# Patient Record
Sex: Female | Born: 1975 | Race: Black or African American | Hispanic: No | Smoking: Never smoker
Health system: Southern US, Community
[De-identification: ages and names within clinical notes are randomized; demographics above are authoritative.]

## PROBLEM LIST (undated history)

## (undated) ENCOUNTER — Inpatient Hospital Stay (HOSPITAL_COMMUNITY): Admission: AD | Payer: Medicare HMO | Source: Home / Self Care | Admitting: Psychiatry

## (undated) DIAGNOSIS — R1310 Dysphagia, unspecified: Secondary | ICD-10-CM

## (undated) DIAGNOSIS — E119 Type 2 diabetes mellitus without complications: Secondary | ICD-10-CM

## (undated) DIAGNOSIS — F319 Bipolar disorder, unspecified: Secondary | ICD-10-CM

## (undated) DIAGNOSIS — K219 Gastro-esophageal reflux disease without esophagitis: Secondary | ICD-10-CM

## (undated) DIAGNOSIS — IMO0002 Reserved for concepts with insufficient information to code with codable children: Secondary | ICD-10-CM

## (undated) DIAGNOSIS — R7303 Prediabetes: Secondary | ICD-10-CM

## (undated) DIAGNOSIS — F419 Anxiety disorder, unspecified: Secondary | ICD-10-CM

## (undated) DIAGNOSIS — R87619 Unspecified abnormal cytological findings in specimens from cervix uteri: Secondary | ICD-10-CM

## (undated) DIAGNOSIS — F209 Schizophrenia, unspecified: Secondary | ICD-10-CM

## (undated) DIAGNOSIS — M1990 Unspecified osteoarthritis, unspecified site: Secondary | ICD-10-CM

## (undated) HISTORY — DX: Bipolar disorder, unspecified: F31.9

## (undated) HISTORY — DX: Unspecified abnormal cytological findings in specimens from cervix uteri: R87.619

## (undated) HISTORY — DX: Schizophrenia, unspecified: F20.9

## (undated) HISTORY — DX: Dysphagia, unspecified: R13.10

## (undated) HISTORY — DX: Gastro-esophageal reflux disease without esophagitis: K21.9

## (undated) HISTORY — DX: Prediabetes: R73.03

## (undated) HISTORY — DX: Reserved for concepts with insufficient information to code with codable children: IMO0002

## (undated) HISTORY — DX: Type 2 diabetes mellitus without complications: E11.9

## (undated) HISTORY — DX: Anxiety disorder, unspecified: F41.9

## (undated) HISTORY — DX: Unspecified osteoarthritis, unspecified site: M19.90

---

## 1998-12-13 ENCOUNTER — Emergency Department (HOSPITAL_COMMUNITY): Admission: EM | Admit: 1998-12-13 | Discharge: 1998-12-14 | Payer: Self-pay | Admitting: Emergency Medicine

## 1998-12-14 ENCOUNTER — Encounter: Payer: Self-pay | Admitting: Emergency Medicine

## 1999-02-24 ENCOUNTER — Inpatient Hospital Stay (HOSPITAL_COMMUNITY): Admission: EM | Admit: 1999-02-24 | Discharge: 1999-03-12 | Payer: Self-pay | Admitting: *Deleted

## 1999-03-13 ENCOUNTER — Other Ambulatory Visit: Admission: RE | Admit: 1999-03-13 | Discharge: 1999-04-10 | Payer: Self-pay

## 2000-12-24 ENCOUNTER — Encounter: Payer: Self-pay | Admitting: *Deleted

## 2000-12-24 ENCOUNTER — Encounter: Admission: RE | Admit: 2000-12-24 | Discharge: 2000-12-24 | Payer: Self-pay | Admitting: *Deleted

## 2001-01-06 ENCOUNTER — Inpatient Hospital Stay (HOSPITAL_COMMUNITY): Admission: EM | Admit: 2001-01-06 | Discharge: 2001-01-09 | Payer: Self-pay | Admitting: *Deleted

## 2001-01-06 ENCOUNTER — Encounter: Payer: Self-pay | Admitting: Emergency Medicine

## 2001-01-10 ENCOUNTER — Other Ambulatory Visit (HOSPITAL_COMMUNITY): Admission: RE | Admit: 2001-01-10 | Discharge: 2001-01-24 | Payer: Self-pay | Admitting: Psychiatry

## 2001-01-14 ENCOUNTER — Ambulatory Visit (HOSPITAL_COMMUNITY): Admission: RE | Admit: 2001-01-14 | Discharge: 2001-01-14 | Payer: Self-pay | Admitting: Family Medicine

## 2001-01-19 ENCOUNTER — Encounter: Admission: RE | Admit: 2001-01-19 | Discharge: 2001-04-19 | Payer: Self-pay

## 2001-02-03 ENCOUNTER — Encounter: Admission: RE | Admit: 2001-02-03 | Discharge: 2001-02-03 | Payer: Self-pay | Admitting: *Deleted

## 2001-02-18 ENCOUNTER — Encounter: Admission: RE | Admit: 2001-02-18 | Discharge: 2001-02-18 | Payer: Self-pay | Admitting: *Deleted

## 2001-02-25 ENCOUNTER — Encounter: Admission: RE | Admit: 2001-02-25 | Discharge: 2001-02-25 | Payer: Self-pay | Admitting: *Deleted

## 2001-03-07 ENCOUNTER — Encounter: Admission: RE | Admit: 2001-03-07 | Discharge: 2001-03-07 | Payer: Self-pay | Admitting: *Deleted

## 2001-03-08 ENCOUNTER — Encounter: Admission: RE | Admit: 2001-03-08 | Discharge: 2001-03-08 | Payer: Self-pay | Admitting: *Deleted

## 2001-03-22 ENCOUNTER — Encounter: Admission: RE | Admit: 2001-03-22 | Discharge: 2001-03-22 | Payer: Self-pay | Admitting: *Deleted

## 2001-03-29 ENCOUNTER — Encounter: Admission: RE | Admit: 2001-03-29 | Discharge: 2001-03-29 | Payer: Self-pay | Admitting: *Deleted

## 2001-04-06 ENCOUNTER — Encounter: Admission: RE | Admit: 2001-04-06 | Discharge: 2001-04-06 | Payer: Self-pay | Admitting: *Deleted

## 2001-04-18 ENCOUNTER — Encounter: Admission: RE | Admit: 2001-04-18 | Discharge: 2001-04-18 | Payer: Self-pay | Admitting: *Deleted

## 2001-04-21 ENCOUNTER — Encounter: Admission: RE | Admit: 2001-04-21 | Discharge: 2001-04-21 | Payer: Self-pay | Admitting: *Deleted

## 2001-04-26 ENCOUNTER — Encounter: Admission: RE | Admit: 2001-04-26 | Discharge: 2001-04-26 | Payer: Self-pay | Admitting: *Deleted

## 2001-05-04 ENCOUNTER — Ambulatory Visit (HOSPITAL_COMMUNITY): Admission: RE | Admit: 2001-05-04 | Discharge: 2001-05-04 | Payer: Self-pay | Admitting: *Deleted

## 2001-05-11 ENCOUNTER — Encounter: Admission: RE | Admit: 2001-05-11 | Discharge: 2001-05-11 | Payer: Self-pay | Admitting: *Deleted

## 2001-05-19 ENCOUNTER — Encounter: Admission: RE | Admit: 2001-05-19 | Discharge: 2001-05-19 | Payer: Self-pay | Admitting: *Deleted

## 2001-05-31 ENCOUNTER — Encounter: Admission: RE | Admit: 2001-05-31 | Discharge: 2001-05-31 | Payer: Self-pay | Admitting: *Deleted

## 2001-06-03 ENCOUNTER — Encounter: Admission: RE | Admit: 2001-06-03 | Discharge: 2001-06-03 | Payer: Self-pay | Admitting: *Deleted

## 2001-06-21 ENCOUNTER — Encounter: Admission: RE | Admit: 2001-06-21 | Discharge: 2001-06-21 | Payer: Self-pay | Admitting: *Deleted

## 2001-06-22 ENCOUNTER — Inpatient Hospital Stay (HOSPITAL_COMMUNITY): Admission: EM | Admit: 2001-06-22 | Discharge: 2001-06-29 | Payer: Self-pay | Admitting: Psychiatry

## 2001-06-30 ENCOUNTER — Other Ambulatory Visit (HOSPITAL_COMMUNITY): Admission: RE | Admit: 2001-06-30 | Discharge: 2001-07-08 | Payer: Self-pay | Admitting: Psychiatry

## 2001-07-14 ENCOUNTER — Encounter: Admission: RE | Admit: 2001-07-14 | Discharge: 2001-07-14 | Payer: Self-pay | Admitting: Psychiatry

## 2001-07-22 ENCOUNTER — Encounter: Admission: RE | Admit: 2001-07-22 | Discharge: 2001-07-22 | Payer: Self-pay | Admitting: Psychiatry

## 2001-08-16 ENCOUNTER — Encounter: Admission: RE | Admit: 2001-08-16 | Discharge: 2001-08-16 | Payer: Self-pay | Admitting: Psychiatry

## 2001-08-19 ENCOUNTER — Encounter: Admission: RE | Admit: 2001-08-19 | Discharge: 2001-08-19 | Payer: Self-pay | Admitting: Psychiatry

## 2001-08-30 ENCOUNTER — Encounter: Admission: RE | Admit: 2001-08-30 | Discharge: 2001-08-31 | Payer: Self-pay

## 2001-11-21 ENCOUNTER — Encounter: Admission: RE | Admit: 2001-11-21 | Discharge: 2001-11-21 | Payer: Self-pay | Admitting: Psychiatry

## 2001-12-19 ENCOUNTER — Encounter: Admission: RE | Admit: 2001-12-19 | Discharge: 2001-12-19 | Payer: Self-pay | Admitting: Psychiatry

## 2001-12-22 ENCOUNTER — Encounter: Admission: RE | Admit: 2001-12-22 | Discharge: 2001-12-22 | Payer: Self-pay | Admitting: Psychiatry

## 2002-01-23 ENCOUNTER — Encounter: Admission: RE | Admit: 2002-01-23 | Discharge: 2002-01-23 | Payer: Self-pay | Admitting: Psychiatry

## 2002-03-01 ENCOUNTER — Encounter: Admission: RE | Admit: 2002-03-01 | Discharge: 2002-03-01 | Payer: Self-pay | Admitting: *Deleted

## 2003-02-22 ENCOUNTER — Other Ambulatory Visit: Admission: RE | Admit: 2003-02-22 | Discharge: 2003-02-22 | Payer: Self-pay | Admitting: Family Medicine

## 2003-03-17 DIAGNOSIS — R87612 Low grade squamous intraepithelial lesion on cytologic smear of cervix (LGSIL): Secondary | ICD-10-CM | POA: Insufficient documentation

## 2003-08-02 ENCOUNTER — Other Ambulatory Visit: Admission: RE | Admit: 2003-08-02 | Discharge: 2003-08-02 | Payer: Self-pay | Admitting: Obstetrics and Gynecology

## 2003-10-25 ENCOUNTER — Other Ambulatory Visit: Admission: RE | Admit: 2003-10-25 | Discharge: 2003-10-25 | Payer: Self-pay | Admitting: Obstetrics and Gynecology

## 2004-02-14 ENCOUNTER — Other Ambulatory Visit: Admission: RE | Admit: 2004-02-14 | Discharge: 2004-02-14 | Payer: Self-pay | Admitting: Obstetrics and Gynecology

## 2004-07-23 ENCOUNTER — Other Ambulatory Visit: Admission: RE | Admit: 2004-07-23 | Discharge: 2004-07-23 | Payer: Self-pay | Admitting: Obstetrics and Gynecology

## 2005-10-14 ENCOUNTER — Other Ambulatory Visit: Admission: RE | Admit: 2005-10-14 | Discharge: 2005-10-14 | Payer: Self-pay | Admitting: Obstetrics and Gynecology

## 2009-03-13 ENCOUNTER — Ambulatory Visit (HOSPITAL_COMMUNITY): Admission: RE | Admit: 2009-03-13 | Discharge: 2009-03-13 | Payer: Self-pay | Admitting: Psychiatry

## 2009-03-22 DIAGNOSIS — K219 Gastro-esophageal reflux disease without esophagitis: Secondary | ICD-10-CM | POA: Insufficient documentation

## 2009-03-22 DIAGNOSIS — F319 Bipolar disorder, unspecified: Secondary | ICD-10-CM | POA: Insufficient documentation

## 2010-08-01 NOTE — H&P (Signed)
[facility]  Patient:    Brenda Hernandez, Brenda Hernandez Visit Number: [id number] MRN: [id number]          Service Type: PSY Location: 400 [id number] 01 Attending Physician:  Jeanice Lim Dictated by:   Young Berry Scott, N.P. Admit Date:  06/22/2001                     Psychiatric Admission Assessment  DATE OF ADMISSION:  June 22, 2001.  IDENTIFYING INFORMATION:  This is a 35 year old African-American female who is a voluntary admission.  HISTORY OF THE PRESENT ILLNESS:  This patient was referred by her psychiatrist for disorganized and hyperverbal speech with increased physical activity, after outpatient treatment was ineffective in controlling her symptoms.  The patient apparently had an episode of depression, as reported by her parents, around mid-March, followed by escalation into mania around April 1, that had increasing difficulty over the past 10 days redirecting the patients behavior.  She has had symptoms of flight of ideas, hyperverbal speech, and has been pacing at home.  Today she states "I have to get out of here to meet with the President of [facility]," and "I cant eat any food here because I have GERD."  The patients history is primarily taken from the record and from  her past medical records here, and from her father who is available for some information.  PAST PSYCHIATRIC HISTORY:  The patient is currently followed by Dr. Lourdes Sledge and Dr. Ladona Ridgel, and by Abel Presto in the [facility].  This is her third admission to [facility].  The patient reports a history of bipolar illness, diagnosed while she was at [employer], in her 3rd year, after some emotional traumas which are unclear.  SOCIAL HISTORY:  The patient was educated at [employer], also attended [employer] for 3 years.  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  She lives with her parents, Victorino Dike  and Electa Sniff, at home.  The patient has a baccalaureate in psychology and was valedictorian of her class.  FAMILY HISTORY:  Remarkable for an aunt with bipolar illness.  ALCOHOL AND DRUG HISTORY:  Unclear.  Family denied any substance abuse on admission and in the past drug screens have been negative.  PAST MEDICAL HISTORY:  The patient is followed by Dr. Parke Simmers, who is her primary care physician.  Medical problems include GERD, and the patient reports a history of dysphagia with some difficulty of swallowing pills and requiring liquid or granulated medications.  MEDICATIONS:  Depakene liquid 250 mg q.a.m., 250 mg q.3p., and 1000 mg at h.s., Prevacid granules 15 mg daily, and Zyprexa Zydis 15 mg at h.s.  The patient was also taking some Seroquel and Xanax apparently at home, and that is unclear at this time.  DRUG ALLERGIES:  None.  POSITIVE PHYSICAL FINDINGS:  The patients review of systems is remarkable for some complaints of discomfort and stiffness in her ankles and feet.  There is some history of her pacing and walking quite a bit over the past 10 days, and she reports a history of shin splints.  She also has had some difficulty swallowing medications and at times complains of inability to eat the food here because she has a history of GERD.  Her intake has been variable while here.  PHYSICAL EXAMINATION:  This is a healthy-appearing African-American female who is of medium build.  She is 5 feet 4 inches  tall, weighs 130 pounds.  Vital signs:  Temp 98.4, pulse 104, respirations 16, blood pressure 137/81.  She is relaxed and cooperative for the exam.  HEAD:  Normocephalic.  EENT:  PERRLA.  Hearing is intact to normal voice.  No rhinorrhea.  Oropharynx:  The patient will not allow detailed exam but there is no breath odor.  She has no complaints.  NECK:  Supple, with no evidence of thyromegaly.  Difficult to palpate any nodes.  The patient will not allow much  exam.  CARDIOVASCULAR:  S1 and S2 are heard.  No clicks, murmurs, or gallops. Regular rate.  LUNGS:  clear to auscultation.  ABDOMEN:  Soft, with no guarding, no evidence of tenderness, no masses appreciated.  GENITALIA:  Deferred.  BREAST EXAM:  Deferred.  MUSCULOSKELETAL:  Strength is 5/5 throughout.  Gait is normal.  Motor movements are smooth.  No evidence of inflammation or tenderness at any joints.  NEURO:  The patient will not cooperate with a full neuro exam.  Gait is grossly normal.  Motor movements are smooth without tremor.  Sensory seems grossly intact.  Her balance is good.  She will not cooperate with a Romberg or sit still for reflexes.  MENTAL STATUS EXAMINATION:  This is a healthy-appearing African-American female, who is fully alert.  She is attempting to cooperate but is unable to stay still.  She paces slowly and shows some evidence of motor retardation. Her speech is generally soft in tone and is hyperverbal in nature.  Mood is guarded and suspicious.  Thought process is fragmented, with some grandiose thoughts.  She is quite disorganized, having flight of ideas.  Some mild paranoia but seems to be transient.  Her mood varies a bit, being guarded one minute and then is more relaxed and open the next.  She does not seem to be overtly suicidal or homicidal, does not appear to be responding to any hallucinations.  Cognitively, she is intact to person or place.  She is confused about time of day or date.  Her judgment and impulse control are impaired.  Insight is poor.  Intelligence is above average.  ADMISSION DIAGNOSES: Axis I:    Bipolar, manic. Axis II:   None. Axis III:  Dysphagia secondary to gastroesophageal reflux disease. Axis IV:   Deferred at this time.  Supportive parents are definitely an            asset to her. Axis V:    Current 29, past year 80.  INITIAL PLAN OF CARE:  Voluntarily admit the patient to stabilize her mood, with q.15 minute  checks in place, and she is under close observation on the 400 unit.  We have elected to give her Zydis 15 mg p.o. q.h.s., which started  a couple of days ago in outpatient clinic, and we will continue those other meds also:  Depakene liquid 250 mg q.a.m., 3 p.m., and 1000 mg at h.s., Prevacid granules daily 15 mg.  Her UA and urine pregnancy test are currently pending, and we will request her outpatient record to check the last time she had a thyroid panel done, since she has does have some mild tachycardia at 104.  The patients last Depakote level was 92.2, and we will repeat a Depakote level in two days.  ESTIMATED LENGTH OF STAY:  Six days. Dictated by:   Young Berry Scott, N.P. Attending Physician:  Jeanice Lim DD:  06/23/01 TD:  06/23/01 Job: [id number]

## 2010-08-01 NOTE — H&P (Signed)
[facility]  Patient:    Brenda Hernandez, Brenda Hernandez Visit Number: [id number] MRN: [id number]          Service Type: PSY Location: 400 [id number] 02 Attending Physician:  Jasmine Pang Dictated by:   Candi Leash. Orsini, N.P. Admit Date:  01/06/2001                     Psychiatric Admission Assessment  DATE OF ADMISSION:  January 06, 2001  PATIENT IDENTIFICATION:  This is a 35 year old single African-American female voluntarily admitted on January 06, 2001, for delusions and paranoia.  HISTORY OF PRESENT ILLNESS:  The patient presents with a history of inability to swallow.  The patient has lost 25-30 pounds as she states she is unable to eat or drink.  The patient also reports that her heart has been racing with associated chest pain.  She has had an extensive workup with a normal EKG. The patient also feels that she has HIV although her reports are negative. She is uncertain about taking medications as they will stick in her throat. She denies any depression and anxiety, denies any suicidal or homicidal ideations.  She sleeps well with Ambien but she feels that it is "too addicting."  She has lost over 25 pounds since May, has been decompensating for the past two to three weeks.  She denies any psychotic symptoms, denies any paranoid ideations.  She does promise safety.  She reports no significant stressors.  PAST PSYCHIATRIC HISTORY:  This is the second hospitalization to [facility].  She sees Dr. Elna Breslow, has been his patient for the past four to six months.  SUBSTANCE ABUSE HISTORY:  She is a nonsmoker, nondrinker, denies any substance abuse.  PAST MEDICAL HISTORY:  Primary care Zena Vitelli: Dr. Parke Simmers.  Medical problems: GERD.  MEDICATIONS PRIOR TO ADMISSION: 1. Claritin. 2. Effexor XR 37.5 mg. 3. Ambien 5 mg at h.s.  DRUG ALLERGIES:  No known allergies.  PHYSICAL EXAMINATION:  VITAL SIGNS:  Temperature 99.4, pulse 120, respirations  18, blood pressure 148/79.  GENERAL:  The patient presents as a thin African-American female.  Weight is 100 pounds.  She is 5 feet 4 inches.  The patient is a 35 year old in no acute distress.  She appears her stated age.  She is well-groomed, alert, and cooperative.  HEENT:  Head is normocephalic.   Hair is short, black, and equally distributed.  External ear canals are patent.  No sinus tenderness.  Hearing is appropriate to conversation.  Mouth: Mucosa is moist.  She has good dentition.  No lesions were seen.  NECK:  Supple.  There is no JVD, negative lymphadenopathy.  Thyroid is nonpalpable and nontender.  Trachea is midline.  CHEST:  Clear to auscultation, no adventitious sounds, no cough.  CARDIOVASCULAR:  The patient is tachycardic at a heart rate of 110 with no murmurs, gallops, or rubs.  BREAST:  Exam was deferred.  ABDOMEN:  Soft, nontender abdomen, no CVA tenderness.  MUSCULOSKELETAL:  No joint swelling or deformity.  Good range of motion.  No sign of injury.  SKIN:  Warm and dry with good turgor.  Nail beds are pink with good capillary refill.  NEUROLOGIC:  Oriented x 3.  Gait is slow but normal.  LABORATORY DATA:  RBC is low at 3.83.  Urine pregnancy test was negative. Urine drug screen was negative.  Streptococcus screen was negative.  SOCIAL HISTORY:  She is a 35 year old single African-American female with no children.  She lives with her parents.  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  She has completed college at [employer].  FAMILY HISTORY:  Aunt with bipolar.  MENTAL STATUS EXAMINATION:  She is an alert, young African-American female. She is cooperative and neat, pleasant, good eye contact.  She is suspicious and has psychomotor retardation.  Speech is soft spoken, slow, and hesitant. Mood is pleasant.  Affect is depressed with mild anxiety.  Thought processes: Positive delusions, positive paranoia.  There is some prominent  thought process of her medications and how they will affect her.  No auditory or visual hallucinations, no suicidal or homicidal ideations, no flight of ideas. Cognitive: Intact.  Memory is fair.  Decreased concentration.  Judgment is poor.  Insight is poor.  ADMISSION DIAGNOSES: Axis I:    Bipolar disorder, mixed with psychotic features. Axis II:   Deferred. Axis III:  None Axis IV:   Deferred. Axis V:    Current is 30, estimated this past year is 65-70.  INITIAL PLAN OF CARE:  Plan is a voluntary admission to [facility] for psychosis, delusions, paranoia.  Contract for safety.  Check every 15 minutes.  Will initiate Depakote liquid Zyprexa Zydis, and Ativan on a scheduled doses.  Will check labs, her Depakote level, and electrolytes.  Will monitor her nutrition and put the patient on I&O.  Will obtain an EKG from Dr. Parke Simmers with the patients permission.  Our goal is to stabilize mood and thinking, to follow up with Dr. Katrinka Blazing, and for the patient to be medication compliant.  ESTIMATED LENGTH OF STAY:  Five days or more depending on the patients response to medications. Dictated by:   Candi Leash. Orsini, N.P. Attending Physician:  Jasmine Pang DD:  01/07/01 TD:  01/07/01 Job: [id number]

## 2010-08-01 NOTE — Discharge Summary (Signed)
[facility]  Patient:    Brenda Hernandez, Brenda Hernandez Visit Number: [id number] MRN: [id number]          Service Type: PSY Location: PIOP Attending Physician:  Benjaman Pott Dictated by:   Jeanice Lim, M.D. Admit Date:  01/10/2001 Discharge Date: 01/24/2001                             Discharge Summary  IDENTIFYING DATA:  This is a 35 year old single African-American female, voluntarily admitted for delusions and paranoia, complaining of difficulty swallowing, having lost 20-25 pounds.  Also complaining of her heart racing, despite a normal EKG, and fear that she may have HIV as well as some other serious medical problem.  ADMISSION MEDICATIONS:  The patient had been prescribed Geodon, Claritin, Effexor XR and Ambien.  ALLERGIES:  No known drug allergies.  MENTAL STATUS EXAMINATION:  The patient was an alert, young African-American female, mostly cooperative, guarded, mildly psychomotor retarded.  Speech was soft spoken and slow and halting.  Mood was suspicious, affect blunted with mild anxiety.  Thought process goal directed.  Thought content positive for somatic delusions, paranoia, believing that her symptoms may have been due to past medications that she had been on, including medicines she took years ago. She was cognitively intact, with poor concentration, and judgment and insight were markedly impaired.  ADMISSION DIAGNOSES: Axis I:    Bipolar disorder, mixed, with psychotic features. Axis II:   None. Axis III:  None, except for recent weight loss. Axis IV:   Mild. Axis V:    35/65-70.  LABS:  Routine admission labs were essentially within normal limits.  EKG was within normal limits.  She was HIV negative, and comprehensive metabolic panel was within normal limits, showing no severe dehydration or electrolyte disturbance.  HOSPITAL COURSE:  The patient was admitted with routine PRN medications.  A meeting was held with her father who  was quite involved, making an effort to be supportive and to assist the patient in communicating her fears regarding treatment and what she was experiencing.  The patient was started Effexor XR 37.5 mg, Ativan 0.5 q.6 p.r.n., Nexium, Rhinall nasal spray.  She was given Ativan 1.0 mg at the time of admission to evaluate the effects.  The patient began talking, no longer appeared to be quite as thought disordered, and ate without difficulty.  Ativan was scheduled at 0.5 t.i.d., Depakene 250 mg now and 500 q.h.s.  Medicines all given in liquid form to be supportive of the patients complaint of difficulty swallowing, and Prevacid 30 mg p.o. b.i.d. liquid.  Zyprexa Zydis 5 mg q.h.s. was started, and patients weight was monitored, as well as her ins and outs.  Effexor XR was discontinued due to hypomanic-like symptoms at times which could be induced by Effexor, and Depakote optimized.  The patient showed a robust response to the medications, reporting no side effects and reported that she was sleeping and eating well, with increased energy and thinking more clearly.  The family was also very pleased with her response, and the aftercare plan that was developed.  CONDITION ON DISCHARGE:  Markedly improved.  Mood was mostly euthymic, affect brighter, thought process goal directed, without delay or hesitation, and thought content was negative for adverse psychotic symptoms.  She still had some beliefs regarding the reasons for her symptoms, including past medications causing them, despite the lack of evidence to support this, but had no suicidal, homicidal,  or violent ideation, and no hallucinations.  Her judgment and insight had improved.  Insight was still limited, although she did understand the importance of the medications and compliance with the followup plan.  She was discharged to follow up with Elna Breslow and a [facility] therapist, Dr. Ledon Snare, initially to start Intensive Outpatient for  close following due to the need for possible medication adjustments.  DISCHARGED WITH PRESCRIPTIONS FOR: 1. Zyprexa Zydis 5 mg q.h.s. 2. Ativan 0.5 mg 1/2 b.i.d. and 1 q.h.s. 3. Depakene syrup 15 ml q.h.s. which is 750 mg total. 4. Prevacid solution 10 ml bid, equivalent to 30 mg.  DISCHARGE DIAGNOSES: Axis I:    Bipolar disorder, mixed, with psychotic features. Axis II:   None. Axis III:  None, except for recent weight loss. Axis IV:   Mild. Axis V:    Global assessment of function on discharge was 50. Dictated by:   Jeanice Lim, M.D. Attending Physician:  Carolanne Grumbling D DD:  02/15/01 TD:  02/16/01 Job: [id number]

## 2010-08-01 NOTE — Discharge Summary (Signed)
[facility]  Patient:    Brenda Hernandez, Brenda Hernandez Visit Number: [id number] MRN: [id number]          Service Type: PSY Location: PIOP Attending Physician:  Benjaman Pott Dictated by:   Jeanice Lim, M.D. Admit Date:  06/30/2001 Disc. Date: 06/29/01                             Discharge Summary  IDENTIFYING DATA:  This is a 35 year old African-American female voluntarily admitted, referred by her psychiatrist, for disorganized thoughts, hyperverbal speech and hyperactivity.  MEDICATIONS:  Depakote, Zyprexa, Xanax, Seroquel, Prilosec.  ALLERGIES:  None.  PHYSICAL EXAMINATION:  Essentially within normal limits.  Neurologically nonfocal.  LABORATORY DATA:  Routine admission labs essentially within normal limits. Depakote level 92.2.  MENTAL STATUS EXAMINATION:  Healthy-appearing African-American female in no acute distress.  Cooperative.  Unable to sit still.  Hyperverbal with mildly pressured speech.  Soft tone.  Mood guarded and suspicious.  Thought process goal directed.  Thought content positive for grandiose thoughts, paranoid ideation, flight of ideas at times and no dangerous ideation.  Cognitively intact.  Judgment and insight limited.  ADMISSION DIAGNOSES: Axis I:    Bipolar disorder, type 1, manic. Axis II:   None. Axis III:  Dysphagia secondary to gastroesophageal reflux disease. Axis IV:   Moderate (problems with primary support). Axis V:    29/65.  HOSPITAL COURSE:  The patient was admitted and ordered routine p.r.n. medications and restarted on previous medications of Prevacid, Zyprexa, Depakene.  The patient was optimized on Zyprexa Zydis for stabilization of mood lability and Depakote level obtained.  Depakote was also titrated as tolerated.  A family session was held which went well.  The parents were considered supportive.  The patient was given liquid medicines due to reporting difficulty swallowing at times, which is a  chronic complaint of hers, and she was discharged to follow up with IOP.  CONDITION ON DISCHARGE:  Markedly improved.  Mood more stable and euthymic. Affect brighter.  Thought process goal directed.  Thought content negative for dangerous ideation or psychotic symptoms.  DISCHARGE MEDICATIONS: 1. Prevacid 15 mg, 5 mg q.a.m. 2. Zyprexa Zydis 10 mg, 2 q.h.s. 3. Depakene 250 mg q.a.m. and 1500 mg q.h.s.  FOLLOW-UP:  IOP downstairs on the day after discharge.  DISCHARGE DIAGNOSES: Axis I:    Bipolar disorder, type 1, manic. Axis II:   None. Axis III:  Dysphagia secondary to gastroesophageal reflux disease. Axis IV:   Moderate (problems with primary support). Axis V:    Global Assessment of Functioning on discharge 50. Dictated by:   Jeanice Lim, M.D. Attending Physician:  Carolanne Grumbling D DD:  07/27/01 TD:  07/28/01 Job: [id number]

## 2011-07-23 ENCOUNTER — Telehealth: Payer: Self-pay | Admitting: Obstetrics and Gynecology

## 2011-07-23 NOTE — Telephone Encounter (Signed)
VPH pt 

## 2011-07-23 NOTE — Telephone Encounter (Signed)
Triage/no return call

## 2011-07-24 NOTE — Telephone Encounter (Signed)
Lm on vm to cb per telephone call.  

## 2011-07-28 ENCOUNTER — Telehealth: Payer: Self-pay | Admitting: Obstetrics and Gynecology

## 2011-07-28 NOTE — Telephone Encounter (Signed)
No response from pt rgdg telephone call. Chart to fb 

## 2011-07-28 NOTE — Telephone Encounter (Signed)
chandra/epic 

## 2011-07-28 NOTE — Telephone Encounter (Signed)
Tc to pt per telephone call. Pt with several ?'s rgdg preconception. Informed pt needs eval to discuss. Pt voices understanding. Pt will call back to sched appt to discuss with vh.

## 2011-11-19 DIAGNOSIS — F429 Obsessive-compulsive disorder, unspecified: Secondary | ICD-10-CM | POA: Insufficient documentation

## 2011-11-23 DIAGNOSIS — I10 Essential (primary) hypertension: Secondary | ICD-10-CM | POA: Insufficient documentation

## 2011-11-23 DIAGNOSIS — E8881 Metabolic syndrome: Secondary | ICD-10-CM | POA: Insufficient documentation

## 2012-01-15 ENCOUNTER — Telehealth: Payer: Self-pay | Admitting: Obstetrics and Gynecology

## 2012-05-04 ENCOUNTER — Encounter: Payer: Self-pay | Admitting: Obstetrics and Gynecology

## 2012-05-04 ENCOUNTER — Ambulatory Visit: Payer: Medicare HMO | Admitting: Obstetrics and Gynecology

## 2012-05-04 VITALS — BP 110/72 | HR 112 | Ht 63.0 in | Wt 248.0 lb

## 2012-05-04 DIAGNOSIS — F319 Bipolar disorder, unspecified: Secondary | ICD-10-CM

## 2012-05-04 DIAGNOSIS — Z124 Encounter for screening for malignant neoplasm of cervix: Secondary | ICD-10-CM

## 2012-05-04 DIAGNOSIS — Z01419 Encounter for gynecological examination (general) (routine) without abnormal findings: Secondary | ICD-10-CM

## 2012-05-04 DIAGNOSIS — N912 Amenorrhea, unspecified: Secondary | ICD-10-CM

## 2012-05-04 LAB — POCT URINE PREGNANCY: Preg Test, Ur: NEGATIVE

## 2012-05-04 MED ORDER — LEVONORGESTREL-ETHINYL ESTRAD 0.1-20 MG-MCG PO TABS: 1.0000 | ORAL_TABLET | Freq: Every day | ORAL | Status: DC

## 2012-05-04 NOTE — Progress Notes (Signed)
Subjective:  Last Pap: 02/10/11 WNL: No ASCUS Regular Periods:no Contraception: none   Monthly Breast exam:no Tetanus<45yrs:yes Nl.Bladder Function:yes Daily BMs:yes Healthy Diet:no Calcium:no Mammogram:no Date of Mammogram: n/a Exercise:no Have often Exercise: n/a Seatbelt: yes Abuse at home: no Stressful work:no Sigmoid-colonoscopy: n/a Bone Density: No PCP: Dr. Andi Devon  Change in PMH: hospitalized in Sept for mental health issues. For about 4 weeks Change in FMH:no changes   Brenda Hernandez is a 37 y.o. female, G0P0000, who presents for an annual exam.     History   Social History  . Marital Status: Married    Spouse Name: N/A    Number of Children: N/A  . Years of Education: N/A   Social History Main Topics  . Smoking status: Never Smoker   . Smokeless tobacco: None  . Alcohol Use: No  . Drug Use: No  . Sexually Active: Yes    Birth Control/ Protection: None   Other Topics Concern  . None   Social History Narrative  . None    Menstrual cycle:   LMP: Patient's last menstrual period was 03/30/2012.           Cycle: Maybe in Feb, but not sure.  D/c'd bcps before hospitalization in 11/2011 to try to conceive.  Wants to restart bcps.  The following portions of the patient's history were reviewed and updated as appropriate: allergies, current medications, past family history, past medical history, past social history, past surgical history and problem list.  Review of Systems Pertinent items are noted in HPI. Breast:Negative for breast lump,nipple discharge or nipple retraction Gastrointestinal: Negative for abdominal pain, change in bowel habits or rectal bleeding Urinary: frequency. Nocturia nightly   Objective:    BP 110/72  Pulse 112  Ht 5\' 3"  (1.6 m)  Wt 248 lb (112.492 kg)  BMI 43.94 kg/m2  LMP 03/30/2012    Weight:  Wt Readings from Last 1 Encounters:  05/04/12 248 lb (112.492 kg)          BMI: Body mass index is 43.94  kg/(m^2).  General Appearance: Alert, appropriate appearance for age. No acute distress HEENT: Grossly normal Neck / Thyroid: Supple, no masses, nodes or enlargement Lungs: clear to auscultation bilaterally Back: No CVA tenderness Breast Exam: No masses or nodes.No dimpling, nipple retraction or discharge. Cardiovascular: Regular rate and rhythm. S1, S2, no murmur Gastrointestinal: Soft, non-tender, no masses or organomegaly Pelvic Exam: Vulva and vagina appear normal. Bimanual exam reveals normal uterus and adnexa. Rectovaginal: normal rectal, no masses Lymphatic Exam: Non-palpable nodes in neck, clavicular, axillary, or inguinal regions Skin: no rash or abnormalities Neurologic: Normal gait and speech, no tremor  Psychiatric: Alert and oriented, appropriate affect.   Wet Prep:not applicable Urinalysis:not applicable UPT: Negative   Assessment:    Normal gyn exam Contraceptive management recent exacerbation of bipolar disorder    Plan:    pap smear counseled on use and side effects of OCP's return annually or prn STD screening: declined Contraception:oral contraceptives (estrogen/progesterone)   Dierdre Forth MD

## 2012-05-05 LAB — PAP IG W/ RFLX HPV ASCU

## 2012-05-17 ENCOUNTER — Telehealth: Payer: Self-pay | Admitting: Obstetrics and Gynecology

## 2012-05-17 NOTE — Telephone Encounter (Signed)
Tc to pt per telephone call. Pt informed to start pills the day of spotting. Pt voices understanding.

## 2012-05-18 ENCOUNTER — Telehealth: Payer: Self-pay | Admitting: Obstetrics and Gynecology

## 2012-05-18 NOTE — Telephone Encounter (Signed)
Spoke with pt rgd msg pt wants to know if she can start bc today informed pt can start bc pt voice understanding

## 2014-03-20 ENCOUNTER — Ambulatory Visit (INDEPENDENT_AMBULATORY_CARE_PROVIDER_SITE_OTHER): Payer: Commercial Managed Care - HMO | Admitting: Emergency Medicine

## 2014-03-20 VITALS — BP 142/96 | HR 124 | Temp 98.0°F | Resp 17 | Ht 62.5 in | Wt 267.0 lb

## 2014-03-20 DIAGNOSIS — M79645 Pain in left finger(s): Secondary | ICD-10-CM

## 2014-03-20 NOTE — Progress Notes (Signed)
[facility] [address] [place] [address] [phone]  Date:  03/20/2014   Name:  Brenda Hernandez   DOB:  03/23/1975   MRN:  [id number]  PCP:  Salena Saner., MD    Chief Complaint: Finger Injury   History of Present Illness:  Brenda Hernandez is a 39 y.o. very pleasant female patient who presents with the following:  Has rings on her left hand that are too tight, as she has gained a substantial amount of weight since marriage She has not removed them in 10 years. She is having pain from the tight rings. No improvement with over the counter medications or other home remedies.  Denies other complaint or health concern today.   Patient Active Problem List   Diagnosis Date Noted  . BIPOLAR AFFECTIVE DISORDER 03/22/2009  . GERD 03/22/2009    Past Medical History  Diagnosis Date  . Schizophrenia   . Abnormal pap     History reviewed. No pertinent past surgical history.  History  Substance Use Topics  . Smoking status: Never Smoker   . Smokeless tobacco: Not on file  . Alcohol Use: No    Family History  Problem Relation Age of Onset  . Breast cancer Maternal Aunt   . Breast cancer Paternal Aunt   . Diabetes Maternal Grandmother   . Diabetes Maternal Grandfather   . Diabetes Paternal Grandmother   . Diabetes Paternal Grandfather     Allergies  Allergen Reactions  . Benadryl [Diphenhydramine]   . Citric Acid     Medication list has been reviewed and updated.  Current Outpatient Prescriptions on File Prior to Visit  Medication Sig Dispense Refill  . cloZAPine (CLOZARIL) 100 MG tablet Take 500 mg by mouth daily.    Marland Kitchen levonorgestrel-ethinyl estradiol (AVIANE) 0.1-20 MG-MCG tablet Take 1 tablet by mouth daily. 1 Package 12  . metFORMIN (GLUCOPHAGE) 1000 MG tablet Take 1,000 mg by mouth 2 (two) times daily with a meal.    . valproate (DEPAKENE) 250 MG/5ML syrup Take by mouth at bedtime. 50 ml nightly     No current  facility-administered medications on file prior to visit.    Review of Systems:  As per HPI, otherwise negative.    Physical Examination: Filed Vitals:   03/20/14 1904  BP: 142/96  Pulse: 124  Temp: 98 F (36.7 C)  Resp: 17   Filed Vitals:   03/20/14 1904  Height: 5' 2.5" (1.588 m)  Weight: 267 lb (121.11 kg)   Body mass index is 48.03 kg/(m^2). Ideal Body Weight: Weight in (lb) to have BMI = 25: 138.6   GEN: WDWN, NAD, Non-toxic, Alert & Oriented x 3 HEENT: Atraumatic, Normocephalic.  Ears and Nose: No external deformity. EXTR: No clubbing/cyanosis/edema NEURO: Normal gait.  PSYCH: Normally interactive. Conversant. Not depressed or anxious appearing.  Calm demeanor.  LEFT fourth finger:  Very tight left yellow metal rings   Cap refill normal  Assessment and Plan: Constricting rings on left fourth finger Removed with ring saw atraumatically  Signed,  Ellison Carwin, MD

## 2014-03-21 DIAGNOSIS — F4322 Adjustment disorder with anxiety: Secondary | ICD-10-CM | POA: Diagnosis not present

## 2014-03-28 DIAGNOSIS — F4322 Adjustment disorder with anxiety: Secondary | ICD-10-CM | POA: Diagnosis not present

## 2014-04-04 DIAGNOSIS — F4322 Adjustment disorder with anxiety: Secondary | ICD-10-CM | POA: Diagnosis not present

## 2014-04-08 DIAGNOSIS — Z79899 Other long term (current) drug therapy: Secondary | ICD-10-CM | POA: Diagnosis not present

## 2014-06-13 DIAGNOSIS — A09 Infectious gastroenteritis and colitis, unspecified: Secondary | ICD-10-CM | POA: Diagnosis not present

## 2014-07-18 DIAGNOSIS — F605 Obsessive-compulsive personality disorder: Secondary | ICD-10-CM | POA: Diagnosis not present

## 2014-07-25 DIAGNOSIS — F605 Obsessive-compulsive personality disorder: Secondary | ICD-10-CM | POA: Diagnosis not present

## 2014-08-01 DIAGNOSIS — F605 Obsessive-compulsive personality disorder: Secondary | ICD-10-CM | POA: Diagnosis not present

## 2014-08-04 DIAGNOSIS — Z79899 Other long term (current) drug therapy: Secondary | ICD-10-CM | POA: Diagnosis not present

## 2014-08-04 DIAGNOSIS — F259 Schizoaffective disorder, unspecified: Secondary | ICD-10-CM | POA: Diagnosis not present

## 2014-08-08 DIAGNOSIS — F605 Obsessive-compulsive personality disorder: Secondary | ICD-10-CM | POA: Diagnosis not present

## 2014-08-16 DIAGNOSIS — F259 Schizoaffective disorder, unspecified: Secondary | ICD-10-CM | POA: Diagnosis not present

## 2014-09-01 ENCOUNTER — Other Ambulatory Visit: Payer: Self-pay | Admitting: Psychiatry

## 2014-09-01 ENCOUNTER — Other Ambulatory Visit (HOSPITAL_COMMUNITY)
Admission: RE | Admit: 2014-09-01 | Discharge: 2014-09-01 | Disposition: A | Discharge status: Home / Self Care | Payer: Commercial Managed Care - HMO | Source: Ambulatory Visit | Attending: Psychiatry | Admitting: Psychiatry

## 2014-09-01 DIAGNOSIS — F259 Schizoaffective disorder, unspecified: Secondary | ICD-10-CM | POA: Diagnosis not present

## 2014-09-01 LAB — COMPREHENSIVE METABOLIC PANEL
ALK PHOS: 50 U/L (ref 38–126)
ALT: 18 U/L (ref 14–54)
AST: 28 U/L (ref 15–41)
Albumin: 3.6 g/dL (ref 3.5–5.0)
Anion gap: 9 (ref 5–15)
BILIRUBIN TOTAL: 0.4 mg/dL (ref 0.3–1.2)
BUN: 9 mg/dL (ref 6–20)
CHLORIDE: 105 mmol/L (ref 101–111)
CO2: 24 mmol/L (ref 22–32)
Calcium: 9.2 mg/dL (ref 8.9–10.3)
Creatinine, Ser: 0.9 mg/dL (ref 0.44–1.00)
Glucose, Bld: 84 mg/dL (ref 65–99)
Potassium: 4 mmol/L (ref 3.5–5.1)
Sodium: 138 mmol/L (ref 135–145)
Total Protein: 8 g/dL (ref 6.5–8.1)

## 2014-09-01 LAB — CBC WITH DIFFERENTIAL/PLATELET
BASOS ABS: 0 10*3/uL (ref 0.0–0.1)
BASOS PCT: 0 % (ref 0–1)
EOS PCT: 0 % (ref 0–5)
Eosinophils Absolute: 0 10*3/uL (ref 0.0–0.7)
HEMATOCRIT: 41.8 % (ref 36.0–46.0)
Hemoglobin: 14.7 g/dL (ref 12.0–15.0)
LYMPHS PCT: 41 % (ref 12–46)
Lymphs Abs: 3.6 10*3/uL (ref 0.7–4.0)
MCH: 31.6 pg (ref 26.0–34.0)
MCHC: 35.2 g/dL (ref 30.0–36.0)
MCV: 89.9 fL (ref 78.0–100.0)
Monocytes Absolute: 0.4 10*3/uL (ref 0.1–1.0)
Monocytes Relative: 5 % (ref 3–12)
NEUTROS ABS: 4.8 10*3/uL (ref 1.7–7.7)
Neutrophils Relative %: 54 % (ref 43–77)
PLATELETS: 290 10*3/uL (ref 150–400)
RBC: 4.65 MIL/uL (ref 3.87–5.11)
RDW: 15.1 % (ref 11.5–15.5)
WBC: 8.8 10*3/uL (ref 4.0–10.5)

## 2014-09-01 LAB — TSH: TSH: 1.084 u[IU]/mL (ref 0.350–4.500)

## 2014-09-01 LAB — LIPID PANEL
CHOLESTEROL: 184 mg/dL (ref 0–200)
HDL: 65 mg/dL (ref >40)
LDL Cholesterol: 97 mg/dL (ref 0–99)
Total CHOL/HDL Ratio: 2.8 RATIO
Triglycerides: 110 mg/dL (ref <150)
VLDL: 22 mg/dL (ref 0–40)

## 2014-09-01 LAB — VALPROIC ACID LEVEL: VALPROIC ACID LVL: 31 ug/mL — AB (ref 50.0–100.0)

## 2014-09-01 LAB — VITAMIN B12: Vitamin B-12: 307 pg/mL (ref 180–914)

## 2014-09-01 LAB — FOLATE: FOLATE: 40.6 ng/mL (ref >5.9)

## 2014-09-02 ENCOUNTER — Other Ambulatory Visit: Payer: Self-pay | Admitting: Psychiatry

## 2014-09-03 LAB — HEMOGLOBIN A1C
HEMOGLOBIN A1C: 5.7 % — AB (ref 4.8–5.6)
MEAN PLASMA GLUCOSE: 117 mg/dL

## 2014-09-03 LAB — VITAMIN D 25 HYDROXY (VIT D DEFICIENCY, FRACTURES): Vit D, 25-Hydroxy: 41.4 ng/mL (ref 30.0–100.0)

## 2014-09-16 DIAGNOSIS — F605 Obsessive-compulsive personality disorder: Secondary | ICD-10-CM | POA: Diagnosis not present

## 2014-09-23 DIAGNOSIS — F605 Obsessive-compulsive personality disorder: Secondary | ICD-10-CM | POA: Diagnosis not present

## 2014-09-29 ENCOUNTER — Other Ambulatory Visit (HOSPITAL_COMMUNITY)
Admit: 2014-09-29 | Discharge: 2014-09-29 | Disposition: A | Discharge status: Home / Self Care | Payer: Commercial Managed Care - HMO | Attending: Psychiatry | Admitting: Psychiatry

## 2014-09-29 DIAGNOSIS — F259 Schizoaffective disorder, unspecified: Secondary | ICD-10-CM | POA: Insufficient documentation

## 2014-09-29 LAB — CBC WITH DIFFERENTIAL/PLATELET
Basophils Absolute: 0 10*3/uL (ref 0.0–0.1)
Basophils Relative: 0 % (ref 0–1)
EOS ABS: 0 10*3/uL (ref 0.0–0.7)
Eosinophils Relative: 0 % (ref 0–5)
HEMATOCRIT: 37.9 % (ref 36.0–46.0)
Hemoglobin: 12.9 g/dL (ref 12.0–15.0)
Lymphocytes Relative: 41 % (ref 12–46)
Lymphs Abs: 3.5 10*3/uL (ref 0.7–4.0)
MCH: 31.3 pg (ref 26.0–34.0)
MCHC: 34 g/dL (ref 30.0–36.0)
MCV: 92 fL (ref 78.0–100.0)
MONO ABS: 0.6 10*3/uL (ref 0.1–1.0)
MONOS PCT: 7 % (ref 3–12)
Neutro Abs: 4.4 10*3/uL (ref 1.7–7.7)
Neutrophils Relative %: 52 % (ref 43–77)
Platelets: 305 10*3/uL (ref 150–400)
RBC: 4.12 MIL/uL (ref 3.87–5.11)
RDW: 14.9 % (ref 11.5–15.5)
WBC: 8.5 10*3/uL (ref 4.0–10.5)

## 2014-09-30 DIAGNOSIS — F605 Obsessive-compulsive personality disorder: Secondary | ICD-10-CM | POA: Diagnosis not present

## 2014-10-05 DIAGNOSIS — F605 Obsessive-compulsive personality disorder: Secondary | ICD-10-CM | POA: Diagnosis not present

## 2014-10-11 ENCOUNTER — Ambulatory Visit (INDEPENDENT_AMBULATORY_CARE_PROVIDER_SITE_OTHER): Payer: Commercial Managed Care - HMO | Admitting: Family Medicine

## 2014-10-11 ENCOUNTER — Telehealth: Payer: Self-pay | Admitting: *Deleted

## 2014-10-11 VITALS — BP 108/82 | HR 109 | Temp 97.9°F | Resp 18 | Ht 62.5 in | Wt 254.6 lb

## 2014-10-11 DIAGNOSIS — R350 Frequency of micturition: Secondary | ICD-10-CM

## 2014-10-11 DIAGNOSIS — R300 Dysuria: Secondary | ICD-10-CM | POA: Diagnosis not present

## 2014-10-11 DIAGNOSIS — N390 Urinary tract infection, site not specified: Secondary | ICD-10-CM | POA: Diagnosis not present

## 2014-10-11 LAB — POCT UA - MICROSCOPIC ONLY
CASTS, UR, LPF, POC: NEGATIVE
CRYSTALS, UR, HPF, POC: NEGATIVE
Mucus, UA: POSITIVE
YEAST UA: NEGATIVE

## 2014-10-11 LAB — POCT URINALYSIS DIPSTICK
Glucose, UA: NEGATIVE
KETONES UA: 15
Nitrite, UA: NEGATIVE
Protein, UA: 30
RBC UA: NEGATIVE
UROBILINOGEN UA: 0.2
pH, UA: 5.5

## 2014-10-11 MED ORDER — CEPHALEXIN 500 MG PO CAPS: 500.0000 mg | ORAL_CAPSULE | Freq: Two times a day (BID) | ORAL | Status: DC

## 2014-10-11 NOTE — Progress Notes (Signed)
Chief Complaint:  Chief Complaint  Patient presents with  . Dysuria    x 3-4 days  . Urinary Frequency    x 3-4 days  . Other    itchy vaginal area x 3-4days    HPI: Brenda Hernandez is a 39 y.o. female who reports to [facility] today complaining of UTI symptoms She has had some tingling  for 3-4 days, with vaginal itching.  No fevers, chills, nausea, vomiting,  abd pain or back pain  No vaginal discharge Normal pap in 2014, no STI  Past Medical History  Diagnosis Date  . Schizophrenia   . Abnormal pap    History reviewed. No pertinent past surgical history. History   Social History  . Marital Status: Married    Spouse Name: N/A  . Number of Children: N/A  . Years of Education: N/A   Social History Main Topics  . Smoking status: Never Smoker   . Smokeless tobacco: Not on file  . Alcohol Use: No  . Drug Use: No  . Sexual Activity: Yes    Birth Control/ Protection: None, Abstinence   Other Topics Concern  . None   Social History Narrative   Family History  Problem Relation Age of Onset  . Breast cancer Maternal Aunt   . Breast cancer Paternal Aunt   . Diabetes Maternal Grandmother   . Diabetes Maternal Grandfather   . Diabetes Paternal Grandmother   . Diabetes Paternal Grandfather    Allergies  Allergen Reactions  . Benadryl [Diphenhydramine]   . Citric Acid    Prior to Admission medications   Medication Sig Start Date End Date Taking? Authorizing Provider  cloZAPine (CLOZARIL) 100 MG tablet Take 500 mg by mouth daily.   Yes Historical Provider, MD  levonorgestrel-ethinyl estradiol (AVIANE) 0.1-20 MG-MCG tablet Take 1 tablet by mouth daily. 05/04/12  Yes Eldred Manges, MD  metFORMIN (GLUCOPHAGE) 1000 MG tablet Take 1,000 mg by mouth 2 (two) times daily with a meal.   Yes Historical Provider, MD  valproate (DEPAKENE) 250 MG/5ML syrup Take by mouth at bedtime. 50 ml nightly   Yes Historical Provider, MD  Magnesium Citrate (CITRATE OF MAGNESIA) 1.745  GM/30ML SOLN Take by mouth.    Historical Provider, MD  pyridOXINE (VITAMIN B-6) 100 MG tablet Take 100 mg by mouth daily.    Historical Provider, MD     ROS: The patient denies fevers, chills, night sweats, unintentional weight loss, chest pain, palpitations, wheezing, dyspnea on exertion, nausea, vomiting, abdominal pain,  hematuria, melena, numbness, weakness, or tingling.   All other systems have been reviewed and were otherwise negative with the exception of those mentioned in the HPI and as above.    PHYSICAL EXAM: Filed Vitals:   10/11/14 1559  BP: 108/82  Pulse: 109  Temp: 97.9 F (36.6 C)  Resp: 18   Body mass index is 45.8 kg/(m^2).   General: Alert, no acute distress HEENT:  Normocephalic, atraumatic, oropharynx patent. EOMI, PERRLA Cardiovascular:  Regular rate and rhythm, no rubs murmurs or gallops.  No Carotid bruits, radial pulse intact. No pedal edema.  Respiratory: Clear to auscultation bilaterally.  No wheezes, rales, or rhonchi.  No cyanosis, no use of accessory musculature Abdominal: No organomegaly, abdomen is soft and non-tender, positive bowel sounds. No masses. Skin: No rashes. Neurologic: Facial musculature symmetric. Psychiatric: Patient acts appropriately throughout our interaction. Lymphatic: No cervical or submandibular lymphadenopathy Musculoskeletal: Gait intact. No edema, tenderness   LABS: Results for orders placed  or performed in visit on 10/11/14  POCT UA - Microscopic Only  Result Value Ref Range   WBC, Ur, HPF, POC 4-6    RBC, urine, microscopic 0-1    Bacteria, U Microscopic 2+    Mucus, UA positive    Epithelial cells, urine per micros 6-8    Crystals, Ur, HPF, POC neg    Casts, Ur, LPF, POC neg    Yeast, UA neg   POCT urinalysis dipstick  Result Value Ref Range   Color, UA yellow    Clarity, UA sl cloudy    Glucose, UA neg    Bilirubin, UA small    Ketones, UA 15    Spec Grav, UA >=1.030    Blood, UA neg    pH, UA 5.5     Protein, UA 30    Urobilinogen, UA 0.2    Nitrite, UA neg    Leukocytes, UA small (1+) (A) Negative     EKG/XRAY:   Primary read interpreted by Dr. Marin Comment at [facility].   ASSESSMENT/PLAN: Encounter Diagnoses  Name Primary?  . Frequent urination   . Dysuria   . UTI (urinary tract infection), uncomplicated Yes   Keflex BID x 7 days Urine cx pending Fu prn   Gross sideeffects, risk and benefits, and alternatives of medications d/w patient. Patient is aware that all medications have potential sideeffects and we are unable to predict every sideeffect or drug-drug interaction that may occur.  Jermey Closs DO  10/11/2014 4:35 PM

## 2014-10-11 NOTE — Telephone Encounter (Signed)
Patient called wanting Keflex as tablet because she cant swallow pills. Per [facility] on [address] they don't come in tablets and patient could break open capsule and take take the medicine from inside capsule. I spoke with patient and she understood

## 2014-10-11 NOTE — Patient Instructions (Signed)

## 2014-10-12 DIAGNOSIS — F605 Obsessive-compulsive personality disorder: Secondary | ICD-10-CM | POA: Diagnosis not present

## 2014-10-12 LAB — URINE CULTURE: Colony Count: 15000

## 2014-10-17 DIAGNOSIS — F605 Obsessive-compulsive personality disorder: Secondary | ICD-10-CM | POA: Diagnosis not present

## 2014-10-18 ENCOUNTER — Telehealth: Payer: Self-pay | Admitting: Family Medicine

## 2014-10-18 NOTE — Telephone Encounter (Signed)
Spoke with her about urine cx and sxs, feelignbetter, if have recurrent sxs then need to tell PCP and obgyn

## 2014-10-23 DIAGNOSIS — Z1389 Encounter for screening for other disorder: Secondary | ICD-10-CM | POA: Diagnosis not present

## 2014-10-23 DIAGNOSIS — Z0000 Encounter for general adult medical examination without abnormal findings: Secondary | ICD-10-CM | POA: Diagnosis not present

## 2014-10-23 DIAGNOSIS — F250 Schizoaffective disorder, bipolar type: Secondary | ICD-10-CM | POA: Diagnosis not present

## 2014-10-23 DIAGNOSIS — Z79899 Other long term (current) drug therapy: Secondary | ICD-10-CM | POA: Diagnosis not present

## 2014-10-23 DIAGNOSIS — Z6841 Body Mass Index (BMI) 40.0 and over, adult: Secondary | ICD-10-CM | POA: Diagnosis not present

## 2014-10-24 DIAGNOSIS — F605 Obsessive-compulsive personality disorder: Secondary | ICD-10-CM | POA: Diagnosis not present

## 2014-10-27 ENCOUNTER — Other Ambulatory Visit: Payer: Self-pay | Admitting: Psychiatry

## 2014-10-27 ENCOUNTER — Inpatient Hospital Stay (HOSPITAL_COMMUNITY): Admit: 2014-10-27 | Payer: Self-pay

## 2014-10-27 LAB — CBC WITH DIFFERENTIAL/PLATELET
Basophils Absolute: 0 10*3/uL (ref 0.0–0.1)
Basophils Relative: 0 % (ref 0–1)
Eosinophils Absolute: 0 10*3/uL (ref 0.0–0.7)
Eosinophils Relative: 0 % (ref 0–5)
HEMATOCRIT: 39.2 % (ref 36.0–46.0)
Hemoglobin: 13.3 g/dL (ref 12.0–15.0)
LYMPHS ABS: 3.6 10*3/uL (ref 0.7–4.0)
LYMPHS PCT: 47 % — AB (ref 12–46)
MCH: 31.2 pg (ref 26.0–34.0)
MCHC: 33.9 g/dL (ref 30.0–36.0)
MCV: 92 fL (ref 78.0–100.0)
MONOS PCT: 5 % (ref 3–12)
Monocytes Absolute: 0.4 10*3/uL (ref 0.1–1.0)
Neutro Abs: 3.7 10*3/uL (ref 1.7–7.7)
Neutrophils Relative %: 48 % (ref 43–77)
Platelets: 274 10*3/uL (ref 150–400)
RBC: 4.26 MIL/uL (ref 3.87–5.11)
RDW: 14.7 % (ref 11.5–15.5)
WBC: 7.7 10*3/uL (ref 4.0–10.5)

## 2014-10-31 DIAGNOSIS — F605 Obsessive-compulsive personality disorder: Secondary | ICD-10-CM | POA: Diagnosis not present

## 2014-11-01 DIAGNOSIS — N898 Other specified noninflammatory disorders of vagina: Secondary | ICD-10-CM | POA: Diagnosis not present

## 2014-11-01 DIAGNOSIS — N926 Irregular menstruation, unspecified: Secondary | ICD-10-CM | POA: Diagnosis not present

## 2014-11-01 DIAGNOSIS — Z3041 Encounter for surveillance of contraceptive pills: Secondary | ICD-10-CM | POA: Diagnosis not present

## 2014-11-01 DIAGNOSIS — Z01419 Encounter for gynecological examination (general) (routine) without abnormal findings: Secondary | ICD-10-CM | POA: Diagnosis not present

## 2014-11-01 DIAGNOSIS — Z6841 Body Mass Index (BMI) 40.0 and over, adult: Secondary | ICD-10-CM | POA: Diagnosis not present

## 2014-11-01 DIAGNOSIS — F209 Schizophrenia, unspecified: Secondary | ICD-10-CM | POA: Diagnosis not present

## 2014-11-07 DIAGNOSIS — F605 Obsessive-compulsive personality disorder: Secondary | ICD-10-CM | POA: Diagnosis not present

## 2014-11-14 DIAGNOSIS — F605 Obsessive-compulsive personality disorder: Secondary | ICD-10-CM | POA: Diagnosis not present

## 2014-11-21 DIAGNOSIS — F605 Obsessive-compulsive personality disorder: Secondary | ICD-10-CM | POA: Diagnosis not present

## 2014-11-28 DIAGNOSIS — F605 Obsessive-compulsive personality disorder: Secondary | ICD-10-CM | POA: Diagnosis not present

## 2014-12-01 ENCOUNTER — Other Ambulatory Visit (HOSPITAL_COMMUNITY)
Admit: 2014-12-01 | Discharge: 2014-12-01 | Disposition: A | Discharge status: Home / Self Care | Payer: Commercial Managed Care - HMO | Source: Other Acute Inpatient Hospital | Attending: Psychiatry | Admitting: Psychiatry

## 2014-12-01 DIAGNOSIS — F259 Schizoaffective disorder, unspecified: Secondary | ICD-10-CM | POA: Diagnosis not present

## 2014-12-01 LAB — CBC WITH DIFFERENTIAL/PLATELET
Basophils Absolute: 0 10*3/uL (ref 0.0–0.1)
Basophils Relative: 0 %
EOS PCT: 0 %
Eosinophils Absolute: 0 10*3/uL (ref 0.0–0.7)
HCT: 37.7 % (ref 36.0–46.0)
Hemoglobin: 12.5 g/dL (ref 12.0–15.0)
LYMPHS ABS: 3.8 10*3/uL (ref 0.7–4.0)
LYMPHS PCT: 46 %
MCH: 30.6 pg (ref 26.0–34.0)
MCHC: 33.2 g/dL (ref 30.0–36.0)
MCV: 92.2 fL (ref 78.0–100.0)
MONO ABS: 0.5 10*3/uL (ref 0.1–1.0)
Monocytes Relative: 6 %
Neutro Abs: 3.9 10*3/uL (ref 1.7–7.7)
Neutrophils Relative %: 48 %
Platelets: 277 10*3/uL (ref 150–400)
RBC: 4.09 MIL/uL (ref 3.87–5.11)
RDW: 14.8 % (ref 11.5–15.5)
WBC: 8.2 10*3/uL (ref 4.0–10.5)

## 2014-12-05 DIAGNOSIS — F605 Obsessive-compulsive personality disorder: Secondary | ICD-10-CM | POA: Diagnosis not present

## 2014-12-12 DIAGNOSIS — F605 Obsessive-compulsive personality disorder: Secondary | ICD-10-CM | POA: Diagnosis not present

## 2014-12-19 DIAGNOSIS — F605 Obsessive-compulsive personality disorder: Secondary | ICD-10-CM | POA: Diagnosis not present

## 2014-12-26 DIAGNOSIS — F605 Obsessive-compulsive personality disorder: Secondary | ICD-10-CM | POA: Diagnosis not present

## 2015-01-02 DIAGNOSIS — F605 Obsessive-compulsive personality disorder: Secondary | ICD-10-CM | POA: Diagnosis not present

## 2015-01-05 ENCOUNTER — Other Ambulatory Visit (HOSPITAL_COMMUNITY)
Admit: 2015-01-05 | Discharge: 2015-01-05 | Disposition: A | Discharge status: Home / Self Care | Payer: Commercial Managed Care - HMO | Attending: Psychiatry | Admitting: Psychiatry

## 2015-01-05 DIAGNOSIS — F259 Schizoaffective disorder, unspecified: Secondary | ICD-10-CM | POA: Diagnosis not present

## 2015-01-05 LAB — CBC WITH DIFFERENTIAL/PLATELET
Basophils Absolute: 0 10*3/uL (ref 0.0–0.1)
Basophils Relative: 0 %
Eosinophils Absolute: 0 10*3/uL (ref 0.0–0.7)
Eosinophils Relative: 0 %
HEMATOCRIT: 40 % (ref 36.0–46.0)
Hemoglobin: 13.7 g/dL (ref 12.0–15.0)
LYMPHS ABS: 3.1 10*3/uL (ref 0.7–4.0)
Lymphocytes Relative: 39 %
MCH: 31.4 pg (ref 26.0–34.0)
MCHC: 34.3 g/dL (ref 30.0–36.0)
MCV: 91.7 fL (ref 78.0–100.0)
MONOS PCT: 5 %
Monocytes Absolute: 0.4 10*3/uL (ref 0.1–1.0)
NEUTROS ABS: 4.5 10*3/uL (ref 1.7–7.7)
NEUTROS PCT: 56 %
Platelets: 262 10*3/uL (ref 150–400)
RBC: 4.36 MIL/uL (ref 3.87–5.11)
RDW: 14.1 % (ref 11.5–15.5)
WBC: 8 10*3/uL (ref 4.0–10.5)

## 2015-01-09 DIAGNOSIS — F605 Obsessive-compulsive personality disorder: Secondary | ICD-10-CM | POA: Diagnosis not present

## 2015-01-21 DIAGNOSIS — F605 Obsessive-compulsive personality disorder: Secondary | ICD-10-CM | POA: Diagnosis not present

## 2015-01-27 ENCOUNTER — Other Ambulatory Visit (HOSPITAL_COMMUNITY)
Admit: 2015-01-27 | Discharge: 2015-01-27 | Disposition: A | Discharge status: Home / Self Care | Payer: Commercial Managed Care - HMO | Attending: Psychiatry | Admitting: Psychiatry

## 2015-01-27 DIAGNOSIS — F259 Schizoaffective disorder, unspecified: Secondary | ICD-10-CM | POA: Insufficient documentation

## 2015-01-27 LAB — CBC WITH DIFFERENTIAL/PLATELET
BASOS ABS: 0 10*3/uL (ref 0.0–0.1)
Basophils Relative: 0 %
EOS PCT: 0 %
Eosinophils Absolute: 0 10*3/uL (ref 0.0–0.7)
HCT: 35.4 % — ABNORMAL LOW (ref 36.0–46.0)
Hemoglobin: 12.6 g/dL (ref 12.0–15.0)
Lymphocytes Relative: 41 %
Lymphs Abs: 3.4 10*3/uL (ref 0.7–4.0)
MCH: 32 pg (ref 26.0–34.0)
MCHC: 35.6 g/dL (ref 30.0–36.0)
MCV: 89.8 fL (ref 78.0–100.0)
Monocytes Absolute: 0.4 10*3/uL (ref 0.1–1.0)
Monocytes Relative: 5 %
NEUTROS ABS: 4.5 10*3/uL (ref 1.7–7.7)
NEUTROS PCT: 54 %
PLATELETS: 257 10*3/uL (ref 150–400)
RBC: 3.94 MIL/uL (ref 3.87–5.11)
RDW: 14.1 % (ref 11.5–15.5)
WBC: 8.3 10*3/uL (ref 4.0–10.5)

## 2015-01-28 DIAGNOSIS — F605 Obsessive-compulsive personality disorder: Secondary | ICD-10-CM | POA: Diagnosis not present

## 2015-02-04 DIAGNOSIS — F605 Obsessive-compulsive personality disorder: Secondary | ICD-10-CM | POA: Diagnosis not present

## 2015-02-11 DIAGNOSIS — F605 Obsessive-compulsive personality disorder: Secondary | ICD-10-CM | POA: Diagnosis not present

## 2015-02-20 DIAGNOSIS — F605 Obsessive-compulsive personality disorder: Secondary | ICD-10-CM | POA: Diagnosis not present

## 2015-02-21 DIAGNOSIS — F259 Schizoaffective disorder, unspecified: Secondary | ICD-10-CM | POA: Diagnosis not present

## 2015-02-27 DIAGNOSIS — F605 Obsessive-compulsive personality disorder: Secondary | ICD-10-CM | POA: Diagnosis not present

## 2015-02-28 DIAGNOSIS — Z6841 Body Mass Index (BMI) 40.0 and over, adult: Secondary | ICD-10-CM | POA: Diagnosis not present

## 2015-02-28 DIAGNOSIS — R000 Tachycardia, unspecified: Secondary | ICD-10-CM | POA: Diagnosis not present

## 2015-02-28 DIAGNOSIS — Z0000 Encounter for general adult medical examination without abnormal findings: Secondary | ICD-10-CM | POA: Diagnosis not present

## 2015-02-28 DIAGNOSIS — R030 Elevated blood-pressure reading, without diagnosis of hypertension: Secondary | ICD-10-CM | POA: Diagnosis not present

## 2015-02-28 DIAGNOSIS — F3160 Bipolar disorder, current episode mixed, unspecified: Secondary | ICD-10-CM | POA: Diagnosis not present

## 2015-03-02 ENCOUNTER — Other Ambulatory Visit: Payer: Self-pay | Admitting: Psychiatry

## 2015-03-02 ENCOUNTER — Other Ambulatory Visit (HOSPITAL_COMMUNITY): Admit: 2015-03-02 | Payer: Self-pay

## 2015-03-02 LAB — CBC WITH DIFFERENTIAL/PLATELET
Basophils Absolute: 0 10*3/uL (ref 0.0–0.1)
Basophils Relative: 0 %
Eosinophils Absolute: 0 10*3/uL (ref 0.0–0.7)
Eosinophils Relative: 0 %
HEMATOCRIT: 36.7 % (ref 36.0–46.0)
HEMOGLOBIN: 13 g/dL (ref 12.0–15.0)
LYMPHS PCT: 43 %
Lymphs Abs: 3.6 10*3/uL (ref 0.7–4.0)
MCH: 31.9 pg (ref 26.0–34.0)
MCHC: 35.4 g/dL (ref 30.0–36.0)
MCV: 90 fL (ref 78.0–100.0)
Monocytes Absolute: 0.5 10*3/uL (ref 0.1–1.0)
Monocytes Relative: 6 %
NEUTROS PCT: 51 %
Neutro Abs: 4.4 10*3/uL (ref 1.7–7.7)
Platelets: 247 10*3/uL (ref 150–400)
RBC: 4.08 MIL/uL (ref 3.87–5.11)
RDW: 14.4 % (ref 11.5–15.5)
WBC: 8.5 10*3/uL (ref 4.0–10.5)

## 2015-03-06 DIAGNOSIS — F605 Obsessive-compulsive personality disorder: Secondary | ICD-10-CM | POA: Diagnosis not present

## 2015-03-12 DIAGNOSIS — F605 Obsessive-compulsive personality disorder: Secondary | ICD-10-CM | POA: Diagnosis not present

## 2015-04-06 ENCOUNTER — Other Ambulatory Visit: Payer: Self-pay | Admitting: Psychiatry

## 2015-04-06 LAB — CBC WITH DIFFERENTIAL/PLATELET
BASOS PCT: 0 %
Basophils Absolute: 0 10*3/uL (ref 0.0–0.1)
Eosinophils Absolute: 0 10*3/uL (ref 0.0–0.7)
Eosinophils Relative: 0 %
HEMATOCRIT: 38.3 % (ref 36.0–46.0)
HEMOGLOBIN: 13.3 g/dL (ref 12.0–15.0)
LYMPHS PCT: 40 %
Lymphs Abs: 3.1 10*3/uL (ref 0.7–4.0)
MCH: 31.4 pg (ref 26.0–34.0)
MCHC: 34.7 g/dL (ref 30.0–36.0)
MCV: 90.5 fL (ref 78.0–100.0)
Monocytes Absolute: 0.4 10*3/uL (ref 0.1–1.0)
Monocytes Relative: 5 %
NEUTROS PCT: 55 %
Neutro Abs: 4.3 10*3/uL (ref 1.7–7.7)
Platelets: 284 10*3/uL (ref 150–400)
RBC: 4.23 MIL/uL (ref 3.87–5.11)
RDW: 14.5 % (ref 11.5–15.5)
WBC: 7.7 10*3/uL (ref 4.0–10.5)

## 2015-05-11 ENCOUNTER — Other Ambulatory Visit: Payer: Self-pay | Admitting: Psychiatry

## 2015-05-11 LAB — CBC WITH DIFFERENTIAL/PLATELET
Basophils Absolute: 0 10*3/uL (ref 0.0–0.1)
Basophils Relative: 0 %
EOS PCT: 0 %
Eosinophils Absolute: 0 10*3/uL (ref 0.0–0.7)
HEMATOCRIT: 36.5 % (ref 36.0–46.0)
Hemoglobin: 12.9 g/dL (ref 12.0–15.0)
Lymphocytes Relative: 45 %
Lymphs Abs: 2.8 10*3/uL (ref 0.7–4.0)
MCH: 31.9 pg (ref 26.0–34.0)
MCHC: 35.3 g/dL (ref 30.0–36.0)
MCV: 90.1 fL (ref 78.0–100.0)
MONO ABS: 0.5 10*3/uL (ref 0.1–1.0)
MONOS PCT: 7 %
NEUTROS ABS: 3 10*3/uL (ref 1.7–7.7)
Neutrophils Relative %: 48 %
Platelets: 275 10*3/uL (ref 150–400)
RBC: 4.05 MIL/uL (ref 3.87–5.11)
RDW: 14.4 % (ref 11.5–15.5)
WBC: 6.2 10*3/uL (ref 4.0–10.5)

## 2015-06-08 ENCOUNTER — Other Ambulatory Visit: Payer: Self-pay | Admitting: Psychiatry

## 2015-06-08 LAB — CBC WITH DIFFERENTIAL/PLATELET
Basophils Absolute: 0 10*3/uL (ref 0.0–0.1)
Basophils Relative: 0 %
EOS PCT: 0 %
Eosinophils Absolute: 0 10*3/uL (ref 0.0–0.7)
HEMATOCRIT: 38.2 % (ref 36.0–46.0)
Hemoglobin: 12.8 g/dL (ref 12.0–15.0)
Lymphocytes Relative: 41 %
Lymphs Abs: 2.8 10*3/uL (ref 0.7–4.0)
MCH: 30.4 pg (ref 26.0–34.0)
MCHC: 33.5 g/dL (ref 30.0–36.0)
MCV: 90.7 fL (ref 78.0–100.0)
MONO ABS: 0.4 10*3/uL (ref 0.1–1.0)
MONOS PCT: 6 %
Neutro Abs: 3.5 10*3/uL (ref 1.7–7.7)
Neutrophils Relative %: 53 %
PLATELETS: 315 10*3/uL (ref 150–400)
RBC: 4.21 MIL/uL (ref 3.87–5.11)
RDW: 14.5 % (ref 11.5–15.5)
WBC: 6.7 10*3/uL (ref 4.0–10.5)

## 2015-07-02 ENCOUNTER — Ambulatory Visit (INDEPENDENT_AMBULATORY_CARE_PROVIDER_SITE_OTHER): Payer: Commercial Managed Care - HMO | Admitting: Family Medicine

## 2015-07-02 VITALS — BP 106/68 | HR 103 | Temp 97.8°F | Resp 18 | Ht 63.0 in | Wt 249.0 lb

## 2015-07-02 DIAGNOSIS — A084 Viral intestinal infection, unspecified: Secondary | ICD-10-CM | POA: Diagnosis not present

## 2015-07-02 DIAGNOSIS — A09 Infectious gastroenteritis and colitis, unspecified: Secondary | ICD-10-CM

## 2015-07-02 DIAGNOSIS — R112 Nausea with vomiting, unspecified: Secondary | ICD-10-CM

## 2015-07-02 DIAGNOSIS — E669 Obesity, unspecified: Secondary | ICD-10-CM | POA: Diagnosis not present

## 2015-07-02 MED ORDER — ONDANSETRON 4 MG PO TBDP: ORAL_TABLET | ORAL | Status: DC

## 2015-07-02 NOTE — Patient Instructions (Addendum)
Drink plenty of fluids and get enough rest. Avoid dairy products, alcohol, and fried fatty foods for a few days.  Take Imodium(loperamide) over-the-counter as directed on the bottle as needed for diarrhea  Take the Zofran (ondansetron) one every 6 or 8 hours as needed for nausea or vomiting  In the event of acute abdominal pain or complications go to the emergency room  Return at anytime if needed  I'm proud of you losing some weight, and hope that he will continue to keep your focus to losing more.  Viral Gastroenteritis Viral gastroenteritis is also known as stomach flu. This condition affects the stomach and intestinal tract. It can cause sudden diarrhea and vomiting. The illness typically lasts 3 to 8 days. Most people develop an immune response that eventually gets rid of the virus. While this natural response develops, the virus can make you quite ill. CAUSES  Many different viruses can cause gastroenteritis, such as rotavirus or noroviruses. You can catch one of these viruses by consuming contaminated food or water. You may also catch a virus by sharing utensils or other personal items with an infected person or by touching a contaminated surface. SYMPTOMS  The most common symptoms are diarrhea and vomiting. These problems can cause a severe loss of body fluids (dehydration) and a body salt (electrolyte) imbalance. Other symptoms may include:  Fever.  Headache.  Fatigue.  Abdominal pain. DIAGNOSIS  Your caregiver can usually diagnose viral gastroenteritis based on your symptoms and a physical exam. A stool sample may also be taken to test for the presence of viruses or other infections. TREATMENT  This illness typically goes away on its own. Treatments are aimed at rehydration. The most serious cases of viral gastroenteritis involve vomiting so severely that you are not able to keep fluids down. In these cases, fluids must be given through an intravenous line (IV). HOME CARE  INSTRUCTIONS   Drink enough fluids to keep your urine clear or pale yellow. Drink small amounts of fluids frequently and increase the amounts as tolerated.  Ask your caregiver for specific rehydration instructions.  Avoid:  Foods high in sugar.  Alcohol.  Carbonated drinks.  Tobacco.  Juice.  Caffeine drinks.  Extremely hot or cold fluids.  Fatty, greasy foods.  Too much intake of anything at one time.  Dairy products until 24 to 48 hours after diarrhea stops.  You may consume probiotics. Probiotics are active cultures of beneficial bacteria. They may lessen the amount and number of diarrheal stools in adults. Probiotics can be found in yogurt with active cultures and in supplements.  Wash your hands well to avoid spreading the virus.  Only take over-the-counter or prescription medicines for pain, discomfort, or fever as directed by your caregiver. Do not give aspirin to children. Antidiarrheal medicines are not recommended.  Ask your caregiver if you should continue to take your regular prescribed and over-the-counter medicines.  Keep all follow-up appointments as directed by your caregiver. SEEK IMMEDIATE MEDICAL CARE IF:   You are unable to keep fluids down.  You do not urinate at least once every 6 to 8 hours.  You develop shortness of breath.  You notice blood in your stool or vomit. This may look like coffee grounds.  You have abdominal pain that increases or is concentrated in one small area (localized).  You have persistent vomiting or diarrhea.  You have a fever.  The patient is a child younger than 3 months, and he or she has a fever.  The patient is a child older than 3 months, and he or she has a fever and persistent symptoms.  The patient is a child older than 3 months, and he or she has a fever and symptoms suddenly get worse.  The patient is a baby, and he or she has no tears when crying. MAKE SURE YOU:   Understand these  instructions.  Will watch your condition.  Will get help right away if you are not doing well or get worse.   This information is not intended to replace advice given to you by your health care provider. Make sure you discuss any questions you have with your health care provider.   Document Released: 03/02/2005 Document Revised: 05/25/2011 Document Reviewed: 12/17/2010 Elsevier Interactive Patient Education 2016 [employer].     IF you received an x-ray today, you will receive an invoice from [facility]. Please contact [facility] at [phone] with questions or concerns regarding your invoice.   IF you received labwork today, you will receive an invoice from [employer]. Please contact [facility] at [phone] with questions or concerns regarding your invoice.   Our billing staff will not be able to assist you with questions regarding bills from these companies.  You will be contacted with the lab results as soon as they are available. The fastest way to get your results is to activate your My Chart account. Instructions are located on the last page of this paperwork. If you have not heard from [place] regarding the results in 2 weeks, please contact this office.

## 2015-07-02 NOTE — Progress Notes (Signed)
Patient ID: Brenda Hernandez, female    DOB: 1975/12/01  Age: 40 y.o. MRN: [id number]  Chief Complaint  Patient presents with  . Emesis    since sunday  . Nausea  . Dizziness  . Diarrhea    today    Subjective:   Pleasant young lady who started getting sick early yesterday morning. She has had nausea and vomiting all day yesterday, then diarrhea all last night. Both have continued a little bit today. She has been nauseous. She's been a little bit dizzy today. She has been drinking enough liquids that she urinated not long before coming in here. No major abdominal pain.  She is on psychotropic medications for bipolar. She is on disability for that.  She has been trying to watch her intake and lose some weight.  Current allergies, medications, problem list, past/family and social histories reviewed.  Objective:  BP 106/68 mmHg  Pulse 103  Temp(Src) 97.8 F (36.6 C)  Resp 18  Ht 5\' 3"  (1.6 m)  Wt 249 lb (112.946 kg)  BMI 44.12 kg/m2  SpO2 98%  LMP 06/01/2015 (Approximate)  No major acute distress. Obese young lady, pleasant alert and oriented. Accompanied by her mother. Throat clear. Mucous membranes moist. TMs are normal. No carotid bruits. Chest clear to auscultation. Heart rate without murmurs. Abdomen has normal bowel sounds. Soft without mass or tenderness.  Assessment & Plan:   Assessment: 1. Viral gastroenteritis   2. Nausea and vomiting, intractability of vomiting not specified, unspecified vomiting type   3. Diarrhea of infectious origin   4. Obesity       Plan: See instructions. I think this is very consistent with a viral gastroenteritis which I have been seeing a number of cases last few days.  No orders of the defined types were placed in this encounter.    Meds ordered this encounter  Medications  . ondansetron (ZOFRAN ODT) 4 MG disintegrating tablet    Sig: Take 1 every 6 or 8 hours as needed for nausea or vomiting    Dispense:  10 tablet    Refill:  0          Patient Instructions   Drink plenty of fluids and get enough rest. Avoid dairy products, alcohol, and fried fatty foods for a few days.  Take Imodium(loperamide) over-the-counter as directed on the bottle as needed for diarrhea  Take the Zofran (ondansetron) one every 6 or 8 hours as needed for nausea or vomiting  In the event of acute abdominal pain or complications go to the emergency room  Return at anytime if needed  I'm proud of you losing some weight, and hope that he will continue to keep your focus to losing more.  Viral Gastroenteritis Viral gastroenteritis is also known as stomach flu. This condition affects the stomach and intestinal tract. It can cause sudden diarrhea and vomiting. The illness typically lasts 3 to 8 days. Most people develop an immune response that eventually gets rid of the virus. While this natural response develops, the virus can make you quite ill. CAUSES  Many different viruses can cause gastroenteritis, such as rotavirus or noroviruses. You can catch one of these viruses by consuming contaminated food or water. You may also catch a virus by sharing utensils or other personal items with an infected person or by touching a contaminated surface. SYMPTOMS  The most common symptoms are diarrhea and vomiting. These problems can cause a severe loss of body fluids (dehydration) and a body salt (electrolyte)  imbalance. Other symptoms may include:  Fever.  Headache.  Fatigue.  Abdominal pain. DIAGNOSIS  Your caregiver can usually diagnose viral gastroenteritis based on your symptoms and a physical exam. A stool sample may also be taken to test for the presence of viruses or other infections. TREATMENT  This illness typically goes away on its own. Treatments are aimed at rehydration. The most serious cases of viral gastroenteritis involve vomiting so severely that you are not able to keep fluids down. In these cases, fluids must be given through  an intravenous line (IV). HOME CARE INSTRUCTIONS   Drink enough fluids to keep your urine clear or pale yellow. Drink small amounts of fluids frequently and increase the amounts as tolerated.  Ask your caregiver for specific rehydration instructions.  Avoid:  Foods high in sugar.  Alcohol.  Carbonated drinks.  Tobacco.  Juice.  Caffeine drinks.  Extremely hot or cold fluids.  Fatty, greasy foods.  Too much intake of anything at one time.  Dairy products until 24 to 48 hours after diarrhea stops.  You may consume probiotics. Probiotics are active cultures of beneficial bacteria. They may lessen the amount and number of diarrheal stools in adults. Probiotics can be found in yogurt with active cultures and in supplements.  Wash your hands well to avoid spreading the virus.  Only take over-the-counter or prescription medicines for pain, discomfort, or fever as directed by your caregiver. Do not give aspirin to children. Antidiarrheal medicines are not recommended.  Ask your caregiver if you should continue to take your regular prescribed and over-the-counter medicines.  Keep all follow-up appointments as directed by your caregiver. SEEK IMMEDIATE MEDICAL CARE IF:   You are unable to keep fluids down.  You do not urinate at least once every 6 to 8 hours.  You develop shortness of breath.  You notice blood in your stool or vomit. This may look like coffee grounds.  You have abdominal pain that increases or is concentrated in one small area (localized).  You have persistent vomiting or diarrhea.  You have a fever.  The patient is a child younger than 3 months, and he or she has a fever.  The patient is a child older than 3 months, and he or she has a fever and persistent symptoms.  The patient is a child older than 3 months, and he or she has a fever and symptoms suddenly get worse.  The patient is a baby, and he or she has no tears when crying. MAKE SURE YOU:    Understand these instructions.  Will watch your condition.  Will get help right away if you are not doing well or get worse.   This information is not intended to replace advice given to you by your health care provider. Make sure you discuss any questions you have with your health care provider.   Document Released: 03/02/2005 Document Revised: 05/25/2011 Document Reviewed: 12/17/2010 Elsevier Interactive Patient Education 2016 [employer].     IF you received an x-ray today, you will receive an invoice from [facility]. Please contact [facility] at [phone] with questions or concerns regarding your invoice.   IF you received labwork today, you will receive an invoice from [employer]. Please contact [facility] at [phone] with questions or concerns regarding your invoice.   Our billing staff will not be able to assist you with questions regarding bills from these companies.  You will be contacted with the lab results as soon as they  are available. The fastest way to get your results is to activate your My Chart account. Instructions are located on the last page of this paperwork. If you have not heard from [place] regarding the results in 2 weeks, please contact this office.           No Follow-up on file.   Brianna Bennett, MD 07/02/2015

## 2015-07-06 ENCOUNTER — Other Ambulatory Visit: Payer: Self-pay | Admitting: Psychiatry

## 2015-07-06 LAB — CBC WITH DIFFERENTIAL/PLATELET
BASOS ABS: 0.1 10*3/uL (ref 0.0–0.1)
Basophils Relative: 1 %
Eosinophils Absolute: 0 10*3/uL (ref 0.0–0.7)
Eosinophils Relative: 0 %
HCT: 37.5 % (ref 36.0–46.0)
HEMOGLOBIN: 12.4 g/dL (ref 12.0–15.0)
LYMPHS PCT: 57 %
Lymphs Abs: 3.6 10*3/uL (ref 0.7–4.0)
MCH: 29.8 pg (ref 26.0–34.0)
MCHC: 33.1 g/dL (ref 30.0–36.0)
MCV: 90.1 fL (ref 78.0–100.0)
Monocytes Absolute: 0.4 10*3/uL (ref 0.1–1.0)
Monocytes Relative: 7 %
NEUTROS PCT: 35 %
Neutro Abs: 2.2 10*3/uL (ref 1.7–7.7)
Platelets: 277 10*3/uL (ref 150–400)
RBC: 4.16 MIL/uL (ref 3.87–5.11)
RDW: 14.9 % (ref 11.5–15.5)
WBC: 6.3 10*3/uL (ref 4.0–10.5)

## 2015-07-17 DIAGNOSIS — F422 Mixed obsessional thoughts and acts: Secondary | ICD-10-CM | POA: Diagnosis not present

## 2015-07-31 DIAGNOSIS — F422 Mixed obsessional thoughts and acts: Secondary | ICD-10-CM | POA: Diagnosis not present

## 2015-08-03 ENCOUNTER — Other Ambulatory Visit: Payer: Self-pay | Admitting: Psychiatry

## 2015-08-03 LAB — CBC WITH DIFFERENTIAL/PLATELET
Basophils Absolute: 0 10*3/uL (ref 0.0–0.1)
Basophils Relative: 0 %
EOS ABS: 0 10*3/uL (ref 0.0–0.7)
EOS PCT: 0 %
HCT: 37.3 % (ref 36.0–46.0)
Hemoglobin: 12.2 g/dL (ref 12.0–15.0)
LYMPHS ABS: 4.1 10*3/uL — AB (ref 0.7–4.0)
Lymphocytes Relative: 47 %
MCH: 29.6 pg (ref 26.0–34.0)
MCHC: 32.7 g/dL (ref 30.0–36.0)
MCV: 90.5 fL (ref 78.0–100.0)
MONOS PCT: 6 %
Monocytes Absolute: 0.5 10*3/uL (ref 0.1–1.0)
Neutro Abs: 4.1 10*3/uL (ref 1.7–7.7)
Neutrophils Relative %: 47 %
Platelets: 270 10*3/uL (ref 150–400)
RBC: 4.12 MIL/uL (ref 3.87–5.11)
RDW: 15.2 % (ref 11.5–15.5)
WBC: 8.7 10*3/uL (ref 4.0–10.5)

## 2015-08-22 DIAGNOSIS — F259 Schizoaffective disorder, unspecified: Secondary | ICD-10-CM | POA: Diagnosis not present

## 2015-08-31 ENCOUNTER — Other Ambulatory Visit: Payer: Self-pay | Admitting: Psychiatry

## 2015-08-31 LAB — COMPREHENSIVE METABOLIC PANEL
ALBUMIN: 3.2 g/dL — AB (ref 3.5–5.0)
ALT: 11 U/L — ABNORMAL LOW (ref 14–54)
AST: 23 U/L (ref 15–41)
Alkaline Phosphatase: 43 U/L (ref 38–126)
Anion gap: 7 (ref 5–15)
BUN: 11 mg/dL (ref 6–20)
CO2: 23 mmol/L (ref 22–32)
Calcium: 8.8 mg/dL — ABNORMAL LOW (ref 8.9–10.3)
Chloride: 109 mmol/L (ref 101–111)
Creatinine, Ser: 0.84 mg/dL (ref 0.44–1.00)
GFR calc Af Amer: >60 mL/min (ref >60)
GFR calc non Af Amer: >60 mL/min (ref >60)
Glucose, Bld: 83 mg/dL (ref 65–99)
Potassium: 4.3 mmol/L (ref 3.5–5.1)
SODIUM: 139 mmol/L (ref 135–145)
Total Bilirubin: 0.5 mg/dL (ref 0.3–1.2)
Total Protein: 7 g/dL (ref 6.5–8.1)

## 2015-08-31 LAB — LIPID PANEL
CHOL/HDL RATIO: 3.1 ratio
Cholesterol: 169 mg/dL (ref 0–200)
HDL: 54 mg/dL (ref >40)
LDL Cholesterol: 94 mg/dL (ref 0–99)
Triglycerides: 106 mg/dL (ref <150)
VLDL: 21 mg/dL (ref 0–40)

## 2015-08-31 LAB — CBC WITH DIFFERENTIAL/PLATELET
Basophils Absolute: 0 10*3/uL (ref 0.0–0.1)
Basophils Relative: 0 %
EOS PCT: 0 %
Eosinophils Absolute: 0 10*3/uL (ref 0.0–0.7)
HCT: 37.6 % (ref 36.0–46.0)
HEMOGLOBIN: 12.3 g/dL (ref 12.0–15.0)
LYMPHS ABS: 3.1 10*3/uL (ref 0.7–4.0)
Lymphocytes Relative: 43 %
MCH: 29.9 pg (ref 26.0–34.0)
MCHC: 32.7 g/dL (ref 30.0–36.0)
MCV: 91.5 fL (ref 78.0–100.0)
Monocytes Absolute: 0.4 10*3/uL (ref 0.1–1.0)
Monocytes Relative: 6 %
NEUTROS PCT: 51 %
Neutro Abs: 3.6 10*3/uL (ref 1.7–7.7)
Platelets: 218 10*3/uL (ref 150–400)
RBC: 4.11 MIL/uL (ref 3.87–5.11)
RDW: 15.5 % (ref 11.5–15.5)
WBC: 7.1 10*3/uL (ref 4.0–10.5)

## 2015-08-31 LAB — VITAMIN B12: Vitamin B-12: 390 pg/mL (ref 180–914)

## 2015-08-31 LAB — VALPROIC ACID LEVEL: Valproic Acid Lvl: 34 ug/mL — ABNORMAL LOW (ref 50.0–100.0)

## 2015-08-31 LAB — FOLATE: FOLATE: 41.1 ng/mL (ref >5.9)

## 2015-08-31 LAB — TSH: TSH: 1.097 u[IU]/mL (ref 0.350–4.500)

## 2015-09-02 LAB — VITAMIN D 25 HYDROXY (VIT D DEFICIENCY, FRACTURES): VIT D 25 HYDROXY: 40.8 ng/mL (ref 30.0–100.0)

## 2015-09-02 LAB — HEMOGLOBIN A1C
Hgb A1c MFr Bld: 5.2 % (ref 4.8–5.6)
Mean Plasma Glucose: 103 mg/dL

## 2015-09-23 DIAGNOSIS — F422 Mixed obsessional thoughts and acts: Secondary | ICD-10-CM | POA: Diagnosis not present

## 2015-09-28 ENCOUNTER — Other Ambulatory Visit (HOSPITAL_COMMUNITY): Payer: Self-pay | Admitting: Lab

## 2015-09-28 ENCOUNTER — Other Ambulatory Visit (HOSPITAL_COMMUNITY)
Admission: RE | Admit: 2015-09-28 | Discharge: 2015-09-28 | Disposition: A | Discharge status: Home / Self Care | Payer: Commercial Managed Care - HMO | Source: Ambulatory Visit | Attending: Psychiatry | Admitting: Psychiatry

## 2015-09-28 ENCOUNTER — Other Ambulatory Visit: Payer: Self-pay | Admitting: Psychiatry

## 2015-09-28 DIAGNOSIS — Z029 Encounter for administrative examinations, unspecified: Secondary | ICD-10-CM | POA: Insufficient documentation

## 2015-09-28 LAB — CBC WITH DIFFERENTIAL/PLATELET
BASOS ABS: 0 10*3/uL (ref 0.0–0.1)
BASOS PCT: 0 %
EOS ABS: 0 10*3/uL (ref 0.0–0.7)
EOS PCT: 0 %
HEMATOCRIT: 38.6 % (ref 36.0–46.0)
Hemoglobin: 13 g/dL (ref 12.0–15.0)
Lymphocytes Relative: 46 %
Lymphs Abs: 4.2 10*3/uL — ABNORMAL HIGH (ref 0.7–4.0)
MCH: 31.5 pg (ref 26.0–34.0)
MCHC: 33.7 g/dL (ref 30.0–36.0)
MCV: 93.5 fL (ref 78.0–100.0)
MONO ABS: 0.6 10*3/uL (ref 0.1–1.0)
MONOS PCT: 6 %
Neutro Abs: 4.4 10*3/uL (ref 1.7–7.7)
Neutrophils Relative %: 48 %
PLATELETS: 289 10*3/uL (ref 150–400)
RBC: 4.13 MIL/uL (ref 3.87–5.11)
RDW: 14.7 % (ref 11.5–15.5)
WBC: 9.2 10*3/uL (ref 4.0–10.5)

## 2015-10-01 DIAGNOSIS — F422 Mixed obsessional thoughts and acts: Secondary | ICD-10-CM | POA: Diagnosis not present

## 2015-10-15 DIAGNOSIS — F422 Mixed obsessional thoughts and acts: Secondary | ICD-10-CM | POA: Diagnosis not present

## 2015-11-02 ENCOUNTER — Other Ambulatory Visit: Payer: Self-pay | Admitting: Psychiatry

## 2015-11-02 LAB — CBC WITH DIFFERENTIAL/PLATELET
Basophils Absolute: 0 10*3/uL (ref 0.0–0.1)
Basophils Relative: 0 %
Eosinophils Absolute: 0 10*3/uL (ref 0.0–0.7)
Eosinophils Relative: 0 %
HCT: 39.1 % (ref 36.0–46.0)
Hemoglobin: 13.2 g/dL (ref 12.0–15.0)
LYMPHS ABS: 4.4 10*3/uL — AB (ref 0.7–4.0)
Lymphocytes Relative: 49 %
MCH: 31.8 pg (ref 26.0–34.0)
MCHC: 33.8 g/dL (ref 30.0–36.0)
MCV: 94.2 fL (ref 78.0–100.0)
MONO ABS: 0.4 10*3/uL (ref 0.1–1.0)
MONOS PCT: 5 %
Neutro Abs: 4 10*3/uL (ref 1.7–7.7)
Neutrophils Relative %: 46 %
Platelets: 298 10*3/uL (ref 150–400)
RBC: 4.15 MIL/uL (ref 3.87–5.11)
RDW: 14 % (ref 11.5–15.5)
WBC: 8.8 10*3/uL (ref 4.0–10.5)

## 2015-11-12 DIAGNOSIS — N926 Irregular menstruation, unspecified: Secondary | ICD-10-CM | POA: Diagnosis not present

## 2015-11-12 DIAGNOSIS — Z01419 Encounter for gynecological examination (general) (routine) without abnormal findings: Secondary | ICD-10-CM | POA: Diagnosis not present

## 2015-11-12 DIAGNOSIS — Z6841 Body Mass Index (BMI) 40.0 and over, adult: Secondary | ICD-10-CM | POA: Diagnosis not present

## 2015-11-12 DIAGNOSIS — F209 Schizophrenia, unspecified: Secondary | ICD-10-CM | POA: Diagnosis not present

## 2015-11-12 DIAGNOSIS — Z309 Encounter for contraceptive management, unspecified: Secondary | ICD-10-CM | POA: Diagnosis not present

## 2015-11-30 ENCOUNTER — Other Ambulatory Visit: Payer: Self-pay | Admitting: Psychiatry

## 2015-11-30 ENCOUNTER — Other Ambulatory Visit (HOSPITAL_COMMUNITY)
Admit: 2015-11-30 | Discharge: 2015-11-30 | Disposition: A | Discharge status: Home / Self Care | Payer: Commercial Managed Care - HMO | Attending: Psychiatry | Admitting: Psychiatry

## 2015-11-30 DIAGNOSIS — Z029 Encounter for administrative examinations, unspecified: Secondary | ICD-10-CM | POA: Diagnosis not present

## 2015-11-30 LAB — CBC WITH DIFFERENTIAL/PLATELET
BASOS ABS: 0 10*3/uL (ref 0.0–0.1)
BASOS PCT: 0 %
EOS ABS: 0 10*3/uL (ref 0.0–0.7)
Eosinophils Relative: 0 %
HCT: 37.8 % (ref 36.0–46.0)
HEMOGLOBIN: 12.6 g/dL (ref 12.0–15.0)
LYMPHS ABS: 3.3 10*3/uL (ref 0.7–4.0)
Lymphocytes Relative: 40 %
MCH: 31.3 pg (ref 26.0–34.0)
MCHC: 33.3 g/dL (ref 30.0–36.0)
MCV: 93.8 fL (ref 78.0–100.0)
Monocytes Absolute: 0.4 10*3/uL (ref 0.1–1.0)
Monocytes Relative: 5 %
NEUTROS PCT: 55 %
Neutro Abs: 4.6 10*3/uL (ref 1.7–7.7)
PLATELETS: 265 10*3/uL (ref 150–400)
RBC: 4.03 MIL/uL (ref 3.87–5.11)
RDW: 14.2 % (ref 11.5–15.5)
WBC: 8.4 10*3/uL (ref 4.0–10.5)

## 2015-12-03 DIAGNOSIS — F422 Mixed obsessional thoughts and acts: Secondary | ICD-10-CM | POA: Diagnosis not present

## 2015-12-06 ENCOUNTER — Other Ambulatory Visit (HOSPITAL_COMMUNITY): Payer: Self-pay

## 2016-01-04 ENCOUNTER — Other Ambulatory Visit: Payer: Self-pay | Admitting: Psychiatry

## 2016-01-04 LAB — CBC WITH DIFFERENTIAL/PLATELET
BASOS ABS: 0 10*3/uL (ref 0.0–0.1)
Basophils Relative: 0 %
Eosinophils Absolute: 0 10*3/uL (ref 0.0–0.7)
Eosinophils Relative: 0 %
HEMATOCRIT: 39.3 % (ref 36.0–46.0)
HEMOGLOBIN: 13.5 g/dL (ref 12.0–15.0)
LYMPHS PCT: 45 %
Lymphs Abs: 4 10*3/uL (ref 0.7–4.0)
MCH: 31.4 pg (ref 26.0–34.0)
MCHC: 34.4 g/dL (ref 30.0–36.0)
MCV: 91.4 fL (ref 78.0–100.0)
MONO ABS: 0.4 10*3/uL (ref 0.1–1.0)
Monocytes Relative: 5 %
NEUTROS PCT: 50 %
Neutro Abs: 4.4 10*3/uL (ref 1.7–7.7)
Platelets: 255 10*3/uL (ref 150–400)
RBC: 4.3 MIL/uL (ref 3.87–5.11)
RDW: 15 % (ref 11.5–15.5)
WBC: 8.9 10*3/uL (ref 4.0–10.5)

## 2016-01-14 DIAGNOSIS — F422 Mixed obsessional thoughts and acts: Secondary | ICD-10-CM | POA: Diagnosis not present

## 2016-02-01 ENCOUNTER — Other Ambulatory Visit: Payer: Self-pay | Admitting: Psychiatry

## 2016-02-01 ENCOUNTER — Other Ambulatory Visit (HOSPITAL_COMMUNITY): Payer: Self-pay | Admitting: Lab

## 2016-02-01 LAB — CBC WITH DIFFERENTIAL/PLATELET
BAND NEUTROPHILS: 0 %
BASOS ABS: 0 10*3/uL (ref 0.0–0.1)
BLASTS: 0 %
Basophils Relative: 0 %
EOS ABS: 0 10*3/uL (ref 0.0–0.7)
Eosinophils Relative: 0 %
HEMATOCRIT: 37 % (ref 36.0–46.0)
HEMOGLOBIN: 12.6 g/dL (ref 12.0–15.0)
Lymphocytes Relative: 41 %
Lymphs Abs: 3.4 10*3/uL (ref 0.7–4.0)
MCH: 31.5 pg (ref 26.0–34.0)
MCHC: 34.1 g/dL (ref 30.0–36.0)
MCV: 92.5 fL (ref 78.0–100.0)
MONO ABS: 0.5 10*3/uL (ref 0.1–1.0)
MYELOCYTES: 0 %
Metamyelocytes Relative: 0 %
Monocytes Relative: 6 %
NEUTROS PCT: 53 %
Neutro Abs: 4.5 10*3/uL (ref 1.7–7.7)
OTHER: 0 %
PROMYELOCYTES ABS: 0 %
Platelets: 250 10*3/uL (ref 150–400)
RBC: 4 MIL/uL (ref 3.87–5.11)
RDW: 14.9 % (ref 11.5–15.5)
WBC: 8.4 10*3/uL (ref 4.0–10.5)
nRBC: 0 /100 WBC

## 2016-02-29 ENCOUNTER — Other Ambulatory Visit: Payer: Self-pay | Admitting: Psychiatry

## 2016-02-29 LAB — CBC WITH DIFFERENTIAL/PLATELET
BASOS ABS: 0 10*3/uL (ref 0.0–0.1)
BASOS PCT: 0 %
Eosinophils Absolute: 0 10*3/uL (ref 0.0–0.7)
Eosinophils Relative: 0 %
HEMATOCRIT: 37.7 % (ref 36.0–46.0)
Hemoglobin: 13.3 g/dL (ref 12.0–15.0)
LYMPHS PCT: 44 %
Lymphs Abs: 3.7 10*3/uL (ref 0.7–4.0)
MCH: 32.2 pg (ref 26.0–34.0)
MCHC: 35.3 g/dL (ref 30.0–36.0)
MCV: 91.3 fL (ref 78.0–100.0)
Monocytes Absolute: 0.6 10*3/uL (ref 0.1–1.0)
Monocytes Relative: 7 %
NEUTROS ABS: 4.1 10*3/uL (ref 1.7–7.7)
Neutrophils Relative %: 49 %
Platelets: 264 10*3/uL (ref 150–400)
RBC: 4.13 MIL/uL (ref 3.87–5.11)
RDW: 14.3 % (ref 11.5–15.5)
WBC: 8.3 10*3/uL (ref 4.0–10.5)

## 2016-03-24 DIAGNOSIS — F422 Mixed obsessional thoughts and acts: Secondary | ICD-10-CM | POA: Diagnosis not present

## 2016-04-04 ENCOUNTER — Other Ambulatory Visit: Payer: Self-pay | Admitting: Psychiatry

## 2016-04-04 LAB — CBC WITH DIFFERENTIAL/PLATELET
BASOS ABS: 0 10*3/uL (ref 0.0–0.1)
BASOS PCT: 0 %
Eosinophils Absolute: 0 10*3/uL (ref 0.0–0.7)
Eosinophils Relative: 0 %
HCT: 39 % (ref 36.0–46.0)
HEMOGLOBIN: 13.2 g/dL (ref 12.0–15.0)
Lymphocytes Relative: 36 %
Lymphs Abs: 3.3 10*3/uL (ref 0.7–4.0)
MCH: 31.4 pg (ref 26.0–34.0)
MCHC: 33.8 g/dL (ref 30.0–36.0)
MCV: 92.6 fL (ref 78.0–100.0)
Monocytes Absolute: 0.5 10*3/uL (ref 0.1–1.0)
Monocytes Relative: 5 %
NEUTROS PCT: 59 %
Neutro Abs: 5.5 10*3/uL (ref 1.7–7.7)
Platelets: 259 10*3/uL (ref 150–400)
RBC: 4.21 MIL/uL (ref 3.87–5.11)
RDW: 14.9 % (ref 11.5–15.5)
WBC: 9.3 10*3/uL (ref 4.0–10.5)

## 2016-05-02 ENCOUNTER — Other Ambulatory Visit: Payer: Self-pay | Admitting: Psychiatry

## 2016-05-02 LAB — COMPREHENSIVE METABOLIC PANEL
ALK PHOS: 45 U/L (ref 38–126)
ALT: 16 U/L (ref 14–54)
AST: 28 U/L (ref 15–41)
Albumin: 3.5 g/dL (ref 3.5–5.0)
Anion gap: 11 (ref 5–15)
BUN: 9 mg/dL (ref 6–20)
CALCIUM: 9.1 mg/dL (ref 8.9–10.3)
CO2: 22 mmol/L (ref 22–32)
CREATININE: 0.92 mg/dL (ref 0.44–1.00)
Chloride: 105 mmol/L (ref 101–111)
GFR calc non Af Amer: >60 mL/min (ref >60)
GLUCOSE: 83 mg/dL (ref 65–99)
Potassium: 4.4 mmol/L (ref 3.5–5.1)
SODIUM: 138 mmol/L (ref 135–145)
Total Bilirubin: 0.4 mg/dL (ref 0.3–1.2)
Total Protein: 7.4 g/dL (ref 6.5–8.1)

## 2016-05-02 LAB — CBC WITH DIFFERENTIAL/PLATELET
Basophils Absolute: 0 10*3/uL (ref 0.0–0.1)
Basophils Relative: 0 %
Eosinophils Absolute: 0 10*3/uL (ref 0.0–0.7)
Eosinophils Relative: 0 %
HEMATOCRIT: 38.1 % (ref 36.0–46.0)
HEMOGLOBIN: 13 g/dL (ref 12.0–15.0)
LYMPHS PCT: 40 %
Lymphs Abs: 4.2 10*3/uL — ABNORMAL HIGH (ref 0.7–4.0)
MCH: 31.8 pg (ref 26.0–34.0)
MCHC: 34.1 g/dL (ref 30.0–36.0)
MCV: 93.2 fL (ref 78.0–100.0)
Monocytes Absolute: 0.5 10*3/uL (ref 0.1–1.0)
Monocytes Relative: 5 %
NEUTROS ABS: 5.7 10*3/uL (ref 1.7–7.7)
NEUTROS PCT: 55 %
Platelets: 271 10*3/uL (ref 150–400)
RBC: 4.09 MIL/uL (ref 3.87–5.11)
RDW: 14.7 % (ref 11.5–15.5)
WBC: 10.5 10*3/uL (ref 4.0–10.5)

## 2016-05-02 LAB — LIPID PANEL
Cholesterol: 170 mg/dL (ref 0–200)
HDL: 69 mg/dL (ref >40)
LDL CALC: 80 mg/dL (ref 0–99)
Total CHOL/HDL Ratio: 2.5 RATIO
Triglycerides: 103 mg/dL (ref <150)
VLDL: 21 mg/dL (ref 0–40)

## 2016-05-03 LAB — HEMOGLOBIN A1C
Hgb A1c MFr Bld: 5.3 % (ref 4.8–5.6)
MEAN PLASMA GLUCOSE: 105 mg/dL

## 2016-05-30 ENCOUNTER — Other Ambulatory Visit: Payer: Self-pay | Admitting: Psychiatry

## 2016-05-30 LAB — CBC WITH DIFFERENTIAL/PLATELET
Basophils Absolute: 0 10*3/uL (ref 0.0–0.1)
Basophils Relative: 0 %
EOS ABS: 0 10*3/uL (ref 0.0–0.7)
Eosinophils Relative: 0 %
HCT: 40.5 % (ref 36.0–46.0)
Hemoglobin: 13.8 g/dL (ref 12.0–15.0)
LYMPHS ABS: 4.3 10*3/uL — AB (ref 0.7–4.0)
Lymphocytes Relative: 46 %
MCH: 32.2 pg (ref 26.0–34.0)
MCHC: 34.1 g/dL (ref 30.0–36.0)
MCV: 94.4 fL (ref 78.0–100.0)
MONOS PCT: 8 %
Monocytes Absolute: 0.7 10*3/uL (ref 0.1–1.0)
Neutro Abs: 4.3 10*3/uL (ref 1.7–7.7)
Neutrophils Relative %: 46 %
Platelets: 252 10*3/uL (ref 150–400)
RBC: 4.29 MIL/uL (ref 3.87–5.11)
RDW: 15.5 % (ref 11.5–15.5)
WBC: 9.2 10*3/uL (ref 4.0–10.5)

## 2016-07-04 ENCOUNTER — Other Ambulatory Visit (HOSPITAL_COMMUNITY)
Admit: 2016-07-04 | Discharge: 2016-07-04 | Disposition: A | Discharge status: Home / Self Care | Payer: Commercial Managed Care - HMO | Attending: Psychiatry | Admitting: Psychiatry

## 2016-07-04 DIAGNOSIS — Z79899 Other long term (current) drug therapy: Secondary | ICD-10-CM | POA: Diagnosis not present

## 2016-07-04 LAB — CBC WITH DIFFERENTIAL/PLATELET
Basophils Absolute: 0 10*3/uL (ref 0.0–0.1)
Basophils Relative: 0 %
EOS ABS: 0 10*3/uL (ref 0.0–0.7)
Eosinophils Relative: 0 %
HCT: 39.7 % (ref 36.0–46.0)
HEMOGLOBIN: 14 g/dL (ref 12.0–15.0)
LYMPHS ABS: 3.5 10*3/uL (ref 0.7–4.0)
Lymphocytes Relative: 41 %
MCH: 32.7 pg (ref 26.0–34.0)
MCHC: 35.3 g/dL (ref 30.0–36.0)
MCV: 92.8 fL (ref 78.0–100.0)
Monocytes Absolute: 0.5 10*3/uL (ref 0.1–1.0)
Monocytes Relative: 6 %
NEUTROS ABS: 4.5 10*3/uL (ref 1.7–7.7)
NEUTROS PCT: 53 %
Platelets: 230 10*3/uL (ref 150–400)
RBC: 4.28 MIL/uL (ref 3.87–5.11)
RDW: 14.8 % (ref 11.5–15.5)
WBC: 8.5 10*3/uL (ref 4.0–10.5)

## 2016-08-01 ENCOUNTER — Other Ambulatory Visit (HOSPITAL_COMMUNITY)
Admit: 2016-08-01 | Discharge: 2016-08-01 | Disposition: A | Discharge status: Home / Self Care | Payer: Commercial Managed Care - HMO | Attending: Psychiatry | Admitting: Psychiatry

## 2016-08-01 DIAGNOSIS — F259 Schizoaffective disorder, unspecified: Secondary | ICD-10-CM | POA: Diagnosis not present

## 2016-08-01 LAB — CBC WITH DIFFERENTIAL/PLATELET
Basophils Absolute: 0 10*3/uL (ref 0.0–0.1)
Basophils Relative: 0 %
EOS PCT: 0 %
Eosinophils Absolute: 0 10*3/uL (ref 0.0–0.7)
HEMATOCRIT: 38.6 % (ref 36.0–46.0)
Hemoglobin: 13 g/dL (ref 12.0–15.0)
LYMPHS ABS: 3.8 10*3/uL (ref 0.7–4.0)
LYMPHS PCT: 46 %
MCH: 31.9 pg (ref 26.0–34.0)
MCHC: 33.7 g/dL (ref 30.0–36.0)
MCV: 94.6 fL (ref 78.0–100.0)
MONO ABS: 0.5 10*3/uL (ref 0.1–1.0)
MONOS PCT: 6 %
Neutro Abs: 4 10*3/uL (ref 1.7–7.7)
Neutrophils Relative %: 48 %
Platelets: 240 10*3/uL (ref 150–400)
RBC: 4.08 MIL/uL (ref 3.87–5.11)
RDW: 14.5 % (ref 11.5–15.5)
WBC: 8.2 10*3/uL (ref 4.0–10.5)

## 2016-08-13 DIAGNOSIS — Z79899 Other long term (current) drug therapy: Secondary | ICD-10-CM | POA: Diagnosis not present

## 2016-08-13 DIAGNOSIS — H6123 Impacted cerumen, bilateral: Secondary | ICD-10-CM | POA: Diagnosis not present

## 2016-08-13 DIAGNOSIS — R7303 Prediabetes: Secondary | ICD-10-CM | POA: Diagnosis not present

## 2016-08-13 DIAGNOSIS — Z0000 Encounter for general adult medical examination without abnormal findings: Secondary | ICD-10-CM | POA: Diagnosis not present

## 2016-08-13 DIAGNOSIS — E669 Obesity, unspecified: Secondary | ICD-10-CM | POA: Diagnosis not present

## 2016-08-13 DIAGNOSIS — F250 Schizoaffective disorder, bipolar type: Secondary | ICD-10-CM | POA: Diagnosis not present

## 2016-08-29 ENCOUNTER — Other Ambulatory Visit (HOSPITAL_COMMUNITY)
Admit: 2016-08-29 | Discharge: 2016-08-29 | Disposition: A | Discharge status: Home / Self Care | Payer: Medicare HMO | Attending: Psychiatry | Admitting: Psychiatry

## 2016-08-29 ENCOUNTER — Other Ambulatory Visit (HOSPITAL_COMMUNITY): Admission: RE | Admit: 2016-08-29 | Payer: Commercial Managed Care - HMO | Source: Ambulatory Visit

## 2016-08-29 DIAGNOSIS — F259 Schizoaffective disorder, unspecified: Secondary | ICD-10-CM | POA: Insufficient documentation

## 2016-08-29 LAB — CBC WITH DIFFERENTIAL/PLATELET
BASOS ABS: 0.1 10*3/uL (ref 0.0–0.1)
Basophils Relative: 1 %
EOS PCT: 0 %
Eosinophils Absolute: 0 10*3/uL (ref 0.0–0.7)
HEMATOCRIT: 38.8 % (ref 36.0–46.0)
HEMOGLOBIN: 13 g/dL (ref 12.0–15.0)
LYMPHS ABS: 3.2 10*3/uL (ref 0.7–4.0)
Lymphocytes Relative: 38 %
MCH: 31.9 pg (ref 26.0–34.0)
MCHC: 33.5 g/dL (ref 30.0–36.0)
MCV: 95.1 fL (ref 78.0–100.0)
MONOS PCT: 6 %
Monocytes Absolute: 0.5 10*3/uL (ref 0.1–1.0)
NEUTROS ABS: 4.6 10*3/uL (ref 1.7–7.7)
Neutrophils Relative %: 55 %
Platelets: 267 10*3/uL (ref 150–400)
RBC: 4.08 MIL/uL (ref 3.87–5.11)
RDW: 14.3 % (ref 11.5–15.5)
WBC: 8.4 10*3/uL (ref 4.0–10.5)

## 2016-10-03 LAB — CBC WITH DIFFERENTIAL/PLATELET
Basophils Absolute: 0 K/uL (ref 0.0–0.1)
Basophils Relative: 0 %
Eosinophils Absolute: 0 K/uL (ref 0.0–0.7)
Eosinophils Relative: 0 %
HCT: 39.8 % (ref 36.0–46.0)
Hemoglobin: 13.6 g/dL (ref 12.0–15.0)
Lymphocytes Relative: 39 %
Lymphs Abs: 3.6 K/uL (ref 0.7–4.0)
MCH: 31.9 pg (ref 26.0–34.0)
MCHC: 34.2 g/dL (ref 30.0–36.0)
MCV: 93.4 fL (ref 78.0–100.0)
Monocytes Absolute: 0.6 K/uL (ref 0.1–1.0)
Monocytes Relative: 7 %
Neutro Abs: 5.1 K/uL (ref 1.7–7.7)
Neutrophils Relative %: 54 %
Platelets: 248 K/uL (ref 150–400)
RBC: 4.26 MIL/uL (ref 3.87–5.11)
RDW: 14.3 % (ref 11.5–15.5)
WBC: 9.3 K/uL (ref 4.0–10.5)

## 2016-10-31 LAB — CBC WITH DIFFERENTIAL/PLATELET
BASOS PCT: 0 %
Basophils Absolute: 0 10*3/uL (ref 0.0–0.1)
EOS ABS: 0 10*3/uL (ref 0.0–0.7)
Eosinophils Relative: 0 %
HEMATOCRIT: 37.4 % (ref 36.0–46.0)
HEMOGLOBIN: 12.6 g/dL (ref 12.0–15.0)
LYMPHS ABS: 3.7 10*3/uL (ref 0.7–4.0)
Lymphocytes Relative: 48 %
MCH: 31.2 pg (ref 26.0–34.0)
MCHC: 33.7 g/dL (ref 30.0–36.0)
MCV: 92.6 fL (ref 78.0–100.0)
MONO ABS: 0.4 10*3/uL (ref 0.1–1.0)
MONOS PCT: 6 %
Neutro Abs: 3.4 10*3/uL (ref 1.7–7.7)
Neutrophils Relative %: 46 %
Platelets: 261 10*3/uL (ref 150–400)
RBC: 4.04 MIL/uL (ref 3.87–5.11)
RDW: 14.5 % (ref 11.5–15.5)
WBC: 7.5 10*3/uL (ref 4.0–10.5)

## 2016-11-18 DIAGNOSIS — N939 Abnormal uterine and vaginal bleeding, unspecified: Secondary | ICD-10-CM | POA: Diagnosis not present

## 2016-11-18 DIAGNOSIS — Z6841 Body Mass Index (BMI) 40.0 and over, adult: Secondary | ICD-10-CM | POA: Diagnosis not present

## 2016-11-18 DIAGNOSIS — Z01419 Encounter for gynecological examination (general) (routine) without abnormal findings: Secondary | ICD-10-CM | POA: Diagnosis not present

## 2016-11-18 DIAGNOSIS — Z3041 Encounter for surveillance of contraceptive pills: Secondary | ICD-10-CM | POA: Diagnosis not present

## 2016-11-28 LAB — CBC WITH DIFFERENTIAL/PLATELET
Basophils Absolute: 0 10*3/uL (ref 0.0–0.1)
Basophils Relative: 0 %
EOS ABS: 0 10*3/uL (ref 0.0–0.7)
EOS PCT: 0 %
HCT: 35.9 % — ABNORMAL LOW (ref 36.0–46.0)
HEMOGLOBIN: 12.1 g/dL (ref 12.0–15.0)
Lymphocytes Relative: 36 %
Lymphs Abs: 3.7 10*3/uL (ref 0.7–4.0)
MCH: 31.2 pg (ref 26.0–34.0)
MCHC: 33.7 g/dL (ref 30.0–36.0)
MCV: 92.5 fL (ref 78.0–100.0)
MONO ABS: 0.7 10*3/uL (ref 0.1–1.0)
Monocytes Relative: 7 %
NEUTROS ABS: 5.9 10*3/uL (ref 1.7–7.7)
Neutrophils Relative %: 57 %
Platelets: 261 10*3/uL (ref 150–400)
RBC: 3.88 MIL/uL (ref 3.87–5.11)
RDW: 15.1 % (ref 11.5–15.5)
WBC: 10.3 10*3/uL (ref 4.0–10.5)

## 2017-01-02 LAB — CBC WITH DIFFERENTIAL/PLATELET
Basophils Absolute: 0 10*3/uL (ref 0.0–0.1)
Basophils Relative: 0 %
Eosinophils Absolute: 0 10*3/uL (ref 0.0–0.7)
Eosinophils Relative: 0 %
HEMATOCRIT: 41.1 % (ref 36.0–46.0)
HEMOGLOBIN: 13.6 g/dL (ref 12.0–15.0)
LYMPHS ABS: 3.6 10*3/uL (ref 0.7–4.0)
Lymphocytes Relative: 45 %
MCH: 30.6 pg (ref 26.0–34.0)
MCHC: 33.1 g/dL (ref 30.0–36.0)
MCV: 92.6 fL (ref 78.0–100.0)
MONO ABS: 0.4 10*3/uL (ref 0.1–1.0)
Monocytes Relative: 5 %
NEUTROS ABS: 4 10*3/uL (ref 1.7–7.7)
NEUTROS PCT: 50 %
Platelets: 208 10*3/uL (ref 150–400)
RBC: 4.44 MIL/uL (ref 3.87–5.11)
RDW: 15 % (ref 11.5–15.5)
WBC: 7.9 10*3/uL (ref 4.0–10.5)

## 2017-01-30 LAB — CBC WITH DIFFERENTIAL/PLATELET
BASOS ABS: 0 10*3/uL (ref 0.0–0.1)
Basophils Relative: 0 %
EOS PCT: 0 %
Eosinophils Absolute: 0 10*3/uL (ref 0.0–0.7)
HEMATOCRIT: 38.3 % (ref 36.0–46.0)
Hemoglobin: 13.3 g/dL (ref 12.0–15.0)
LYMPHS ABS: 3.2 10*3/uL (ref 0.7–4.0)
LYMPHS PCT: 43 %
MCH: 31.8 pg (ref 26.0–34.0)
MCHC: 34.7 g/dL (ref 30.0–36.0)
MCV: 91.6 fL (ref 78.0–100.0)
MONO ABS: 0.4 10*3/uL (ref 0.1–1.0)
Monocytes Relative: 6 %
NEUTROS ABS: 3.8 10*3/uL (ref 1.7–7.7)
Neutrophils Relative %: 51 %
PLATELETS: 233 10*3/uL (ref 150–400)
RBC: 4.18 MIL/uL (ref 3.87–5.11)
RDW: 14.6 % (ref 11.5–15.5)
WBC: 7.5 10*3/uL (ref 4.0–10.5)

## 2017-02-17 DIAGNOSIS — F429 Obsessive-compulsive disorder, unspecified: Secondary | ICD-10-CM | POA: Diagnosis not present

## 2017-02-17 DIAGNOSIS — R7303 Prediabetes: Secondary | ICD-10-CM | POA: Diagnosis not present

## 2017-02-17 DIAGNOSIS — F250 Schizoaffective disorder, bipolar type: Secondary | ICD-10-CM | POA: Diagnosis not present

## 2017-02-17 DIAGNOSIS — E669 Obesity, unspecified: Secondary | ICD-10-CM | POA: Diagnosis not present

## 2017-02-27 ENCOUNTER — Other Ambulatory Visit (HOSPITAL_COMMUNITY)
Admission: RE | Admit: 2017-02-27 | Discharge: 2017-02-27 | Disposition: A | Discharge status: Home / Self Care | Payer: Medicare HMO | Source: Other Acute Inpatient Hospital | Attending: Psychiatry | Admitting: Psychiatry

## 2017-02-27 DIAGNOSIS — Z029 Encounter for administrative examinations, unspecified: Secondary | ICD-10-CM | POA: Diagnosis present

## 2017-02-27 LAB — CBC WITH DIFFERENTIAL/PLATELET
BASOS ABS: 0 10*3/uL (ref 0.0–0.1)
BASOS PCT: 0 %
Eosinophils Absolute: 0 10*3/uL (ref 0.0–0.7)
Eosinophils Relative: 0 %
HEMATOCRIT: 40.2 % (ref 36.0–46.0)
HEMOGLOBIN: 13.8 g/dL (ref 12.0–15.0)
LYMPHS PCT: 42 %
Lymphs Abs: 3.5 10*3/uL (ref 0.7–4.0)
MCH: 32.1 pg (ref 26.0–34.0)
MCHC: 34.3 g/dL (ref 30.0–36.0)
MCV: 93.5 fL (ref 78.0–100.0)
MONOS PCT: 5 %
Monocytes Absolute: 0.5 10*3/uL (ref 0.1–1.0)
NEUTROS ABS: 4.4 10*3/uL (ref 1.7–7.7)
NEUTROS PCT: 53 %
Platelets: 185 10*3/uL (ref 150–400)
RBC: 4.3 MIL/uL (ref 3.87–5.11)
RDW: 15.1 % (ref 11.5–15.5)
WBC: 8.4 10*3/uL (ref 4.0–10.5)

## 2017-04-03 ENCOUNTER — Other Ambulatory Visit (HOSPITAL_COMMUNITY)
Admit: 2017-04-03 | Discharge: 2017-04-03 | Disposition: A | Discharge status: Home / Self Care | Payer: Medicare HMO | Attending: Psychiatry | Admitting: Psychiatry

## 2017-04-03 DIAGNOSIS — F259 Schizoaffective disorder, unspecified: Secondary | ICD-10-CM | POA: Diagnosis not present

## 2017-04-03 LAB — CBC WITH DIFFERENTIAL/PLATELET
BASOS ABS: 0 10*3/uL (ref 0.0–0.1)
Basophils Relative: 0 %
Eosinophils Absolute: 0 10*3/uL (ref 0.0–0.7)
Eosinophils Relative: 0 %
HEMATOCRIT: 41.9 % (ref 36.0–46.0)
HEMOGLOBIN: 14.4 g/dL (ref 12.0–15.0)
LYMPHS PCT: 42 %
Lymphs Abs: 3.1 10*3/uL (ref 0.7–4.0)
MCH: 32.3 pg (ref 26.0–34.0)
MCHC: 34.4 g/dL (ref 30.0–36.0)
MCV: 93.9 fL (ref 78.0–100.0)
MONO ABS: 0.4 10*3/uL (ref 0.1–1.0)
Monocytes Relative: 6 %
NEUTROS PCT: 52 %
Neutro Abs: 3.8 10*3/uL (ref 1.7–7.7)
Platelets: 230 10*3/uL (ref 150–400)
RBC: 4.46 MIL/uL (ref 3.87–5.11)
RDW: 15.4 % (ref 11.5–15.5)
WBC: 7.3 10*3/uL (ref 4.0–10.5)

## 2017-04-23 ENCOUNTER — Encounter: Payer: Medicare HMO | Admitting: Registered"

## 2017-05-01 ENCOUNTER — Other Ambulatory Visit (HOSPITAL_COMMUNITY)
Admit: 2017-05-01 | Discharge: 2017-05-01 | Disposition: A | Discharge status: Home / Self Care | Payer: Medicare HMO | Attending: Psychiatry | Admitting: Psychiatry

## 2017-05-01 DIAGNOSIS — F259 Schizoaffective disorder, unspecified: Secondary | ICD-10-CM | POA: Insufficient documentation

## 2017-05-01 LAB — CBC WITH DIFFERENTIAL/PLATELET
BASOS ABS: 0 10*3/uL (ref 0.0–0.1)
Basophils Relative: 0 %
EOS ABS: 0 10*3/uL (ref 0.0–0.7)
EOS PCT: 0 %
HCT: 41.2 % (ref 36.0–46.0)
Hemoglobin: 14 g/dL (ref 12.0–15.0)
Lymphocytes Relative: 40 %
Lymphs Abs: 3.6 10*3/uL (ref 0.7–4.0)
MCH: 31.8 pg (ref 26.0–34.0)
MCHC: 34 g/dL (ref 30.0–36.0)
MCV: 93.6 fL (ref 78.0–100.0)
MONO ABS: 0.6 10*3/uL (ref 0.1–1.0)
Monocytes Relative: 6 %
Neutro Abs: 4.9 10*3/uL (ref 1.7–7.7)
Neutrophils Relative %: 54 %
PLATELETS: 217 10*3/uL (ref 150–400)
RBC: 4.4 MIL/uL (ref 3.87–5.11)
RDW: 14.7 % (ref 11.5–15.5)
WBC: 9.1 10*3/uL (ref 4.0–10.5)

## 2017-05-20 ENCOUNTER — Ambulatory Visit (INDEPENDENT_AMBULATORY_CARE_PROVIDER_SITE_OTHER): Payer: Medicare HMO

## 2017-05-20 DIAGNOSIS — Z111 Encounter for screening for respiratory tuberculosis: Secondary | ICD-10-CM | POA: Diagnosis not present

## 2017-05-20 NOTE — Progress Notes (Signed)

## 2017-05-20 NOTE — Progress Notes (Signed)
Per protocol and verbal orders of Dr. Carlota Raspberry, Tuberculin skin test applied to Left ventral forearm. Patient advised to return to clinic in 48-72 hours for reading test- between 12:15pm and 4:30pm on Saturday 05/22/2017. She verbalizes understanding.

## 2017-05-22 ENCOUNTER — Ambulatory Visit: Payer: Medicare HMO

## 2017-05-22 DIAGNOSIS — Z111 Encounter for screening for respiratory tuberculosis: Secondary | ICD-10-CM

## 2017-05-22 LAB — TB SKIN TEST
Induration: 0 mm
TB Skin Test: NEGATIVE

## 2017-05-22 NOTE — Progress Notes (Signed)
PPD Reading Note PPD read and results entered in [facility]. Result: 0 mm induration. Interpretation: negative If test not read within 48-72 hours of initial placement, patient advised to repeat in other arm 1-3 weeks after this test.

## 2017-05-29 ENCOUNTER — Other Ambulatory Visit (HOSPITAL_COMMUNITY)
Admit: 2017-05-29 | Discharge: 2017-05-29 | Disposition: A | Discharge status: Home / Self Care | Payer: Medicare HMO | Attending: Psychiatry | Admitting: Psychiatry

## 2017-05-29 DIAGNOSIS — F259 Schizoaffective disorder, unspecified: Secondary | ICD-10-CM | POA: Diagnosis not present

## 2017-05-29 LAB — VITAMIN B12: Vitamin B-12: 305 pg/mL (ref 180–914)

## 2017-05-29 LAB — CBC WITH DIFFERENTIAL/PLATELET
BASOS ABS: 0 10*3/uL (ref 0.0–0.1)
BASOS PCT: 0 %
Eosinophils Absolute: 0 10*3/uL (ref 0.0–0.7)
Eosinophils Relative: 0 %
HEMATOCRIT: 37.5 % (ref 36.0–46.0)
HEMOGLOBIN: 12.8 g/dL (ref 12.0–15.0)
LYMPHS PCT: 41 %
Lymphs Abs: 3.5 10*3/uL (ref 0.7–4.0)
MCH: 31.5 pg (ref 26.0–34.0)
MCHC: 34.1 g/dL (ref 30.0–36.0)
MCV: 92.4 fL (ref 78.0–100.0)
MONO ABS: 0.5 10*3/uL (ref 0.1–1.0)
MONOS PCT: 6 %
NEUTROS ABS: 4.6 10*3/uL (ref 1.7–7.7)
NEUTROS PCT: 53 %
Platelets: 223 10*3/uL (ref 150–400)
RBC: 4.06 MIL/uL (ref 3.87–5.11)
RDW: 14.9 % (ref 11.5–15.5)
WBC: 8.7 10*3/uL (ref 4.0–10.5)

## 2017-05-29 LAB — COMPREHENSIVE METABOLIC PANEL
ALBUMIN: 2.9 g/dL — AB (ref 3.5–5.0)
ALK PHOS: 49 U/L (ref 38–126)
ALT: 13 U/L — ABNORMAL LOW (ref 14–54)
AST: 20 U/L (ref 15–41)
Anion gap: 11 (ref 5–15)
BILIRUBIN TOTAL: 0.6 mg/dL (ref 0.3–1.2)
BUN: 6 mg/dL (ref 6–20)
CALCIUM: 8.4 mg/dL — AB (ref 8.9–10.3)
CO2: 22 mmol/L (ref 22–32)
Chloride: 104 mmol/L (ref 101–111)
Creatinine, Ser: 0.89 mg/dL (ref 0.44–1.00)
GFR calc Af Amer: >60 mL/min (ref >60)
GLUCOSE: 90 mg/dL (ref 65–99)
Potassium: 3.7 mmol/L (ref 3.5–5.1)
Sodium: 137 mmol/L (ref 135–145)
TOTAL PROTEIN: 6.9 g/dL (ref 6.5–8.1)

## 2017-05-29 LAB — LIPID PANEL
CHOL/HDL RATIO: 2.6 ratio
Cholesterol: 155 mg/dL (ref 0–200)
HDL: 60 mg/dL (ref >40)
LDL Cholesterol: 74 mg/dL (ref 0–99)
Triglycerides: 104 mg/dL (ref <150)
VLDL: 21 mg/dL (ref 0–40)

## 2017-05-29 LAB — VALPROIC ACID LEVEL: VALPROIC ACID LVL: 27 ug/mL — AB (ref 50.0–100.0)

## 2017-05-29 LAB — HEMOGLOBIN A1C
Hgb A1c MFr Bld: 5.3 % (ref 4.8–5.6)
Mean Plasma Glucose: 105.41 mg/dL

## 2017-05-29 LAB — FOLATE: FOLATE: 51.3 ng/mL (ref >5.9)

## 2017-05-29 LAB — TSH: TSH: 0.977 u[IU]/mL (ref 0.350–4.500)

## 2017-05-31 LAB — VITAMIN D 25 HYDROXY (VIT D DEFICIENCY, FRACTURES): VIT D 25 HYDROXY: 38.8 ng/mL (ref 30.0–100.0)

## 2017-07-03 ENCOUNTER — Other Ambulatory Visit (HOSPITAL_COMMUNITY)
Admit: 2017-07-03 | Discharge: 2017-07-03 | Disposition: A | Discharge status: Home / Self Care | Payer: Medicare HMO | Attending: Psychiatry | Admitting: Psychiatry

## 2017-07-03 DIAGNOSIS — F259 Schizoaffective disorder, unspecified: Secondary | ICD-10-CM | POA: Insufficient documentation

## 2017-07-03 LAB — CBC WITH DIFFERENTIAL/PLATELET
BASOS ABS: 0 10*3/uL (ref 0.0–0.1)
Basophils Relative: 0 %
EOS ABS: 0 10*3/uL (ref 0.0–0.7)
Eosinophils Relative: 0 %
HCT: 37.8 % (ref 36.0–46.0)
HEMOGLOBIN: 12.8 g/dL (ref 12.0–15.0)
LYMPHS PCT: 45 %
Lymphs Abs: 2.9 10*3/uL (ref 0.7–4.0)
MCH: 31.1 pg (ref 26.0–34.0)
MCHC: 33.9 g/dL (ref 30.0–36.0)
MCV: 92 fL (ref 78.0–100.0)
MONO ABS: 0.5 10*3/uL (ref 0.1–1.0)
Monocytes Relative: 7 %
NEUTROS ABS: 3.1 10*3/uL (ref 1.7–7.7)
Neutrophils Relative %: 48 %
PLATELETS: 302 10*3/uL (ref 150–400)
RBC: 4.11 MIL/uL (ref 3.87–5.11)
RDW: 14.6 % (ref 11.5–15.5)
WBC: 6.5 10*3/uL (ref 4.0–10.5)

## 2017-07-31 ENCOUNTER — Other Ambulatory Visit (HOSPITAL_COMMUNITY)
Admit: 2017-07-31 | Discharge: 2017-07-31 | Disposition: A | Discharge status: Home / Self Care | Payer: Medicare HMO | Attending: Psychiatry | Admitting: Psychiatry

## 2017-07-31 DIAGNOSIS — F259 Schizoaffective disorder, unspecified: Secondary | ICD-10-CM | POA: Diagnosis not present

## 2017-07-31 LAB — CBC WITH DIFFERENTIAL/PLATELET
Abs Immature Granulocytes: 0 10*3/uL (ref 0.0–0.1)
Basophils Absolute: 0 10*3/uL (ref 0.0–0.1)
Basophils Relative: 0 %
EOS PCT: 0 %
Eosinophils Absolute: 0 10*3/uL (ref 0.0–0.7)
HEMATOCRIT: 38.4 % (ref 36.0–46.0)
HEMOGLOBIN: 12.7 g/dL (ref 12.0–15.0)
IMMATURE GRANULOCYTES: 0 %
LYMPHS ABS: 4.2 10*3/uL — AB (ref 0.7–4.0)
LYMPHS PCT: 48 %
MCH: 30.5 pg (ref 26.0–34.0)
MCHC: 33.1 g/dL (ref 30.0–36.0)
MCV: 92.3 fL (ref 78.0–100.0)
Monocytes Absolute: 0.6 10*3/uL (ref 0.1–1.0)
Monocytes Relative: 7 %
Neutro Abs: 3.8 10*3/uL (ref 1.7–7.7)
Neutrophils Relative %: 45 %
Platelets: 279 10*3/uL (ref 150–400)
RBC: 4.16 MIL/uL (ref 3.87–5.11)
RDW: 15.4 % (ref 11.5–15.5)
WBC: 8.6 10*3/uL (ref 4.0–10.5)

## 2017-08-28 ENCOUNTER — Other Ambulatory Visit (HOSPITAL_COMMUNITY)
Admit: 2017-08-28 | Discharge: 2017-08-28 | Disposition: A | Discharge status: Home / Self Care | Payer: Medicare HMO | Attending: Psychiatry | Admitting: Psychiatry

## 2017-08-28 DIAGNOSIS — F259 Schizoaffective disorder, unspecified: Secondary | ICD-10-CM | POA: Diagnosis not present

## 2017-08-28 LAB — CBC WITH DIFFERENTIAL/PLATELET
Abs Immature Granulocytes: 0 10*3/uL (ref 0.0–0.1)
Basophils Absolute: 0 10*3/uL (ref 0.0–0.1)
Basophils Relative: 0 %
EOS ABS: 0 10*3/uL (ref 0.0–0.7)
EOS PCT: 0 %
HEMATOCRIT: 40.6 % (ref 36.0–46.0)
HEMOGLOBIN: 13.5 g/dL (ref 12.0–15.0)
Immature Granulocytes: 1 %
LYMPHS ABS: 3.3 10*3/uL (ref 0.7–4.0)
LYMPHS PCT: 43 %
MCH: 30.5 pg (ref 26.0–34.0)
MCHC: 33.3 g/dL (ref 30.0–36.0)
MCV: 91.6 fL (ref 78.0–100.0)
MONO ABS: 0.4 10*3/uL (ref 0.1–1.0)
MONOS PCT: 6 %
Neutro Abs: 3.9 10*3/uL (ref 1.7–7.7)
Neutrophils Relative %: 50 %
Platelets: 267 10*3/uL (ref 150–400)
RBC: 4.43 MIL/uL (ref 3.87–5.11)
RDW: 15.7 % — AB (ref 11.5–15.5)
WBC: 7.6 10*3/uL (ref 4.0–10.5)

## 2017-09-29 DIAGNOSIS — Z0000 Encounter for general adult medical examination without abnormal findings: Secondary | ICD-10-CM | POA: Diagnosis not present

## 2017-09-29 DIAGNOSIS — F429 Obsessive-compulsive disorder, unspecified: Secondary | ICD-10-CM | POA: Diagnosis not present

## 2017-09-29 DIAGNOSIS — E669 Obesity, unspecified: Secondary | ICD-10-CM | POA: Diagnosis not present

## 2017-09-29 DIAGNOSIS — R7303 Prediabetes: Secondary | ICD-10-CM | POA: Diagnosis not present

## 2017-09-29 DIAGNOSIS — F250 Schizoaffective disorder, bipolar type: Secondary | ICD-10-CM | POA: Diagnosis not present

## 2017-10-02 ENCOUNTER — Other Ambulatory Visit (HOSPITAL_COMMUNITY)
Admit: 2017-10-02 | Discharge: 2017-10-02 | Disposition: A | Discharge status: Home / Self Care | Payer: Medicare HMO | Attending: Psychiatry | Admitting: Psychiatry

## 2017-10-02 DIAGNOSIS — F259 Schizoaffective disorder, unspecified: Secondary | ICD-10-CM | POA: Diagnosis not present

## 2017-10-02 LAB — CBC WITH DIFFERENTIAL/PLATELET
ABS IMMATURE GRANULOCYTES: 0 10*3/uL (ref 0.0–0.1)
BASOS ABS: 0 10*3/uL (ref 0.0–0.1)
Basophils Relative: 0 %
EOS PCT: 0 %
Eosinophils Absolute: 0 10*3/uL (ref 0.0–0.7)
HEMATOCRIT: 39.2 % (ref 36.0–46.0)
Hemoglobin: 13 g/dL (ref 12.0–15.0)
Immature Granulocytes: 1 %
LYMPHS ABS: 3 10*3/uL (ref 0.7–4.0)
Lymphocytes Relative: 39 %
MCH: 30.4 pg (ref 26.0–34.0)
MCHC: 33.2 g/dL (ref 30.0–36.0)
MCV: 91.8 fL (ref 78.0–100.0)
Monocytes Absolute: 0.4 10*3/uL (ref 0.1–1.0)
Monocytes Relative: 6 %
NEUTROS ABS: 4.1 10*3/uL (ref 1.7–7.7)
Neutrophils Relative %: 54 %
Platelets: 254 10*3/uL (ref 150–400)
RBC: 4.27 MIL/uL (ref 3.87–5.11)
RDW: 15.4 % (ref 11.5–15.5)
WBC: 7.6 10*3/uL (ref 4.0–10.5)

## 2017-10-30 ENCOUNTER — Other Ambulatory Visit (HOSPITAL_COMMUNITY)
Admit: 2017-10-30 | Discharge: 2017-10-30 | Disposition: A | Discharge status: Home / Self Care | Payer: Medicare HMO | Attending: Psychiatry | Admitting: Psychiatry

## 2017-10-30 DIAGNOSIS — F259 Schizoaffective disorder, unspecified: Secondary | ICD-10-CM | POA: Insufficient documentation

## 2017-10-30 LAB — CBC WITH DIFFERENTIAL/PLATELET
ABS IMMATURE GRANULOCYTES: 0 10*3/uL (ref 0.0–0.1)
BASOS PCT: 0 %
Basophils Absolute: 0 10*3/uL (ref 0.0–0.1)
EOS ABS: 0 10*3/uL (ref 0.0–0.7)
Eosinophils Relative: 0 %
HCT: 36.7 % (ref 36.0–46.0)
Hemoglobin: 12.7 g/dL (ref 12.0–15.0)
Immature Granulocytes: 1 %
Lymphocytes Relative: 40 %
Lymphs Abs: 3.4 10*3/uL (ref 0.7–4.0)
MCH: 31.4 pg (ref 26.0–34.0)
MCHC: 34.6 g/dL (ref 30.0–36.0)
MCV: 90.8 fL (ref 78.0–100.0)
MONOS PCT: 7 %
Monocytes Absolute: 0.6 10*3/uL (ref 0.1–1.0)
Neutro Abs: 4.5 10*3/uL (ref 1.7–7.7)
Neutrophils Relative %: 52 %
PLATELETS: 238 10*3/uL (ref 150–400)
RBC: 4.04 MIL/uL (ref 3.87–5.11)
RDW: 14.6 % (ref 11.5–15.5)
WBC: 8.5 10*3/uL (ref 4.0–10.5)

## 2017-12-04 ENCOUNTER — Other Ambulatory Visit (HOSPITAL_COMMUNITY)
Admit: 2017-12-04 | Discharge: 2017-12-04 | Disposition: A | Discharge status: Home / Self Care | Payer: Medicare HMO | Attending: Psychiatry | Admitting: Psychiatry

## 2017-12-04 DIAGNOSIS — F259 Schizoaffective disorder, unspecified: Secondary | ICD-10-CM | POA: Insufficient documentation

## 2017-12-04 LAB — COMPREHENSIVE METABOLIC PANEL
ALBUMIN: 3.1 g/dL — AB (ref 3.5–5.0)
ALT: 12 U/L (ref 0–44)
AST: 15 U/L (ref 15–41)
Alkaline Phosphatase: 52 U/L (ref 38–126)
Anion gap: 10 (ref 5–15)
BUN: 9 mg/dL (ref 6–20)
CHLORIDE: 104 mmol/L (ref 98–111)
CO2: 25 mmol/L (ref 22–32)
CREATININE: 0.92 mg/dL (ref 0.44–1.00)
Calcium: 8.6 mg/dL — ABNORMAL LOW (ref 8.9–10.3)
GFR calc Af Amer: >60 mL/min (ref >60)
GFR calc non Af Amer: >60 mL/min (ref >60)
GLUCOSE: 94 mg/dL (ref 70–99)
POTASSIUM: 3.9 mmol/L (ref 3.5–5.1)
Sodium: 139 mmol/L (ref 135–145)
Total Bilirubin: 0.5 mg/dL (ref 0.3–1.2)
Total Protein: 6.7 g/dL (ref 6.5–8.1)

## 2017-12-04 LAB — HEMOGLOBIN A1C
Hgb A1c MFr Bld: 5.5 % (ref 4.8–5.6)
Mean Plasma Glucose: 111.15 mg/dL

## 2017-12-04 LAB — CBC WITH DIFFERENTIAL/PLATELET
BASOS PCT: 0 %
Basophils Absolute: 0 10*3/uL (ref 0.0–0.1)
EOS ABS: 0 10*3/uL (ref 0.0–0.7)
Eosinophils Relative: 0 %
HEMATOCRIT: 39.6 % (ref 36.0–46.0)
Hemoglobin: 13.5 g/dL (ref 12.0–15.0)
LYMPHS PCT: 42 %
Lymphs Abs: 3.2 10*3/uL (ref 0.7–4.0)
MCH: 31.1 pg (ref 26.0–34.0)
MCHC: 34.1 g/dL (ref 30.0–36.0)
MCV: 91.2 fL (ref 78.0–100.0)
MONOS PCT: 5 %
Monocytes Absolute: 0.4 10*3/uL (ref 0.1–1.0)
NEUTROS ABS: 4.1 10*3/uL (ref 1.7–7.7)
Neutrophils Relative %: 53 %
PLATELETS: DECREASED 10*3/uL (ref 150–400)
RBC: 4.34 MIL/uL (ref 3.87–5.11)
RDW: 14.7 % (ref 11.5–15.5)
Smear Review: DECREASED
WBC: 7.7 10*3/uL (ref 4.0–10.5)

## 2017-12-04 LAB — VALPROIC ACID LEVEL: Valproic Acid Lvl: 29 ug/mL — ABNORMAL LOW (ref 50.0–100.0)

## 2017-12-04 LAB — LIPID PANEL
CHOL/HDL RATIO: 3.4 ratio
CHOLESTEROL: 175 mg/dL (ref 0–200)
HDL: 52 mg/dL (ref >40)
LDL CALC: 87 mg/dL (ref 0–99)
TRIGLYCERIDES: 179 mg/dL — AB (ref <150)
VLDL: 36 mg/dL (ref 0–40)

## 2017-12-04 LAB — VITAMIN B12: Vitamin B-12: 212 pg/mL (ref 180–914)

## 2017-12-04 LAB — TSH: TSH: 1.101 u[IU]/mL (ref 0.350–4.500)

## 2017-12-04 LAB — FOLATE: Folate: 30 ng/mL (ref >5.9)

## 2017-12-06 LAB — VITAMIN D 25 HYDROXY (VIT D DEFICIENCY, FRACTURES): Vit D, 25-Hydroxy: 36.2 ng/mL (ref 30.0–100.0)

## 2017-12-08 DIAGNOSIS — R87610 Atypical squamous cells of undetermined significance on cytologic smear of cervix (ASC-US): Secondary | ICD-10-CM | POA: Diagnosis not present

## 2017-12-08 DIAGNOSIS — Z124 Encounter for screening for malignant neoplasm of cervix: Secondary | ICD-10-CM | POA: Diagnosis not present

## 2017-12-08 DIAGNOSIS — Z01419 Encounter for gynecological examination (general) (routine) without abnormal findings: Secondary | ICD-10-CM | POA: Diagnosis not present

## 2017-12-08 DIAGNOSIS — Z1231 Encounter for screening mammogram for malignant neoplasm of breast: Secondary | ICD-10-CM | POA: Diagnosis not present

## 2017-12-08 DIAGNOSIS — Z6841 Body Mass Index (BMI) 40.0 and over, adult: Secondary | ICD-10-CM | POA: Diagnosis not present

## 2017-12-08 DIAGNOSIS — Z3041 Encounter for surveillance of contraceptive pills: Secondary | ICD-10-CM | POA: Diagnosis not present

## 2017-12-08 DIAGNOSIS — N921 Excessive and frequent menstruation with irregular cycle: Secondary | ICD-10-CM | POA: Diagnosis not present

## 2017-12-08 LAB — HM PAP SMEAR: HM Pap smear: ABNORMAL

## 2017-12-08 LAB — HM MAMMOGRAPHY: HM Mammogram: ABNORMAL — AB (ref 0–4)

## 2017-12-14 DIAGNOSIS — S6392XA Sprain of unspecified part of left wrist and hand, initial encounter: Secondary | ICD-10-CM | POA: Diagnosis not present

## 2017-12-20 DIAGNOSIS — R87610 Atypical squamous cells of undetermined significance on cytologic smear of cervix (ASC-US): Secondary | ICD-10-CM | POA: Diagnosis not present

## 2017-12-20 DIAGNOSIS — N72 Inflammatory disease of cervix uteri: Secondary | ICD-10-CM | POA: Diagnosis not present

## 2017-12-20 DIAGNOSIS — B977 Papillomavirus as the cause of diseases classified elsewhere: Secondary | ICD-10-CM | POA: Diagnosis not present

## 2017-12-20 DIAGNOSIS — N870 Mild cervical dysplasia: Secondary | ICD-10-CM | POA: Diagnosis not present

## 2017-12-22 ENCOUNTER — Telehealth: Payer: Self-pay

## 2017-12-22 NOTE — Telephone Encounter (Signed)
I am not sure what the question is?  Naproxen 500 mg and cyclobenzaprine 10mg  are standard doses.

## 2017-12-22 NOTE — Telephone Encounter (Signed)
Pt states she went to the urgent care because she fell and the pt states the Dr. At [facility] isnt sure if they gave her the correct amount of medicine due to her weight. Pt was given Naproxen 500mg  and cyclobenzaprine 10mg . Lonia Mad

## 2017-12-24 NOTE — Telephone Encounter (Signed)
Left v/m for pt to call office. We are unsure of what the question is regarding the medications those are the standard doses if pt is still having problems she will need to make an appt to be seen. YRL,RMA

## 2017-12-28 NOTE — Telephone Encounter (Signed)
Patient notified YRL,RMA 

## 2017-12-29 ENCOUNTER — Other Ambulatory Visit: Payer: Self-pay | Admitting: Obstetrics and Gynecology

## 2017-12-29 DIAGNOSIS — R921 Mammographic calcification found on diagnostic imaging of breast: Secondary | ICD-10-CM

## 2017-12-31 DIAGNOSIS — M545 Low back pain: Secondary | ICD-10-CM | POA: Diagnosis not present

## 2018-01-01 ENCOUNTER — Other Ambulatory Visit (HOSPITAL_COMMUNITY)
Admission: RE | Admit: 2018-01-01 | Discharge: 2018-01-01 | Disposition: A | Discharge status: Home / Self Care | Payer: Medicare HMO | Source: Ambulatory Visit | Attending: Psychiatry | Admitting: Psychiatry

## 2018-01-01 DIAGNOSIS — F259 Schizoaffective disorder, unspecified: Secondary | ICD-10-CM | POA: Diagnosis present

## 2018-01-01 DIAGNOSIS — Z79899 Other long term (current) drug therapy: Secondary | ICD-10-CM | POA: Insufficient documentation

## 2018-01-01 LAB — CBC WITH DIFFERENTIAL/PLATELET
Basophils Absolute: 0 10*3/uL (ref 0.0–0.1)
Basophils Relative: 0 %
Eosinophils Absolute: 0 10*3/uL (ref 0.0–0.5)
Eosinophils Relative: 0 %
HCT: 40.7 % (ref 36.0–46.0)
HEMOGLOBIN: 13.6 g/dL (ref 12.0–15.0)
LYMPHS ABS: 3.5 10*3/uL (ref 0.7–4.0)
Lymphocytes Relative: 45 %
MCH: 30.8 pg (ref 26.0–34.0)
MCHC: 33.4 g/dL (ref 30.0–36.0)
MCV: 92.3 fL (ref 80.0–100.0)
MONO ABS: 0.5 10*3/uL (ref 0.1–1.0)
MONOS PCT: 6 %
NEUTROS ABS: 3.8 10*3/uL (ref 1.7–7.7)
NEUTROS PCT: 49 %
NRBC: 0 /100{WBCs}
Platelets: 253 10*3/uL (ref 150–400)
RBC: 4.41 MIL/uL (ref 3.87–5.11)
RDW: 15.5 % (ref 11.5–15.5)
WBC: 7.8 10*3/uL (ref 4.0–10.5)
nRBC: 0 % (ref 0.0–0.2)

## 2018-01-05 ENCOUNTER — Ambulatory Visit
Admission: RE | Admit: 2018-01-05 | Discharge: 2018-01-05 | Disposition: A | Discharge status: Home / Self Care | Payer: Medicare HMO | Source: Ambulatory Visit | Attending: Obstetrics and Gynecology | Admitting: Obstetrics and Gynecology

## 2018-01-05 ENCOUNTER — Other Ambulatory Visit: Payer: Self-pay | Admitting: Obstetrics and Gynecology

## 2018-01-05 DIAGNOSIS — N6310 Unspecified lump in the right breast, unspecified quadrant: Secondary | ICD-10-CM

## 2018-01-05 DIAGNOSIS — R928 Other abnormal and inconclusive findings on diagnostic imaging of breast: Secondary | ICD-10-CM | POA: Diagnosis not present

## 2018-01-05 DIAGNOSIS — R921 Mammographic calcification found on diagnostic imaging of breast: Secondary | ICD-10-CM

## 2018-01-05 DIAGNOSIS — N6320 Unspecified lump in the left breast, unspecified quadrant: Secondary | ICD-10-CM

## 2018-01-05 DIAGNOSIS — N63 Unspecified lump in unspecified breast: Secondary | ICD-10-CM

## 2018-01-05 DIAGNOSIS — N6323 Unspecified lump in the left breast, lower outer quadrant: Secondary | ICD-10-CM | POA: Diagnosis not present

## 2018-01-05 DIAGNOSIS — N6489 Other specified disorders of breast: Secondary | ICD-10-CM | POA: Diagnosis not present

## 2018-01-07 DIAGNOSIS — D242 Benign neoplasm of left breast: Secondary | ICD-10-CM | POA: Insufficient documentation

## 2018-01-10 DIAGNOSIS — R262 Difficulty in walking, not elsewhere classified: Secondary | ICD-10-CM | POA: Diagnosis not present

## 2018-01-10 DIAGNOSIS — M545 Low back pain: Secondary | ICD-10-CM | POA: Diagnosis not present

## 2018-01-10 DIAGNOSIS — M5416 Radiculopathy, lumbar region: Secondary | ICD-10-CM | POA: Diagnosis not present

## 2018-01-14 DIAGNOSIS — M5416 Radiculopathy, lumbar region: Secondary | ICD-10-CM | POA: Diagnosis not present

## 2018-01-14 DIAGNOSIS — R262 Difficulty in walking, not elsewhere classified: Secondary | ICD-10-CM | POA: Diagnosis not present

## 2018-01-14 DIAGNOSIS — M545 Low back pain: Secondary | ICD-10-CM | POA: Diagnosis not present

## 2018-01-17 DIAGNOSIS — M5416 Radiculopathy, lumbar region: Secondary | ICD-10-CM | POA: Diagnosis not present

## 2018-01-17 DIAGNOSIS — M545 Low back pain: Secondary | ICD-10-CM | POA: Diagnosis not present

## 2018-01-17 DIAGNOSIS — R262 Difficulty in walking, not elsewhere classified: Secondary | ICD-10-CM | POA: Diagnosis not present

## 2018-01-21 DIAGNOSIS — M5416 Radiculopathy, lumbar region: Secondary | ICD-10-CM | POA: Diagnosis not present

## 2018-01-21 DIAGNOSIS — M545 Low back pain: Secondary | ICD-10-CM | POA: Diagnosis not present

## 2018-01-21 DIAGNOSIS — R262 Difficulty in walking, not elsewhere classified: Secondary | ICD-10-CM | POA: Diagnosis not present

## 2018-01-24 DIAGNOSIS — M545 Low back pain: Secondary | ICD-10-CM | POA: Diagnosis not present

## 2018-01-24 DIAGNOSIS — R262 Difficulty in walking, not elsewhere classified: Secondary | ICD-10-CM | POA: Diagnosis not present

## 2018-01-24 DIAGNOSIS — M5416 Radiculopathy, lumbar region: Secondary | ICD-10-CM | POA: Diagnosis not present

## 2018-01-25 DIAGNOSIS — M545 Low back pain: Secondary | ICD-10-CM | POA: Diagnosis not present

## 2018-01-25 DIAGNOSIS — Z6841 Body Mass Index (BMI) 40.0 and over, adult: Secondary | ICD-10-CM | POA: Diagnosis not present

## 2018-01-25 DIAGNOSIS — M5416 Radiculopathy, lumbar region: Secondary | ICD-10-CM | POA: Diagnosis not present

## 2018-01-26 DIAGNOSIS — R262 Difficulty in walking, not elsewhere classified: Secondary | ICD-10-CM | POA: Diagnosis not present

## 2018-01-26 DIAGNOSIS — M545 Low back pain: Secondary | ICD-10-CM | POA: Diagnosis not present

## 2018-01-26 DIAGNOSIS — M5416 Radiculopathy, lumbar region: Secondary | ICD-10-CM | POA: Diagnosis not present

## 2018-01-29 ENCOUNTER — Other Ambulatory Visit (HOSPITAL_COMMUNITY)
Admission: RE | Admit: 2018-01-29 | Discharge: 2018-01-29 | Disposition: A | Discharge status: Home / Self Care | Payer: Medicare HMO | Source: Ambulatory Visit | Attending: Psychiatry | Admitting: Psychiatry

## 2018-01-29 DIAGNOSIS — F259 Schizoaffective disorder, unspecified: Secondary | ICD-10-CM | POA: Diagnosis not present

## 2018-01-29 DIAGNOSIS — Z79899 Other long term (current) drug therapy: Secondary | ICD-10-CM | POA: Insufficient documentation

## 2018-01-29 LAB — CBC WITH DIFFERENTIAL/PLATELET
Abs Immature Granulocytes: 0.07 10*3/uL (ref 0.00–0.07)
BASOS ABS: 0 10*3/uL (ref 0.0–0.1)
Basophils Relative: 0 %
EOS ABS: 0 10*3/uL (ref 0.0–0.5)
Eosinophils Relative: 0 %
HCT: 43.1 % (ref 36.0–46.0)
Hemoglobin: 14.5 g/dL (ref 12.0–15.0)
IMMATURE GRANULOCYTES: 1 %
Lymphocytes Relative: 25 %
Lymphs Abs: 3 10*3/uL (ref 0.7–4.0)
MCH: 30.6 pg (ref 26.0–34.0)
MCHC: 33.6 g/dL (ref 30.0–36.0)
MCV: 90.9 fL (ref 80.0–100.0)
Monocytes Absolute: 0.6 10*3/uL (ref 0.1–1.0)
Monocytes Relative: 5 %
NEUTROS PCT: 69 %
NRBC: 0 % (ref 0.0–0.2)
Neutro Abs: 8.3 10*3/uL — ABNORMAL HIGH (ref 1.7–7.7)
PLATELETS: 258 10*3/uL (ref 150–400)
RBC: 4.74 MIL/uL (ref 3.87–5.11)
RDW: 15.2 % (ref 11.5–15.5)
WBC: 12 10*3/uL — ABNORMAL HIGH (ref 4.0–10.5)

## 2018-02-04 DIAGNOSIS — M5416 Radiculopathy, lumbar region: Secondary | ICD-10-CM | POA: Diagnosis not present

## 2018-02-04 DIAGNOSIS — M545 Low back pain: Secondary | ICD-10-CM | POA: Diagnosis not present

## 2018-02-04 DIAGNOSIS — R262 Difficulty in walking, not elsewhere classified: Secondary | ICD-10-CM | POA: Diagnosis not present

## 2018-02-09 ENCOUNTER — Other Ambulatory Visit: Payer: Self-pay | Admitting: Orthopedic Surgery

## 2018-02-09 DIAGNOSIS — M5416 Radiculopathy, lumbar region: Secondary | ICD-10-CM

## 2018-03-05 ENCOUNTER — Other Ambulatory Visit (HOSPITAL_COMMUNITY)
Admission: RE | Admit: 2018-03-05 | Discharge: 2018-03-05 | Disposition: A | Discharge status: Home / Self Care | Payer: Medicare HMO | Source: Ambulatory Visit | Attending: Psychiatry | Admitting: Psychiatry

## 2018-03-05 DIAGNOSIS — F259 Schizoaffective disorder, unspecified: Secondary | ICD-10-CM | POA: Insufficient documentation

## 2018-03-05 DIAGNOSIS — Z79899 Other long term (current) drug therapy: Secondary | ICD-10-CM | POA: Diagnosis not present

## 2018-03-05 LAB — CBC WITH DIFFERENTIAL/PLATELET
Abs Immature Granulocytes: 0.05 10*3/uL (ref 0.00–0.07)
Basophils Absolute: 0 10*3/uL (ref 0.0–0.1)
Basophils Relative: 0 %
EOS ABS: 0 10*3/uL (ref 0.0–0.5)
EOS PCT: 0 %
HCT: 45.1 % (ref 36.0–46.0)
HEMOGLOBIN: 14.7 g/dL (ref 12.0–15.0)
Immature Granulocytes: 1 %
LYMPHS ABS: 3 10*3/uL (ref 0.7–4.0)
Lymphocytes Relative: 34 %
MCH: 30 pg (ref 26.0–34.0)
MCHC: 32.6 g/dL (ref 30.0–36.0)
MCV: 92 fL (ref 80.0–100.0)
MONOS PCT: 7 %
Monocytes Absolute: 0.6 10*3/uL (ref 0.1–1.0)
Neutro Abs: 5.2 10*3/uL (ref 1.7–7.7)
Neutrophils Relative %: 58 %
Platelets: 300 10*3/uL (ref 150–400)
RBC: 4.9 MIL/uL (ref 3.87–5.11)
RDW: 15 % (ref 11.5–15.5)
WBC: 8.8 10*3/uL (ref 4.0–10.5)
nRBC: 0 % (ref 0.0–0.2)

## 2018-04-01 ENCOUNTER — Ambulatory Visit: Payer: Medicare HMO | Admitting: Nurse Practitioner

## 2018-04-02 ENCOUNTER — Other Ambulatory Visit (HOSPITAL_COMMUNITY)
Admission: RE | Admit: 2018-04-02 | Discharge: 2018-04-02 | Disposition: A | Discharge status: Home / Self Care | Payer: Medicare HMO | Source: Ambulatory Visit | Attending: Psychiatry | Admitting: Psychiatry

## 2018-04-02 DIAGNOSIS — F259 Schizoaffective disorder, unspecified: Secondary | ICD-10-CM | POA: Insufficient documentation

## 2018-04-02 DIAGNOSIS — Z79899 Other long term (current) drug therapy: Secondary | ICD-10-CM | POA: Insufficient documentation

## 2018-04-02 LAB — CBC WITH DIFFERENTIAL/PLATELET
ABS IMMATURE GRANULOCYTES: 0.02 10*3/uL (ref 0.00–0.07)
BASOS ABS: 0 10*3/uL (ref 0.0–0.1)
BASOS PCT: 0 %
Eosinophils Absolute: 0 10*3/uL (ref 0.0–0.5)
Eosinophils Relative: 0 %
HCT: 43.8 % (ref 36.0–46.0)
HEMOGLOBIN: 14.3 g/dL (ref 12.0–15.0)
Immature Granulocytes: 0 %
LYMPHS PCT: 41 %
Lymphs Abs: 3.1 10*3/uL (ref 0.7–4.0)
MCH: 30.6 pg (ref 26.0–34.0)
MCHC: 32.6 g/dL (ref 30.0–36.0)
MCV: 93.8 fL (ref 80.0–100.0)
MONO ABS: 0.6 10*3/uL (ref 0.1–1.0)
Monocytes Relative: 8 %
NEUTROS ABS: 3.9 10*3/uL (ref 1.7–7.7)
Neutrophils Relative %: 51 %
PLATELETS: 277 10*3/uL (ref 150–400)
RBC: 4.67 MIL/uL (ref 3.87–5.11)
RDW: 15.2 % (ref 11.5–15.5)
WBC: 7.6 10*3/uL (ref 4.0–10.5)
nRBC: 0 % (ref 0.0–0.2)

## 2018-04-30 ENCOUNTER — Inpatient Hospital Stay (HOSPITAL_COMMUNITY): Admit: 2018-04-30 | Discharge: 2018-04-30 | Disposition: A | Discharge status: Home / Self Care | Payer: Self-pay

## 2018-04-30 LAB — CBC WITH DIFFERENTIAL/PLATELET
Abs Immature Granulocytes: 0.03 10*3/uL (ref 0.00–0.07)
Basophils Absolute: 0 10*3/uL (ref 0.0–0.1)
Basophils Relative: 0 %
EOS PCT: 2 %
Eosinophils Absolute: 0.1 10*3/uL (ref 0.0–0.5)
HEMATOCRIT: 41 % (ref 36.0–46.0)
Hemoglobin: 13.5 g/dL (ref 12.0–15.0)
Immature Granulocytes: 0 %
LYMPHS PCT: 41 %
Lymphs Abs: 3 10*3/uL (ref 0.7–4.0)
MCH: 30.1 pg (ref 26.0–34.0)
MCHC: 32.9 g/dL (ref 30.0–36.0)
MCV: 91.5 fL (ref 80.0–100.0)
MONO ABS: 0.4 10*3/uL (ref 0.1–1.0)
Monocytes Relative: 6 %
Neutro Abs: 3.7 10*3/uL (ref 1.7–7.7)
Neutrophils Relative %: 51 %
Platelets: 266 10*3/uL (ref 150–400)
RBC: 4.48 MIL/uL (ref 3.87–5.11)
RDW: 14.6 % (ref 11.5–15.5)
WBC: 7.3 10*3/uL (ref 4.0–10.5)
nRBC: 0 % (ref 0.0–0.2)

## 2018-06-04 ENCOUNTER — Other Ambulatory Visit (HOSPITAL_COMMUNITY)
Admit: 2018-06-04 | Discharge: 2018-06-04 | Disposition: A | Discharge status: Home / Self Care | Payer: Medicare HMO | Source: Ambulatory Visit | Attending: Psychiatry | Admitting: Psychiatry

## 2018-06-04 DIAGNOSIS — E871 Hypo-osmolality and hyponatremia: Secondary | ICD-10-CM | POA: Diagnosis not present

## 2018-06-04 LAB — CBC WITH DIFFERENTIAL/PLATELET
Abs Immature Granulocytes: 0.02 10*3/uL (ref 0.00–0.07)
BASOS PCT: 0 %
Basophils Absolute: 0 10*3/uL (ref 0.0–0.1)
EOS PCT: 0 %
Eosinophils Absolute: 0 10*3/uL (ref 0.0–0.5)
HCT: 40.4 % (ref 36.0–46.0)
Hemoglobin: 13.2 g/dL (ref 12.0–15.0)
Immature Granulocytes: 0 %
Lymphocytes Relative: 43 %
Lymphs Abs: 3.2 10*3/uL (ref 0.7–4.0)
MCH: 29.3 pg (ref 26.0–34.0)
MCHC: 32.7 g/dL (ref 30.0–36.0)
MCV: 89.6 fL (ref 80.0–100.0)
MONO ABS: 0.3 10*3/uL (ref 0.1–1.0)
MONOS PCT: 4 %
Neutro Abs: 3.9 10*3/uL (ref 1.7–7.7)
Neutrophils Relative %: 53 %
PLATELETS: 267 10*3/uL (ref 150–400)
RBC: 4.51 MIL/uL (ref 3.87–5.11)
RDW: 14.7 % (ref 11.5–15.5)
WBC: 7.4 10*3/uL (ref 4.0–10.5)
nRBC: 0 % (ref 0.0–0.2)

## 2018-07-02 ENCOUNTER — Other Ambulatory Visit (HOSPITAL_COMMUNITY)
Admission: RE | Admit: 2018-07-02 | Discharge: 2018-07-02 | Disposition: A | Discharge status: Home / Self Care | Payer: Medicare HMO | Source: Ambulatory Visit | Attending: Psychiatry | Admitting: Psychiatry

## 2018-07-02 DIAGNOSIS — Z79899 Other long term (current) drug therapy: Secondary | ICD-10-CM | POA: Diagnosis not present

## 2018-07-02 DIAGNOSIS — F259 Schizoaffective disorder, unspecified: Secondary | ICD-10-CM | POA: Diagnosis not present

## 2018-07-02 LAB — VITAMIN B12: Vitamin B-12: 223 pg/mL (ref 180–914)

## 2018-07-02 LAB — CBC WITH DIFFERENTIAL/PLATELET
Abs Immature Granulocytes: 0.02 10*3/uL (ref 0.00–0.07)
Basophils Absolute: 0 10*3/uL (ref 0.0–0.1)
Basophils Relative: 0 %
Eosinophils Absolute: 0 10*3/uL (ref 0.0–0.5)
Eosinophils Relative: 0 %
HCT: 41.9 % (ref 36.0–46.0)
Hemoglobin: 13.6 g/dL (ref 12.0–15.0)
Immature Granulocytes: 0 %
Lymphocytes Relative: 39 %
Lymphs Abs: 3.2 10*3/uL (ref 0.7–4.0)
MCH: 29.1 pg (ref 26.0–34.0)
MCHC: 32.5 g/dL (ref 30.0–36.0)
MCV: 89.7 fL (ref 80.0–100.0)
Monocytes Absolute: 0.4 10*3/uL (ref 0.1–1.0)
Monocytes Relative: 5 %
Neutro Abs: 4.5 10*3/uL (ref 1.7–7.7)
Neutrophils Relative %: 56 %
Platelets: 241 10*3/uL (ref 150–400)
RBC: 4.67 MIL/uL (ref 3.87–5.11)
RDW: 15.6 % — ABNORMAL HIGH (ref 11.5–15.5)
WBC: 8 10*3/uL (ref 4.0–10.5)
nRBC: 0 % (ref 0.0–0.2)

## 2018-07-02 LAB — COMPREHENSIVE METABOLIC PANEL
ALT: 12 U/L (ref 0–44)
AST: 16 U/L (ref 15–41)
Albumin: 3.7 g/dL (ref 3.5–5.0)
Alkaline Phosphatase: 67 U/L (ref 38–126)
Anion gap: 15 (ref 5–15)
BUN: 9 mg/dL (ref 6–20)
CO2: 22 mmol/L (ref 22–32)
Calcium: 9.1 mg/dL (ref 8.9–10.3)
Chloride: 103 mmol/L (ref 98–111)
Creatinine, Ser: 0.92 mg/dL (ref 0.44–1.00)
GFR calc Af Amer: >60 mL/min (ref >60)
GFR calc non Af Amer: >60 mL/min (ref >60)
Glucose, Bld: 94 mg/dL (ref 70–99)
Potassium: 3.9 mmol/L (ref 3.5–5.1)
Sodium: 140 mmol/L (ref 135–145)
Total Bilirubin: 0.6 mg/dL (ref 0.3–1.2)
Total Protein: 7.4 g/dL (ref 6.5–8.1)

## 2018-07-02 LAB — FOLATE: Folate: 34.7 ng/mL (ref >5.9)

## 2018-07-02 LAB — LIPID PANEL
Cholesterol: 193 mg/dL (ref 0–200)
HDL: 58 mg/dL (ref >40)
LDL Cholesterol: 110 mg/dL — ABNORMAL HIGH (ref 0–99)
Total CHOL/HDL Ratio: 3.3 RATIO
Triglycerides: 123 mg/dL (ref <150)
VLDL: 25 mg/dL (ref 0–40)

## 2018-07-02 LAB — TSH: TSH: 1.135 u[IU]/mL (ref 0.350–4.500)

## 2018-07-02 LAB — VALPROIC ACID LEVEL: Valproic Acid Lvl: 13 ug/mL — ABNORMAL LOW (ref 50.0–100.0)

## 2018-07-02 LAB — HEMOGLOBIN A1C
Hgb A1c MFr Bld: 5.7 % — ABNORMAL HIGH (ref 4.8–5.6)
Mean Plasma Glucose: 116.89 mg/dL

## 2018-07-04 LAB — VITAMIN D 25 HYDROXY (VIT D DEFICIENCY, FRACTURES): Vit D, 25-Hydroxy: 32.9 ng/mL (ref 30.0–100.0)

## 2018-07-11 ENCOUNTER — Other Ambulatory Visit: Payer: Self-pay | Admitting: Obstetrics

## 2018-07-11 ENCOUNTER — Other Ambulatory Visit: Payer: Self-pay

## 2018-07-11 ENCOUNTER — Ambulatory Visit
Admission: RE | Admit: 2018-07-11 | Discharge: 2018-07-11 | Disposition: A | Discharge status: Home / Self Care | Payer: Medicare HMO | Source: Ambulatory Visit | Attending: Obstetrics and Gynecology | Admitting: Obstetrics and Gynecology

## 2018-07-11 DIAGNOSIS — N63 Unspecified lump in unspecified breast: Secondary | ICD-10-CM

## 2018-07-11 DIAGNOSIS — N6320 Unspecified lump in the left breast, unspecified quadrant: Secondary | ICD-10-CM

## 2018-07-11 DIAGNOSIS — N6042 Mammary duct ectasia of left breast: Secondary | ICD-10-CM | POA: Diagnosis not present

## 2018-07-12 ENCOUNTER — Other Ambulatory Visit: Payer: Medicare HMO

## 2018-07-30 ENCOUNTER — Other Ambulatory Visit (HOSPITAL_COMMUNITY)
Admission: RE | Admit: 2018-07-30 | Discharge: 2018-07-30 | Disposition: A | Discharge status: Home / Self Care | Payer: Medicare HMO | Source: Ambulatory Visit | Attending: Psychiatry | Admitting: Psychiatry

## 2018-07-30 DIAGNOSIS — F329 Major depressive disorder, single episode, unspecified: Secondary | ICD-10-CM | POA: Insufficient documentation

## 2018-07-30 DIAGNOSIS — Z79891 Long term (current) use of opiate analgesic: Secondary | ICD-10-CM | POA: Diagnosis not present

## 2018-07-30 LAB — CBC WITH DIFFERENTIAL/PLATELET
Abs Immature Granulocytes: 0.03 10*3/uL (ref 0.00–0.07)
Basophils Absolute: 0 10*3/uL (ref 0.0–0.1)
Basophils Relative: 0 %
Eosinophils Absolute: 0.1 10*3/uL (ref 0.0–0.5)
Eosinophils Relative: 2 %
HCT: 41 % (ref 36.0–46.0)
Hemoglobin: 13.7 g/dL (ref 12.0–15.0)
Immature Granulocytes: 0 %
Lymphocytes Relative: 37 %
Lymphs Abs: 2.7 10*3/uL (ref 0.7–4.0)
MCH: 30.2 pg (ref 26.0–34.0)
MCHC: 33.4 g/dL (ref 30.0–36.0)
MCV: 90.5 fL (ref 80.0–100.0)
Monocytes Absolute: 0.5 10*3/uL (ref 0.1–1.0)
Monocytes Relative: 7 %
Neutro Abs: 3.9 10*3/uL (ref 1.7–7.7)
Neutrophils Relative %: 54 %
Platelets: 249 10*3/uL (ref 150–400)
RBC: 4.53 MIL/uL (ref 3.87–5.11)
RDW: 16.5 % — ABNORMAL HIGH (ref 11.5–15.5)
WBC: 7.3 10*3/uL (ref 4.0–10.5)
nRBC: 0 % (ref 0.0–0.2)

## 2018-08-02 ENCOUNTER — Other Ambulatory Visit (HOSPITAL_COMMUNITY): Payer: Self-pay

## 2018-09-03 ENCOUNTER — Other Ambulatory Visit (HOSPITAL_COMMUNITY)
Admission: AD | Admit: 2018-09-03 | Discharge: 2018-09-03 | Disposition: A | Discharge status: Home / Self Care | Payer: Medicare HMO | Source: Ambulatory Visit | Attending: Psychiatry | Admitting: Psychiatry

## 2018-09-03 DIAGNOSIS — Z79899 Other long term (current) drug therapy: Secondary | ICD-10-CM | POA: Insufficient documentation

## 2018-09-03 DIAGNOSIS — F259 Schizoaffective disorder, unspecified: Secondary | ICD-10-CM | POA: Diagnosis not present

## 2018-09-03 LAB — CBC WITH DIFFERENTIAL/PLATELET
Abs Immature Granulocytes: 0.03 10*3/uL (ref 0.00–0.07)
Basophils Absolute: 0 10*3/uL (ref 0.0–0.1)
Basophils Relative: 0 %
Eosinophils Absolute: 0 10*3/uL (ref 0.0–0.5)
Eosinophils Relative: 0 %
HCT: 35.8 % — ABNORMAL LOW (ref 36.0–46.0)
Hemoglobin: 12.7 g/dL (ref 12.0–15.0)
Immature Granulocytes: 0 %
Lymphocytes Relative: 36 %
Lymphs Abs: 3 10*3/uL (ref 0.7–4.0)
MCH: 30.9 pg (ref 26.0–34.0)
MCHC: 35.5 g/dL (ref 30.0–36.0)
MCV: 87.1 fL (ref 80.0–100.0)
Monocytes Absolute: 0.4 10*3/uL (ref 0.1–1.0)
Monocytes Relative: 5 %
Neutro Abs: 4.9 10*3/uL (ref 1.7–7.7)
Neutrophils Relative %: 59 %
Platelets: 266 10*3/uL (ref 150–400)
RBC: 4.11 MIL/uL (ref 3.87–5.11)
RDW: 15.5 % (ref 11.5–15.5)
WBC: 8.4 10*3/uL (ref 4.0–10.5)
nRBC: 0 % (ref 0.0–0.2)

## 2018-10-01 ENCOUNTER — Other Ambulatory Visit (HOSPITAL_COMMUNITY)
Admission: AD | Admit: 2018-10-01 | Discharge: 2018-10-01 | Disposition: A | Discharge status: Home / Self Care | Payer: Medicare HMO | Attending: Psychiatry | Admitting: Psychiatry

## 2018-10-01 DIAGNOSIS — F259 Schizoaffective disorder, unspecified: Secondary | ICD-10-CM | POA: Insufficient documentation

## 2018-10-01 LAB — CBC WITH DIFFERENTIAL/PLATELET
Abs Immature Granulocytes: 0.02 10*3/uL (ref 0.00–0.07)
Basophils Absolute: 0 10*3/uL (ref 0.0–0.1)
Basophils Relative: 0 %
Eosinophils Absolute: 0 10*3/uL (ref 0.0–0.5)
Eosinophils Relative: 0 %
HCT: 37.2 % (ref 36.0–46.0)
Hemoglobin: 12.7 g/dL (ref 12.0–15.0)
Immature Granulocytes: 0 %
Lymphocytes Relative: 38 %
Lymphs Abs: 2.7 10*3/uL (ref 0.7–4.0)
MCH: 30.8 pg (ref 26.0–34.0)
MCHC: 34.1 g/dL (ref 30.0–36.0)
MCV: 90.1 fL (ref 80.0–100.0)
Monocytes Absolute: 0.4 10*3/uL (ref 0.1–1.0)
Monocytes Relative: 6 %
Neutro Abs: 4.1 10*3/uL (ref 1.7–7.7)
Neutrophils Relative %: 56 %
Platelets: 256 10*3/uL (ref 150–400)
RBC: 4.13 MIL/uL (ref 3.87–5.11)
RDW: 14.8 % (ref 11.5–15.5)
WBC: 7.3 10*3/uL (ref 4.0–10.5)
nRBC: 0 % (ref 0.0–0.2)

## 2018-10-12 ENCOUNTER — Ambulatory Visit (INDEPENDENT_AMBULATORY_CARE_PROVIDER_SITE_OTHER): Payer: Medicare HMO | Admitting: Nurse Practitioner

## 2018-10-12 ENCOUNTER — Encounter: Payer: Medicare HMO | Admitting: Nurse Practitioner

## 2018-10-12 ENCOUNTER — Ambulatory Visit: Payer: Medicare HMO

## 2018-10-12 ENCOUNTER — Encounter: Payer: Self-pay | Admitting: Nurse Practitioner

## 2018-10-12 ENCOUNTER — Other Ambulatory Visit: Payer: Self-pay

## 2018-10-12 ENCOUNTER — Ambulatory Visit (INDEPENDENT_AMBULATORY_CARE_PROVIDER_SITE_OTHER): Payer: Medicare HMO

## 2018-10-12 VITALS — BP 138/80 | HR 105 | Temp 97.9°F | Ht 62.0 in | Wt 279.0 lb

## 2018-10-12 DIAGNOSIS — Z0000 Encounter for general adult medical examination without abnormal findings: Secondary | ICD-10-CM | POA: Diagnosis not present

## 2018-10-12 DIAGNOSIS — F209 Schizophrenia, unspecified: Secondary | ICD-10-CM | POA: Diagnosis not present

## 2018-10-12 DIAGNOSIS — R7309 Other abnormal glucose: Secondary | ICD-10-CM | POA: Diagnosis not present

## 2018-10-12 DIAGNOSIS — E785 Hyperlipidemia, unspecified: Secondary | ICD-10-CM | POA: Diagnosis not present

## 2018-10-12 DIAGNOSIS — F3170 Bipolar disorder, currently in remission, most recent episode unspecified: Secondary | ICD-10-CM | POA: Diagnosis not present

## 2018-10-12 DIAGNOSIS — Z79899 Other long term (current) drug therapy: Secondary | ICD-10-CM | POA: Diagnosis not present

## 2018-10-12 NOTE — Progress Notes (Signed)
Subjective:     Patient ID: Brenda Hernandez , female    DOB: [birth year] , 43 y.o.   MRN: [id number]   Chief Complaint  Patient presents with  . Annual Exam   The patient states she uses OCP (estrogen/progesterone) for birth control. Last LMP was No LMP recorded.. Negative for Dysmenorrhea and Negative for Menorrhagia Mammogram last done 01/05/2018.  Negative for: breast discharge, breast lump(s), breast pain and breast self exam.  Pertinent negatives include abnormal bleeding (hematology), anxiety, decreased libido, depression, difficulty falling sleep, dyspareunia, history of infertility, nocturia, sexual dysfunction, sleep disturbances, urinary incontinence, urinary urgency, vaginal discharge and vaginal itching. Diet regular.The patient states her exercise level is      The patient's tobacco use is:  Social History   Tobacco Use  Smoking Status Never Smoker  Smokeless Tobacco Never Used   She has been exposed to passive smoke. The patient's alcohol use is:  Social History   Substance and Sexual Activity  Alcohol Use No   Additional information: Dr. Leo Grosser - Last pap unknown.    HPI  She is followed by Dr. Wannetta Sender ([place]) for schizoaffective and bipolar    Past Medical History:  Diagnosis Date  . Abnormal pap   . Schizophrenia ([place])      Family History  Problem Relation Age of Onset  . Diabetes Maternal Grandmother   . Diabetes Maternal Grandfather   . Diabetes Paternal Grandmother   . Diabetes Paternal Grandfather   . Breast cancer Maternal Aunt   . Breast cancer Paternal Aunt      Current Outpatient Medications:  .  clomiPRAMINE (ANAFRANIL) 75 MG capsule, Take 75 mg by mouth 2 (two) times daily., Disp: , Rfl:  .  cloZAPine (CLOZARIL) 100 MG tablet, Take 500 mg by mouth daily., Disp: , Rfl:  .  levonorgestrel-ethinyl estradiol (AVIANE) 0.1-20 MG-MCG tablet, Take 1 tablet by mouth daily. (Patient not taking: Reported on 10/12/2018), Disp: 1  Package, Rfl: 12 .  levonorgestrel-ethinyl estradiol (SRONYX) 0.1-20 MG-MCG tablet, Sronyx 0.1 mg-20 mcg tablet  TAKE 1 TABLET BY MOUTH EVERY DAY, Disp: , Rfl:  .  Magnesium Citrate (CITRATE OF MAGNESIA) 1.745 GM/30ML SOLN, Take by mouth., Disp: , Rfl:  .  metFORMIN (GLUCOPHAGE) 1000 MG tablet, Take 1,000 mg by mouth 2 (two) times daily with a meal., Disp: , Rfl:  .  ondansetron (ZOFRAN ODT) 4 MG disintegrating tablet, Take 1 every 6 or 8 hours as needed for nausea or vomiting (Patient not taking: Reported on 10/12/2018), Disp: 10 tablet, Rfl: 0 .  pyridOXINE (VITAMIN B-6) 100 MG tablet, Take 100 mg by mouth daily. Reported on 07/02/2015, Disp: , Rfl:  .  valproate (DEPAKENE) 250 MG/5ML syrup, Take by mouth at bedtime. 50 ml nightly, Disp: , Rfl:    Allergies  Allergen Reactions  . Benadryl [Diphenhydramine]   . Citric Acid      Review of Systems  Constitutional: Negative.   HENT: Negative.   Eyes: Negative.   Respiratory: Negative.   Cardiovascular: Negative.   Gastrointestinal: Negative.   Endocrine: Negative.   Genitourinary: Negative.   Musculoskeletal: Negative.   Skin: Negative.   Allergic/Immunologic: Negative.   Neurological: Negative.   Hematological: Negative.   Psychiatric/Behavioral: Negative.      Today's Vitals   10/12/18 1425  BP: 138/80  Pulse: (!) 105  Temp: 97.9 F (36.6 C)  TempSrc: Oral  Weight: 279 lb (126.6 kg)  Height: 5\' 2"  (1.575 m)   Body mass index  is 51.03 kg/m.   Objective:  Physical Exam Constitutional:      Appearance: Normal appearance. She is well-developed. She is obese.  HENT:     Head: Normocephalic and atraumatic.     Right Ear: Hearing, tympanic membrane, ear canal and external ear normal.     Left Ear: Hearing, tympanic membrane, ear canal and external ear normal.  Eyes:     General: Lids are normal.     Extraocular Movements: Extraocular movements intact.     Conjunctiva/sclera: Conjunctivae normal.     Pupils: Pupils are  equal, round, and reactive to light.     Funduscopic exam:    Right eye: No papilledema.        Left eye: No papilledema.  Neck:     Musculoskeletal: Full passive range of motion without pain, normal range of motion and neck supple.     Thyroid: No thyroid mass.     Vascular: No carotid bruit.  Cardiovascular:     Rate and Rhythm: Normal rate and regular rhythm.     Pulses: Normal pulses.     Heart sounds: Normal heart sounds. No murmur.  Pulmonary:     Effort: Pulmonary effort is normal.     Breath sounds: Normal breath sounds.  Abdominal:     General: Abdomen is flat. Bowel sounds are normal.     Palpations: Abdomen is soft.  Musculoskeletal: Normal range of motion.        General: No swelling.     Right lower leg: No edema.     Left lower leg: No edema.  Skin:    General: Skin is warm and dry.     Capillary Refill: Capillary refill takes less than 2 seconds.  Neurological:     General: No focal deficit present.     Mental Status: She is alert and oriented to person, place, and time.     Cranial Nerves: No cranial nerve deficit.     Sensory: No sensory deficit.  Psychiatric:        Mood and Affect: Mood normal.        Behavior: Behavior normal.        Thought Content: Thought content normal.        Judgment: Judgment normal.         Assessment And Plan:    1. Health maintenance examination . Behavior modifications discussed and diet history reviewed.   . Pt will continue to exercise regularly and modify diet with low GI, plant based foods and decrease intake of processed foods.  . Recommend intake of daily multivitamin, Vitamin D, and calcium.  . Recommend for preventive screenings, as well as recommend immunizations that include TDAP  2. Bipolar affective disorder in remission [facility])  She is being followed by Dr. Leward Quan in [place] doing well and has routine labs   Minette Brine, FNP    THE PATIENT IS ENCOURAGED TO PRACTICE SOCIAL DISTANCING DUE  TO THE COVID-19 PANDEMIC.

## 2018-10-12 NOTE — Progress Notes (Signed)
Subjective:   Brenda Hernandez is a 43 y.o. female who presents for Medicare Annual (Subsequent) preventive examination.  Review of Systems:  n/a Cardiac Risk Factors include: obesity (BMI >30kg/m2);sedentary lifestyle     Objective:     Vitals: BP 138/80 (BP Location: Left Arm, Patient Position: Sitting, Cuff Size: Large)   Pulse (!) 105   Temp 97.9 F (36.6 C) (Oral)   Ht 5\' 2"  (1.575 m)   Wt 279 lb (126.6 kg)   SpO2 97%   BMI 51.03 kg/m   Body mass index is 51.03 kg/m.  Advanced Directives 10/12/2018  Does Patient Have a Medical Advance Directive? No    Tobacco Social History   Tobacco Use  Smoking Status Never Smoker  Smokeless Tobacco Never Used     Counseling given: Not Answered   Clinical Intake:  Pre-visit preparation completed: Yes  Pain : No/denies pain     Nutritional Status: BMI > 30  Obese Nutritional Risks: None Diabetes: No  How often do you need to have someone help you when you read instructions, pamphlets, or other written materials from your doctor or pharmacy?: 1 - Never What is the last grade level you completed in school?: college  Interpreter Needed?: No  Information entered by :: NAllen LPN  Past Medical History:  Diagnosis Date  . Abnormal pap   . Schizophrenia ([place])    History reviewed. No pertinent surgical history. Family History  Problem Relation Age of Onset  . Diabetes Maternal Grandmother   . Diabetes Maternal Grandfather   . Diabetes Paternal Grandmother   . Diabetes Paternal Grandfather   . Breast cancer Maternal Aunt   . Breast cancer Paternal Aunt    Social History   Socioeconomic History  . Marital status: Divorced    Spouse name: Not on file  . Number of children: Not on file  . Years of education: Not on file  . Highest education level: Not on file  Occupational History  . Occupation: disability  Social Needs  . Financial resource strain: Not hard at all  . Food insecurity    Worry: Never true     Inability: Never true  . Transportation needs    Medical: No    Non-medical: No  Tobacco Use  . Smoking status: Never Smoker  . Smokeless tobacco: Never Used  Substance and Sexual Activity  . Alcohol use: No  . Drug use: No  . Sexual activity: Not Currently    Birth control/protection: None, Abstinence  Lifestyle  . Physical activity    Days per week: 5 days    Minutes per session: 40 min  . Stress: Not at all  Relationships  . Social Herbalist on phone: Not on file    Gets together: Not on file    Attends religious service: Not on file    Active member of club or organization: Not on file    Attends meetings of clubs or organizations: Not on file    Relationship status: Not on file  Other Topics Concern  . Not on file  Social History Narrative  . Not on file    Outpatient Encounter Medications as of 10/12/2018  Medication Sig  . clomiPRAMINE (ANAFRANIL) 75 MG capsule Take 75 mg by mouth 2 (two) times daily.  . cloZAPine (CLOZARIL) 100 MG tablet Take 500 mg by mouth daily.  Marland Kitchen levonorgestrel-ethinyl estradiol (SRONYX) 0.1-20 MG-MCG tablet Sronyx 0.1 mg-20 mcg tablet  TAKE 1 TABLET BY MOUTH EVERY  DAY  . metFORMIN (GLUCOPHAGE) 1000 MG tablet Take 1,000 mg by mouth 2 (two) times daily with a meal.  . valproate (DEPAKENE) 250 MG/5ML syrup Take by mouth at bedtime. 50 ml nightly  . levonorgestrel-ethinyl estradiol (AVIANE) 0.1-20 MG-MCG tablet Take 1 tablet by mouth daily. (Patient not taking: Reported on 10/12/2018)  . Magnesium Citrate (CITRATE OF MAGNESIA) 1.745 GM/30ML SOLN Take by mouth.  . ondansetron (ZOFRAN ODT) 4 MG disintegrating tablet Take 1 every 6 or 8 hours as needed for nausea or vomiting (Patient not taking: Reported on 10/12/2018)  . pyridOXINE (VITAMIN B-6) 100 MG tablet Take 100 mg by mouth daily. Reported on 07/02/2015   No facility-administered encounter medications on file as of 10/12/2018.     Activities of Daily Living In your present state  of health, do you have any difficulty performing the following activities: 10/12/2018  Hearing? N  Vision? N  Difficulty concentrating or making decisions? N  Walking or climbing stairs? N  Dressing or bathing? N  Doing errands, shopping? N  Preparing Food and eating ? N  Using the Toilet? N  In the past six months, have you accidently leaked urine? N  Do you have problems with loss of bowel control? N  Managing your Medications? N  Managing your Finances? N  Housekeeping or managing your Housekeeping? N  Some recent data might be hidden    Patient Care Team: Minette Brine, FNP as PCP - General (General Practice)    Assessment:   This is a routine wellness examination for Brenda Hernandez.  Exercise Activities and Dietary recommendations Current Exercise Habits: The patient does not participate in regular exercise at present  Goals    . Weight (lb) < 200 lb (90.7 kg)     10/12/2018, would like to lose 50 pounds       Fall Risk Fall Risk  10/12/2018  Falls in the past year? 0  Risk for fall due to : Medication side effect  Follow up Falls evaluation completed;Education provided;Falls prevention discussed   Is the patient's home free of loose throw rugs in walkways, pet beds, electrical cords, etc?   yes      Grab bars in the bathroom? no      Handrails on the stairs?   yes      Adequate lighting?   yes  Timed Get Up and Go performed: n/a  Depression Screen PHQ 2/9 Scores 10/12/2018 07/02/2015 10/11/2014  PHQ - 2 Score 0 0 0  PHQ- 9 Score 0 - -     Cognitive Function     6CIT Screen 10/12/2018  What Year? 0 points  What month? 0 points  What time? 0 points  Count back from 20 0 points  Months in reverse 0 points  Repeat phrase 0 points  Total Score 0    Immunization History  Administered Date(s) Administered  . PPD Test 05/20/2017    Qualifies for Shingles Vaccine? no  Screening Tests Health Maintenance  Topic Date Due  . HIV Screening  11/15/1992  .  TETANUS/TDAP  11/15/1996  . PAP SMEAR-Modifier  05/05/2015  . INFLUENZA VACCINE  10/15/2018    Cancer Screenings: Lung: Low Dose CT Chest recommended if Age 56-80 years, 30 pack-year currently smoking OR have quit w/in 15years. Patient does not qualify. Breast:  Up to date on Mammogram? Yes   Up to date of Bone Density/Dexa? n/a Colorectal: n/a  Additional Screenings: : Hepatitis C Screening: n/a     Plan:  6 CIT was 0. Patient wants to lose 50 pounds.   I have personally reviewed and noted the following in the patient's chart:   . Medical and social history . Use of alcohol, tobacco or illicit drugs  . Current medications and supplements . Functional ability and status . Nutritional status . Physical activity . Advanced directives . List of other physicians . Hospitalizations, surgeries, and ER visits in previous 12 months . Vitals . Screenings to include cognitive, depression, and falls . Referrals and appointments  In addition, I have reviewed and discussed with patient certain preventive protocols, quality metrics, and best practice recommendations. A written personalized care plan for preventive services as well as general preventive health recommendations were provided to patient.     Kellie Simmering, LPN  4/49/2010

## 2018-10-12 NOTE — Patient Instructions (Signed)
Brenda Hernandez , Thank you for taking time to come for your Medicare Wellness Visit. I appreciate your ongoing commitment to your health goals. Please review the following plan we discussed and let me know if I can assist you in the future.   Screening recommendations/referrals: Colonoscopy: n/a Mammogram: 12/2017 Bone Density: n/a Recommended yearly ophthalmology/optometry visit for glaucoma screening and checkup Recommended yearly dental visit for hygiene and checkup  Vaccinations: Influenza vaccine: 02/2018 Pneumococcal vaccine: n/a Tdap vaccine: 05/2017 Shingles vaccine: n/a    Advanced directives: Advance directive discussed with you today. Even though you declined this today please call our office should you change your mind and we can give you the proper paperwork for you to fill out.   Conditions/risks identified: obesity  Next appointment: 10/12/2018  Preventive Care 40-64 Years, Female Preventive care refers to lifestyle choices and visits with your health care provider that can promote health and wellness. What does preventive care include?  A yearly physical exam. This is also called an annual well check.  Dental exams once or twice a year.  Routine eye exams. Ask your health care provider how often you should have your eyes checked.  Personal lifestyle choices, including:  Daily care of your teeth and gums.  Regular physical activity.  Eating a healthy diet.  Avoiding tobacco and drug use.  Limiting alcohol use.  Practicing safe sex.  Taking low-dose aspirin daily starting at age 81.  Taking vitamin and mineral supplements as recommended by your health care provider. What happens during an annual well check? The services and screenings done by your health care provider during your annual well check will depend on your age, overall health, lifestyle risk factors, and family history of disease. Counseling  Your health care provider may ask you questions about  your:  Alcohol use.  Tobacco use.  Drug use.  Emotional well-being.  Home and relationship well-being.  Sexual activity.  Eating habits.  Work and work Statistician.  Method of birth control.  Menstrual cycle.  Pregnancy history. Screening  You may have the following tests or measurements:  Height, weight, and BMI.  Blood pressure.  Lipid and cholesterol levels. These may be checked every 5 years, or more frequently if you are over 100 years old.  Skin check.  Lung cancer screening. You may have this screening every year starting at age 36 if you have a 30-pack-year history of smoking and currently smoke or have quit within the past 15 years.  Fecal occult blood test (FOBT) of the stool. You may have this test every year starting at age 74.  Flexible sigmoidoscopy or colonoscopy. You may have a sigmoidoscopy every 5 years or a colonoscopy every 10 years starting at age 48.  Hepatitis C blood test.  Hepatitis B blood test.  Sexually transmitted disease (STD) testing.  Diabetes screening. This is done by checking your blood sugar (glucose) after you have not eaten for a while (fasting). You may have this done every 1-3 years.  Mammogram. This may be done every 1-2 years. Talk to your health care provider about when you should start having regular mammograms. This may depend on whether you have a family history of breast cancer.  BRCA-related cancer screening. This may be done if you have a family history of breast, ovarian, tubal, or peritoneal cancers.  Pelvic exam and Pap test. This may be done every 3 years starting at age 54. Starting at age 28, this may be done every 5 years if you  have a Pap test in combination with an HPV test.  Bone density scan. This is done to screen for osteoporosis. You may have this scan if you are at high risk for osteoporosis. Discuss your test results, treatment options, and if necessary, the need for more tests with your health care  provider. Vaccines  Your health care provider may recommend certain vaccines, such as:  Influenza vaccine. This is recommended every year.  Tetanus, diphtheria, and acellular pertussis (Tdap, Td) vaccine. You may need a Td booster every 10 years.  Zoster vaccine. You may need this after age 78.  Pneumococcal 13-valent conjugate (PCV13) vaccine. You may need this if you have certain conditions and were not previously vaccinated.  Pneumococcal polysaccharide (PPSV23) vaccine. You may need one or two doses if you smoke cigarettes or if you have certain conditions. Talk to your health care provider about which screenings and vaccines you need and how often you need them. This information is not intended to replace advice given to you by your health care provider. Make sure you discuss any questions you have with your health care provider. Document Released: 03/29/2015 Document Revised: 11/20/2015 Document Reviewed: 01/01/2015 Elsevier Interactive Patient Education  2017 [employer] Prevention in the Home Falls can cause injuries. They can happen to people of all ages. There are many things you can do to make your home safe and to help prevent falls. What can I do on the outside of my home?  Regularly fix the edges of walkways and driveways and fix any cracks.  Remove anything that might make you trip as you walk through a door, such as a raised step or threshold.  Trim any bushes or trees on the path to your home.  Use bright outdoor lighting.  Clear any walking paths of anything that might make someone trip, such as rocks or tools.  Regularly check to see if handrails are loose or broken. Make sure that both sides of any steps have handrails.  Any raised decks and porches should have guardrails on the edges.  Have any leaves, snow, or ice cleared regularly.  Use sand or salt on walking paths during winter.  Clean up any spills in your garage right away. This includes  oil or grease spills. What can I do in the bathroom?  Use night lights.  Install grab bars by the toilet and in the tub and shower. Do not use towel bars as grab bars.  Use non-skid mats or decals in the tub or shower.  If you need to sit down in the shower, use a plastic, non-slip stool.  Keep the floor dry. Clean up any water that spills on the floor as soon as it happens.  Remove soap buildup in the tub or shower regularly.  Attach bath mats securely with double-sided non-slip rug tape.  Do not have throw rugs and other things on the floor that can make you trip. What can I do in the bedroom?  Use night lights.  Make sure that you have a light by your bed that is easy to reach.  Do not use any sheets or blankets that are too big for your bed. They should not hang down onto the floor.  Have a firm chair that has side arms. You can use this for support while you get dressed.  Do not have throw rugs and other things on the floor that can make you trip. What can I do in the kitchen?  Clean up any spills right away.  Avoid walking on wet floors.  Keep items that you use a lot in easy-to-reach places.  If you need to reach something above you, use a strong step stool that has a grab bar.  Keep electrical cords out of the way.  Do not use floor polish or wax that makes floors slippery. If you must use wax, use non-skid floor wax.  Do not have throw rugs and other things on the floor that can make you trip. What can I do with my stairs?  Do not leave any items on the stairs.  Make sure that there are handrails on both sides of the stairs and use them. Fix handrails that are broken or loose. Make sure that handrails are as long as the stairways.  Check any carpeting to make sure that it is firmly attached to the stairs. Fix any carpet that is loose or worn.  Avoid having throw rugs at the top or bottom of the stairs. If you do have throw rugs, attach them to the floor  with carpet tape.  Make sure that you have a light switch at the top of the stairs and the bottom of the stairs. If you do not have them, ask someone to add them for you. What else can I do to help prevent falls?  Wear shoes that:  Do not have high heels.  Have rubber bottoms.  Are comfortable and fit you well.  Are closed at the toe. Do not wear sandals.  If you use a stepladder:  Make sure that it is fully opened. Do not climb a closed stepladder.  Make sure that both sides of the stepladder are locked into place.  Ask someone to hold it for you, if possible.  Clearly mark and make sure that you can see:  Any grab bars or handrails.  First and last steps.  Where the edge of each step is.  Use tools that help you move around (mobility aids) if they are needed. These include:  Canes.  Walkers.  Scooters.  Crutches.  Turn on the lights when you go into a dark area. Replace any light bulbs as soon as they burn out.  Set up your furniture so you have a clear path. Avoid moving your furniture around.  If any of your floors are uneven, fix them.  If there are any pets around you, be aware of where they are.  Review your medicines with your doctor. Some medicines can make you feel dizzy. This can increase your chance of falling. Ask your doctor what other things that you can do to help prevent falls. This information is not intended to replace advice given to you by your health care provider. Make sure you discuss any questions you have with your health care provider. Document Released: 12/27/2008 Document Revised: 08/08/2015 Document Reviewed: 04/06/2014 Elsevier Interactive Patient Education  2017 [employer].

## 2018-10-13 LAB — CMP14 + ANION GAP
ALT: 12 IU/L (ref 0–32)
AST: 16 IU/L (ref 0–40)
Albumin/Globulin Ratio: 1.3 (ref 1.2–2.2)
Albumin: 4 g/dL (ref 3.8–4.8)
Alkaline Phosphatase: 71 IU/L (ref 39–117)
Anion Gap: 19 mmol/L — ABNORMAL HIGH (ref 10.0–18.0)
BUN/Creatinine Ratio: 10 (ref 9–23)
BUN: 9 mg/dL (ref 6–24)
Bilirubin Total: 0.3 mg/dL (ref 0.0–1.2)
CO2: 21 mmol/L (ref 20–29)
Calcium: 8.8 mg/dL (ref 8.7–10.2)
Chloride: 101 mmol/L (ref 96–106)
Creatinine, Ser: 0.92 mg/dL (ref 0.57–1.00)
GFR calc Af Amer: 90 mL/min/{1.73_m2} (ref >59)
GFR calc non Af Amer: 78 mL/min/{1.73_m2} (ref >59)
Globulin, Total: 3 g/dL (ref 1.5–4.5)
Glucose: 78 mg/dL (ref 65–99)
Potassium: 4.2 mmol/L (ref 3.5–5.2)
Sodium: 141 mmol/L (ref 134–144)
Total Protein: 7 g/dL (ref 6.0–8.5)

## 2018-10-13 LAB — LIPID PANEL
Chol/HDL Ratio: 3.1 ratio (ref 0.0–4.4)
Cholesterol, Total: 185 mg/dL (ref 100–199)
HDL: 59 mg/dL (ref >39)
LDL Calculated: 104 mg/dL — ABNORMAL HIGH (ref 0–99)
Triglycerides: 108 mg/dL (ref 0–149)
VLDL Cholesterol Cal: 22 mg/dL (ref 5–40)

## 2018-10-13 LAB — HEMOGLOBIN A1C
Est. average glucose Bld gHb Est-mCnc: 105 mg/dL
Hgb A1c MFr Bld: 5.3 % (ref 4.8–5.6)

## 2018-10-29 ENCOUNTER — Other Ambulatory Visit (HOSPITAL_COMMUNITY)
Admission: AD | Admit: 2018-10-29 | Discharge: 2018-10-29 | Disposition: A | Discharge status: Home / Self Care | Payer: Medicare HMO | Attending: Psychiatry | Admitting: Psychiatry

## 2018-10-29 DIAGNOSIS — Z79899 Other long term (current) drug therapy: Secondary | ICD-10-CM | POA: Diagnosis not present

## 2018-10-29 DIAGNOSIS — F259 Schizoaffective disorder, unspecified: Secondary | ICD-10-CM | POA: Insufficient documentation

## 2018-10-29 LAB — CBC WITH DIFFERENTIAL/PLATELET
Abs Immature Granulocytes: 0.02 10*3/uL (ref 0.00–0.07)
Basophils Absolute: 0 10*3/uL (ref 0.0–0.1)
Basophils Relative: 0 %
Eosinophils Absolute: 0 10*3/uL (ref 0.0–0.5)
Eosinophils Relative: 0 %
HCT: 38 % (ref 36.0–46.0)
Hemoglobin: 12.9 g/dL (ref 12.0–15.0)
Immature Granulocytes: 0 %
Lymphocytes Relative: 33 %
Lymphs Abs: 2.7 10*3/uL (ref 0.7–4.0)
MCH: 30.8 pg (ref 26.0–34.0)
MCHC: 33.9 g/dL (ref 30.0–36.0)
MCV: 90.7 fL (ref 80.0–100.0)
Monocytes Absolute: 0.5 10*3/uL (ref 0.1–1.0)
Monocytes Relative: 6 %
Neutro Abs: 4.9 10*3/uL (ref 1.7–7.7)
Neutrophils Relative %: 61 %
Platelets: 255 10*3/uL (ref 150–400)
RBC: 4.19 MIL/uL (ref 3.87–5.11)
RDW: 15 % (ref 11.5–15.5)
WBC: 8.2 10*3/uL (ref 4.0–10.5)
nRBC: 0 % (ref 0.0–0.2)

## 2018-10-29 LAB — LIPID PANEL
Cholesterol: 157 mg/dL (ref 0–200)
HDL: 55 mg/dL (ref >40)
LDL Cholesterol: 90 mg/dL (ref 0–99)
Total CHOL/HDL Ratio: 2.9 RATIO
Triglycerides: 59 mg/dL (ref <150)
VLDL: 12 mg/dL (ref 0–40)

## 2018-10-29 LAB — COMPREHENSIVE METABOLIC PANEL
ALT: 11 U/L (ref 0–44)
AST: 15 U/L (ref 15–41)
Albumin: 3.1 g/dL — ABNORMAL LOW (ref 3.5–5.0)
Alkaline Phosphatase: 55 U/L (ref 38–126)
Anion gap: 11 (ref 5–15)
BUN: 10 mg/dL (ref 6–20)
CO2: 22 mmol/L (ref 22–32)
Calcium: 8.6 mg/dL — ABNORMAL LOW (ref 8.9–10.3)
Chloride: 105 mmol/L (ref 98–111)
Creatinine, Ser: 0.9 mg/dL (ref 0.44–1.00)
GFR calc Af Amer: >60 mL/min (ref >60)
GFR calc non Af Amer: >60 mL/min (ref >60)
Glucose, Bld: 90 mg/dL (ref 70–99)
Potassium: 4.2 mmol/L (ref 3.5–5.1)
Sodium: 138 mmol/L (ref 135–145)
Total Bilirubin: 0.3 mg/dL (ref 0.3–1.2)
Total Protein: 6.7 g/dL (ref 6.5–8.1)

## 2018-10-29 LAB — TSH: TSH: 0.908 u[IU]/mL (ref 0.350–4.500)

## 2018-10-29 LAB — HEMOGLOBIN A1C
Hgb A1c MFr Bld: 5.4 % (ref 4.8–5.6)
Mean Plasma Glucose: 108.28 mg/dL

## 2018-10-29 LAB — FOLATE: Folate: UNDETERMINED ng/mL (ref >5.9)

## 2018-10-29 LAB — VITAMIN B12: Vitamin B-12: 161 pg/mL — ABNORMAL LOW (ref 180–914)

## 2018-11-04 LAB — VITAMIN D 1,25 DIHYDROXY
Vitamin D 1, 25 (OH)2 Total: 62 pg/mL
Vitamin D2 1, 25 (OH)2: <10 pg/mL
Vitamin D3 1, 25 (OH)2: 62 pg/mL

## 2018-11-10 ENCOUNTER — Encounter: Payer: Self-pay | Admitting: Nurse Practitioner

## 2018-12-03 ENCOUNTER — Other Ambulatory Visit: Payer: Self-pay

## 2018-12-03 ENCOUNTER — Other Ambulatory Visit (HOSPITAL_COMMUNITY)
Admission: AD | Admit: 2018-12-03 | Discharge: 2018-12-03 | Disposition: A | Discharge status: Home / Self Care | Payer: Medicare HMO | Source: Ambulatory Visit | Attending: Psychiatry | Admitting: Psychiatry

## 2018-12-03 DIAGNOSIS — F259 Schizoaffective disorder, unspecified: Secondary | ICD-10-CM | POA: Diagnosis not present

## 2018-12-03 LAB — CBC
HCT: 38.2 % (ref 36.0–46.0)
Hemoglobin: 13.4 g/dL (ref 12.0–15.0)
MCH: 31.3 pg (ref 26.0–34.0)
MCHC: 35.1 g/dL (ref 30.0–36.0)
MCV: 89.3 fL (ref 80.0–100.0)
Platelets: 262 10*3/uL (ref 150–400)
RBC: 4.28 MIL/uL (ref 3.87–5.11)
RDW: 15.3 % (ref 11.5–15.5)
WBC: 7.6 10*3/uL (ref 4.0–10.5)
nRBC: 0 % (ref 0.0–0.2)

## 2018-12-31 ENCOUNTER — Other Ambulatory Visit (HOSPITAL_COMMUNITY)
Admission: AD | Admit: 2018-12-31 | Discharge: 2018-12-31 | Disposition: A | Discharge status: Home / Self Care | Payer: Medicare HMO | Attending: Psychiatry | Admitting: Psychiatry

## 2018-12-31 DIAGNOSIS — F259 Schizoaffective disorder, unspecified: Secondary | ICD-10-CM | POA: Diagnosis not present

## 2018-12-31 DIAGNOSIS — Z79899 Other long term (current) drug therapy: Secondary | ICD-10-CM | POA: Insufficient documentation

## 2018-12-31 LAB — HEMOGLOBIN A1C
Hgb A1c MFr Bld: 5.5 % (ref 4.8–5.6)
Mean Plasma Glucose: 111.15 mg/dL

## 2018-12-31 LAB — VALPROIC ACID LEVEL: Valproic Acid Lvl: <10 ug/mL — ABNORMAL LOW (ref 50.0–100.0)

## 2018-12-31 LAB — CBC WITH DIFFERENTIAL/PLATELET
Abs Immature Granulocytes: 0.03 10*3/uL (ref 0.00–0.07)
Basophils Absolute: 0 10*3/uL (ref 0.0–0.1)
Basophils Relative: 0 %
Eosinophils Absolute: 0 10*3/uL (ref 0.0–0.5)
Eosinophils Relative: 0 %
HCT: 41.6 % (ref 36.0–46.0)
Hemoglobin: 14.5 g/dL (ref 12.0–15.0)
Immature Granulocytes: 0 %
Lymphocytes Relative: 32 %
Lymphs Abs: 2.5 10*3/uL (ref 0.7–4.0)
MCH: 31 pg (ref 26.0–34.0)
MCHC: 34.9 g/dL (ref 30.0–36.0)
MCV: 88.9 fL (ref 80.0–100.0)
Monocytes Absolute: 0.4 10*3/uL (ref 0.1–1.0)
Monocytes Relative: 5 %
Neutro Abs: 4.9 10*3/uL (ref 1.7–7.7)
Neutrophils Relative %: 63 %
Platelets: 289 10*3/uL (ref 150–400)
RBC: 4.68 MIL/uL (ref 3.87–5.11)
RDW: 15.5 % (ref 11.5–15.5)
WBC: 7.8 10*3/uL (ref 4.0–10.5)
nRBC: 0 % (ref 0.0–0.2)

## 2018-12-31 LAB — LIPID PANEL
Cholesterol: 181 mg/dL (ref 0–200)
HDL: 65 mg/dL (ref >40)
LDL Cholesterol: 101 mg/dL — ABNORMAL HIGH (ref 0–99)
Total CHOL/HDL Ratio: 2.8 RATIO
Triglycerides: 75 mg/dL (ref <150)
VLDL: 15 mg/dL (ref 0–40)

## 2018-12-31 LAB — COMPREHENSIVE METABOLIC PANEL
ALT: 12 U/L (ref 0–44)
AST: 15 U/L (ref 15–41)
Albumin: 3.4 g/dL — ABNORMAL LOW (ref 3.5–5.0)
Alkaline Phosphatase: 61 U/L (ref 38–126)
Anion gap: 12 (ref 5–15)
BUN: 8 mg/dL (ref 6–20)
CO2: 23 mmol/L (ref 22–32)
Calcium: 8.9 mg/dL (ref 8.9–10.3)
Chloride: 103 mmol/L (ref 98–111)
Creatinine, Ser: 0.81 mg/dL (ref 0.44–1.00)
GFR calc Af Amer: >60 mL/min (ref >60)
GFR calc non Af Amer: >60 mL/min (ref >60)
Glucose, Bld: 89 mg/dL (ref 70–99)
Potassium: 4 mmol/L (ref 3.5–5.1)
Sodium: 138 mmol/L (ref 135–145)
Total Bilirubin: 0.4 mg/dL (ref 0.3–1.2)
Total Protein: 7.3 g/dL (ref 6.5–8.1)

## 2018-12-31 LAB — FOLATE: Folate: 20.6 ng/mL (ref >5.9)

## 2018-12-31 LAB — VITAMIN D 25 HYDROXY (VIT D DEFICIENCY, FRACTURES): Vit D, 25-Hydroxy: 23.46 ng/mL — ABNORMAL LOW (ref 30–100)

## 2018-12-31 LAB — VITAMIN B12: Vitamin B-12: 144 pg/mL — ABNORMAL LOW (ref 180–914)

## 2018-12-31 LAB — TSH: TSH: 1.108 u[IU]/mL (ref 0.350–4.500)

## 2019-01-10 ENCOUNTER — Ambulatory Visit
Admission: RE | Admit: 2019-01-10 | Discharge: 2019-01-10 | Disposition: A | Discharge status: Home / Self Care | Payer: Medicare HMO | Source: Ambulatory Visit | Attending: Obstetrics | Admitting: Obstetrics

## 2019-01-10 ENCOUNTER — Other Ambulatory Visit: Payer: Medicare HMO

## 2019-01-10 ENCOUNTER — Other Ambulatory Visit: Payer: Self-pay

## 2019-01-10 DIAGNOSIS — N6323 Unspecified lump in the left breast, lower outer quadrant: Secondary | ICD-10-CM | POA: Diagnosis not present

## 2019-01-10 DIAGNOSIS — N6320 Unspecified lump in the left breast, unspecified quadrant: Secondary | ICD-10-CM

## 2019-01-10 DIAGNOSIS — R922 Inconclusive mammogram: Secondary | ICD-10-CM | POA: Diagnosis not present

## 2019-01-10 LAB — HM MAMMOGRAPHY: HM Mammogram: NORMAL (ref 0–4)

## 2019-01-17 DIAGNOSIS — N870 Mild cervical dysplasia: Secondary | ICD-10-CM | POA: Diagnosis not present

## 2019-01-17 DIAGNOSIS — R87610 Atypical squamous cells of undetermined significance on cytologic smear of cervix (ASC-US): Secondary | ICD-10-CM | POA: Diagnosis not present

## 2019-01-17 DIAGNOSIS — Z01419 Encounter for gynecological examination (general) (routine) without abnormal findings: Secondary | ICD-10-CM | POA: Diagnosis not present

## 2019-01-17 DIAGNOSIS — R87810 Cervical high risk human papillomavirus (HPV) DNA test positive: Secondary | ICD-10-CM | POA: Diagnosis not present

## 2019-01-17 DIAGNOSIS — Z3040 Encounter for surveillance of contraceptives, unspecified: Secondary | ICD-10-CM | POA: Diagnosis not present

## 2019-01-17 DIAGNOSIS — R87612 Low grade squamous intraepithelial lesion on cytologic smear of cervix (LGSIL): Secondary | ICD-10-CM | POA: Diagnosis not present

## 2019-01-17 DIAGNOSIS — Z6841 Body Mass Index (BMI) 40.0 and over, adult: Secondary | ICD-10-CM | POA: Diagnosis not present

## 2019-01-17 IMAGING — MG DIGITAL DIAGNOSTIC BILATERAL MAMMOGRAM WITH TOMO AND CAD
6 of 10 series · 6 of 30 positions shown · non-contrast
Comparison: 12/08/2017 from [REDACTED] OBGYN

CLINICAL DATA: Patient presents for further evaluation of both
breasts after screening study. There is question of asymmetry in the
retroareolar region of the RIGHT breast. A possible mass is
identified in the LATERAL portion of the LEFT breast.

EXAM:
DIGITAL DIAGNOSTIC BILATERAL MAMMOGRAM WITH CAD AND TOMO
ULTRASOUND BILATERAL BREAST

[L CC synth-2D (1 of 2)]
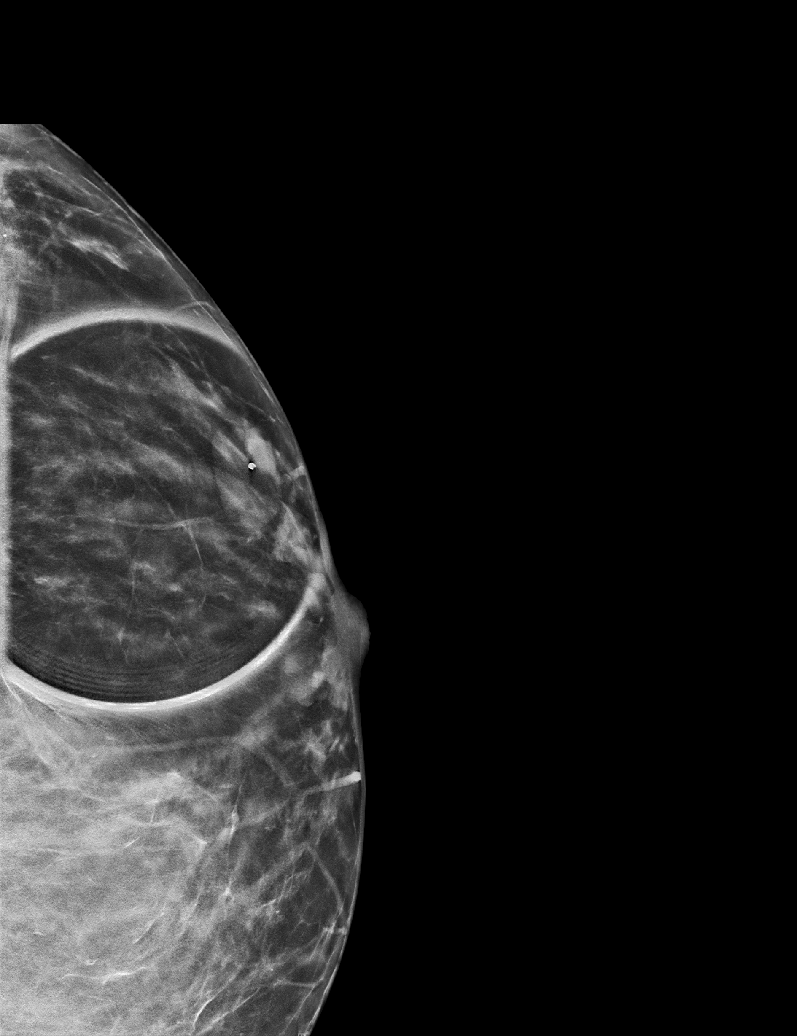

[L CC synth-2D (2 of 2)]
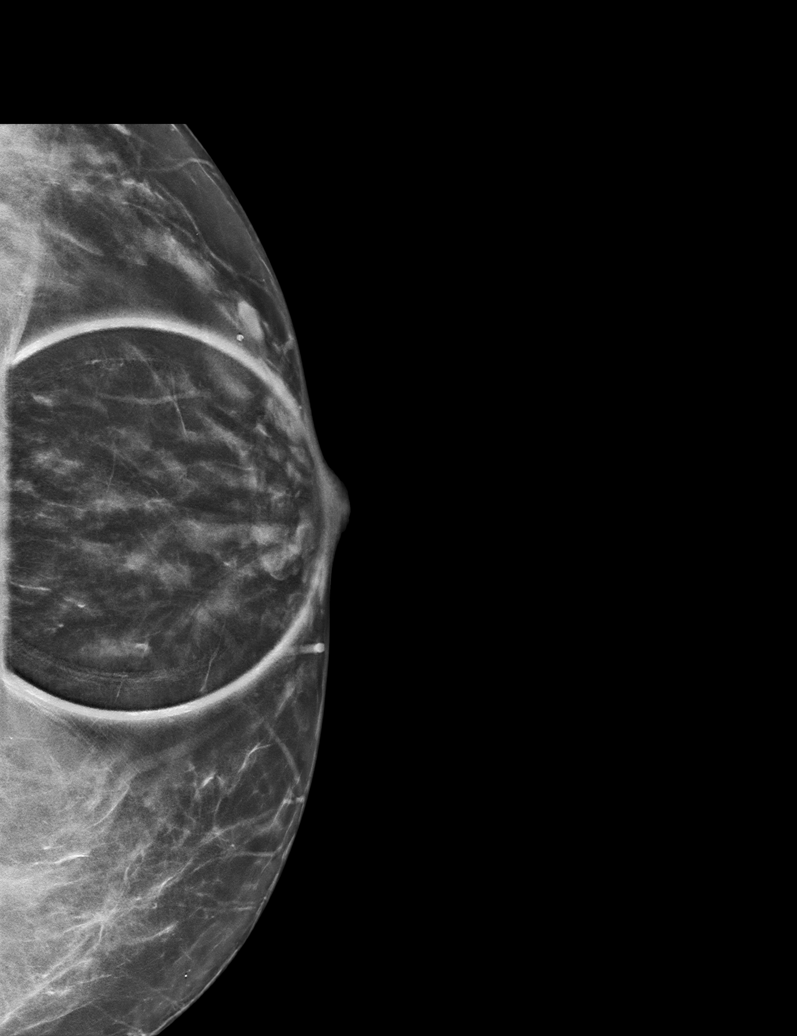

[R ML synth-2D]
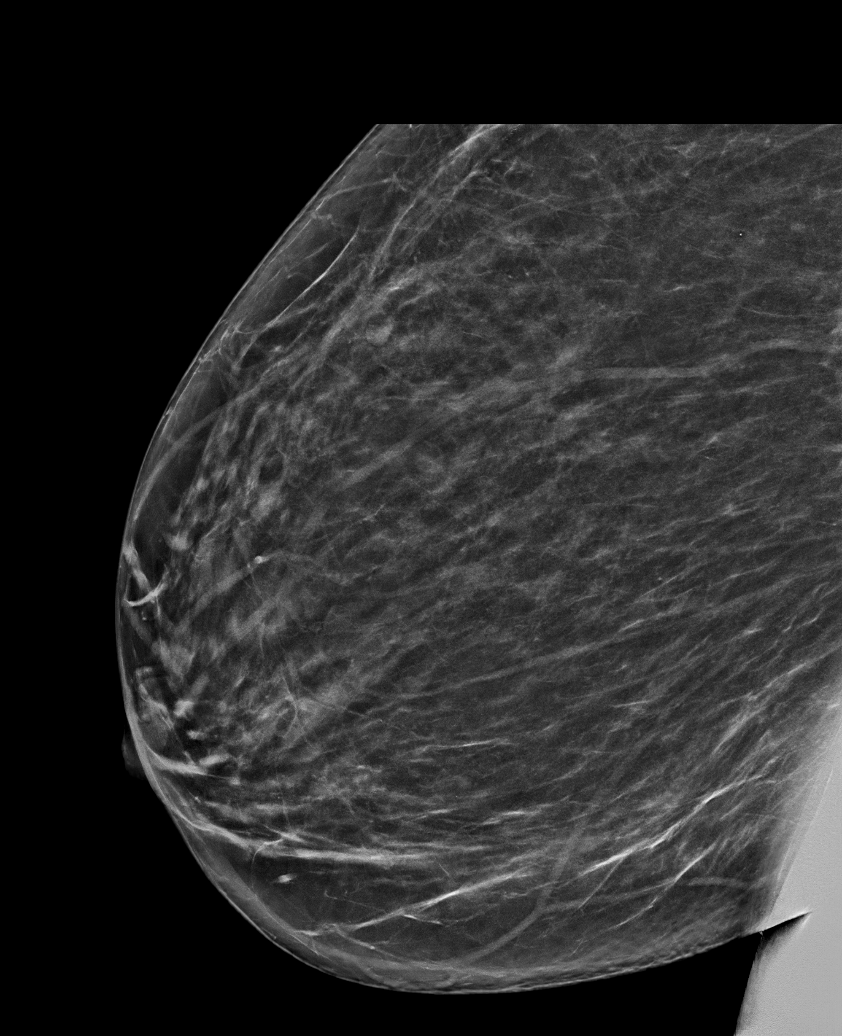

[R MLO synth-2D]
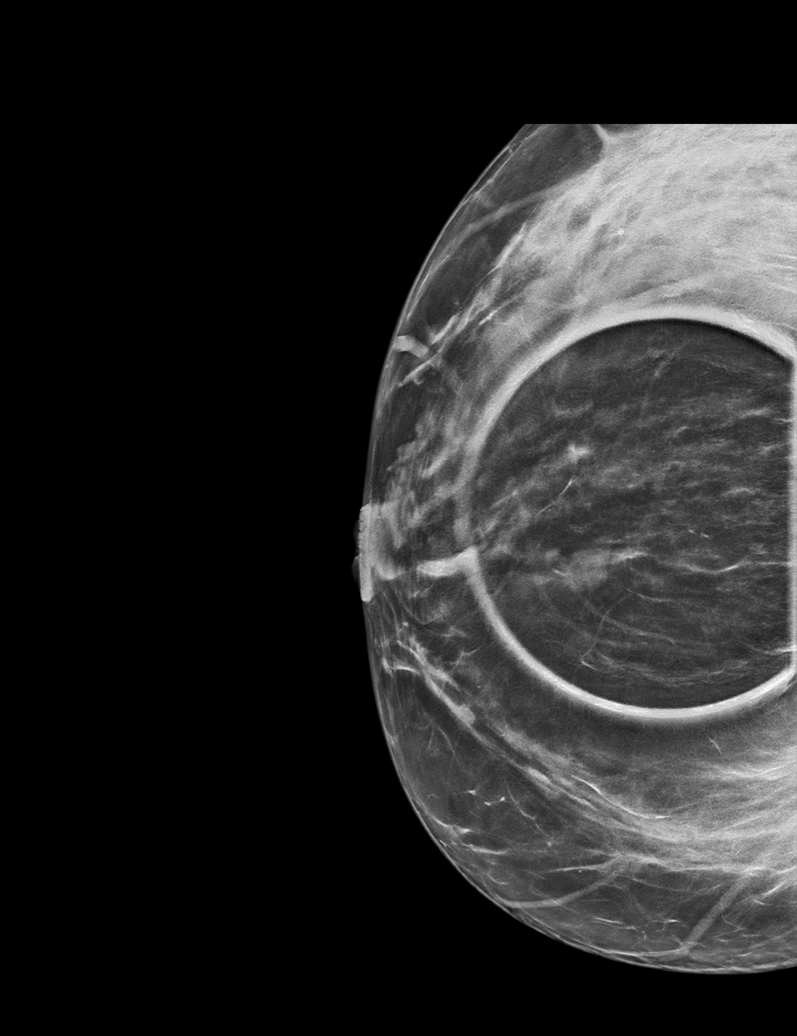

[L MLO synth-2D]
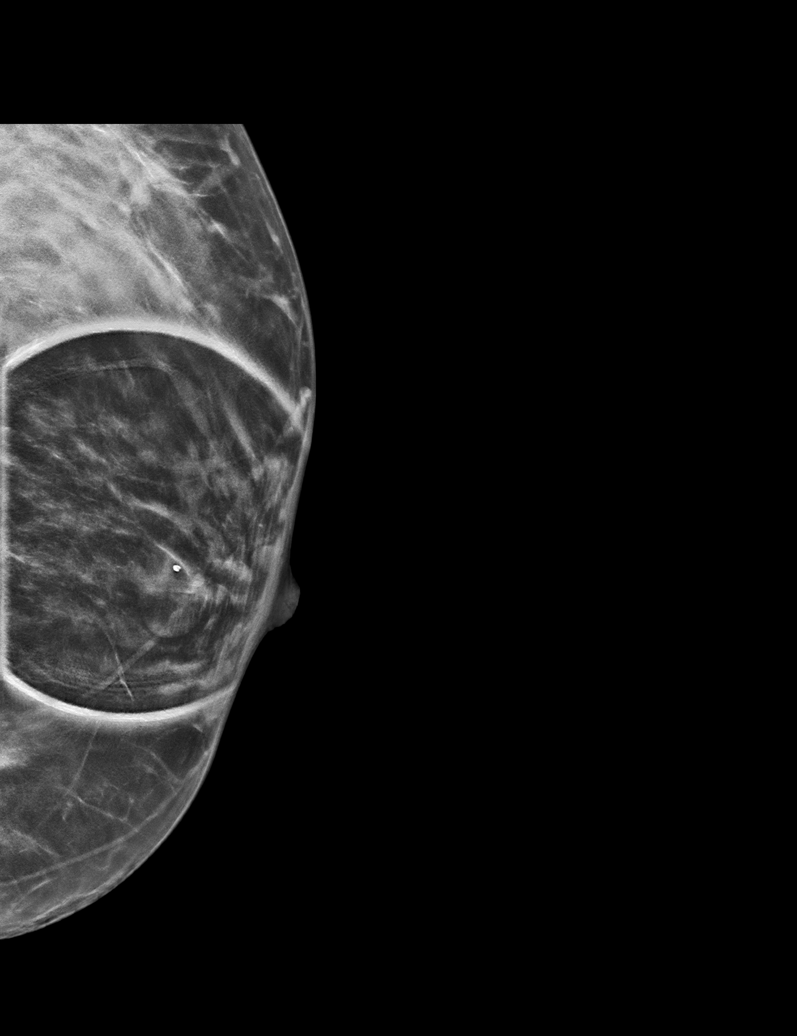

[R ML tomo · tomo slice 41/80.0]
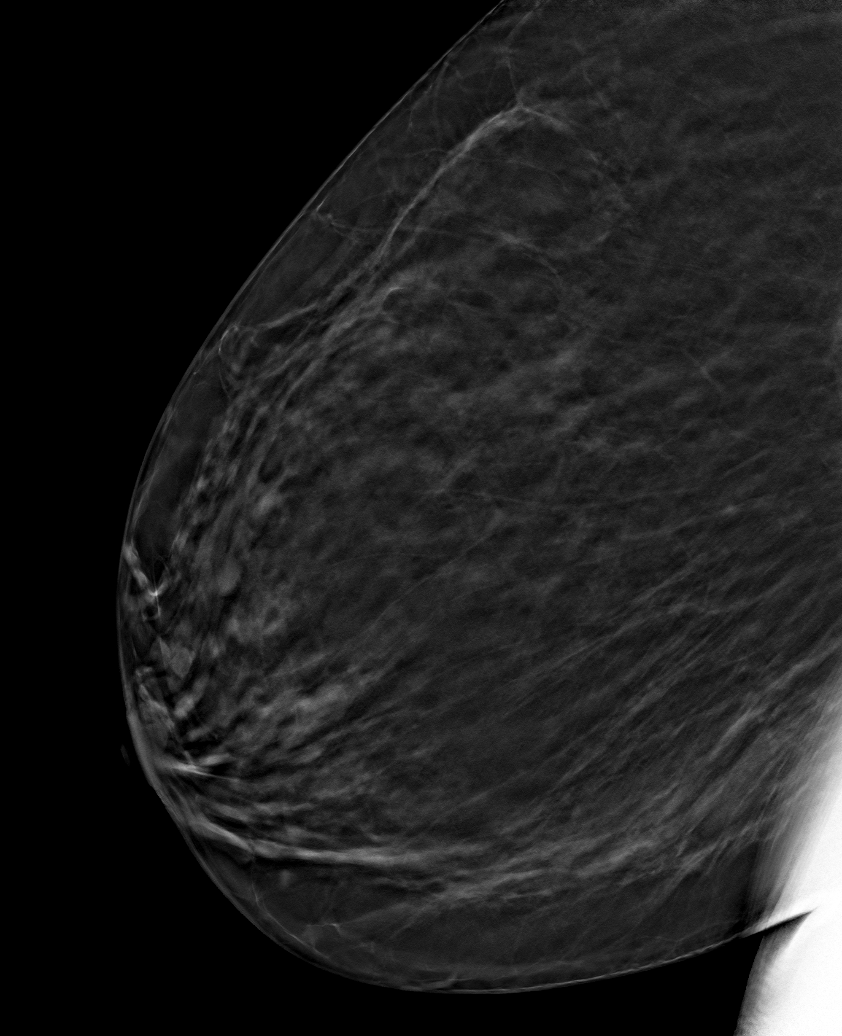

[6 of 30 positions shown; findings below may reference images not displayed]

ACR Breast Density Category b: There are scattered areas of
fibroglandular density.
FINDINGS: Right breast:

Mammogram: Additional views are performed, confirming presence of an
oval circumscribed isodense mass in the immediate retroareolar
region of the RIGHT breast, measuring approximately 2.1 centimeters.
Mammographic images were processed with CAD.

Ultrasound: Targeted ultrasound is performed, showing mildly ectatic
ducts in the immediate retroareolar region of the RIGHT breast. No
discrete mass identified.

Left breast:

Mammogram: Additional 2-D and 3-D images are performed. Views
confirm presence of a circumscribed oval mass just LATERAL to the
LEFT nipple, associated with a single rounded calcification.
Mammographic images were processed with CAD.

Physical Exam: I palpate no abnormality LATERAL to the LEFT nipple.

Ultrasound: Targeted ultrasound is performed, showing a
circumscribed oval hypoechoic mass with posterior acoustic
enhancement in the 3:30 o'clock location of the LEFT breast 3
centimeters from the nipple. Mass is 1.4 x 0.6 x 1.1 centimeters. No
internal blood flow identified on Doppler evaluation.
IMPRESSION: 1. Benign duct ectasia likely accounting for the possible
mammographic abnormality.
2. Small probable fibroadenoma in the LATERAL portion of the LEFT
breast. We discussed management options including excision, biopsy,
and close follow-up. Imaging followup is recommended at 6, 12, and
24 months to assess stability. The patient concurs with this plan.

RECOMMENDATION:
LEFT breast ultrasound is recommended in 6 months.

I have discussed the findings and recommendations with the patient.
Results were also provided in writing at the conclusion of the
visit. If applicable, a reminder letter will be sent to the patient
regarding the next appointment.

BI-RADS CATEGORY  3: Probably benign.

## 2019-01-17 IMAGING — US ULTRASOUND RIGHT BREAST LIMITED
1 series · 3 of 3 positions shown · non-contrast
Comparison: 12/08/2017 from [REDACTED] OBGYN

CLINICAL DATA: Patient presents for further evaluation of both
breasts after screening study. There is question of asymmetry in the
retroareolar region of the RIGHT breast. A possible mass is
identified in the LATERAL portion of the LEFT breast.

EXAM:
DIGITAL DIAGNOSTIC BILATERAL MAMMOGRAM WITH CAD AND TOMO
ULTRASOUND BILATERAL BREAST

[Series 1: ultrasound right breast limited · 0.07mm/px · 3 of 3 slices shown]
[im 1/3]
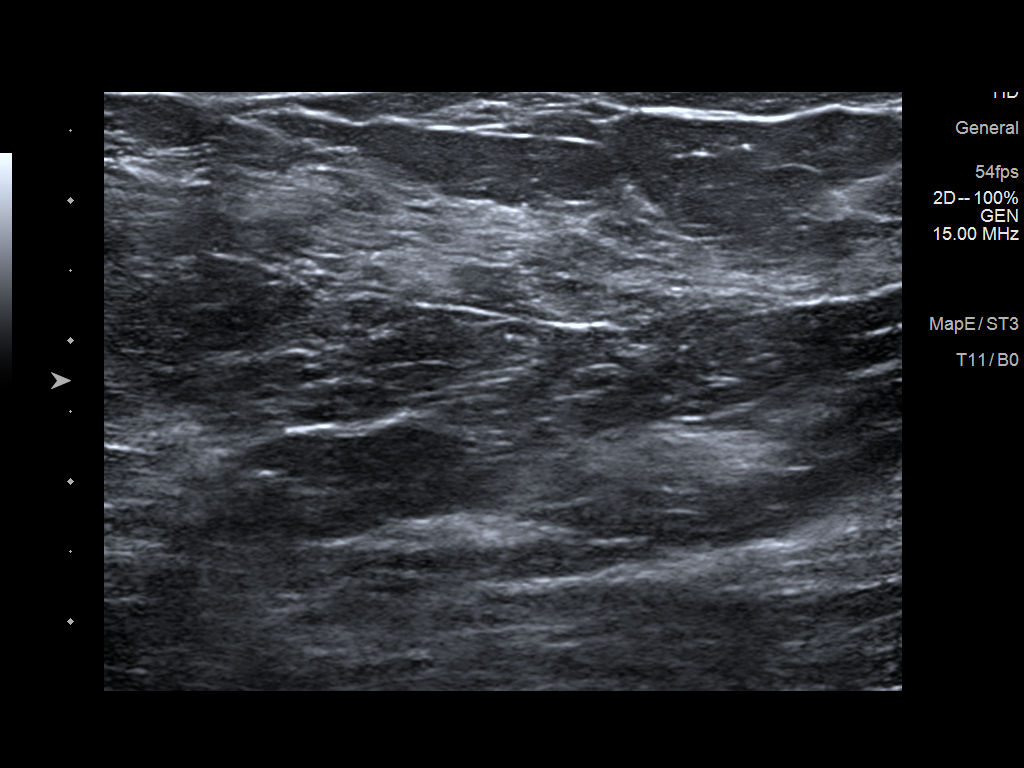
[im 2/3]
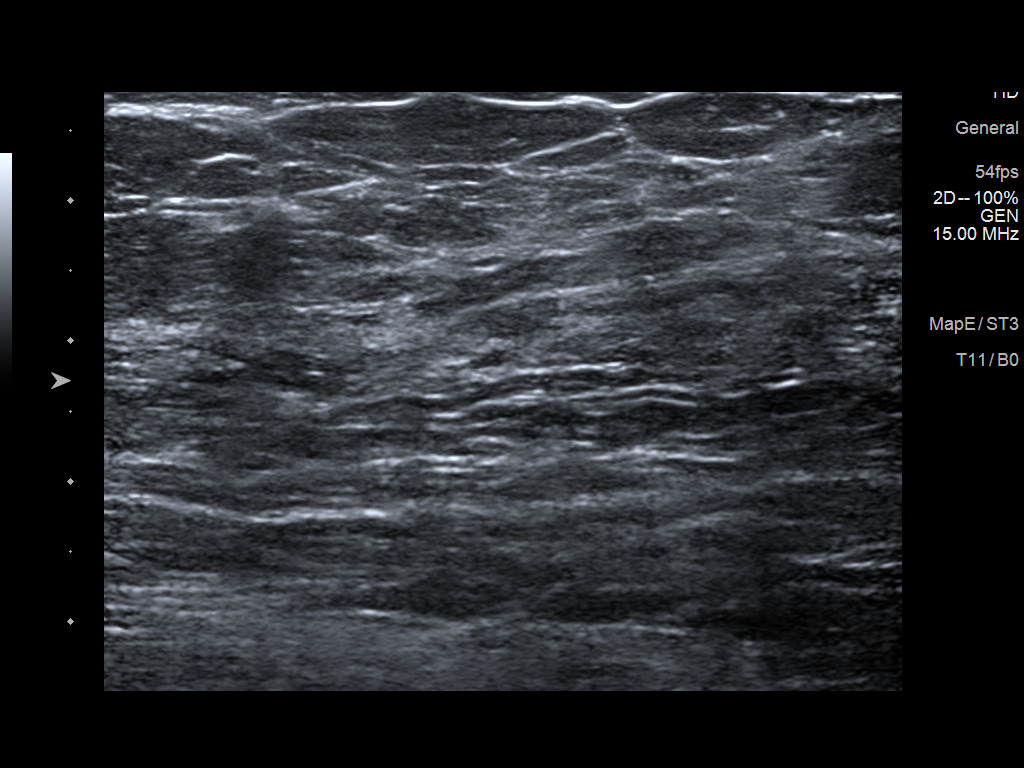
[im 3/3]
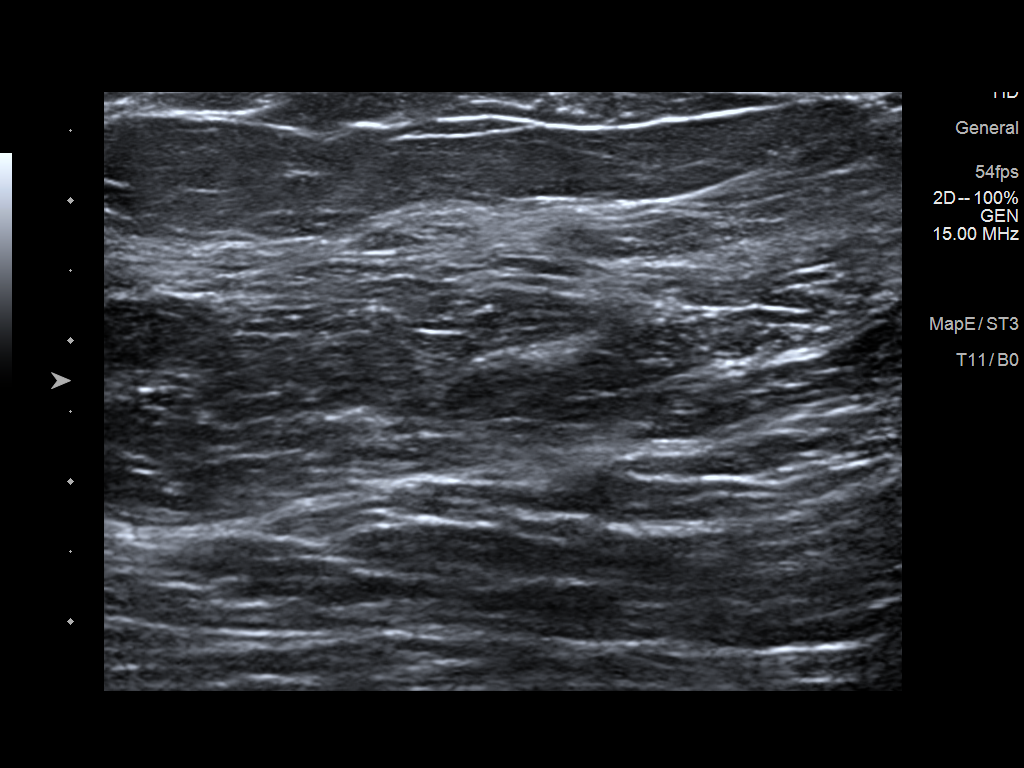

[3 of 3 positions shown; findings below may reference images not displayed]

ACR Breast Density Category b: There are scattered areas of
fibroglandular density.
FINDINGS: Right breast:

Mammogram: Additional views are performed, confirming presence of an
oval circumscribed isodense mass in the immediate retroareolar
region of the RIGHT breast, measuring approximately 2.1 centimeters.
Mammographic images were processed with CAD.

Ultrasound: Targeted ultrasound is performed, showing mildly ectatic
ducts in the immediate retroareolar region of the RIGHT breast. No
discrete mass identified.

Left breast:

Mammogram: Additional 2-D and 3-D images are performed. Views
confirm presence of a circumscribed oval mass just LATERAL to the
LEFT nipple, associated with a single rounded calcification.
Mammographic images were processed with CAD.

Physical Exam: I palpate no abnormality LATERAL to the LEFT nipple.

Ultrasound: Targeted ultrasound is performed, showing a
circumscribed oval hypoechoic mass with posterior acoustic
enhancement in the 3:30 o'clock location of the LEFT breast 3
centimeters from the nipple. Mass is 1.4 x 0.6 x 1.1 centimeters. No
internal blood flow identified on Doppler evaluation.
IMPRESSION: 1. Benign duct ectasia likely accounting for the possible
mammographic abnormality.
2. Small probable fibroadenoma in the LATERAL portion of the LEFT
breast. We discussed management options including excision, biopsy,
and close follow-up. Imaging followup is recommended at 6, 12, and
24 months to assess stability. The patient concurs with this plan.

RECOMMENDATION:
LEFT breast ultrasound is recommended in 6 months.

I have discussed the findings and recommendations with the patient.
Results were also provided in writing at the conclusion of the
visit. If applicable, a reminder letter will be sent to the patient
regarding the next appointment.

BI-RADS CATEGORY  3: Probably benign.

## 2019-01-19 ENCOUNTER — Encounter: Payer: Self-pay | Admitting: Nurse Practitioner

## 2019-02-04 ENCOUNTER — Other Ambulatory Visit (HOSPITAL_COMMUNITY): Admission: AD | Admit: 2019-02-04 | Payer: Medicare HMO | Source: Ambulatory Visit | Admitting: Psychiatry

## 2019-02-04 ENCOUNTER — Other Ambulatory Visit: Payer: Self-pay

## 2019-02-04 ENCOUNTER — Other Ambulatory Visit (HOSPITAL_COMMUNITY)
Admission: RE | Admit: 2019-02-04 | Discharge: 2019-02-04 | Disposition: A | Discharge status: Home / Self Care | Payer: Medicare HMO | Attending: Psychiatry | Admitting: Psychiatry

## 2019-02-04 DIAGNOSIS — F259 Schizoaffective disorder, unspecified: Secondary | ICD-10-CM | POA: Insufficient documentation

## 2019-02-04 DIAGNOSIS — Z79899 Other long term (current) drug therapy: Secondary | ICD-10-CM | POA: Insufficient documentation

## 2019-02-04 LAB — CBC WITH DIFFERENTIAL/PLATELET
Abs Immature Granulocytes: 0.01 10*3/uL (ref 0.00–0.07)
Basophils Absolute: 0 10*3/uL (ref 0.0–0.1)
Basophils Relative: 0 %
Eosinophils Absolute: 0 10*3/uL (ref 0.0–0.5)
Eosinophils Relative: 0 %
HCT: 41.1 % (ref 36.0–46.0)
Hemoglobin: 13.8 g/dL (ref 12.0–15.0)
Immature Granulocytes: 0 %
Lymphocytes Relative: 37 %
Lymphs Abs: 2.5 10*3/uL (ref 0.7–4.0)
MCH: 30.2 pg (ref 26.0–34.0)
MCHC: 33.6 g/dL (ref 30.0–36.0)
MCV: 89.9 fL (ref 80.0–100.0)
Monocytes Absolute: 0.4 10*3/uL (ref 0.1–1.0)
Monocytes Relative: 6 %
Neutro Abs: 3.8 10*3/uL (ref 1.7–7.7)
Neutrophils Relative %: 57 %
Platelets: 292 10*3/uL (ref 150–400)
RBC: 4.57 MIL/uL (ref 3.87–5.11)
RDW: 15.8 % — ABNORMAL HIGH (ref 11.5–15.5)
WBC: 6.7 10*3/uL (ref 4.0–10.5)
nRBC: 0 % (ref 0.0–0.2)

## 2019-02-22 DIAGNOSIS — N879 Dysplasia of cervix uteri, unspecified: Secondary | ICD-10-CM | POA: Diagnosis not present

## 2019-02-22 DIAGNOSIS — R87612 Low grade squamous intraepithelial lesion on cytologic smear of cervix (LGSIL): Secondary | ICD-10-CM | POA: Diagnosis not present

## 2019-02-22 DIAGNOSIS — N870 Mild cervical dysplasia: Secondary | ICD-10-CM | POA: Diagnosis not present

## 2019-02-22 DIAGNOSIS — B977 Papillomavirus as the cause of diseases classified elsewhere: Secondary | ICD-10-CM | POA: Diagnosis not present

## 2019-02-27 LAB — HM PAP SMEAR: HM Pap smear: ABNORMAL

## 2019-02-28 ENCOUNTER — Encounter: Payer: Self-pay | Admitting: Nurse Practitioner

## 2019-03-04 ENCOUNTER — Other Ambulatory Visit (HOSPITAL_COMMUNITY)
Admission: AD | Admit: 2019-03-04 | Discharge: 2019-03-04 | Disposition: A | Discharge status: Home / Self Care | Payer: Medicare HMO | Source: Ambulatory Visit | Attending: Psychiatry | Admitting: Psychiatry

## 2019-03-04 DIAGNOSIS — F259 Schizoaffective disorder, unspecified: Secondary | ICD-10-CM | POA: Diagnosis not present

## 2019-03-04 LAB — CBC WITH DIFFERENTIAL/PLATELET
Abs Immature Granulocytes: 0.05 10*3/uL (ref 0.00–0.07)
Basophils Absolute: 0 10*3/uL (ref 0.0–0.1)
Basophils Relative: 0 %
Eosinophils Absolute: 0 10*3/uL (ref 0.0–0.5)
Eosinophils Relative: 0 %
HCT: 36.9 % (ref 36.0–46.0)
Hemoglobin: 12.7 g/dL (ref 12.0–15.0)
Immature Granulocytes: 1 %
Lymphocytes Relative: 34 %
Lymphs Abs: 3.1 10*3/uL (ref 0.7–4.0)
MCH: 30.2 pg (ref 26.0–34.0)
MCHC: 34.4 g/dL (ref 30.0–36.0)
MCV: 87.9 fL (ref 80.0–100.0)
Monocytes Absolute: 0.5 10*3/uL (ref 0.1–1.0)
Monocytes Relative: 6 %
Neutro Abs: 5.4 10*3/uL (ref 1.7–7.7)
Neutrophils Relative %: 59 %
Platelets: 301 10*3/uL (ref 150–400)
RBC: 4.2 MIL/uL (ref 3.87–5.11)
RDW: 15.7 % — ABNORMAL HIGH (ref 11.5–15.5)
WBC: 9 10*3/uL (ref 4.0–10.5)
nRBC: 0 % (ref 0.0–0.2)

## 2019-04-01 ENCOUNTER — Other Ambulatory Visit (HOSPITAL_COMMUNITY)
Admission: AD | Admit: 2019-04-01 | Discharge: 2019-04-01 | Disposition: A | Discharge status: Home / Self Care | Payer: Medicare HMO | Source: Ambulatory Visit | Attending: Psychiatry | Admitting: Psychiatry

## 2019-04-01 DIAGNOSIS — F259 Schizoaffective disorder, unspecified: Secondary | ICD-10-CM | POA: Insufficient documentation

## 2019-04-01 LAB — CBC WITH DIFFERENTIAL/PLATELET
Abs Immature Granulocytes: 0.05 10*3/uL (ref 0.00–0.07)
Basophils Absolute: 0 10*3/uL (ref 0.0–0.1)
Basophils Relative: 0 %
Eosinophils Absolute: 0 10*3/uL (ref 0.0–0.5)
Eosinophils Relative: 0 %
HCT: 38.8 % (ref 36.0–46.0)
Hemoglobin: 13.4 g/dL (ref 12.0–15.0)
Immature Granulocytes: 1 %
Lymphocytes Relative: 30 %
Lymphs Abs: 2.1 10*3/uL (ref 0.7–4.0)
MCH: 30.3 pg (ref 26.0–34.0)
MCHC: 34.5 g/dL (ref 30.0–36.0)
MCV: 87.8 fL (ref 80.0–100.0)
Monocytes Absolute: 0.3 10*3/uL (ref 0.1–1.0)
Monocytes Relative: 5 %
Neutro Abs: 4.6 10*3/uL (ref 1.7–7.7)
Neutrophils Relative %: 64 %
Platelets: 305 10*3/uL (ref 150–400)
RBC: 4.42 MIL/uL (ref 3.87–5.11)
RDW: 15.2 % (ref 11.5–15.5)
WBC: 7.1 10*3/uL (ref 4.0–10.5)
nRBC: 0 % (ref 0.0–0.2)

## 2019-04-04 ENCOUNTER — Ambulatory Visit (INDEPENDENT_AMBULATORY_CARE_PROVIDER_SITE_OTHER): Payer: Medicare HMO | Admitting: Nurse Practitioner

## 2019-04-04 ENCOUNTER — Other Ambulatory Visit: Payer: Self-pay

## 2019-04-04 VITALS — BP 132/86 | HR 101 | Temp 97.9°F | Ht 62.0 in | Wt 285.0 lb

## 2019-04-04 DIAGNOSIS — F418 Other specified anxiety disorders: Secondary | ICD-10-CM

## 2019-04-04 DIAGNOSIS — F319 Bipolar disorder, unspecified: Secondary | ICD-10-CM | POA: Diagnosis not present

## 2019-04-04 NOTE — Progress Notes (Signed)
This visit occurred during the SARS-CoV-2 public health emergency.  Safety protocols were in place, including screening questions prior to the visit, additional usage of staff PPE, and extensive cleaning of exam room while observing appropriate contact time as indicated for disinfecting solutions.  Subjective:     Patient ID: Brenda Hernandez , female    DOB: 1975-11-29 , 44 y.o.   MRN: [id number]   Chief Complaint  Patient presents with  . Medication request from dentist    HPI  She has a history of anxiety and bipolar.  She has a dental appt on January 26th with Dr. Morrie Sheldon at [facility].  [phone].  Denies any chest pain or shortness of breath.  Her father is present with her today and requested an order for the patient to receive nitrous oxide at her dentist appt due to anxiety.     Past Medical History:  Diagnosis Date  . Abnormal pap   . Schizophrenia ([place]      Family History  Problem Relation Age of Onset  . Diabetes Maternal Grandmother   . Diabetes Maternal Grandfather   . Diabetes Paternal Grandmother   . Diabetes Paternal Grandfather   . Breast cancer Maternal Aunt   . Breast cancer Paternal Aunt      Current Outpatient Medications:  .  clomiPRAMINE (ANAFRANIL) 75 MG capsule, Take 75 mg by mouth 2 (two) times daily., Disp: , Rfl:  .  cloZAPine (CLOZARIL) 100 MG tablet, Take 500 mg by mouth daily., Disp: , Rfl:  .  levonorgestrel-ethinyl estradiol (AVIANE) 0.1-20 MG-MCG tablet, Take 1 tablet by mouth daily., Disp: 1 Package, Rfl: 12 .  levonorgestrel-ethinyl estradiol (SRONYX) 0.1-20 MG-MCG tablet, Sronyx 0.1 mg-20 mcg tablet  TAKE 1 TABLET BY MOUTH EVERY DAY, Disp: , Rfl:  .  metFORMIN (GLUCOPHAGE) 1000 MG tablet, Take 1,000 mg by mouth 2 (two) times daily with a meal., Disp: , Rfl:  .  pyridOXINE (VITAMIN B-6) 100 MG tablet, Take 100 mg by mouth daily. Reported on 07/02/2015, Disp: , Rfl:  .  valproate (DEPAKENE) 250 MG/5ML syrup, Take by mouth at bedtime. 50 ml  nightly, Disp: , Rfl:    Allergies  Allergen Reactions  . Benadryl [Diphenhydramine]   . Citric Acid      Review of Systems  Constitutional: Negative.   Respiratory: Negative.   Cardiovascular: Negative.   Neurological: Negative.   Psychiatric/Behavioral: The patient is nervous/anxious.        She needs assistance from her father today to request her needs     Today's Vitals   04/04/19 1155  BP: 132/86  Pulse: (!) 101  Temp: 97.9 F (36.6 C)  TempSrc: Oral  Weight: 285 lb (129.3 kg)  Height: 5\' 2"  (1.575 m)   Body mass index is 52.13 kg/m.   Objective:  Physical Exam Constitutional:      General: She is not in acute distress.    Appearance: Normal appearance. She is obese.  Skin:    Capillary Refill: Capillary refill takes less than 2 seconds.  Neurological:     General: No focal deficit present.     Mental Status: She is alert and oriented to person, place, and time.     Cranial Nerves: No cranial nerve deficit.  Psychiatric:        Mood and Affect: Mood normal.        Behavior: Behavior normal.        Judgment: Judgment normal.     Comments: She is having  difficulty with relaying what she needs for the dentist         Assessment And Plan:     1. Situational anxiety  Has increased anxiety with dentist appts  Provided a letter for the dentist that the patient is safe to receive Nitrous Oxide if necessary during dental treatments.   Her father also called to the dentist to find out what was needed   2. Bipolar affective disorder, remission status unspecified ([place])  Stable and being followed by behavioral health  Minette Brine, FNP    THE PATIENT IS ENCOURAGED TO PRACTICE SOCIAL DISTANCING DUE TO THE COVID-19 PANDEMIC.

## 2019-04-09 DIAGNOSIS — Z20828 Contact with and (suspected) exposure to other viral communicable diseases: Secondary | ICD-10-CM | POA: Diagnosis not present

## 2019-04-11 ENCOUNTER — Encounter: Payer: Self-pay | Admitting: Nurse Practitioner

## 2019-04-29 ENCOUNTER — Other Ambulatory Visit (HOSPITAL_COMMUNITY)
Admission: AD | Admit: 2019-04-29 | Discharge: 2019-04-29 | Disposition: A | Discharge status: Home / Self Care | Payer: Medicare HMO | Source: Ambulatory Visit | Attending: Psychiatry | Admitting: Psychiatry

## 2019-04-29 DIAGNOSIS — F259 Schizoaffective disorder, unspecified: Secondary | ICD-10-CM | POA: Insufficient documentation

## 2019-04-29 LAB — CBC WITH DIFFERENTIAL/PLATELET
Abs Immature Granulocytes: 0.03 10*3/uL (ref 0.00–0.07)
Basophils Absolute: 0 10*3/uL (ref 0.0–0.1)
Basophils Relative: 0 %
Eosinophils Absolute: 0 10*3/uL (ref 0.0–0.5)
Eosinophils Relative: 0 %
HCT: 37.2 % (ref 36.0–46.0)
Hemoglobin: 12.5 g/dL (ref 12.0–15.0)
Immature Granulocytes: 0 %
Lymphocytes Relative: 43 %
Lymphs Abs: 4 10*3/uL (ref 0.7–4.0)
MCH: 29.7 pg (ref 26.0–34.0)
MCHC: 33.6 g/dL (ref 30.0–36.0)
MCV: 88.4 fL (ref 80.0–100.0)
Monocytes Absolute: 0.7 10*3/uL (ref 0.1–1.0)
Monocytes Relative: 7 %
Neutro Abs: 4.6 10*3/uL (ref 1.7–7.7)
Neutrophils Relative %: 50 %
Platelets: 327 10*3/uL (ref 150–400)
RBC: 4.21 MIL/uL (ref 3.87–5.11)
RDW: 15.3 % (ref 11.5–15.5)
WBC: 9.3 10*3/uL (ref 4.0–10.5)
nRBC: 0 % (ref 0.0–0.2)

## 2019-06-03 ENCOUNTER — Other Ambulatory Visit (HOSPITAL_COMMUNITY)
Admission: AD | Admit: 2019-06-03 | Discharge: 2019-06-03 | Disposition: A | Discharge status: Home / Self Care | Payer: Medicare HMO | Source: Ambulatory Visit | Attending: Psychiatry | Admitting: Psychiatry

## 2019-06-03 DIAGNOSIS — F259 Schizoaffective disorder, unspecified: Secondary | ICD-10-CM | POA: Insufficient documentation

## 2019-06-03 LAB — CBC WITH DIFFERENTIAL/PLATELET
Abs Immature Granulocytes: 0.03 10*3/uL (ref 0.00–0.07)
Basophils Absolute: 0 10*3/uL (ref 0.0–0.1)
Basophils Relative: 0 %
Eosinophils Absolute: 0 10*3/uL (ref 0.0–0.5)
Eosinophils Relative: 0 %
HCT: 38.2 % (ref 36.0–46.0)
Hemoglobin: 12.8 g/dL (ref 12.0–15.0)
Immature Granulocytes: 0 %
Lymphocytes Relative: 37 %
Lymphs Abs: 2.5 10*3/uL (ref 0.7–4.0)
MCH: 29.3 pg (ref 26.0–34.0)
MCHC: 33.5 g/dL (ref 30.0–36.0)
MCV: 87.4 fL (ref 80.0–100.0)
Monocytes Absolute: 0.4 10*3/uL (ref 0.1–1.0)
Monocytes Relative: 6 %
Neutro Abs: 3.8 10*3/uL (ref 1.7–7.7)
Neutrophils Relative %: 57 %
Platelets: 321 10*3/uL (ref 150–400)
RBC: 4.37 MIL/uL (ref 3.87–5.11)
RDW: 15.1 % (ref 11.5–15.5)
WBC: 6.8 10*3/uL (ref 4.0–10.5)
nRBC: 0 % (ref 0.0–0.2)

## 2019-06-21 DIAGNOSIS — F209 Schizophrenia, unspecified: Secondary | ICD-10-CM | POA: Diagnosis not present

## 2019-06-21 DIAGNOSIS — Z6841 Body Mass Index (BMI) 40.0 and over, adult: Secondary | ICD-10-CM | POA: Diagnosis not present

## 2019-06-21 DIAGNOSIS — M545 Low back pain: Secondary | ICD-10-CM | POA: Diagnosis not present

## 2019-06-21 DIAGNOSIS — I739 Peripheral vascular disease, unspecified: Secondary | ICD-10-CM | POA: Diagnosis not present

## 2019-06-21 DIAGNOSIS — F319 Bipolar disorder, unspecified: Secondary | ICD-10-CM | POA: Diagnosis not present

## 2019-07-01 ENCOUNTER — Other Ambulatory Visit (HOSPITAL_COMMUNITY)
Admission: AD | Admit: 2019-07-01 | Discharge: 2019-07-01 | Disposition: A | Discharge status: Home / Self Care | Payer: Medicare HMO | Source: Ambulatory Visit | Attending: Psychiatry | Admitting: Psychiatry

## 2019-07-01 DIAGNOSIS — F259 Schizoaffective disorder, unspecified: Secondary | ICD-10-CM | POA: Diagnosis not present

## 2019-07-01 LAB — CBC
HCT: 38.1 % (ref 36.0–46.0)
Hemoglobin: 12.6 g/dL (ref 12.0–15.0)
MCH: 29 pg (ref 26.0–34.0)
MCHC: 33.1 g/dL (ref 30.0–36.0)
MCV: 87.8 fL (ref 80.0–100.0)
Platelets: 342 10*3/uL (ref 150–400)
RBC: 4.34 MIL/uL (ref 3.87–5.11)
RDW: 15.4 % (ref 11.5–15.5)
WBC: 7.3 10*3/uL (ref 4.0–10.5)
nRBC: 0 % (ref 0.0–0.2)

## 2019-07-03 DIAGNOSIS — F259 Schizoaffective disorder, unspecified: Secondary | ICD-10-CM | POA: Diagnosis not present

## 2019-07-03 DIAGNOSIS — F429 Obsessive-compulsive disorder, unspecified: Secondary | ICD-10-CM | POA: Diagnosis not present

## 2019-07-29 ENCOUNTER — Other Ambulatory Visit (HOSPITAL_COMMUNITY)
Admission: AD | Admit: 2019-07-29 | Discharge: 2019-07-29 | Disposition: A | Discharge status: Home / Self Care | Payer: Medicare HMO | Source: Ambulatory Visit | Attending: Psychiatry | Admitting: Psychiatry

## 2019-07-29 DIAGNOSIS — F259 Schizoaffective disorder, unspecified: Secondary | ICD-10-CM | POA: Diagnosis not present

## 2019-07-29 DIAGNOSIS — Z79899 Other long term (current) drug therapy: Secondary | ICD-10-CM | POA: Diagnosis not present

## 2019-07-29 LAB — COMPREHENSIVE METABOLIC PANEL
ALT: 18 U/L (ref 0–44)
AST: 20 U/L (ref 15–41)
Albumin: 3.5 g/dL (ref 3.5–5.0)
Alkaline Phosphatase: 65 U/L (ref 38–126)
Anion gap: 12 (ref 5–15)
BUN: 9 mg/dL (ref 6–20)
CO2: 24 mmol/L (ref 22–32)
Calcium: 8.7 mg/dL — ABNORMAL LOW (ref 8.9–10.3)
Chloride: 103 mmol/L (ref 98–111)
Creatinine, Ser: 0.89 mg/dL (ref 0.44–1.00)
GFR calc Af Amer: >60 mL/min (ref >60)
GFR calc non Af Amer: >60 mL/min (ref >60)
Glucose, Bld: 89 mg/dL (ref 70–99)
Potassium: 4.1 mmol/L (ref 3.5–5.1)
Sodium: 139 mmol/L (ref 135–145)
Total Bilirubin: 0.5 mg/dL (ref 0.3–1.2)
Total Protein: 7.2 g/dL (ref 6.5–8.1)

## 2019-07-29 LAB — CBC WITH DIFFERENTIAL/PLATELET
Abs Immature Granulocytes: 0.02 10*3/uL (ref 0.00–0.07)
Basophils Absolute: 0 10*3/uL (ref 0.0–0.1)
Basophils Relative: 0 %
Eosinophils Absolute: 0 10*3/uL (ref 0.0–0.5)
Eosinophils Relative: 0 %
HCT: 40.9 % (ref 36.0–46.0)
Hemoglobin: 13.6 g/dL (ref 12.0–15.0)
Immature Granulocytes: 0 %
Lymphocytes Relative: 41 %
Lymphs Abs: 2.3 10*3/uL (ref 0.7–4.0)
MCH: 29.1 pg (ref 26.0–34.0)
MCHC: 33.3 g/dL (ref 30.0–36.0)
MCV: 87.4 fL (ref 80.0–100.0)
Monocytes Absolute: 0.4 10*3/uL (ref 0.1–1.0)
Monocytes Relative: 7 %
Neutro Abs: 2.9 10*3/uL (ref 1.7–7.7)
Neutrophils Relative %: 52 %
Platelets: 300 10*3/uL (ref 150–400)
RBC: 4.68 MIL/uL (ref 3.87–5.11)
RDW: 15.8 % — ABNORMAL HIGH (ref 11.5–15.5)
WBC: 5.6 10*3/uL (ref 4.0–10.5)
nRBC: 0 % (ref 0.0–0.2)

## 2019-07-29 LAB — LIPID PANEL
Cholesterol: 206 mg/dL — ABNORMAL HIGH (ref 0–200)
HDL: 65 mg/dL (ref >40)
LDL Cholesterol: 127 mg/dL — ABNORMAL HIGH (ref 0–99)
Total CHOL/HDL Ratio: 3.2 RATIO
Triglycerides: 71 mg/dL (ref <150)
VLDL: 14 mg/dL (ref 0–40)

## 2019-07-29 LAB — HEMOGLOBIN A1C
Hgb A1c MFr Bld: 5.5 % (ref 4.8–5.6)
Mean Plasma Glucose: 111.15 mg/dL

## 2019-07-29 LAB — VITAMIN B12: Vitamin B-12: 187 pg/mL (ref 180–914)

## 2019-07-29 LAB — TSH: TSH: 0.741 u[IU]/mL (ref 0.350–4.500)

## 2019-07-29 LAB — FOLATE: Folate: 20.9 ng/mL (ref >5.9)

## 2019-07-29 LAB — VALPROIC ACID LEVEL: Valproic Acid Lvl: <10 ug/mL — ABNORMAL LOW (ref 50.0–100.0)

## 2019-07-29 LAB — VITAMIN D 25 HYDROXY (VIT D DEFICIENCY, FRACTURES): Vit D, 25-Hydroxy: 25.27 ng/mL — ABNORMAL LOW (ref 30–100)

## 2019-09-02 ENCOUNTER — Other Ambulatory Visit (HOSPITAL_COMMUNITY)
Admission: AD | Admit: 2019-09-02 | Discharge: 2019-09-02 | Disposition: A | Discharge status: Home / Self Care | Payer: Medicare HMO | Source: Ambulatory Visit | Attending: Psychiatry | Admitting: Psychiatry

## 2019-09-02 DIAGNOSIS — F259 Schizoaffective disorder, unspecified: Secondary | ICD-10-CM | POA: Diagnosis not present

## 2019-09-02 LAB — CBC
HCT: 36.7 % (ref 36.0–46.0)
Hemoglobin: 12.5 g/dL (ref 12.0–15.0)
MCH: 29.1 pg (ref 26.0–34.0)
MCHC: 34.1 g/dL (ref 30.0–36.0)
MCV: 85.3 fL (ref 80.0–100.0)
Platelets: 281 10*3/uL (ref 150–400)
RBC: 4.3 MIL/uL (ref 3.87–5.11)
RDW: 16 % — ABNORMAL HIGH (ref 11.5–15.5)
WBC: 7.3 10*3/uL (ref 4.0–10.5)
nRBC: 0 % (ref 0.0–0.2)

## 2019-09-30 ENCOUNTER — Other Ambulatory Visit (HOSPITAL_COMMUNITY)
Admission: AD | Admit: 2019-09-30 | Discharge: 2019-09-30 | Disposition: A | Discharge status: Home / Self Care | Payer: Medicare HMO | Source: Ambulatory Visit | Attending: Psychiatry | Admitting: Psychiatry

## 2019-09-30 DIAGNOSIS — F259 Schizoaffective disorder, unspecified: Secondary | ICD-10-CM | POA: Insufficient documentation

## 2019-09-30 LAB — CBC WITH DIFFERENTIAL/PLATELET
Abs Immature Granulocytes: 0.02 10*3/uL (ref 0.00–0.07)
Basophils Absolute: 0 10*3/uL (ref 0.0–0.1)
Basophils Relative: 0 %
Eosinophils Absolute: 0 10*3/uL (ref 0.0–0.5)
Eosinophils Relative: 0 %
HCT: 38 % (ref 36.0–46.0)
Hemoglobin: 12.6 g/dL (ref 12.0–15.0)
Immature Granulocytes: 0 %
Lymphocytes Relative: 36 %
Lymphs Abs: 2.2 10*3/uL (ref 0.7–4.0)
MCH: 28.4 pg (ref 26.0–34.0)
MCHC: 33.2 g/dL (ref 30.0–36.0)
MCV: 85.8 fL (ref 80.0–100.0)
Monocytes Absolute: 0.4 10*3/uL (ref 0.1–1.0)
Monocytes Relative: 7 %
Neutro Abs: 3.5 10*3/uL (ref 1.7–7.7)
Neutrophils Relative %: 57 %
Platelets: 290 10*3/uL (ref 150–400)
RBC: 4.43 MIL/uL (ref 3.87–5.11)
RDW: 16.5 % — ABNORMAL HIGH (ref 11.5–15.5)
WBC: 6.2 10*3/uL (ref 4.0–10.5)
nRBC: 0 % (ref 0.0–0.2)

## 2019-10-18 ENCOUNTER — Encounter: Payer: Medicare HMO | Admitting: Nurse Practitioner

## 2019-10-18 ENCOUNTER — Ambulatory Visit: Payer: Medicare HMO

## 2019-11-04 ENCOUNTER — Other Ambulatory Visit (HOSPITAL_COMMUNITY)
Admission: AD | Admit: 2019-11-04 | Discharge: 2019-11-04 | Disposition: A | Discharge status: Home / Self Care | Payer: Medicare HMO | Source: Ambulatory Visit | Attending: Psychiatry | Admitting: Psychiatry

## 2019-11-04 DIAGNOSIS — F259 Schizoaffective disorder, unspecified: Secondary | ICD-10-CM | POA: Diagnosis not present

## 2019-11-04 LAB — CBC WITH DIFFERENTIAL/PLATELET
Abs Immature Granulocytes: 0.04 10*3/uL (ref 0.00–0.07)
Basophils Absolute: 0 10*3/uL (ref 0.0–0.1)
Basophils Relative: 0 %
Eosinophils Absolute: 0 10*3/uL (ref 0.0–0.5)
Eosinophils Relative: 0 %
HCT: 36 % (ref 36.0–46.0)
Hemoglobin: 12.2 g/dL (ref 12.0–15.0)
Immature Granulocytes: 1 %
Lymphocytes Relative: 31 %
Lymphs Abs: 2.7 10*3/uL (ref 0.7–4.0)
MCH: 28.8 pg (ref 26.0–34.0)
MCHC: 33.9 g/dL (ref 30.0–36.0)
MCV: 85.1 fL (ref 80.0–100.0)
Monocytes Absolute: 0.5 10*3/uL (ref 0.1–1.0)
Monocytes Relative: 6 %
Neutro Abs: 5.5 10*3/uL (ref 1.7–7.7)
Neutrophils Relative %: 62 %
Platelets: 310 10*3/uL (ref 150–400)
RBC: 4.23 MIL/uL (ref 3.87–5.11)
RDW: 16.1 % — ABNORMAL HIGH (ref 11.5–15.5)
WBC: 8.8 10*3/uL (ref 4.0–10.5)
nRBC: 0 % (ref 0.0–0.2)

## 2019-12-02 ENCOUNTER — Other Ambulatory Visit (HOSPITAL_COMMUNITY)
Admission: RE | Admit: 2019-12-02 | Discharge: 2019-12-02 | Disposition: A | Discharge status: Home / Self Care | Payer: Medicare HMO | Attending: Psychiatry | Admitting: Psychiatry

## 2019-12-02 DIAGNOSIS — F259 Schizoaffective disorder, unspecified: Secondary | ICD-10-CM | POA: Diagnosis not present

## 2019-12-02 LAB — CBC WITH DIFFERENTIAL/PLATELET
Abs Immature Granulocytes: 0.03 10*3/uL (ref 0.00–0.07)
Basophils Absolute: 0 10*3/uL (ref 0.0–0.1)
Basophils Relative: 0 %
Eosinophils Absolute: 0 10*3/uL (ref 0.0–0.5)
Eosinophils Relative: 0 %
HCT: 40.6 % (ref 36.0–46.0)
Hemoglobin: 13.5 g/dL (ref 12.0–15.0)
Immature Granulocytes: 0 %
Lymphocytes Relative: 36 %
Lymphs Abs: 3.1 10*3/uL (ref 0.7–4.0)
MCH: 29.2 pg (ref 26.0–34.0)
MCHC: 33.3 g/dL (ref 30.0–36.0)
MCV: 87.7 fL (ref 80.0–100.0)
Monocytes Absolute: 0.6 10*3/uL (ref 0.1–1.0)
Monocytes Relative: 6 %
Neutro Abs: 4.9 10*3/uL (ref 1.7–7.7)
Neutrophils Relative %: 58 %
Platelets: 304 10*3/uL (ref 150–400)
RBC: 4.63 MIL/uL (ref 3.87–5.11)
RDW: 16.7 % — ABNORMAL HIGH (ref 11.5–15.5)
WBC: 8.6 10*3/uL (ref 4.0–10.5)
nRBC: 0 % (ref 0.0–0.2)

## 2019-12-29 DIAGNOSIS — L720 Epidermal cyst: Secondary | ICD-10-CM | POA: Diagnosis not present

## 2019-12-30 ENCOUNTER — Other Ambulatory Visit (HOSPITAL_COMMUNITY)
Admission: RE | Admit: 2019-12-30 | Discharge: 2019-12-30 | Disposition: A | Discharge status: Home / Self Care | Payer: Medicare HMO | Source: Ambulatory Visit | Attending: Psychiatry | Admitting: Psychiatry

## 2019-12-30 DIAGNOSIS — F259 Schizoaffective disorder, unspecified: Secondary | ICD-10-CM | POA: Insufficient documentation

## 2019-12-30 DIAGNOSIS — Z79899 Other long term (current) drug therapy: Secondary | ICD-10-CM | POA: Insufficient documentation

## 2019-12-30 LAB — CBC WITH DIFFERENTIAL/PLATELET
Abs Immature Granulocytes: 0.03 10*3/uL (ref 0.00–0.07)
Basophils Absolute: 0 10*3/uL (ref 0.0–0.1)
Basophils Relative: 0 %
Eosinophils Absolute: 0 10*3/uL (ref 0.0–0.5)
Eosinophils Relative: 0 %
HCT: 34.7 % — ABNORMAL LOW (ref 36.0–46.0)
Hemoglobin: 11.4 g/dL — ABNORMAL LOW (ref 12.0–15.0)
Immature Granulocytes: 1 %
Lymphocytes Relative: 34 %
Lymphs Abs: 2.1 10*3/uL (ref 0.7–4.0)
MCH: 28.5 pg (ref 26.0–34.0)
MCHC: 32.9 g/dL (ref 30.0–36.0)
MCV: 86.8 fL (ref 80.0–100.0)
Monocytes Absolute: 0.4 10*3/uL (ref 0.1–1.0)
Monocytes Relative: 7 %
Neutro Abs: 3.7 10*3/uL (ref 1.7–7.7)
Neutrophils Relative %: 58 %
Platelets: 295 10*3/uL (ref 150–400)
RBC: 4 MIL/uL (ref 3.87–5.11)
RDW: 16.6 % — ABNORMAL HIGH (ref 11.5–15.5)
WBC: 6.3 10*3/uL (ref 4.0–10.5)
nRBC: 0 % (ref 0.0–0.2)

## 2020-01-01 ENCOUNTER — Encounter: Payer: Self-pay | Admitting: Nurse Practitioner

## 2020-01-01 ENCOUNTER — Other Ambulatory Visit: Payer: Self-pay

## 2020-01-01 ENCOUNTER — Encounter: Payer: Medicare HMO | Admitting: Nurse Practitioner

## 2020-01-01 DIAGNOSIS — G2401 Drug induced subacute dyskinesia: Secondary | ICD-10-CM | POA: Diagnosis not present

## 2020-01-01 DIAGNOSIS — F429 Obsessive-compulsive disorder, unspecified: Secondary | ICD-10-CM | POA: Diagnosis not present

## 2020-01-01 DIAGNOSIS — F259 Schizoaffective disorder, unspecified: Secondary | ICD-10-CM | POA: Diagnosis not present

## 2020-01-01 DIAGNOSIS — Z79899 Other long term (current) drug therapy: Secondary | ICD-10-CM | POA: Diagnosis not present

## 2020-01-05 NOTE — Progress Notes (Signed)
Not seen

## 2020-01-19 DIAGNOSIS — Z3040 Encounter for surveillance of contraceptives, unspecified: Secondary | ICD-10-CM | POA: Diagnosis not present

## 2020-01-19 DIAGNOSIS — R87612 Low grade squamous intraepithelial lesion on cytologic smear of cervix (LGSIL): Secondary | ICD-10-CM | POA: Diagnosis not present

## 2020-01-19 DIAGNOSIS — R87810 Cervical high risk human papillomavirus (HPV) DNA test positive: Secondary | ICD-10-CM | POA: Diagnosis not present

## 2020-01-19 DIAGNOSIS — Z6841 Body Mass Index (BMI) 40.0 and over, adult: Secondary | ICD-10-CM | POA: Diagnosis not present

## 2020-01-19 DIAGNOSIS — Z124 Encounter for screening for malignant neoplasm of cervix: Secondary | ICD-10-CM | POA: Diagnosis not present

## 2020-01-19 DIAGNOSIS — Z01411 Encounter for gynecological examination (general) (routine) with abnormal findings: Secondary | ICD-10-CM | POA: Diagnosis not present

## 2020-01-19 DIAGNOSIS — N870 Mild cervical dysplasia: Secondary | ICD-10-CM | POA: Diagnosis not present

## 2020-01-19 DIAGNOSIS — N6002 Solitary cyst of left breast: Secondary | ICD-10-CM | POA: Insufficient documentation

## 2020-01-19 DIAGNOSIS — Z1231 Encounter for screening mammogram for malignant neoplasm of breast: Secondary | ICD-10-CM | POA: Diagnosis not present

## 2020-01-22 ENCOUNTER — Encounter: Payer: Medicare HMO | Admitting: Nurse Practitioner

## 2020-02-03 ENCOUNTER — Other Ambulatory Visit (HOSPITAL_COMMUNITY)
Admission: RE | Admit: 2020-02-03 | Discharge: 2020-02-03 | Disposition: A | Discharge status: Home / Self Care | Payer: Medicare HMO | Attending: Psychiatry | Admitting: Psychiatry

## 2020-02-03 DIAGNOSIS — Z79899 Other long term (current) drug therapy: Secondary | ICD-10-CM | POA: Diagnosis not present

## 2020-02-03 DIAGNOSIS — F259 Schizoaffective disorder, unspecified: Secondary | ICD-10-CM | POA: Insufficient documentation

## 2020-02-03 DIAGNOSIS — Z5181 Encounter for therapeutic drug level monitoring: Secondary | ICD-10-CM | POA: Diagnosis not present

## 2020-02-03 LAB — CBC WITH DIFFERENTIAL/PLATELET
Abs Immature Granulocytes: 0.01 10*3/uL (ref 0.00–0.07)
Basophils Absolute: 0 10*3/uL (ref 0.0–0.1)
Basophils Relative: 0 %
Eosinophils Absolute: 0 10*3/uL (ref 0.0–0.5)
Eosinophils Relative: 0 %
HCT: 38.1 % (ref 36.0–46.0)
Hemoglobin: 12.8 g/dL (ref 12.0–15.0)
Immature Granulocytes: 0 %
Lymphocytes Relative: 38 %
Lymphs Abs: 3 10*3/uL (ref 0.7–4.0)
MCH: 29 pg (ref 26.0–34.0)
MCHC: 33.6 g/dL (ref 30.0–36.0)
MCV: 86.2 fL (ref 80.0–100.0)
Monocytes Absolute: 0.4 10*3/uL (ref 0.1–1.0)
Monocytes Relative: 5 %
Neutro Abs: 4.4 10*3/uL (ref 1.7–7.7)
Neutrophils Relative %: 57 %
Platelets: 277 10*3/uL (ref 150–400)
RBC: 4.42 MIL/uL (ref 3.87–5.11)
RDW: 16.6 % — ABNORMAL HIGH (ref 11.5–15.5)
WBC: 7.8 10*3/uL (ref 4.0–10.5)
nRBC: 0 % (ref 0.0–0.2)

## 2020-02-03 LAB — LIPID PANEL
Cholesterol: 178 mg/dL (ref 0–200)
HDL: 75 mg/dL (ref >40)
LDL Cholesterol: 85 mg/dL (ref 0–99)
Total CHOL/HDL Ratio: 2.4 RATIO
Triglycerides: 88 mg/dL (ref <150)
VLDL: 18 mg/dL (ref 0–40)

## 2020-02-03 LAB — COMPREHENSIVE METABOLIC PANEL
ALT: 12 U/L (ref 0–44)
AST: 16 U/L (ref 15–41)
Albumin: 3 g/dL — ABNORMAL LOW (ref 3.5–5.0)
Alkaline Phosphatase: 56 U/L (ref 38–126)
Anion gap: 11 (ref 5–15)
BUN: 8 mg/dL (ref 6–20)
CO2: 25 mmol/L (ref 22–32)
Calcium: 8.7 mg/dL — ABNORMAL LOW (ref 8.9–10.3)
Chloride: 103 mmol/L (ref 98–111)
Creatinine, Ser: 0.9 mg/dL (ref 0.44–1.00)
GFR, Estimated: >60 mL/min (ref >60)
Glucose, Bld: 101 mg/dL — ABNORMAL HIGH (ref 70–99)
Potassium: 4.1 mmol/L (ref 3.5–5.1)
Sodium: 139 mmol/L (ref 135–145)
Total Bilirubin: 0.5 mg/dL (ref 0.3–1.2)
Total Protein: 6.7 g/dL (ref 6.5–8.1)

## 2020-02-03 LAB — HEMOGLOBIN A1C
Hgb A1c MFr Bld: 5.4 % (ref 4.8–5.6)
Mean Plasma Glucose: 108.28 mg/dL

## 2020-02-06 LAB — CLOZAPINE (CLOZARIL)
Clozapine Lvl: 936 ng/mL — ABNORMAL HIGH (ref 350–650)
NorClozapine: 227 ng/mL
Total(Cloz+Norcloz): 1163 ng/mL

## 2020-02-13 ENCOUNTER — Ambulatory Visit: Payer: Medicare HMO | Admitting: Physician Assistant

## 2020-02-19 ENCOUNTER — Ambulatory Visit (INDEPENDENT_AMBULATORY_CARE_PROVIDER_SITE_OTHER): Payer: Medicare HMO | Admitting: Internal Medicine

## 2020-02-19 ENCOUNTER — Other Ambulatory Visit: Payer: Self-pay

## 2020-02-19 ENCOUNTER — Encounter: Payer: Self-pay | Admitting: Internal Medicine

## 2020-02-19 DIAGNOSIS — E119 Type 2 diabetes mellitus without complications: Secondary | ICD-10-CM | POA: Insufficient documentation

## 2020-02-19 DIAGNOSIS — Z23 Encounter for immunization: Secondary | ICD-10-CM | POA: Diagnosis not present

## 2020-02-19 DIAGNOSIS — M25561 Pain in right knee: Secondary | ICD-10-CM | POA: Diagnosis not present

## 2020-02-19 DIAGNOSIS — E559 Vitamin D deficiency, unspecified: Secondary | ICD-10-CM | POA: Diagnosis not present

## 2020-02-19 DIAGNOSIS — E1165 Type 2 diabetes mellitus with hyperglycemia: Secondary | ICD-10-CM

## 2020-02-19 DIAGNOSIS — F259 Schizoaffective disorder, unspecified: Secondary | ICD-10-CM | POA: Insufficient documentation

## 2020-02-19 DIAGNOSIS — G8929 Other chronic pain: Secondary | ICD-10-CM | POA: Insufficient documentation

## 2020-02-19 DIAGNOSIS — E538 Deficiency of other specified B group vitamins: Secondary | ICD-10-CM | POA: Diagnosis not present

## 2020-02-19 DIAGNOSIS — F3170 Bipolar disorder, currently in remission, most recent episode unspecified: Secondary | ICD-10-CM

## 2020-02-19 DIAGNOSIS — M545 Low back pain, unspecified: Secondary | ICD-10-CM | POA: Diagnosis not present

## 2020-02-19 DIAGNOSIS — F250 Schizoaffective disorder, bipolar type: Secondary | ICD-10-CM | POA: Diagnosis not present

## 2020-02-19 DIAGNOSIS — R7303 Prediabetes: Secondary | ICD-10-CM | POA: Insufficient documentation

## 2020-02-19 MED ORDER — B COMPLEX PLUS PO TABS: 1.0000 | ORAL_TABLET | Freq: Every day | ORAL | 5 refills | Status: DC

## 2020-02-19 MED ORDER — CYANOCOBALAMIN 1000 MCG/ML IJ SOLN
1000.0000 ug | Freq: Once | INTRAMUSCULAR | Status: AC
  Administered 2020-02-19: 1000 ug via INTRAMUSCULAR

## 2020-02-19 MED ORDER — IBUPROFEN 400 MG PO TABS: 400.0000 mg | ORAL_TABLET | Freq: Three times a day (TID) | ORAL | 2 refills | Status: DC | PRN

## 2020-02-19 MED ORDER — VITAMIN D 25 MCG (1000 UNIT) PO TABS: 2000.0000 [IU] | ORAL_TABLET | Freq: Every day | ORAL | 5 refills | Status: DC

## 2020-02-19 NOTE — Assessment & Plan Note (Signed)
Tylenol prn 

## 2020-02-19 NOTE — Progress Notes (Signed)
Subjective:  Patient ID: Brenda Hernandez, female    DOB: Jul 26, 1975  Age: 44 y.o. MRN: [id number]  CC: New Patient (Initial Visit)   HPI Brenda Hernandez presents for a new pt visit We need to address problem with her schizoaffective disorder, OCD, anxiety.  She is seeing her psychiatrist Dr. Barbie Banner.  She has been on disability.  She has gained a little weight on meds.  She is complaining of the right knee pain off and on, occasional low back pain  Outpatient Medications Prior to Visit  Medication Sig Dispense Refill  . clomiPRAMINE (ANAFRANIL) 75 MG capsule Take 75 mg by mouth 2 (two) times daily.    . cloZAPine (CLOZARIL) 100 MG tablet Take 500 mg by mouth daily.    Marland Kitchen levonorgestrel-ethinyl estradiol (SRONYX) 0.1-20 MG-MCG tablet Sronyx 0.1 mg-20 mcg tablet  TAKE 1 TABLET BY MOUTH EVERY DAY    . metFORMIN (GLUCOPHAGE) 1000 MG tablet Take 1,000 mg by mouth 2 (two) times daily with a meal.    . valproate (DEPAKENE) 250 MG/5ML syrup Take by mouth at bedtime. 50 ml nightly    . levonorgestrel-ethinyl estradiol (AVIANE) 0.1-20 MG-MCG tablet Take 1 tablet by mouth daily. (Patient not taking: Reported on 02/19/2020) 1 Package 12  . pyridOXINE (VITAMIN B-6) 100 MG tablet Take 100 mg by mouth daily. Reported on 07/02/2015 (Patient not taking: Reported on 02/19/2020)     No facility-administered medications prior to visit.    ROS: Review of Systems  Constitutional: Positive for unexpected weight change. Negative for activity change, appetite change, chills and fatigue.  HENT: Negative for congestion, mouth sores and sinus pressure.   Eyes: Negative for visual disturbance.  Respiratory: Negative for cough and chest tightness.   Gastrointestinal: Negative for abdominal pain and nausea.  Genitourinary: Negative for difficulty urinating, frequency and vaginal pain.  Musculoskeletal: Negative for back pain and gait problem.  Skin: Negative for pallor and rash.  Neurological: Negative for dizziness,  tremors, weakness, numbness and headaches.  Psychiatric/Behavioral: Positive for behavioral problems. Negative for agitation, confusion, sleep disturbance and suicidal ideas. The patient is nervous/anxious.     Objective:  BP 122/70 (BP Location: Left Arm)   Pulse 98   Temp 98.3 F (36.8 C) (Oral)   Ht 5\' 2"  (1.575 m)   Wt 285 lb 12.8 oz (129.6 kg)   SpO2 99%   BMI 52.27 kg/m   BP Readings from Last 3 Encounters:  02/19/20 122/70  04/04/19 132/86  10/12/18 138/80    Wt Readings from Last 3 Encounters:  02/19/20 285 lb 12.8 oz (129.6 kg)  04/04/19 285 lb (129.3 kg)  10/12/18 279 lb (126.6 kg)    Physical Exam Constitutional:      General: She is not in acute distress.    Appearance: She is well-developed. She is obese.  HENT:     Head: Normocephalic.     Right Ear: External ear normal.     Left Ear: External ear normal.     Nose: Nose normal.  Eyes:     General:        Right eye: No discharge.        Left eye: No discharge.     Conjunctiva/sclera: Conjunctivae normal.     Pupils: Pupils are equal, round, and reactive to light.  Neck:     Thyroid: No thyromegaly.     Vascular: No JVD.     Trachea: No tracheal deviation.  Cardiovascular:     Rate and Rhythm: Normal rate  and regular rhythm.     Heart sounds: Normal heart sounds.  Pulmonary:     Effort: No respiratory distress.     Breath sounds: No stridor. No wheezing.  Abdominal:     General: Bowel sounds are normal. There is no distension.     Palpations: Abdomen is soft. There is no mass.     Tenderness: There is no abdominal tenderness. There is no guarding or rebound.  Musculoskeletal:        General: No tenderness.     Cervical back: Normal range of motion and neck supple.  Lymphadenopathy:     Cervical: No cervical adenopathy.  Skin:    Findings: No erythema or rash.  Neurological:     Mental Status: She is oriented to person, place, and time.     Cranial Nerves: No cranial nerve deficit.      Motor: No abnormal muscle tone.     Coordination: Coordination normal.     Deep Tendon Reflexes: Reflexes normal.  Psychiatric:        Behavior: Behavior normal.        Thought Content: Thought content normal.        Judgment: Judgment normal.     A total time of >60 minutes was spent preparing to see the patient, reviewing tests, x-rays, operative reports and outside records.  Also, obtaining history and performing comprehensive physical exam.  Additionally, counseling the patient regarding the above listed issues.   Finally, documenting clinical information in the health records, coordination of care, educating the new patient. It is a complex case.   Lab Results  Component Value Date   WBC 7.8 02/03/2020   HGB 12.8 02/03/2020   HCT 38.1 02/03/2020   PLT 277 02/03/2020   GLUCOSE 101 (H) 02/03/2020   CHOL 178 02/03/2020   TRIG 88 02/03/2020   HDL 75 02/03/2020   LDLCALC 85 02/03/2020   ALT 12 02/03/2020   AST 16 02/03/2020   NA 139 02/03/2020   K 4.1 02/03/2020   CL 103 02/03/2020   CREATININE 0.90 02/03/2020   BUN 8 02/03/2020   CO2 25 02/03/2020   TSH 0.741 07/29/2019   HGBA1C 5.4 02/03/2020    No results found.  Assessment & Plan:    Walker Kehr, MD

## 2020-02-19 NOTE — Assessment & Plan Note (Addendum)
Chronic Clozaril Anafranil Depakane Psychol ref She is seeing her psychiatrist Dr. Barbie Banner.  She has been on disability.

## 2020-02-19 NOTE — Patient Instructions (Signed)
Ergonomic work Astronomer, walk

## 2020-02-19 NOTE — Assessment & Plan Note (Addendum)
Vit B12 187 Start B12

## 2020-02-19 NOTE — Assessment & Plan Note (Signed)
Due to meds - diet controlled

## 2020-02-20 NOTE — Assessment & Plan Note (Signed)
She is seeing her psychiatrist Dr. Barbie Banner.  She has been on disability.

## 2020-03-01 DIAGNOSIS — R87810 Cervical high risk human papillomavirus (HPV) DNA test positive: Secondary | ICD-10-CM | POA: Diagnosis not present

## 2020-03-01 DIAGNOSIS — N870 Mild cervical dysplasia: Secondary | ICD-10-CM | POA: Diagnosis not present

## 2020-03-01 DIAGNOSIS — R87612 Low grade squamous intraepithelial lesion on cytologic smear of cervix (LGSIL): Secondary | ICD-10-CM | POA: Diagnosis not present

## 2020-03-01 LAB — HM PAP SMEAR: HM Pap smear: ABNORMAL

## 2020-03-02 ENCOUNTER — Other Ambulatory Visit (HOSPITAL_COMMUNITY)
Admission: AD | Admit: 2020-03-02 | Discharge: 2020-03-02 | Disposition: A | Discharge status: Home / Self Care | Payer: Medicare HMO | Attending: Psychiatry | Admitting: Psychiatry

## 2020-03-02 DIAGNOSIS — F259 Schizoaffective disorder, unspecified: Secondary | ICD-10-CM | POA: Diagnosis not present

## 2020-03-02 DIAGNOSIS — Z79899 Other long term (current) drug therapy: Secondary | ICD-10-CM | POA: Diagnosis not present

## 2020-03-02 LAB — CBC WITH DIFFERENTIAL/PLATELET
Abs Immature Granulocytes: 0.02 10*3/uL (ref 0.00–0.07)
Basophils Absolute: 0 10*3/uL (ref 0.0–0.1)
Basophils Relative: 0 %
Eosinophils Absolute: 0 10*3/uL (ref 0.0–0.5)
Eosinophils Relative: 0 %
HCT: 33.7 % — ABNORMAL LOW (ref 36.0–46.0)
Hemoglobin: 11.1 g/dL — ABNORMAL LOW (ref 12.0–15.0)
Immature Granulocytes: 0 %
Lymphocytes Relative: 24 %
Lymphs Abs: 1.6 10*3/uL (ref 0.7–4.0)
MCH: 28.7 pg (ref 26.0–34.0)
MCHC: 32.9 g/dL (ref 30.0–36.0)
MCV: 87.1 fL (ref 80.0–100.0)
Monocytes Absolute: 0.7 10*3/uL (ref 0.1–1.0)
Monocytes Relative: 10 %
Neutro Abs: 4.6 10*3/uL (ref 1.7–7.7)
Neutrophils Relative %: 66 %
Platelets: 271 10*3/uL (ref 150–400)
RBC: 3.87 MIL/uL (ref 3.87–5.11)
RDW: 16.8 % — ABNORMAL HIGH (ref 11.5–15.5)
WBC: 7 10*3/uL (ref 4.0–10.5)
nRBC: 0 % (ref 0.0–0.2)

## 2020-03-04 ENCOUNTER — Encounter: Payer: Self-pay | Admitting: Nurse Practitioner

## 2020-03-11 DIAGNOSIS — Z23 Encounter for immunization: Secondary | ICD-10-CM | POA: Diagnosis not present

## 2020-03-26 ENCOUNTER — Ambulatory Visit (INDEPENDENT_AMBULATORY_CARE_PROVIDER_SITE_OTHER): Payer: Medicare HMO | Admitting: Family Medicine

## 2020-03-26 ENCOUNTER — Other Ambulatory Visit: Payer: Self-pay

## 2020-03-26 ENCOUNTER — Encounter (INDEPENDENT_AMBULATORY_CARE_PROVIDER_SITE_OTHER): Payer: Self-pay | Admitting: Family Medicine

## 2020-03-26 VITALS — BP 137/85 | HR 97 | Temp 97.9°F | Ht 63.0 in | Wt 287.0 lb

## 2020-03-26 DIAGNOSIS — R5383 Other fatigue: Secondary | ICD-10-CM

## 2020-03-26 DIAGNOSIS — Z1331 Encounter for screening for depression: Secondary | ICD-10-CM | POA: Diagnosis not present

## 2020-03-26 DIAGNOSIS — R0602 Shortness of breath: Secondary | ICD-10-CM | POA: Diagnosis not present

## 2020-03-26 DIAGNOSIS — Z6841 Body Mass Index (BMI) 40.0 and over, adult: Secondary | ICD-10-CM | POA: Diagnosis not present

## 2020-03-26 DIAGNOSIS — R7303 Prediabetes: Secondary | ICD-10-CM | POA: Diagnosis not present

## 2020-03-26 DIAGNOSIS — E559 Vitamin D deficiency, unspecified: Secondary | ICD-10-CM | POA: Diagnosis not present

## 2020-03-26 DIAGNOSIS — E538 Deficiency of other specified B group vitamins: Secondary | ICD-10-CM | POA: Diagnosis not present

## 2020-03-26 DIAGNOSIS — Z0289 Encounter for other administrative examinations: Secondary | ICD-10-CM

## 2020-03-27 LAB — COMPREHENSIVE METABOLIC PANEL
ALT: 12 IU/L (ref 0–32)
AST: 14 IU/L (ref 0–40)
Albumin/Globulin Ratio: 1.3 (ref 1.2–2.2)
Albumin: 4.1 g/dL (ref 3.8–4.8)
Alkaline Phosphatase: 72 IU/L (ref 44–121)
BUN/Creatinine Ratio: 13 (ref 9–23)
BUN: 11 mg/dL (ref 6–24)
Bilirubin Total: <0.2 mg/dL (ref 0.0–1.2)
CO2: 20 mmol/L (ref 20–29)
Calcium: 8.9 mg/dL (ref 8.7–10.2)
Chloride: 99 mmol/L (ref 96–106)
Creatinine, Ser: 0.85 mg/dL (ref 0.57–1.00)
GFR calc Af Amer: 98 mL/min/{1.73_m2} (ref >59)
GFR calc non Af Amer: 85 mL/min/{1.73_m2} (ref >59)
Globulin, Total: 3.1 g/dL (ref 1.5–4.5)
Glucose: 95 mg/dL (ref 65–99)
Potassium: 4.2 mmol/L (ref 3.5–5.2)
Sodium: 138 mmol/L (ref 134–144)
Total Protein: 7.2 g/dL (ref 6.0–8.5)

## 2020-03-27 LAB — CBC WITH DIFFERENTIAL/PLATELET
Basophils Absolute: 0 10*3/uL (ref 0.0–0.2)
Basos: 0 %
EOS (ABSOLUTE): 0 10*3/uL (ref 0.0–0.4)
Eos: 0 %
Hematocrit: 38.2 % (ref 34.0–46.6)
Hemoglobin: 12.6 g/dL (ref 11.1–15.9)
Immature Grans (Abs): 0 10*3/uL (ref 0.0–0.1)
Immature Granulocytes: 0 %
Lymphocytes Absolute: 3.1 10*3/uL (ref 0.7–3.1)
Lymphs: 41 %
MCH: 29.1 pg (ref 26.6–33.0)
MCHC: 33 g/dL (ref 31.5–35.7)
MCV: 88 fL (ref 79–97)
Monocytes Absolute: 0.5 10*3/uL (ref 0.1–0.9)
Monocytes: 7 %
Neutrophils Absolute: 3.9 10*3/uL (ref 1.4–7.0)
Neutrophils: 52 %
Platelets: 298 10*3/uL (ref 150–450)
RBC: 4.33 x10E6/uL (ref 3.77–5.28)
RDW: 15.8 % — ABNORMAL HIGH (ref 11.7–15.4)
WBC: 7.6 10*3/uL (ref 3.4–10.8)

## 2020-03-27 LAB — LIPID PANEL WITH LDL/HDL RATIO
Cholesterol, Total: 183 mg/dL (ref 100–199)
HDL: 74 mg/dL (ref >39)
LDL Chol Calc (NIH): 92 mg/dL (ref 0–99)
LDL/HDL Ratio: 1.2 ratio (ref 0.0–3.2)
Triglycerides: 95 mg/dL (ref 0–149)
VLDL Cholesterol Cal: 17 mg/dL (ref 5–40)

## 2020-03-27 LAB — VITAMIN D 25 HYDROXY (VIT D DEFICIENCY, FRACTURES): Vit D, 25-Hydroxy: 33 ng/mL (ref 30.0–100.0)

## 2020-03-27 LAB — HEMOGLOBIN A1C
Est. average glucose Bld gHb Est-mCnc: 120 mg/dL
Hgb A1c MFr Bld: 5.8 % — ABNORMAL HIGH (ref 4.8–5.6)

## 2020-03-27 LAB — T4, FREE: Free T4: 1.15 ng/dL (ref 0.82–1.77)

## 2020-03-27 LAB — TSH: TSH: 1.98 u[IU]/mL (ref 0.450–4.500)

## 2020-03-27 LAB — INSULIN, RANDOM: INSULIN: 24.7 u[IU]/mL (ref 2.6–24.9)

## 2020-03-27 LAB — VITAMIN B12: Vitamin B-12: 330 pg/mL (ref 232–1245)

## 2020-03-27 LAB — T3: T3, Total: 144 ng/dL (ref 71–180)

## 2020-03-28 DIAGNOSIS — Z20822 Contact with and (suspected) exposure to covid-19: Secondary | ICD-10-CM | POA: Diagnosis not present

## 2020-03-28 NOTE — Progress Notes (Signed)
Chief Complaint:   OBESITY Brenda Hernandez (MR# [id number]) is a 45 y.o. female who presents for evaluation and treatment of obesity and related comorbidities. Current BMI is Body mass index is 47.3 kg/m. Brenda Hernandez has been struggling with her weight for many years and has been unsuccessful in either losing weight, maintaining weight loss, or reaching her healthy weight goal.  Brenda Hernandez is currently in the action stage of change and ready to dedicate time achieving and maintaining a healthier weight. Brenda Hernandez is interested in becoming our patient and working on intensive lifestyle modifications including (but not limited to) diet and exercise for weight loss.  Brenda Hernandez's habits were reviewed today and are as follows: Her family eats meals together, she thinks her family will eat healthier with her, her desired weight loss is 137 lbs, she started gaining weight after mental state diagnosis, her heaviest weight ever was 303 pounds, she is a picky eater and doesn't like to eat healthier foods, she has significant food cravings issues, she snacks frequently in the evenings, she wakes up frequently in the middle of the night to eat, she skips meals frequently, she frequently makes poor food choices, she has problems with excessive hunger, she frequently eats larger portions than normal and she struggles with emotional eating.  Depression Screen Brenda Hernandez's Food and Mood (modified PHQ-9) score was 10.  Depression screen [facility] 2/9 03/26/2020  Decreased Interest 3  Down, Depressed, Hopeless 1  PHQ - 2 Score 4  Altered sleeping 3  Tired, decreased energy 1  Change in appetite 1  Feeling bad or failure about yourself  0  Trouble concentrating 0  Moving slowly or fidgety/restless 0  Suicidal thoughts 1  PHQ-9 Score 10   Subjective:   1. Other fatigue Brenda Hernandez admits to daytime somnolence and denies waking up still tired. Patent has a history of symptoms of daytime fatigue. Brenda Hernandez generally gets 7 or 9  hours of sleep per night, and states that she has generally restful sleep. Snoring is not present. Apneic episodes are not present. Epworth Sleepiness Score is 7.  2. SOB (shortness of breath) on exertion Brenda Hernandez notes increasing shortness of breath with exercising and seems to be worsening over time with weight gain. She notes getting out of breath sooner with activity than she used to. This has not gotten worse recently. Brenda Hernandez denies shortness of breath at rest or orthopnea.  3. Pre-diabetes Brenda Hernandez is on metformin, and she is working on diet. Her highest A1c in Epic were all below 6.5.  4. Vitamin D deficiency Brenda Hernandez is on OTC Vit D. She has no recent labs, and she notes fatigue.  5. B12 deficiency Brenda Hernandez is on multivitamins and metformin. She has no recent B12 labs.  Assessment/Plan:   1. Other fatigue Brenda Hernandez does feel that her weight is causing her energy to be lower than it should be. Fatigue may be related to obesity, depression or many other causes. Labs will be ordered, and in the meanwhile, Brenda Hernandez will focus on self care including making healthy food choices, increasing physical activity and focusing on stress reduction.  - EKG 12-Lead - CBC with Differential/Platelet - Lipid Panel With LDL/HDL Ratio - T3 - T4, free - TSH  2. SOB (shortness of breath) on exertion Brenda Hernandez does feel that she gets out of breath more easily that she used to when she exercises. Brenda Hernandez's shortness of breath appears to be obesity related and exercise induced. She has agreed to work on weight loss and gradually  increase exercise to treat her exercise induced shortness of breath. Will continue to monitor closely.  - Lipid Panel With LDL/HDL Ratio  3. Pre-diabetes Brenda Hernandez will start her eating plan, and will continue to work on weight loss, exercise, and decreasing simple carbohydrates to help decrease the risk of diabetes. We will check labs today.  - Comprehensive metabolic panel -  Hemoglobin A1c - Insulin, random  4. Vitamin D deficiency Low Vitamin D level contributes to fatigue and are associated with obesity, breast, and colon cancer. We will check labs today. Brenda Hernandez will follow-up for routine testing of Vitamin D, at least 2-3 times per year to avoid over-replacement.  - VITAMIN D 25 Hydroxy (Vit-D Deficiency, Fractures)  5. B12 deficiency The diagnosis was reviewed with the patient. We will continue to monitor. We will check labs today. Orders and follow up as documented in patient record.  - Vitamin B12  6. Depression screening Brenda Hernandez had a positive depression screening. Depression is commonly associated with obesity and often results in emotional eating behaviors. We will monitor this closely and work on CBT to help improve the non-hunger eating patterns. Referral to Psychology may be required if no improvement is seen as she continues in our clinic.  7. Class 3 severe obesity with serious comorbidity and body mass index (BMI) of 50.0 to 59.9 in adult, unspecified obesity type ([place]) Brenda Hernandez is currently in the action stage of change and her goal is to continue with weight loss efforts. I recommend Brenda Hernandez begin the structured treatment plan as follows:  She has agreed to the Category 3 Plan.  Exercise goals: No exercise has been prescribed for now, while we concentrate on nutritional changes.  Behavioral modification strategies: no skipping meals and meal planning and cooking strategies.  She was informed of the importance of frequent follow-up visits to maximize her success with intensive lifestyle modifications for her multiple health conditions. She was informed we would discuss her lab results at her next visit unless there is a critical issue that needs to be addressed sooner. Brenda Hernandez agreed to keep her next visit at the agreed upon time to discuss these results.  Objective:   Blood pressure 137/85, pulse 97, temperature 97.9 F (36.6 C),  temperature source Oral, height 5\' 3"  (1.6 m), weight 267 lb (121.1 kg), last menstrual period 03/11/2020, SpO2 100 %. Body mass index is 47.3 kg/m.  EKG: Normal sinus rhythm, rate 97 BPM.  Indirect Calorimeter completed today shows a VO2 of 202 and a REE of 1408.  Her calculated basal metabolic rate is 1093 thus her basal metabolic rate is worse than expected.  General: Cooperative, alert, well developed, in no acute distress. HEENT: Conjunctivae and lids unremarkable. Cardiovascular: Regular rhythm.  Lungs: Normal work of breathing. Neurologic: No focal deficits.   Lab Results  Component Value Date   CREATININE 0.85 03/26/2020   BUN 11 03/26/2020   NA 138 03/26/2020   K 4.2 03/26/2020   CL 99 03/26/2020   CO2 20 03/26/2020   Lab Results  Component Value Date   ALT 12 03/26/2020   AST 14 03/26/2020   ALKPHOS 72 03/26/2020   BILITOT <0.2 03/26/2020   Lab Results  Component Value Date   HGBA1C 5.8 (H) 03/26/2020   HGBA1C 5.4 02/03/2020   HGBA1C 5.5 07/29/2019   HGBA1C 5.5 12/31/2018   HGBA1C 5.4 10/29/2018   Lab Results  Component Value Date   INSULIN 24.7 03/26/2020   Lab Results  Component Value Date   TSH  1.980 03/26/2020   Lab Results  Component Value Date   CHOL 183 03/26/2020   HDL 74 03/26/2020   LDLCALC 92 03/26/2020   TRIG 95 03/26/2020   CHOLHDL 2.4 02/03/2020   Lab Results  Component Value Date   WBC 7.6 03/26/2020   HGB 12.6 03/26/2020   HCT 38.2 03/26/2020   MCV 88 03/26/2020   PLT 298 03/26/2020   No results found for: IRON, TIBC, FERRITIN Obesity Behavioral Intervention:   Approximately 15 minutes were spent on the discussion below.  ASK: We discussed the diagnosis of obesity with Keyanna today and Brenda Hernandez agreed to give [place] permission to discuss obesity behavioral modification therapy today.  ASSESS: Mrytle has the diagnosis of obesity and her BMI today is 47.31. Nikyla is in the action stage of change.   ADVISE: Letitia was  educated on the multiple health risks of obesity as well as the benefit of weight loss to improve her health. She was advised of the need for long term treatment and the importance of lifestyle modifications to improve her current health and to decrease her risk of future health problems.  AGREE: Multiple dietary modification options and treatment options were discussed and Soyla agreed to follow the recommendations documented in the above note.  ARRANGE: Feliz was educated on the importance of frequent visits to treat obesity as outlined per CMS and USPSTF guidelines and agreed to schedule her next follow up appointment today.  Attestation Statements:   Reviewed by clinician on day of visit: allergies, medications, problem list, medical history, surgical history, family history, social history, and previous encounter notes.   I, Brenda Hernandez, am acting as transcriptionist for Dennard Nip, MD.  I have reviewed the above documentation for accuracy and completeness, and I agree with the above. - Dennard Nip, MD

## 2020-03-30 ENCOUNTER — Other Ambulatory Visit (HOSPITAL_COMMUNITY)
Admission: AD | Admit: 2020-03-30 | Discharge: 2020-03-30 | Disposition: A | Discharge status: Home / Self Care | Payer: Medicare HMO | Attending: Psychiatry | Admitting: Psychiatry

## 2020-03-30 DIAGNOSIS — R5383 Other fatigue: Secondary | ICD-10-CM | POA: Diagnosis not present

## 2020-03-30 LAB — CBC WITH DIFFERENTIAL/PLATELET
Abs Immature Granulocytes: 0.02 10*3/uL (ref 0.00–0.07)
Basophils Absolute: 0 10*3/uL (ref 0.0–0.1)
Basophils Relative: 0 %
Eosinophils Absolute: 0 10*3/uL (ref 0.0–0.5)
Eosinophils Relative: 0 %
HCT: 33.8 % — ABNORMAL LOW (ref 36.0–46.0)
Hemoglobin: 11.9 g/dL — ABNORMAL LOW (ref 12.0–15.0)
Immature Granulocytes: 0 %
Lymphocytes Relative: 46 %
Lymphs Abs: 3.2 10*3/uL (ref 0.7–4.0)
MCH: 30.4 pg (ref 26.0–34.0)
MCHC: 35.2 g/dL (ref 30.0–36.0)
MCV: 86.2 fL (ref 80.0–100.0)
Monocytes Absolute: 0.5 10*3/uL (ref 0.1–1.0)
Monocytes Relative: 8 %
Neutro Abs: 3.2 10*3/uL (ref 1.7–7.7)
Neutrophils Relative %: 46 %
Platelets: 268 10*3/uL (ref 150–400)
RBC: 3.92 MIL/uL (ref 3.87–5.11)
RDW: 17.2 % — ABNORMAL HIGH (ref 11.5–15.5)
WBC: 7 10*3/uL (ref 4.0–10.5)
nRBC: 0 % (ref 0.0–0.2)

## 2020-04-09 ENCOUNTER — Ambulatory Visit (INDEPENDENT_AMBULATORY_CARE_PROVIDER_SITE_OTHER): Payer: Medicare HMO | Admitting: Family Medicine

## 2020-04-09 ENCOUNTER — Other Ambulatory Visit: Payer: Self-pay

## 2020-04-09 ENCOUNTER — Encounter (INDEPENDENT_AMBULATORY_CARE_PROVIDER_SITE_OTHER): Payer: Self-pay | Admitting: Family Medicine

## 2020-04-09 VITALS — BP 112/84 | HR 94 | Temp 97.4°F | Ht 63.0 in | Wt 282.0 lb

## 2020-04-09 DIAGNOSIS — E559 Vitamin D deficiency, unspecified: Secondary | ICD-10-CM

## 2020-04-09 DIAGNOSIS — R7303 Prediabetes: Secondary | ICD-10-CM | POA: Diagnosis not present

## 2020-04-09 DIAGNOSIS — Z6841 Body Mass Index (BMI) 40.0 and over, adult: Secondary | ICD-10-CM | POA: Diagnosis not present

## 2020-04-09 MED ORDER — VITAMIN D (ERGOCALCIFEROL) 1.25 MG (50000 UNIT) PO CAPS: 50000.0000 [IU] | ORAL_CAPSULE | ORAL | 0 refills | Status: DC

## 2020-04-10 NOTE — Progress Notes (Signed)
Chief Complaint:   OBESITY Brenda Hernandez is here to discuss her progress with her obesity treatment plan along with follow-up of her obesity related diagnoses. Brenda Hernandez is on the Category 3 Plan and states she is following her eating plan approximately 75% of the time. Brenda Hernandez states she is doing 0 minutes 0 times per week.  Today's visit was #: 2 Starting weight: 287 lbs Starting date: 03/26/2020 Today's weight: 282 lbs Today's date: 04/09/2020 Total lbs lost to date: 5 Total lbs lost since last in-office visit: 5  Interim History: Brenda Hernandez did well with weight loss, but she deviated more on her plan. She made some better choices but her protein was not adequate.   Subjective:   1. Vitamin D deficiency Brenda Hernandez is on OTC Vit D, but last level was not yet at goal. She notes fatigue.  2. Pre-diabetes Brenda Hernandez is on metformin, and her A1c and fasting insulin are elevated. She often eats more simple carbohydrates.  Assessment/Plan:   1. Vitamin D deficiency Low Vitamin D level contributes to fatigue and are associated with obesity, breast, and colon cancer. Brenda Hernandez agreed to start prescription Vitamin D 50,000 IU every week with no refill. She will follow-up for routine testing of Vitamin D, at least 2-3 times per year to avoid over-replacement.  - Vitamin D, Ergocalciferol, (DRISDOL) 1.25 MG (50000 UNIT) CAPS capsule; Take 1 capsule (50,000 Units total) by mouth every 7 (seven) days.  Dispense: 4 capsule; Refill: 0  2. Pre-diabetes Brenda Hernandez will continue metformin, and to work on weight loss, exercise, and decreasing simple carbohydrates to help decrease the risk of diabetes. She was educated on pre-diabetes.  3. Class 3 severe obesity with serious comorbidity and body mass index (BMI) of 50.0 to 59.9 in adult, unspecified obesity type (Brenda Hernandez) Brenda Hernandez is currently in the action stage of change. As such, her goal is to continue with weight loss efforts. She has agreed to the Category 3 Plan  with breakfast options.   Behavioral modification strategies: increasing lean protein intake, decreasing simple carbohydrates and meal planning and cooking strategies.  Brenda Hernandez has agreed to follow-up with our clinic in 2 weeks. She was informed of the importance of frequent follow-up visits to maximize her success with intensive lifestyle modifications for her multiple health conditions.   Objective:   Blood pressure 112/84, pulse 94, temperature (!) 97.4 F (36.3 C), height 5\' 3"  (1.6 m), weight 282 lb (127.9 kg), last menstrual period 03/11/2020, SpO2 100 %. Body mass index is 49.95 kg/m.  General: Cooperative, alert, well developed, in no acute distress. HEENT: Conjunctivae and lids unremarkable. Cardiovascular: Regular rhythm.  Lungs: Normal work of breathing. Neurologic: No focal deficits.   Lab Results  Component Value Date   CREATININE 0.85 03/26/2020   BUN 11 03/26/2020   NA 138 03/26/2020   K 4.2 03/26/2020   CL 99 03/26/2020   CO2 20 03/26/2020   Lab Results  Component Value Date   ALT 12 03/26/2020   AST 14 03/26/2020   ALKPHOS 72 03/26/2020   BILITOT <0.2 03/26/2020   Lab Results  Component Value Date   HGBA1C 5.8 (H) 03/26/2020   HGBA1C 5.4 02/03/2020   HGBA1C 5.5 07/29/2019   HGBA1C 5.5 12/31/2018   HGBA1C 5.4 10/29/2018   Lab Results  Component Value Date   INSULIN 24.7 03/26/2020   Lab Results  Component Value Date   TSH 1.980 03/26/2020   Lab Results  Component Value Date   CHOL 183 03/26/2020  HDL 74 03/26/2020   LDLCALC 92 03/26/2020   TRIG 95 03/26/2020   CHOLHDL 2.4 02/03/2020   Lab Results  Component Value Date   WBC 7.0 03/30/2020   HGB 11.9 (L) 03/30/2020   HCT 33.8 (L) 03/30/2020   MCV 86.2 03/30/2020   PLT 268 03/30/2020   No results found for: IRON, TIBC, FERRITIN  Obesity Behavioral Intervention:   Approximately 15 minutes were spent on the discussion below.  ASK: We discussed the diagnosis of obesity with Brenda Hernandez  today and Brenda Hernandez agreed to give [place] permission to discuss obesity behavioral modification therapy today.  ASSESS: Brenda Hernandez has the diagnosis of obesity and her BMI today is 49.97. Brenda Hernandez is in the action stage of change.   ADVISE: Brenda Hernandez was educated on the multiple health risks of obesity as well as the benefit of weight loss to improve her health. She was advised of the need for long term treatment and the importance of lifestyle modifications to improve her current health and to decrease her risk of future health problems.  AGREE: Multiple dietary modification options and treatment options were discussed and Brenda Hernandez agreed to follow the recommendations documented in the above note.  ARRANGE: Brenda Hernandez was educated on the importance of frequent visits to treat obesity as outlined per CMS and USPSTF guidelines and agreed to schedule her next follow up appointment today.  Attestation Statements:   Reviewed by clinician on day of visit: allergies, medications, problem list, medical history, surgical history, family history, social history, and previous encounter notes.   I, Trixie Dredge, am acting as transcriptionist for Dennard Nip, MD.  I have reviewed the above documentation for accuracy and completeness, and I agree with the above. -  Dennard Nip, MD

## 2020-04-23 ENCOUNTER — Ambulatory Visit (INDEPENDENT_AMBULATORY_CARE_PROVIDER_SITE_OTHER): Payer: Medicare HMO

## 2020-04-23 ENCOUNTER — Other Ambulatory Visit: Payer: Self-pay

## 2020-04-23 ENCOUNTER — Encounter: Payer: Self-pay | Admitting: Internal Medicine

## 2020-04-23 ENCOUNTER — Ambulatory Visit (INDEPENDENT_AMBULATORY_CARE_PROVIDER_SITE_OTHER): Payer: Medicare HMO | Admitting: Internal Medicine

## 2020-04-23 DIAGNOSIS — M25562 Pain in left knee: Secondary | ICD-10-CM | POA: Diagnosis not present

## 2020-04-23 DIAGNOSIS — E538 Deficiency of other specified B group vitamins: Secondary | ICD-10-CM

## 2020-04-23 DIAGNOSIS — Z6841 Body Mass Index (BMI) 40.0 and over, adult: Secondary | ICD-10-CM | POA: Diagnosis not present

## 2020-04-23 DIAGNOSIS — E1165 Type 2 diabetes mellitus with hyperglycemia: Secondary | ICD-10-CM | POA: Diagnosis not present

## 2020-04-23 DIAGNOSIS — F3170 Bipolar disorder, currently in remission, most recent episode unspecified: Secondary | ICD-10-CM

## 2020-04-23 DIAGNOSIS — M19072 Primary osteoarthritis, left ankle and foot: Secondary | ICD-10-CM | POA: Diagnosis not present

## 2020-04-23 NOTE — Assessment & Plan Note (Signed)
S/p fall 3 wks ago on ice X ray Ibuprofen po Ice Sports med ref

## 2020-04-23 NOTE — Progress Notes (Signed)
Subjective:  Patient ID: Brenda Hernandez, female    DOB: 1975/04/09  Age: 45 y.o. MRN: [id number]  CC: Follow-up (2 month f/u)   HPI Brenda Hernandez presents for Vit B 12 and Vit D def F/u DM, bipolar disorder.  She is here with her dad. C/o L knee pain - fell on ice 3 wks ago - getting worse  Outpatient Medications Prior to Visit  Medication Sig Dispense Refill  . B Complex-Folic Acid (B COMPLEX PLUS) TABS Take 1 tablet by mouth daily. 100 tablet 5  . cholecalciferol (VITAMIN D3) 25 MCG (1000 UNIT) tablet Take 2 tablets (2,000 Units total) by mouth daily. 100 tablet 5  . clomiPRAMINE (ANAFRANIL) 75 MG capsule Take 75 mg by mouth 2 (two) times daily.    . cloZAPine (CLOZARIL) 100 MG tablet Take 500 mg by mouth daily.    Marland Kitchen ibuprofen (ADVIL) 400 MG tablet Take 1 tablet (400 mg total) by mouth every 8 (eight) hours as needed for moderate pain. 90 tablet 2  . levonorgestrel-ethinyl estradiol (ALESSE) 0.1-20 MG-MCG tablet Sronyx 0.1 mg-20 mcg tablet  TAKE 1 TABLET BY MOUTH EVERY DAY    . metFORMIN (GLUCOPHAGE) 1000 MG tablet Take 1,000 mg by mouth 2 (two) times daily with a meal.    . valproic acid (DEPAKENE) 250 MG/5ML solution Take 250 mg by mouth at bedtime.    . Vitamin D, Ergocalciferol, (DRISDOL) 1.25 MG (50000 UNIT) CAPS capsule Take 1 capsule (50,000 Units total) by mouth every 7 (seven) days. 4 capsule 0   No facility-administered medications prior to visit.    ROS: Review of Systems  Constitutional: Negative for activity change, appetite change, chills, fatigue and unexpected weight change.  HENT: Negative for congestion, mouth sores and sinus pressure.   Eyes: Negative for visual disturbance.  Respiratory: Negative for cough and chest tightness.   Gastrointestinal: Negative for abdominal pain and nausea.  Genitourinary: Negative for difficulty urinating, frequency and vaginal pain.  Musculoskeletal: Positive for gait problem. Negative for back pain.  Skin: Negative for pallor  and rash.  Neurological: Negative for dizziness, tremors, weakness, numbness and headaches.  Psychiatric/Behavioral: Negative for confusion and sleep disturbance.    Objective:  BP 120/86 (BP Location: Left Arm)   Pulse (!) 109   Temp 98.1 F (36.7 C) (Oral)   Ht 5\' 3"  (1.6 m)   Wt 285 lb 12.8 oz (129.6 kg)   SpO2 97%   BMI 50.63 kg/m   BP Readings from Last 3 Encounters:  04/23/20 120/86  04/09/20 112/84  03/26/20 137/85    Wt Readings from Last 3 Encounters:  04/23/20 285 lb 12.8 oz (129.6 kg)  04/09/20 282 lb (127.9 kg)  03/26/20 287 lb (130.2 kg)    Physical Exam Constitutional:      General: She is not in acute distress.    Appearance: She is well-developed. She is obese.  HENT:     Head: Normocephalic.     Right Ear: External ear normal.     Left Ear: External ear normal.     Nose: Nose normal.     Mouth/Throat:     Mouth: Oropharynx is clear and moist.  Eyes:     General:        Right eye: No discharge.        Left eye: No discharge.     Conjunctiva/sclera: Conjunctivae normal.     Pupils: Pupils are equal, round, and reactive to light.  Neck:     Thyroid: No  thyromegaly.     Vascular: No JVD.     Trachea: No tracheal deviation.  Cardiovascular:     Rate and Rhythm: Normal rate and regular rhythm.     Heart sounds: Normal heart sounds.  Pulmonary:     Effort: No respiratory distress.     Breath sounds: No stridor. No wheezing.  Abdominal:     General: Bowel sounds are normal. There is no distension.     Palpations: Abdomen is soft. There is no mass.     Tenderness: There is no abdominal tenderness. There is no guarding or rebound.  Musculoskeletal:        General: Tenderness present. No edema.     Cervical back: Normal range of motion and neck supple.  Lymphadenopathy:     Cervical: No cervical adenopathy.  Skin:    Findings: No erythema or rash.  Neurological:     Cranial Nerves: No cranial nerve deficit.     Motor: No abnormal muscle tone.      Coordination: Coordination normal.     Gait: Gait abnormal.     Deep Tendon Reflexes: Reflexes normal.  Psychiatric:        Mood and Affect: Mood and affect normal.        Behavior: Behavior normal.        Thought Content: Thought content normal.        Judgment: Judgment normal.   L knee w/pain in the joint, limping quite a bit  Lab Results  Component Value Date   WBC 7.0 03/30/2020   HGB 11.9 (L) 03/30/2020   HCT 33.8 (L) 03/30/2020   PLT 268 03/30/2020   GLUCOSE 95 03/26/2020   CHOL 183 03/26/2020   TRIG 95 03/26/2020   HDL 74 03/26/2020   LDLCALC 92 03/26/2020   ALT 12 03/26/2020   AST 14 03/26/2020   NA 138 03/26/2020   K 4.2 03/26/2020   CL 99 03/26/2020   CREATININE 0.85 03/26/2020   BUN 11 03/26/2020   CO2 20 03/26/2020   TSH 1.980 03/26/2020   HGBA1C 5.8 (H) 03/26/2020    No results found.  Assessment & Plan:    Walker Kehr, MD

## 2020-04-23 NOTE — Patient Instructions (Signed)
Ice the knee

## 2020-04-24 NOTE — Progress Notes (Signed)
Subjective:    I'm seeing this patient as a consultation for:  Dr. Alain Marion. Note will be routed back to referring provider/PCP.  CC: L knee pain  I, Molly Weber, LAT, ATC, am serving as scribe for Dr. Lynne Leader.  HPI: Pt is a 45 y/o female presenting w/ L knee pain after slipping and falling on the ice approximately 3 weeks ago.  She locates her pain to anterior and medial aspect of L knee.  No radiating pain weakness or numbness distally.  Radiating pain: sometimes L knee swelling: unsure L knee mechanical symptoms: sometimes Aggravating factors: walking up steps Treatments tried: ice, IBU  Diagnostic imaging: L knee XR- 04/23/20  Past medical history, Surgical history, Family history, Social history, Allergies, and medications have been entered into the medical record, reviewed.   Review of Systems: No new headache, visual changes, nausea, vomiting, diarrhea, constipation, dizziness, abdominal pain, skin rash, fevers, chills, night sweats, weight loss, swollen lymph nodes, body aches, joint swelling, muscle aches, chest pain, shortness of breath, mood changes, visual or auditory hallucinations.   Objective:    Vitals:   04/25/20 1027  BP: 122/78  Pulse: 95  SpO2: 98%   General: Well Developed, well nourished, and in no acute distress.  Obese Neuro/Psych: Alert and oriented x3, extra-ocular muscles intact, able to move all 4 extremities, sensation grossly intact. Skin: Warm and dry, no rashes noted.  Respiratory: Not using accessory muscles, speaking in full sentences, trachea midline.  Cardiovascular: Pulses palpable, no extremity edema. Abdomen: Does not appear distended. MSK: Left knee large body habitus.  Moderate effusion. Normal motion with crepitation. Tender palpation medial joint line. Stable ligamentous exam. Intact strength. Positive medial McMurray's test.  Lab and Radiology Results No results found for this or any previous visit (from the past 72  hour(s)). DG Knee Complete 4 Views Left  Result Date: 04/24/2020 CLINICAL DATA:  Left knee pain after fall 3 weeks ago. EXAM: LEFT KNEE - COMPLETE 4+ VIEW COMPARISON:  None. FINDINGS: No evidence of fracture, dislocation, or joint effusion. No significant joint space narrowing is noted. Mild osteophyte formation is noted medially. Soft tissues are unremarkable. IMPRESSION: Mild degenerative changes are noted medially. No acute abnormality seen in the left knee. Electronically Signed   By: Marijo Conception M.D.   On: 04/24/2020 09:34  I, Lynne Leader, personally (independently) visualized and performed the interpretation of the images attached in this note.  Procedure: Real-time Ultrasound Guided Injection of left knee superior lateral patellar space Device: Philips Affiniti 50G Images permanently stored and available for review in PACS Ultrasound inspection prior to injection reveals mild effusion.  Degenerative appearing medial meniscus. Verbal informed consent obtained.  Discussed risks and benefits of procedure. Warned about infection bleeding damage to structures skin hypopigmentation and fat atrophy among others. Patient expresses understanding and agreement Time-out conducted.   Noted no overlying erythema, induration, or other signs of local infection.   Skin prepped in a sterile fashion.   Local anesthesia: Topical Ethyl chloride.   With sterile technique and under real time ultrasound guidance:  40 mg of Kenalog and 2 mL of Marcaine injected into joint. Fluid seen entering the capsule.   Completed without difficulty   Pain immediately resolved suggesting accurate placement of the medication.   Advised to call if fevers/chills, erythema, induration, drainage, or persistent bleeding.   Images permanently stored and available for review in the ultrasound unit.  Impression: Technically successful ultrasound guided injection.  Impression and Recommendations:    Assessment and  Plan: 45 y.o. female with left knee pain due to probable medial meniscus tear.  Plan for steroid injection as above today.  Additionally refer to physical therapy.  Also recommend Voltaren gel.  Recheck in about 1 month.  Return sooner if needed.  PDMP not reviewed this encounter. Orders Placed This Encounter  Procedures   [place] LIMITED JOINT SPACE STRUCTURES LOW LEFT(NO LINKED CHARGES)    Standing Status:   Future    Number of Occurrences:   1    Standing Expiration Date:   10/23/2020    Order Specific Question:   Reason for Exam (SYMPTOM  OR DIAGNOSIS REQUIRED)    Answer:   left knee pain    Order Specific Question:   Preferred imaging location?    Answer:   [place]   Ambulatory referral to Physical Therapy    Referral Priority:   Routine    Referral Type:   Physical Medicine    Referral Reason:   Specialty Services Required    Requested Specialty:   Physical Therapy   No orders of the defined types were placed in this encounter.   Discussed warning signs or symptoms. Please see discharge instructions. Patient expresses understanding.   The above documentation has been reviewed and is accurate and complete Lynne Leader, M.D.

## 2020-04-25 ENCOUNTER — Ambulatory Visit: Payer: Self-pay

## 2020-04-25 ENCOUNTER — Ambulatory Visit (INDEPENDENT_AMBULATORY_CARE_PROVIDER_SITE_OTHER): Payer: Medicare HMO | Admitting: Family Medicine

## 2020-04-25 ENCOUNTER — Encounter: Payer: Self-pay | Admitting: Family Medicine

## 2020-04-25 ENCOUNTER — Other Ambulatory Visit: Payer: Self-pay

## 2020-04-25 ENCOUNTER — Ambulatory Visit (INDEPENDENT_AMBULATORY_CARE_PROVIDER_SITE_OTHER): Payer: Medicare HMO | Admitting: Physician Assistant

## 2020-04-25 VITALS — BP 122/78 | HR 95 | Ht 63.0 in | Wt 290.0 lb

## 2020-04-25 DIAGNOSIS — M25562 Pain in left knee: Secondary | ICD-10-CM

## 2020-04-25 NOTE — Patient Instructions (Signed)
Thank you for coming in today.  I've referred you to Physical Therapy.  Let us know if you don't hear from them in one week.  Please use voltaren gel up to 4x daily for pain as needed.   Call or go to the ER if you develop a large red swollen joint with extreme pain or oozing puss.   Recheck with me in 1 month.    Meniscus Tear  A meniscus tear is a knee injury that happens when a piece of the meniscus is torn. The meniscus is a thick, rubbery, wedge-shaped piece of cartilage in the knee. Each knee has two menisci sitting between the upper bone (femur) and lower bone (tibia) that form the knee joint. Each meniscus acts as a shock absorber for the knee. A torn meniscus is a common knee injury, ranging from mild to severe. Surgery may be needed to repair a severe tear. What are the causes? This condition may be caused by kneeling, squatting, twisting, or pivoting movements. Sports-related injuries are the most common cause, often resulting from:  Running and stopping suddenly.  Changing direction.  Being tackled or knocked off your feet.  Lifting or carrying heavy weights. As people get older, their menisci get thinner and weaker. Tears can happen more easily in older people, for example, when climbing stairs. What increases the risk? You are more likely to develop this condition if you:  Play contact sports.  Have a job that requires kneeling or squatting.  Are female.  Are over 61 years old. What are the signs or symptoms? Symptoms of this condition include:  Knee pain, especially at the side of the knee joint. You may feel pain immediately after injury, or hear a pop and feel pain later.  A feeling that your knee is clicking, catching, locking, or giving way (weakness, instability).  Not being able to fully bend or extend your knee.  Bruising or swelling in your knee. How is this diagnosed? This condition may be diagnosed based on your symptoms and a physical exam. You  may also have tests, such as:  X-rays.  MRI.  Arthroscopy. This is a procedure to look inside your knee with a narrow surgical telescope. You may be referred to a knee specialist (orthopedic surgeon). How is this treated? Treatment for this injury depends on the severity of the tear. Treatment for a mild tear may include:  Rest.  Medicine to reduce pain and swelling, usually a nonsteroidal anti-inflammatory drug (NSAID), like ibuprofen.  A knee brace, sleeve, or wrap.  Using crutches or a walker to keep weight off your knee and help with walking.  Exercises to strengthen your knee (physical therapy). You may need surgery if you have a severe tear or if other treatments fail. Follow these instructions at home: If you have a brace, sleeve, or wrap:  Wear it, as told by your health care provider. Remove it, only as told by your health care provider.  Loosen the brace, sleeve, or wrap if your toes tingle, become numb, or turn cold and blue.  Keep the brace, sleeve, or wrap clean.  If the brace, sleeve, or wrap is not waterproof: ? Do not let it get wet. ? Cover it with a watertight covering when you take a bath or shower. Managing pain, stiffness, and swelling  Take over-the-counter and prescription medicines only as told by your health care provider.  If directed, put ice on your knee. To do this: ? If you have a removable  brace, sleeve, or wrap, remove it as told by your health care provider. ? Put ice in a plastic bag. ? Place a towel between your skin and the bag. ? Leave the ice on for 20 minutes, 2-3 times per day. ? Remove the ice if your skin turns bright red. This is very important. If you cannot feel pain, heat, or cold, you have a greater risk of damage to the area.  Move your toes often to reduce stiffness and swelling.  Raise (elevate) the injured area above the level of your heart while you are sitting or lying down.   Activity  Do not use the injured limb  to support your body weight until your health care provider says that you can. Use crutches or a walker as told by your health care provider.  Return to your normal activities as told by your health care provider. Ask your health care provider what activities are safe for you.  Perform range-of-motion exercises only as told by your health care provider.  Begin doing exercises to strengthen your knee and leg muscles only as told by your health care provider. After you recover, your health care provider may recommend these exercises to help prevent another injury. General instructions  Use a knee brace, sleeve, or wrap as told by your health care provider.  Ask your health care provider when it is safe to drive if you have a brace, sleeve, or wrap on your knee.  Do not use any products that contain nicotine or tobacco, such as cigarettes, e-cigarettes, and chewing tobacco. If you need help quitting, ask your health care provider.  Ask your health care provider if the medicine prescribed to you: ? Requires you to avoid driving or using heavy machinery. ? Can cause constipation. You may need to take these actions to prevent or treat constipation:  Drink enough fluid to keep your urine pale yellow.  Take over-the-counter or prescription medicines.  Eat foods that are high in fiber, such as beans, whole grains, and fresh fruits and vegetables.  Limit foods that are high in fat and processed sugars, such as fried or sweet foods.  Keep all follow-up visits. This is important. Contact a health care provider if:  You have a fever.  Your knee becomes red, tender, or swollen.  Your pain medicine is not controlling your pain.  Your symptoms get worse or do not improve after 2 weeks of home care. Summary  A meniscus tear is a knee injury that happens when a piece of the meniscus is torn.  Treatment for this injury depends on the severity of the tear. You may need surgery if you have a  severe tear or if other treatments fail.  Rest, ice, and raise (elevate) your injured knee, as told by your health care provider. This will help lessen pain and swelling.  Contact a health care provider if you have new symptoms, your symptoms worsen, or they do not improve after 2 weeks of home care.  Keep all follow-up visits. This is important. This information is not intended to replace advice given to you by your health care provider. Make sure you discuss any questions you have with your health care provider. Document Revised: 07/13/2019 Document Reviewed: 07/13/2019 Elsevier Patient Education  [place].

## 2020-04-26 ENCOUNTER — Ambulatory Visit: Payer: Medicare HMO | Admitting: Family Medicine

## 2020-04-28 DIAGNOSIS — Z6841 Body Mass Index (BMI) 40.0 and over, adult: Secondary | ICD-10-CM | POA: Insufficient documentation

## 2020-04-28 NOTE — Assessment & Plan Note (Addendum)
Chronic.  Partially due to her medications for her schizoaffective/bipolar disorder.  She is on Metformin now.  Brenda Hernandez is trying to work on her diet

## 2020-04-28 NOTE — Assessment & Plan Note (Signed)
On vitamin B12 

## 2020-04-28 NOTE — Assessment & Plan Note (Signed)
On Metformin now.

## 2020-04-28 NOTE — Assessment & Plan Note (Signed)
On Anafranil and valproic acid

## 2020-04-29 ENCOUNTER — Ambulatory Visit (INDEPENDENT_AMBULATORY_CARE_PROVIDER_SITE_OTHER): Payer: Medicare HMO | Admitting: Physician Assistant

## 2020-04-29 ENCOUNTER — Encounter (INDEPENDENT_AMBULATORY_CARE_PROVIDER_SITE_OTHER): Payer: Self-pay | Admitting: Physician Assistant

## 2020-04-29 ENCOUNTER — Other Ambulatory Visit: Payer: Self-pay

## 2020-04-29 VITALS — BP 126/81 | HR 110 | Temp 98.3°F | Ht 63.0 in | Wt 281.0 lb

## 2020-04-29 DIAGNOSIS — E559 Vitamin D deficiency, unspecified: Secondary | ICD-10-CM

## 2020-04-29 DIAGNOSIS — M25561 Pain in right knee: Secondary | ICD-10-CM | POA: Diagnosis not present

## 2020-04-29 DIAGNOSIS — Z6841 Body Mass Index (BMI) 40.0 and over, adult: Secondary | ICD-10-CM

## 2020-04-30 NOTE — Progress Notes (Unsigned)
Chief Complaint:   OBESITY Brenda Hernandez is here to discuss her progress with her obesity treatment plan along with follow-up of her obesity related diagnoses. Brenda Hernandez is on the Category 3 Plan with breakfast options and states she is following her eating plan approximately 50-60% of the time. Brenda Hernandez states she is doing 0 minutes 0 times per week.  Today's visit was #: 3 Starting weight: 287 lbs Starting date: 03/26/2020 Today's weight: 281 lbs Today's date: 04/29/2020 Total lbs lost to date: 6 Total lbs lost since last in-office visit: 1  Interim History: Brenda Hernandez reports having a problem finding the food on the plan. She is not weighing her protein at dinner. She is sometimes skipping lunch. She voices today that she does not like many of the meat options for dinner.  Subjective:   1. Vitamin D deficiency Brenda Hernandez is on Vit D 2,000 units daily, and she denies nausea, vomiting, or muscle weakness.  2. Acute pain of right knee Brenda Hernandez fell on the ice recently and she continues to have knee pain. She will be seeing physical therapy in the next few days.  Assessment/Plan:   1. Vitamin D deficiency Low Vitamin D level contributes to fatigue and are associated with obesity, breast, and colon cancer. Brenda Hernandez agreed to continue taking OTC Vitamin D 2,000 IU daily and will follow-up for routine testing of Vitamin D, at least 2-3 times per year to avoid over-replacement.  2. Acute pain of right knee Brenda Hernandez will follow up with physical therapy at her scheduled appointment.  3. Class 3 severe obesity with serious comorbidity and body mass index (BMI) of 45.0 to 49.9 in adult, unspecified obesity type (Brenda Hernandez) Brenda Hernandez is currently in the action stage of change. As such, her goal is to continue with weight loss efforts. She has agreed to the Category 3 Plan and keeping a food journal and adhering to recommended goals of 400-500 calories and 40 grams of protein at supper daily.   Exercise goals: No  exercise has been prescribed at this time.  Behavioral modification strategies: increasing lean protein intake and meal planning and cooking strategies.  Brenda Hernandez has agreed to follow-up with our clinic in 2 weeks. She was informed of the importance of frequent follow-up visits to maximize her success with intensive lifestyle modifications for her multiple health conditions.   Objective:   Blood pressure 126/81, pulse (!) 110, temperature 98.3 F (36.8 C), height 5\' 3"  (1.6 m), last menstrual period 04/11/2020, SpO2 98 %. Body mass index is 51.37 kg/m.  General: Cooperative, alert, well developed, in no acute distress. HEENT: Conjunctivae and lids unremarkable. Cardiovascular: Regular rhythm.  Lungs: Normal work of breathing. Neurologic: No focal deficits.   Lab Results  Component Value Date   CREATININE 0.85 03/26/2020   BUN 11 03/26/2020   NA 138 03/26/2020   K 4.2 03/26/2020   CL 99 03/26/2020   CO2 20 03/26/2020   Lab Results  Component Value Date   ALT 12 03/26/2020   AST 14 03/26/2020   ALKPHOS 72 03/26/2020   BILITOT <0.2 03/26/2020   Lab Results  Component Value Date   HGBA1C 5.8 (H) 03/26/2020   HGBA1C 5.4 02/03/2020   HGBA1C 5.5 07/29/2019   HGBA1C 5.5 12/31/2018   HGBA1C 5.4 10/29/2018   Lab Results  Component Value Date   INSULIN 24.7 03/26/2020   Lab Results  Component Value Date   TSH 1.980 03/26/2020   Lab Results  Component Value Date   CHOL 183 03/26/2020  HDL 74 03/26/2020   LDLCALC 92 03/26/2020   TRIG 95 03/26/2020   CHOLHDL 2.4 02/03/2020   Lab Results  Component Value Date   WBC 7.0 03/30/2020   HGB 11.9 (L) 03/30/2020   HCT 33.8 (L) 03/30/2020   MCV 86.2 03/30/2020   PLT 268 03/30/2020   No results found for: IRON, TIBC, FERRITIN  Obesity Behavioral Intervention:   Approximately 15 minutes were spent on the discussion below.  ASK: We discussed the diagnosis of obesity with Brenda Hernandez today and Brenda Hernandez agreed to give [place]  permission to discuss obesity behavioral modification therapy today.  ASSESS: Brenda Hernandez has the diagnosis of obesity and her BMI today is 49.79. Brenda Hernandez is in the action stage of change.   ADVISE: Brenda Hernandez was educated on the multiple health risks of obesity as well as the benefit of weight loss to improve her health. She was advised of the need for long term treatment and the importance of lifestyle modifications to improve her current health and to decrease her risk of future health problems.  AGREE: Multiple dietary modification options and treatment options were discussed and Brenda Hernandez agreed to follow the recommendations documented in the above note.  ARRANGE: Brenda Hernandez was educated on the importance of frequent visits to treat obesity as outlined per CMS and USPSTF guidelines and agreed to schedule her next follow up appointment today.  Attestation Statements:   Reviewed by clinician on day of visit: allergies, medications, problem list, medical history, surgical history, family history, social history, and previous encounter notes.   Brenda Hernandez, am acting as transcriptionist for [employer], PA-C.  I have reviewed the above documentation for accuracy and completeness, and I agree with the above. Abby Potash, PA-C

## 2020-05-02 ENCOUNTER — Other Ambulatory Visit: Payer: Self-pay

## 2020-05-02 ENCOUNTER — Ambulatory Visit: Payer: Medicare HMO | Attending: Family Medicine | Admitting: Physical Therapy

## 2020-05-02 ENCOUNTER — Encounter: Payer: Self-pay | Admitting: Physical Therapy

## 2020-05-02 DIAGNOSIS — M25562 Pain in left knee: Secondary | ICD-10-CM | POA: Diagnosis not present

## 2020-05-02 DIAGNOSIS — M25662 Stiffness of left knee, not elsewhere classified: Secondary | ICD-10-CM | POA: Diagnosis not present

## 2020-05-02 DIAGNOSIS — R600 Localized edema: Secondary | ICD-10-CM | POA: Insufficient documentation

## 2020-05-02 DIAGNOSIS — R262 Difficulty in walking, not elsewhere classified: Secondary | ICD-10-CM | POA: Diagnosis not present

## 2020-05-02 NOTE — Therapy (Signed)
[facility] [place], [place], [address] Phone: [phone]   Fax:  [phone]  Physical Therapy Evaluation  Patient Details  Name: Brenda Hernandez MRN: [id number] Date of Birth: 01/01/1976 Referring Provider (PT): Marikay Alar Date: 05/02/2020   PT End of Session - 05/02/20 1712    Visit Number 1    Number of Visits 12    Date for PT Re-Evaluation 06/14/20    Authorization Type [employer]    PT Start Time 1608    PT Stop Time 1650    PT Time Calculation (min) 42 min    Activity Tolerance Patient tolerated treatment well    Behavior During Therapy Flat affect           Past Medical History:  Diagnosis Date  . Abnormal pap   . Bipolar 1 disorder ([place])   . GERD (gastroesophageal reflux disease)   . Pre-diabetes   . Schizophrenia ([place])   . Swallowing difficulty     History reviewed. No pertinent surgical history.  There were no vitals filed for this visit.    Subjective Assessment - 05/02/20 1614    Subjective Patient slipped on the ice about 3-4 weeks ago, she hurt her left knee.  She had x-rays that were negative.  MD felt like she had a possible meniscus tear.  She reports that the leg has gotten worse over the past week or so, she had a cortisone injection that did not help, she has been using Voltaren gel without any relief    Pertinent History bipolar, schizophrenia, GERD    Limitations Lifting;Standing;Walking;House hold activities    How long can you stand comfortably? reports some difficulty but can stand 5 minutes    How long can you walk comfortably? very diffiuclty walking, slow and very antalgic    Patient Stated Goals have less pain, walk better    Currently in Pain? Yes    Pain Score 2     Pain Location Knee    Pain Orientation Left;Anterior;Medial;Lateral    Pain Descriptors / Indicators Aching    Pain Type Acute pain    Pain Radiating Towards denies    Pain Onset 1 to 4 weeks ago     Pain Frequency Constant    Aggravating Factors  standing, walking, stairs, standing from sitting pain up to 9.5/10    Pain Relieving Factors lying down pain at best a 2-3/10    Effect of Pain on Daily Activities limits all ADL's. difficulty walking, standing              [facility] PT Assessment - 05/02/20 0001      Assessment   Medical Diagnosis left knee pain with possible medial meniscus tear    Referring Provider (PT) Georgina Snell    Onset Date/Surgical Date 04/04/20    Prior Therapy no      Precautions   Precautions None      Balance Screen   Has the patient fallen in the past 6 months Yes    How many times? 2    Has the patient had a decrease in activity level because of a fear of falling?  Yes    Is the patient reluctant to leave their home because of a fear of falling?  No      Home Environment   Additional Comments 14 stairs into the home, does not live alone, does housework      Prior Function   Level of  Independence Independent    Vocation Unemployed    Leisure was walking some for exercise, did go to the gym in 2021      ROM / Strength   AROM / PROM / Strength AROM;PROM;Strength      AROM   Overall AROM Comments patient with pain for all active motions    AROM Assessment Site Knee    Right/Left Knee Left    Left Knee Extension 20    Left Knee Flexion 89      PROM   PROM Assessment Site Knee    Right/Left Knee Left    Left Knee Extension 15    Left Knee Flexion 96      Strength   Overall Strength Comments pain with MMT, very painful with all motions and tests    Strength Assessment Site Knee    Right/Left Knee Left    Left Knee Flexion 4-/5    Left Knee Extension 3+/5      Palpation   Palpation comment tender to palpation anterior and medial      Ambulation/Gait   Gait Comments no device, very slow, very antalgic gait ont eh left, very shortened stance phase on the left , ho pt type pattern      Standardized Balance Assessment   Standardized Balance  Assessment Timed Up and Go Test      Timed Up and Go Test   Normal TUG (seconds) 40    TUG Comments no device                      Objective measurements completed on examination: See above findings.                 PT Short Term Goals - 05/02/20 1759      PT SHORT TERM GOAL #1   Title independentwith initial HEP    Time 1    Period Weeks    Status New             PT Long Term Goals - 05/02/20 1759      PT LONG TERM GOAL #1   Title understand RICE    Time 8    Period Weeks    Status New      PT LONG TERM GOAL #2   Title incresae left knee AROM to = the right    Time 8    Period Weeks    Status New      PT LONG TERM GOAL #3   Title go up and down stairs step over step    Time 8    Period Weeks    Status New      PT LONG TERM GOAL #4   Title decrease pain overall 50%    Time 8    Period Weeks    Status New      PT LONG TERM GOAL #5   Title increase strength of the left knee to 4+/5    Time 8    Period Weeks    Status New                  Plan - 05/02/20 1714    Clinical Impression Statement Patient and father are present for evaluation, the father did a lot of the history as patient at times was quiet.  They report a fall on ice about 4 weeks ago.  She is not sure if she hit the knee on the ground but when  describing it appeared that she could have strained the medial side.  She has had negative x-rays.  Injection did not help, Voltaren gel did not help.  The dad reports that she did injure the same knee a few years ago.  She seems to be in a lot of pain her gait is without device, very slow and very short stance on the left, hops to pattern.  She has limited ROM and strength due to pain with all testing    Personal Factors and Comorbidities Comorbidity 3+    Comorbidities bipolar, schizophrenia, GERD    Stability/Clinical Decision Making Evolving/Moderate complexity    Clinical Decision Making Low    Rehab Potential Good     PT Frequency 2x / week    PT Duration 8 weeks    PT Treatment/Interventions ADLs/Self Care Home Management;Electrical Stimulation;Cryotherapy;Ultrasound;Gait training;Balance training;Therapeutic exercise;Therapeutic activities;Functional mobility training;Stair training;Patient/family education;Manual techniques;Vasopneumatic Device    PT Next Visit Plan slowly try to get her moving to help with pain and her function    Consulted and Agree with Plan of Care Patient           Patient will benefit from skilled therapeutic intervention in order to improve the following deficits and impairments:  Abnormal gait,Decreased range of motion,Difficulty walking,Cardiopulmonary status limiting activity,Decreased activity tolerance,Pain,Decreased balance,Decreased mobility,Decreased strength,Increased edema  Visit Diagnosis: Acute pain of left knee - Plan: PT plan of care cert/re-cert  Stiffness of left knee, not elsewhere classified - Plan: PT plan of care cert/re-cert  Difficulty in walking, not elsewhere classified - Plan: PT plan of care cert/re-cert  Localized edema - Plan: PT plan of care cert/re-cert     Problem List Patient Active Problem List   Diagnosis Date Noted  . Morbid obesity with BMI of 50.0-59.9, adult ([place]) 04/28/2020  . Knee pain, left 04/23/2020  . Diabetes mellitus ([place]) 02/19/2020  . Schizoaffective disorder ([place]) 02/19/2020  . Vitamin D deficiency 02/19/2020  . Low serum vitamin B12 02/19/2020  . Low back pain 02/19/2020  . Knee pain, right 02/19/2020  . BIPOLAR AFFECTIVE DISORDER 03/22/2009  . GERD 03/22/2009    Sumner Boast., PT 05/02/2020, 6:09 PM  [address] [place], [place], [address] Phone: [phone]   Fax:  [phone]  Name: Brenda Hernandez MRN: [id number] Date of Birth: 12-Aug-1975

## 2020-05-02 NOTE — Patient Instructions (Signed)
Access Code: [email or web address] URL: [email or web address] Date: 05/02/2020 Prepared by: Lum Babe  Exercises Seated Long Arc Quad - 2 x daily - 7 x weekly - 2 sets - 10 reps - 3 hold Supine Short Arc Quad - 2 x daily - 7 x weekly - 2 sets - 10 reps - 3 hold Supine Heel Slide - 2 x daily - 7 x weekly - 2 sets - 10 reps - 3 hold Seated Heel Raise - 2 x daily - 7 x weekly - 2 sets - 10 reps - 1 hold Seated Toe Raise - 2 x daily - 7 x weekly - 2 sets - 10 reps - 1 hold

## 2020-05-04 ENCOUNTER — Other Ambulatory Visit (HOSPITAL_COMMUNITY)
Admission: RE | Admit: 2020-05-04 | Discharge: 2020-05-04 | Disposition: A | Discharge status: Home / Self Care | Payer: Medicare HMO | Attending: Psychiatry | Admitting: Psychiatry

## 2020-05-04 DIAGNOSIS — F259 Schizoaffective disorder, unspecified: Secondary | ICD-10-CM | POA: Diagnosis not present

## 2020-05-04 DIAGNOSIS — Z79899 Other long term (current) drug therapy: Secondary | ICD-10-CM | POA: Diagnosis not present

## 2020-05-04 LAB — CBC WITH DIFFERENTIAL/PLATELET
Abs Immature Granulocytes: 0.02 10*3/uL (ref 0.00–0.07)
Basophils Absolute: 0 10*3/uL (ref 0.0–0.1)
Basophils Relative: 0 %
Eosinophils Absolute: 0 10*3/uL (ref 0.0–0.5)
Eosinophils Relative: 0 %
HCT: 36.2 % (ref 36.0–46.0)
Hemoglobin: 11.9 g/dL — ABNORMAL LOW (ref 12.0–15.0)
Immature Granulocytes: 0 %
Lymphocytes Relative: 34 %
Lymphs Abs: 3.1 10*3/uL (ref 0.7–4.0)
MCH: 29.8 pg (ref 26.0–34.0)
MCHC: 32.9 g/dL (ref 30.0–36.0)
MCV: 90.7 fL (ref 80.0–100.0)
Monocytes Absolute: 0.7 10*3/uL (ref 0.1–1.0)
Monocytes Relative: 8 %
Neutro Abs: 5.4 10*3/uL (ref 1.7–7.7)
Neutrophils Relative %: 58 %
Platelets: 301 10*3/uL (ref 150–400)
RBC: 3.99 MIL/uL (ref 3.87–5.11)
RDW: 16.4 % — ABNORMAL HIGH (ref 11.5–15.5)
WBC: 9.2 10*3/uL (ref 4.0–10.5)
nRBC: 0 % (ref 0.0–0.2)

## 2020-05-10 ENCOUNTER — Ambulatory Visit: Payer: Medicare HMO | Admitting: Physical Therapy

## 2020-05-10 ENCOUNTER — Ambulatory Visit: Payer: Medicare HMO

## 2020-05-10 ENCOUNTER — Other Ambulatory Visit (INDEPENDENT_AMBULATORY_CARE_PROVIDER_SITE_OTHER): Payer: Self-pay | Admitting: Family Medicine

## 2020-05-10 ENCOUNTER — Other Ambulatory Visit: Payer: Self-pay

## 2020-05-10 ENCOUNTER — Encounter: Payer: Self-pay | Admitting: Physical Therapy

## 2020-05-10 DIAGNOSIS — R600 Localized edema: Secondary | ICD-10-CM | POA: Diagnosis not present

## 2020-05-10 DIAGNOSIS — M25662 Stiffness of left knee, not elsewhere classified: Secondary | ICD-10-CM | POA: Diagnosis not present

## 2020-05-10 DIAGNOSIS — R262 Difficulty in walking, not elsewhere classified: Secondary | ICD-10-CM

## 2020-05-10 DIAGNOSIS — E559 Vitamin D deficiency, unspecified: Secondary | ICD-10-CM

## 2020-05-10 DIAGNOSIS — M25562 Pain in left knee: Secondary | ICD-10-CM

## 2020-05-10 NOTE — Therapy (Signed)
[address]. [place], [place], [address] Phone: [phone]   Fax:  [phone]  Physical Therapy Treatment  Patient Details  Name: Brenda Hernandez MRN: [id number] Date of Birth: [birth year] Referring Provider (PT): Marikay Alar Date: 05/10/2020   PT End of Session - 05/10/20 1002    Visit Number 2    Number of Visits 12    Date for PT Re-Evaluation 06/14/20    Authorization Type [employer]    PT Start Time 0930    PT Stop Time 1015    PT Time Calculation (min) 45 min    Activity Tolerance Patient tolerated treatment well;Patient limited by pain    Behavior During Therapy [facility] for tasks assessed/performed           Past Medical History:  Diagnosis Date  . Abnormal pap   . Bipolar 1 disorder ([place])   . GERD (gastroesophageal reflux disease)   . Pre-diabetes   . Schizophrenia ([place])   . Swallowing difficulty     History reviewed. No pertinent surgical history.  There were no vitals filed for this visit.   Subjective Assessment - 05/10/20 0935    Subjective discouraged that she could not do all exercises on hep. Heel slides cause too much pain.    Currently in Pain? Yes    Pain Location Knee    Pain Orientation Left                             [facility] Adult PT Treatment/Exercise - 05/10/20 0001      Exercises   Exercises Knee/Hip      Knee/Hip Exercises: Aerobic   Nustep L3 x 5 min      Knee/Hip Exercises: Seated   Long Arc Quad 2 sets;Left;10 reps    Long Arc Quad Weight 2 lbs.    Marching Left;1 set;10 reps    Marching Limitations 2    Hamstring Curl Strengthening;Left;2 sets;15 reps    Hamstring Limitations red Tband    Sit to [employer] 2 sets;without UE support;10 reps      Modalities   Modalities Cryotherapy      Cryotherapy   Number Minutes Cryotherapy 10 Minutes    Cryotherapy Location Knee    Type of Cryotherapy Ice pack      Manual Therapy   Manual Therapy Passive  ROM    Passive ROM L knee flex and Ext                    PT Short Term Goals - 05/02/20 1759      PT SHORT TERM GOAL #1   Title independentwith initial HEP    Time 1    Period Weeks    Status New             PT Long Term Goals - 05/02/20 1759      PT LONG TERM GOAL #1   Title understand RICE    Time 8    Period Weeks    Status New      PT LONG TERM GOAL #2   Title incresae left knee AROM to = the right    Time 8    Period Weeks    Status New      PT LONG TERM GOAL #3   Title go up and down stairs step over step    Time 8    Period Weeks  Status New      PT LONG TERM GOAL #4   Title decrease pain overall 50%    Time 8    Period Weeks    Status New      PT LONG TERM GOAL #5   Title increase strength of the left knee to 4+/5    Time 8    Period Weeks    Status New                 Plan - 05/10/20 1003    Clinical Impression Statement Pt enters clinic on RW reporting L knee pain and discomfort. Pt very negative throughout session wanting to only a few reps of each exercise. Cues to complete full ROM with LAQ and HS curls. Some initial pain with MT but reports that it got better as reps progressed. Compensation noted with sit to stands, avoiding pressure on L knee.    Personal Factors and Comorbidities Comorbidity 3+    Comorbidities bipolar, schizophrenia, GERD    Stability/Clinical Decision Making Evolving/Moderate complexity    Rehab Potential Good    PT Frequency 2x / week    PT Duration 8 weeks    PT Treatment/Interventions ADLs/Self Care Home Management;Electrical Stimulation;Cryotherapy;Ultrasound;Gait training;Balance training;Therapeutic exercise;Therapeutic activities;Functional mobility training;Stair training;Patient/family education;Manual techniques;Vasopneumatic Device    PT Next Visit Plan slowly try to get her moving to help with pain and her function           Patient will benefit from skilled therapeutic intervention  in order to improve the following deficits and impairments:  Abnormal gait,Decreased range of motion,Difficulty walking,Cardiopulmonary status limiting activity,Decreased activity tolerance,Pain,Decreased balance,Decreased mobility,Decreased strength,Increased edema  Visit Diagnosis: Stiffness of left knee, not elsewhere classified  Difficulty in walking, not elsewhere classified  Acute pain of left knee  Localized edema     Problem List Patient Active Problem List   Diagnosis Date Noted  . Morbid obesity with BMI of 50.0-59.9, adult ([place]) 04/28/2020  . Knee pain, left 04/23/2020  . Diabetes mellitus ([place]) 02/19/2020  . Schizoaffective disorder ([place]) 02/19/2020  . Vitamin D deficiency 02/19/2020  . Low serum vitamin B12 02/19/2020  . Low back pain 02/19/2020  . Knee pain, right 02/19/2020  . BIPOLAR AFFECTIVE DISORDER 03/22/2009  . GERD 03/22/2009    Scot Jun 05/10/2020, 10:06 AM  [facility] [address]. [place], [place], [address] Phone: [phone]   Fax:  [phone]  Name: Moe Brier MRN: [id number] Date of Birth: [birth year]

## 2020-05-14 ENCOUNTER — Encounter (INDEPENDENT_AMBULATORY_CARE_PROVIDER_SITE_OTHER): Payer: Self-pay | Admitting: Physician Assistant

## 2020-05-14 ENCOUNTER — Ambulatory Visit (INDEPENDENT_AMBULATORY_CARE_PROVIDER_SITE_OTHER): Payer: Medicare HMO | Admitting: Physician Assistant

## 2020-05-14 ENCOUNTER — Other Ambulatory Visit: Payer: Self-pay

## 2020-05-14 VITALS — BP 112/78 | HR 99 | Temp 98.3°F | Ht 63.0 in | Wt 279.0 lb

## 2020-05-14 DIAGNOSIS — R7303 Prediabetes: Secondary | ICD-10-CM | POA: Diagnosis not present

## 2020-05-14 DIAGNOSIS — E559 Vitamin D deficiency, unspecified: Secondary | ICD-10-CM

## 2020-05-14 DIAGNOSIS — Z6841 Body Mass Index (BMI) 40.0 and over, adult: Secondary | ICD-10-CM

## 2020-05-14 MED ORDER — VITAMIN D (ERGOCALCIFEROL) 1.25 MG (50000 UNIT) PO CAPS: 50000.0000 [IU] | ORAL_CAPSULE | ORAL | 0 refills | Status: DC

## 2020-05-15 ENCOUNTER — Encounter: Payer: Self-pay | Admitting: Physical Therapy

## 2020-05-15 ENCOUNTER — Ambulatory Visit: Payer: Medicare HMO | Attending: Family Medicine | Admitting: Physical Therapy

## 2020-05-15 DIAGNOSIS — R600 Localized edema: Secondary | ICD-10-CM | POA: Diagnosis not present

## 2020-05-15 DIAGNOSIS — R262 Difficulty in walking, not elsewhere classified: Secondary | ICD-10-CM | POA: Diagnosis not present

## 2020-05-15 DIAGNOSIS — M25562 Pain in left knee: Secondary | ICD-10-CM

## 2020-05-15 DIAGNOSIS — M25662 Stiffness of left knee, not elsewhere classified: Secondary | ICD-10-CM | POA: Insufficient documentation

## 2020-05-15 NOTE — Therapy (Signed)
[facility]. [place], [place], [address] Phone: [phone]   Fax:  [phone]  Physical Therapy Treatment  Patient Details  Name: Brenda Hernandez MRN: [id number] Date of Birth: [birth year] Referring Provider (PT): Marikay Alar Date: 05/15/2020   PT End of Session - 05/15/20 1601    Visit Number 3    Number of Visits 12    Date for PT Re-Evaluation 06/14/20    Authorization Type [employer]    PT Start Time 1524    PT Stop Time 1605    PT Time Calculation (min) 41 min    Activity Tolerance Patient tolerated treatment well;Patient limited by pain    Behavior During Therapy [facility] for tasks assessed/performed           Past Medical History:  Diagnosis Date  . Abnormal pap   . Bipolar 1 disorder ([place])   . GERD (gastroesophageal reflux disease)   . Pre-diabetes   . Schizophrenia ([place])   . Swallowing difficulty     History reviewed. No pertinent surgical history.  There were no vitals filed for this visit.   Subjective Assessment - 05/15/20 1530    Subjective Doing ok, was not able to go to church due to pain    Currently in Pain? Yes    Pain Score 3     Pain Location Knee    Pain Orientation Left                             [facility] Adult PT Treatment/Exercise - 05/15/20 0001      Knee/Hip Exercises: Aerobic   Recumbent Bike L1 x 2 min    Nustep L3 x 6 min      Knee/Hip Exercises: Seated   Marching Left;1 set;10 reps    Marching Limitations 2    Hamstring Curl Strengthening;Left;2 sets;15 reps    Hamstring Limitations red Tband    Sit to [place] 2 sets;without UE support;10 reps      Knee/Hip Exercises: Supine   Heel Slides AROM;Left;2 sets;10 reps    Straight Leg Raises AROM;2 sets;10 reps;5 reps      Modalities   Modalities Cryotherapy      Cryotherapy   Number Minutes Cryotherapy 10 Minutes    Cryotherapy Location Knee    Type of Cryotherapy Ice pack                     PT Short Term Goals - 05/02/20 1759      PT SHORT TERM GOAL #1   Title independentwith initial HEP    Time 1    Period Weeks    Status New             PT Long Term Goals - 05/02/20 1759      PT LONG TERM GOAL #1   Title understand RICE    Time 8    Period Weeks    Status New      PT LONG TERM GOAL #2   Title incresae left knee AROM to = the right    Time 8    Period Weeks    Status New      PT LONG TERM GOAL #3   Title go up and down stairs step over step    Time 8    Period Weeks    Status New      PT LONG TERM GOAL #  4   Title decrease pain overall 50%    Time 8    Period Weeks    Status New      PT LONG TERM GOAL #5   Title increase strength of the left knee to 4+/5    Time 8    Period Weeks    Status New                 Plan - 05/15/20 1601    Clinical Impression Statement Pt ~ 9 minutes late today. she has a fluid gait with RW today. Pt reports L knee pain with most activities but not enough pain to stop when given the option. Cues for LE placement needed with sit to stands. She did increase her sit to stand tolerance. Reports difficulty with heel slides at home so pt was instructed on doing them in today's treatment with pillow case under heel.    Personal Factors and Comorbidities Comorbidity 3+    Comorbidities bipolar, schizophrenia, GERD    Stability/Clinical Decision Making Evolving/Moderate complexity    Rehab Potential Good    PT Frequency 2x / week    PT Duration 8 weeks    PT Treatment/Interventions ADLs/Self Care Home Management;Electrical Stimulation;Cryotherapy;Ultrasound;Gait training;Balance training;Therapeutic exercise;Therapeutic activities;Functional mobility training;Stair training;Patient/family education;Manual techniques;Vasopneumatic Device    PT Next Visit Plan slowly try to get her moving to help with pain and her function           Patient will benefit from skilled therapeutic intervention in  order to improve the following deficits and impairments:  Abnormal gait,Decreased range of motion,Difficulty walking,Cardiopulmonary status limiting activity,Decreased activity tolerance,Pain,Decreased balance,Decreased mobility,Decreased strength,Increased edema  Visit Diagnosis: Stiffness of left knee, not elsewhere classified  Difficulty in walking, not elsewhere classified  Acute pain of left knee  Localized edema     Problem List Patient Active Problem List   Diagnosis Date Noted  . Morbid obesity with BMI of 50.0-59.9, adult ([place]) 04/28/2020  . Knee pain, left 04/23/2020  . Diabetes mellitus ([place]) 02/19/2020  . Schizoaffective disorder ([place]) 02/19/2020  . Vitamin D deficiency 02/19/2020  . Low serum vitamin B12 02/19/2020  . Low back pain 02/19/2020  . Knee pain, right 02/19/2020  . BIPOLAR AFFECTIVE DISORDER 03/22/2009  . GERD 03/22/2009    Scot Jun 05/15/2020, 4:05 PM  [address] [place], [place], [address] Phone: [phone]   Fax:  [phone]  Name: Juanell Saffo MRN: [id number] Date of Birth: 1975/04/23

## 2020-05-15 NOTE — Progress Notes (Signed)
Chief Complaint:   OBESITY Brenda Hernandez is here to discuss her progress with her obesity treatment plan along with follow-up of her obesity related diagnoses. Brenda Hernandez is on the Category 3 Plan and keeping a food journal and adhering to recommended goals of 400-500 calories and 40 grams of protein at supper daily and states she is following her eating plan approximately 50% of the time. Brenda Hernandez states she is doing strengthening for 15-20 minutes 7 times per week.  Today's visit was #: 4 Starting weight: 287 lbs Starting date: 03/26/2020 Today's weight: 279 lbs Today's date: 05/14/2020 Total lbs lost to date: 8 Total lbs lost since last in-office visit: 2  Interim History: Brenda Hernandez reports that she has been writing down her food due to having a hard time operating [employer] app. She has not skipped meals. Her family has been bringing her fried fish sandwiches and regular chic-fila sandwiches for dinner.  Subjective:   1. Vitamin D deficiency Brenda Hernandez is on Vit D weekly. Last Vit D level was not at goal.  2. Pre-diabetes Brenda Hernandez is on metformin, and she denies nausea, vomiting, or diarrhea. She denies polyphagia.  Assessment/Plan:   1. Vitamin D deficiency Low Vitamin D level contributes to fatigue and are associated with obesity, breast, and colon cancer. We will refill prescription Vitamin D for 1 month. Brenda Hernandez will follow-up for routine testing of Vitamin D, at least 2-3 times per year to avoid over-replacement.  - Vitamin D, Ergocalciferol, (DRISDOL) 1.25 MG (50000 UNIT) CAPS capsule; Take 1 capsule (50,000 Units total) by mouth every 7 (seven) days.  Dispense: 4 capsule; Refill: 0  2. Pre-diabetes Brenda Hernandez will continue metformin, and will continue to work on weight loss, exercise, and decreasing simple carbohydrates to help decrease the risk of diabetes.   3. Class 3 severe obesity with serious comorbidity and body mass index (BMI) of 45.0 to 49.9 in adult, unspecified obesity  type ([place]) Brenda Hernandez is currently in the action stage of change. As such, her goal is to continue with weight loss efforts. She has agreed to the Category 3 Plan and keeping a food journal and adhering to recommended goals of 400-500 calories and 40 grams of protein at supper daily.   Exercise goals: As is.  Behavioral modification strategies: meal planning and cooking strategies and keeping healthy foods in the home.  Brenda Hernandez has agreed to follow-up with our clinic in 2 weeks. She was informed of the importance of frequent follow-up visits to maximize her success with intensive lifestyle modifications for her multiple health conditions.   Objective:   Blood pressure 112/78, pulse 99, temperature 98.3 F (36.8 C), height 5\' 3"  (1.6 m), weight 279 lb (126.6 kg), SpO2 95 %. Body mass index is 49.42 kg/m.  General: Cooperative, alert, well developed, in no acute distress. HEENT: Conjunctivae and lids unremarkable. Cardiovascular: Regular rhythm.  Lungs: Normal work of breathing. Neurologic: No focal deficits.   Lab Results  Component Value Date   CREATININE 0.85 03/26/2020   BUN 11 03/26/2020   NA 138 03/26/2020   K 4.2 03/26/2020   CL 99 03/26/2020   CO2 20 03/26/2020   Lab Results  Component Value Date   ALT 12 03/26/2020   AST 14 03/26/2020   ALKPHOS 72 03/26/2020   BILITOT <0.2 03/26/2020   Lab Results  Component Value Date   HGBA1C 5.8 (H) 03/26/2020   HGBA1C 5.4 02/03/2020   HGBA1C 5.5 07/29/2019   HGBA1C 5.5 12/31/2018   HGBA1C 5.4 10/29/2018  Lab Results  Component Value Date   INSULIN 24.7 03/26/2020   Lab Results  Component Value Date   TSH 1.980 03/26/2020   Lab Results  Component Value Date   CHOL 183 03/26/2020   HDL 74 03/26/2020   LDLCALC 92 03/26/2020   TRIG 95 03/26/2020   CHOLHDL 2.4 02/03/2020   Lab Results  Component Value Date   WBC 9.2 05/04/2020   HGB 11.9 (L) 05/04/2020   HCT 36.2 05/04/2020   MCV 90.7 05/04/2020   PLT 301  05/04/2020   No results found for: IRON, TIBC, FERRITIN  Obesity Behavioral Intervention:   Approximately 15 minutes were spent on the discussion below.  ASK: We discussed the diagnosis of obesity with Brenda Hernandez today and Brenda Hernandez agreed to give [place] permission to discuss obesity behavioral modification therapy today.  ASSESS: Brenda Hernandez has the diagnosis of obesity and her BMI today is 49.44. Brenda Hernandez is in the action stage of change.   ADVISE: Brenda Hernandez was educated on the multiple health risks of obesity as well as the benefit of weight loss to improve her health. She was advised of the need for long term treatment and the importance of lifestyle modifications to improve her current health and to decrease her risk of future health problems.  AGREE: Multiple dietary modification options and treatment options were discussed and Brenda Hernandez agreed to follow the recommendations documented in the above note.  ARRANGE: Brenda Hernandez was educated on the importance of frequent visits to treat obesity as outlined per CMS and USPSTF guidelines and agreed to schedule her next follow up appointment today.  Attestation Statements:   Reviewed by clinician on day of visit: allergies, medications, problem list, medical history, surgical history, family history, social history, and previous encounter notes.   [name], am acting as transcriptionist for [employer], PA-C.  I have reviewed the above documentation for accuracy and completeness, and I agree with the above. Abby Potash, PA-C

## 2020-05-16 DIAGNOSIS — Z23 Encounter for immunization: Secondary | ICD-10-CM | POA: Diagnosis not present

## 2020-05-17 ENCOUNTER — Ambulatory Visit: Payer: Medicare HMO | Admitting: Physical Therapy

## 2020-05-17 ENCOUNTER — Other Ambulatory Visit: Payer: Self-pay

## 2020-05-17 ENCOUNTER — Encounter: Payer: Self-pay | Admitting: Physical Therapy

## 2020-05-17 DIAGNOSIS — M25562 Pain in left knee: Secondary | ICD-10-CM

## 2020-05-17 DIAGNOSIS — R262 Difficulty in walking, not elsewhere classified: Secondary | ICD-10-CM

## 2020-05-17 DIAGNOSIS — M25662 Stiffness of left knee, not elsewhere classified: Secondary | ICD-10-CM

## 2020-05-17 DIAGNOSIS — R600 Localized edema: Secondary | ICD-10-CM | POA: Diagnosis not present

## 2020-05-17 NOTE — Therapy (Signed)
[address] [place], [place], [address] Phone: [phone]   Fax:  [phone]  Physical Therapy Treatment  Patient Details  Name: Brenda Hernandez MRN: [id number] Date of Birth: Jun 06, 1975 Referring Provider (PT): Brenda Hernandez Date: 05/17/2020   PT End of Session - 05/17/20 1051    Visit Number 4    Number of Visits 12    Date for PT Re-Evaluation 06/14/20    Authorization Type [employer]    PT Start Time 4709    PT Stop Time 1100    PT Time Calculation (min) 45 min    Activity Tolerance No increased pain    Behavior During Therapy Anxious;WFL for tasks assessed/performed           Past Medical History:  Diagnosis Date  . Abnormal pap   . Bipolar 1 disorder ([place])   . GERD (gastroesophageal reflux disease)   . Pre-diabetes   . Schizophrenia ([place])   . Swallowing difficulty     History reviewed. No pertinent surgical history.  There were no vitals filed for this visit.   Subjective Assessment - 05/17/20 1016    Subjective Pt reports no new changes    Pertinent History bipolar, schizophrenia, GERD    Limitations Lifting;Standing;Walking;House hold activities    Currently in Pain? Yes    Pain Score 7     Pain Location Knee    Pain Orientation Left                             [facility] Adult PT Treatment/Exercise - 05/17/20 0001      Ambulation/Gait   Ambulation/Gait Yes    Ambulation/Gait Assistance 5: Supervision    Ambulation Distance (Feet) 100 Feet    Assistive device None    Gait Pattern Decreased arm swing - right;Decreased arm swing - left    Stairs Yes    Stairs Assistance 5: Supervision    Stair Management Technique Two rails;Step to pattern    Number of Stairs 9    Height of Stairs 4    Gait Comments No AD decrease stance time on LLE with L lateral trunk lean      Knee/Hip Exercises: Aerobic   Nustep L1 x 6 min no UE      Knee/Hip Exercises: Seated   Long Arc Quad  2 sets;Left;10 reps    Long Arc Quad Weight 2 lbs.    Hamstring Curl Strengthening;Left;2 sets;15 reps    Hamstring Limitations yellow tband    Sit to Sand without UE support;3 sets;10 reps      Modalities   Modalities Cryotherapy      Cryotherapy   Number Minutes Cryotherapy 10 Minutes    Cryotherapy Location Knee    Type of Cryotherapy Ice pack                    PT Short Term Goals - 05/02/20 1759      PT SHORT TERM GOAL #1   Title independentwith initial HEP    Time 1    Period Weeks    Status New             PT Long Term Goals - 05/17/20 1057      PT LONG TERM GOAL #1   Title understand RICE    Status Partially Met      PT LONG TERM GOAL #2   Title incresae left  knee AROM to = the right    Status On-going      PT LONG TERM GOAL #3   Title go up and down stairs step over step    Status On-going                 Plan - 05/17/20 1052    Clinical Impression Statement L knee pain reported throughout session with all activity. Pt able to progress to gait without AD. Encouragement needed to complete interventions, pt would often attempt to negotiate the reps with the exercises. Step to gait with stair negotiation, when instructed to attempt a step through pattern she said she has not been doing it that way.    Personal Factors and Comorbidities Comorbidity 3+    Comorbidities bipolar, schizophrenia, GERD    Stability/Clinical Decision Making Evolving/Moderate complexity    Rehab Potential Good    PT Frequency 2x / week    PT Duration 8 weeks    PT Treatment/Interventions ADLs/Self Care Home Management;Electrical Stimulation;Cryotherapy;Ultrasound;Gait training;Balance training;Therapeutic exercise;Therapeutic activities;Functional mobility training;Stair training;Patient/family education;Manual techniques;Vasopneumatic Device    PT Next Visit Plan slowly try to get her moving to help with pain and her function           Patient will benefit  from skilled therapeutic intervention in order to improve the following deficits and impairments:  Abnormal gait,Decreased range of motion,Difficulty walking,Cardiopulmonary status limiting activity,Decreased activity tolerance,Pain,Decreased balance,Decreased mobility,Decreased strength,Increased edema  Visit Diagnosis: Difficulty in walking, not elsewhere classified  Acute pain of left knee  Stiffness of left knee, not elsewhere classified  Localized edema     Problem List Patient Active Problem List   Diagnosis Date Noted  . Morbid obesity with BMI of 50.0-59.9, adult ([place]) 04/28/2020  . Knee pain, left 04/23/2020  . Diabetes mellitus ([place]) 02/19/2020  . Schizoaffective disorder ([place]) 02/19/2020  . Vitamin D deficiency 02/19/2020  . Low serum vitamin B12 02/19/2020  . Low back pain 02/19/2020  . Knee pain, right 02/19/2020  . BIPOLAR AFFECTIVE DISORDER 03/22/2009  . GERD 03/22/2009    Scot Jun, PTA 05/17/2020, 10:59 AM  [place]. [place], [place], [address] Phone: [phone]   Fax:  [phone]  [name] Date of Birth: 1975-12-27

## 2020-05-20 ENCOUNTER — Encounter: Payer: Self-pay | Admitting: Physical Therapy

## 2020-05-20 ENCOUNTER — Ambulatory Visit: Payer: Medicare HMO | Admitting: Physical Therapy

## 2020-05-20 ENCOUNTER — Other Ambulatory Visit: Payer: Self-pay

## 2020-05-20 DIAGNOSIS — M25562 Pain in left knee: Secondary | ICD-10-CM | POA: Diagnosis not present

## 2020-05-20 DIAGNOSIS — M25662 Stiffness of left knee, not elsewhere classified: Secondary | ICD-10-CM | POA: Diagnosis not present

## 2020-05-20 DIAGNOSIS — R600 Localized edema: Secondary | ICD-10-CM

## 2020-05-20 DIAGNOSIS — R262 Difficulty in walking, not elsewhere classified: Secondary | ICD-10-CM

## 2020-05-20 NOTE — Therapy (Signed)
[place]. [place], [place], [address] Phone: [phone]   Fax:  [phone]  Physical Therapy Treatment  Patient Details  Name: Brenda Hernandez MRN: [id number] Date of Birth: 12/18/1975 Referring Provider (PT): Marikay Alar Date: 05/20/2020   PT End of Session - 05/20/20 1329    Visit Number 5    Number of Visits 12    Date for PT Re-Evaluation 06/14/20    Authorization Type [employer]    PT Start Time 1300    PT Stop Time 1340    PT Time Calculation (min) 40 min    Activity Tolerance Patient limited by pain    Behavior During Therapy Anxious           Past Medical History:  Diagnosis Date  . Abnormal pap   . Bipolar 1 disorder ([place])   . GERD (gastroesophageal reflux disease)   . Pre-diabetes   . Schizophrenia ([place])   . Swallowing difficulty     History reviewed. No pertinent surgical history.  There were no vitals filed for this visit.   Subjective Assessment - 05/20/20 1259    Subjective "Its hurting"    Pertinent History bipolar, schizophrenia, GERD    How long can you stand comfortably? reports some difficulty but can stand 5 minutes    Pain Score 9     Pain Location Knee    Pain Orientation Left              [facility] PT Assessment - 05/20/20 0001      AROM   AROM Assessment Site Knee    Right/Left Knee Left    Left Knee Extension 7    Left Knee Flexion 101                         [facility] Adult PT Treatment/Exercise - 05/20/20 0001      Knee/Hip Exercises: Aerobic   Nustep L2 x 6 min      Knee/Hip Exercises: Seated   Long Arc Quad Left;10 reps;1 set   stopped due to pain   Long Arc Quad Weight 2 lbs.    Hamstring Curl Strengthening;Left;2 sets;15 reps    Hamstring Limitations yellow tband      Modalities   Modalities Cryotherapy      Cryotherapy   Number Minutes Cryotherapy 10 Minutes    Cryotherapy Location Knee    Type of Cryotherapy Ice pack       Manual Therapy   Manual Therapy Passive ROM    Manual therapy comments Reports pain, but inconsistent and could not locate it    Passive ROM L knee flex and Ext                    PT Short Term Goals - 05/20/20 1330      PT SHORT TERM GOAL #1   Title independentwith initial HEP    Status Achieved             PT Long Term Goals - 05/20/20 1330      PT LONG TERM GOAL #1   Title understand RICE    Status Partially Met      PT LONG TERM GOAL #2   Title incresae left knee AROM to = the right    Status On-going      PT LONG TERM GOAL #3   Title go up and down stairs step over step  Status On-going      PT LONG TERM GOAL #4   Title decrease pain overall 50%    Status On-going                 Plan - 05/20/20 1331    Clinical Impression Statement Pt enters clinic ambulating with RW reporting increase pain. Pt reported pain with all interventions. She was able to tolerated aerobic warm up on NuStep with tolerable pain. Pain reported with PROM but she could not specify the location. Pain reported with seated Hs curls that she point to her tibia mid shaft. She was unable to tolerated LAQ needed to discontinue intervention. Pt has difficulty standing due to reports of pain. She has an upcoming apt with the MD on March 10 th    Personal Factors and Comorbidities Comorbidity 3+    Comorbidities bipolar, schizophrenia, GERD    Stability/Clinical Decision Making Evolving/Moderate complexity    Rehab Potential Good    PT Frequency 2x / week    PT Duration 8 weeks    PT Treatment/Interventions ADLs/Self Care Home Management;Electrical Stimulation;Cryotherapy;Ultrasound;Gait training;Balance training;Therapeutic exercise;Therapeutic activities;Functional mobility training;Stair training;Patient/family education;Manual techniques;Vasopneumatic Device    PT Next Visit Plan Advised pt to cancel PT apt wednesday and return after seeing MD. Get report form MD.            Patient will benefit from skilled therapeutic intervention in order to improve the following deficits and impairments:  Abnormal gait,Decreased range of motion,Difficulty walking,Cardiopulmonary status limiting activity,Decreased activity tolerance,Pain,Decreased balance,Decreased mobility,Decreased strength,Increased edema  Visit Diagnosis: Stiffness of left knee, not elsewhere classified  Localized edema  Acute pain of left knee  Difficulty in walking, not elsewhere classified     Problem List Patient Active Problem List   Diagnosis Date Noted  . Morbid obesity with BMI of 50.0-59.9, adult ([place]) 04/28/2020  . Knee pain, left 04/23/2020  . Diabetes mellitus ([place]) 02/19/2020  . Schizoaffective disorder ([place]) 02/19/2020  . Vitamin D deficiency 02/19/2020  . Low serum vitamin B12 02/19/2020  . Low back pain 02/19/2020  . Knee pain, right 02/19/2020  . BIPOLAR AFFECTIVE DISORDER 03/22/2009  . GERD 03/22/2009    Scot Jun 05/20/2020, 1:35 PM  [place]. [place], [place], [address] Phone: [phone]   Fax:  [phone]  Name: Lakela Kuba MRN: [id number] Date of Birth: [birth year]

## 2020-05-22 ENCOUNTER — Ambulatory Visit: Payer: Medicare HMO | Admitting: Physical Therapy

## 2020-05-22 NOTE — Progress Notes (Signed)
   I, Wendy Poet, LAT, ATC, am serving as scribe for Dr. Lynne Leader.  Brenda Hernandez is a 45 y.o. female who presents to [place] [facility] today for f/u of L ant-med knee pain that began after slipping and falling on the ice.  She was last seen by Dr. Georgina Snell on 04/25/20 and had a L knee injection.  She was also referred to PT of which she's completed 5 visits.  Since her last visit w/ Dr. Georgina Snell, pt reports that she feels like her L knee is getting worse and notes that her pain has changed to a pulsating-type pain and rates her pain at a 10/10.  She has been walking w/ a rolling walker.  She is having pain w/ ADLs and IADLs such as bathing and toileting.  Diagnostic imaging: L knee XR- 04/23/20  Pertinent review of systems: No fevers or chills  Relevant historical information: Diabetes, schizoaffective disorder and bipolar disorder.  Obesity   Exam:  BP 120/82 (BP Location: Right Arm, Patient Position: Sitting, Cuff Size: Large)   Pulse (!) 104   Ht 5\' 3"  (1.6 m)   Wt 271 lb 9.6 oz (123.2 kg)   SpO2 96%   BMI 48.11 kg/m  General: Well Developed, well nourished, and in no acute distress.   MSK: Left knee decreased motion with crepitation.  Large thigh to calf ratio. Significant antalgic gait using a walker to ambulate.  And difficulty standing from a seated position.    Lab and Radiology Results  EXAM: LEFT KNEE - COMPLETE 4+ VIEW  COMPARISON:  None.  FINDINGS: No evidence of fracture, dislocation, or joint effusion. No significant joint space narrowing is noted. Mild osteophyte formation is noted medially. Soft tissues are unremarkable.  IMPRESSION: Mild degenerative changes are noted medially. No acute abnormality seen in the left knee.   Electronically Signed   By: Marijo Conception M.D.   On: 04/24/2020 09:34  I, Lynne Leader, personally (independently) visualized and performed the interpretation of the images attached in this  note.     Assessment and Plan: 45 y.o. female with left knee pain after fall.  Patient is failing conservative management in fact worsening.  She is now having significant difficulty with mobility.  Patient had a positive McMurray's test initially.  Remain concern for meniscus tear.  At this point we will proceed to MRI to further characterize cause of pain and disability.  Recheck following MRI.   PDMP not reviewed this encounter. Orders Placed This Encounter  Procedures  . MR Knee Left  Wo Contrast    Standing Status:   Future    Standing Expiration Date:   05/23/2021    Order Specific Question:   What is the patient's sedation requirement?    Answer:   No Sedation    Order Specific Question:   Does the patient have a pacemaker or implanted devices?    Answer:   No    Order Specific Question:   Preferred imaging location?    Answer:   Product/process development scientist (table limit-350lbs)   No orders of the defined types were placed in this encounter.    Discussed warning signs or symptoms. Please see discharge instructions. Patient expresses understanding.   The above documentation has been reviewed and is accurate and complete Lynne Leader, M.D.

## 2020-05-23 ENCOUNTER — Encounter: Payer: Self-pay | Admitting: Family Medicine

## 2020-05-23 ENCOUNTER — Ambulatory Visit (INDEPENDENT_AMBULATORY_CARE_PROVIDER_SITE_OTHER): Payer: Medicare HMO | Admitting: Family Medicine

## 2020-05-23 ENCOUNTER — Other Ambulatory Visit: Payer: Self-pay

## 2020-05-23 ENCOUNTER — Telehealth: Payer: Self-pay | Admitting: Family Medicine

## 2020-05-23 ENCOUNTER — Telehealth: Payer: Self-pay | Admitting: Internal Medicine

## 2020-05-23 ENCOUNTER — Telehealth (INDEPENDENT_AMBULATORY_CARE_PROVIDER_SITE_OTHER): Payer: Self-pay

## 2020-05-23 VITALS — BP 120/82 | HR 104 | Ht 63.0 in | Wt 271.6 lb

## 2020-05-23 DIAGNOSIS — M25562 Pain in left knee: Secondary | ICD-10-CM

## 2020-05-23 NOTE — Telephone Encounter (Signed)
Noted. I did get her authorized for med center [address]

## 2020-05-23 NOTE — Telephone Encounter (Signed)
Called pt to schedule AWV with [facility]. Patient stated she will call me back to schedule. Please schedule appt if pt calls the office.

## 2020-05-23 NOTE — Telephone Encounter (Signed)
Patient's father called stating that per the advise of [employer], they are going to take her to the ED tomorrow so that she can have her MRI done urgently due to her pain level.  Call back #  [phone] Jonelle Sports (father)

## 2020-05-23 NOTE — Patient Instructions (Signed)
Thank you for coming in today.  You should hear from MRI scheduling within 1 week. If you do not hear please let me know.   Recheck following MRI.   Use the walker as needed.   We will consider a brace after the MRI.   A cane is less stabilizing than the walker.

## 2020-05-24 ENCOUNTER — Ambulatory Visit: Payer: Medicare HMO | Admitting: Physical Therapy

## 2020-05-24 NOTE — Telephone Encounter (Signed)
If the emergency room plan does not work you are currently scheduled for MRI at [facility] on Saturday.  If you do not plan on keeping that appointment please cancel it so that someone else can get utilized in that spot

## 2020-05-24 NOTE — Telephone Encounter (Signed)
Spoke w/ pt and she is planning to attend her already scheduled MRI appointment at [facility] on 05/25/20.

## 2020-05-25 ENCOUNTER — Other Ambulatory Visit: Payer: Self-pay

## 2020-05-25 ENCOUNTER — Ambulatory Visit (INDEPENDENT_AMBULATORY_CARE_PROVIDER_SITE_OTHER): Payer: Medicare HMO

## 2020-05-25 DIAGNOSIS — M25362 Other instability, left knee: Secondary | ICD-10-CM | POA: Diagnosis not present

## 2020-05-25 DIAGNOSIS — M25562 Pain in left knee: Secondary | ICD-10-CM | POA: Diagnosis not present

## 2020-05-27 NOTE — Progress Notes (Signed)
MRI shows meniscus tear and looks to be insufficiency fracture some of the bone in the knee.  Additionally the radiologist sees a lot of fluid in the knee joint.  Recommend that you schedule a follow-up appointment with me in the near future.  This is not an emergency and can wait till next week if needed.  I recommend that you use crutches or a walker to take the weight off of the knee.  This would be the primary treatment for insufficiency fracture.

## 2020-05-27 NOTE — Progress Notes (Signed)
I, Wendy Poet, LAT, ATC, am serving as scribe for Dr. Lynne Leader.  Brenda Hernandez is a 45 y.o. female who presents to [place] [facility] today for f/u acute L knee pain that began after slipping and falling on the ice. Pt was last seen by Dr. Georgina Snell on 05/23/20 and was advised to proceed to MRI to further characterize her cause of pain. She had a prior L knee injection on 04/25/20 and has completed 5 PT sessions.  She has been walking w/ a rolling walker.  Today, pt reports that her L knee con't to worsen.  Dx imaging: 05/25/20 L knee MRI  04/23/20 L knee XR  Pertinent review of systems: No fevers or chills  Relevant historical information: Diabetes.  Morbid obesity.  Schizoaffective disorder.   Exam:  BP 120/76 (BP Location: Right Arm, Patient Position: Sitting, Cuff Size: Large)   Pulse (!) 101   Ht 5\' 3"  (1.6 m)   Wt 268 lb (121.6 kg)   SpO2 96%   BMI 47.47 kg/m  General: Well Developed, well nourished, and in no acute distress.   MSK: Left knee obese reduced motion Patient is having difficulty coordinating using a walker correctly to significantly reduce weightbearing    Lab and Radiology Results No results found for this or any previous visit (from the past 72 hour(s)). MR Knee Left  Wo Contrast  Result Date: 05/26/2020 CLINICAL DATA:  Knee instability, left knee pain EXAM: MRI OF THE LEFT KNEE WITHOUT CONTRAST TECHNIQUE: Multiplanar, multisequence MR imaging of the knee was performed. No intravenous contrast was administered. COMPARISON:  None. FINDINGS: MENISCI Medial: Radial tear of the posterior of the medial meniscus with peripheral meniscal extrusion. Lateral: Intact. LIGAMENTS Cruciates: ACL and PCL are intact. Collaterals: Medial collateral ligament is intact. Lateral collateral ligament complex is intact. CARTILAGE Patellofemoral: Partial-thickness cartilage loss of the patellofemoral compartment. Medial: Full-thickness cartilage loss of the  medial femorotibial compartment. Lateral: Partial thickness cartilage loss of the lateral femorotibial compartment. JOINT: Large joint effusion. Edema in Hoffa's fat. No plical thickening. POPLITEAL FOSSA: Popliteus tendon is intact. Tiny Baker's cyst. EXTENSOR MECHANISM: Intact quadriceps tendon. Intact patellar tendon. Intact lateral patellar retinaculum. Intact medial patellar retinaculum. Intact MPFL. BONES: Subchondral linear low signal in the medial femoral condyle weight-bearing surface with severe surrounding marrow edema consistent with a subchondral insufficiency fracture without depression or displacement. Subchondral linear low signal in the medial tibial plateau surface with severe surrounding marrow edema consistent with a subchondral insufficiency fracture without depression or displacement. Subchondral linear low signal in the lateral femoral condyle weight-bearing surface with severe surrounding marrow edema consistent with a subchondral insufficiency fracture without depression or displacement. No aggressive osseous lesion. Other: Incompletely visualized fluid collection in the medial aspect of the distal femoral diaphysis measuring approximately 18 by 10 mm in axial dimension of indeterminate etiology. If there is further clinical concern, recommend an MRI of the left thigh. IMPRESSION: 1. Radial tear of the posterior of the medial meniscus with peripheral meniscal extrusion. 2. Nondisplaced, nondepressed subchondral insufficiency fractures of the medial and lateral femoral condyles and medial tibial plateau with severe surrounding marrow edema. 3. Tricompartmental cartilage abnormalities as described above. 4. Incompletely visualized fluid collection in the medial aspect of the distal femoral diaphysis measuring approximately 18 by 10 mm in axial dimension of indeterminate etiology. This may reflect a liquified hematoma, but an infectious etiology (abscess) cannot be excluded. If there is further  clinical concern, recommend an MRI of the left  thigh. 5. Large knee joint effusion likely related to the knee pathology described above. If there is clinical concern regarding septic arthritis given the soft tissue fluid collection, recommend arthrocentesis. Electronically Signed   By: Kathreen Devoid   On: 05/26/2020 08:43   I, Lynne Leader, personally (independently) visualized and performed the interpretation of the images attached in this note. Significant bone edema seen on MRI.     Assessment and Plan: 44 y.o. female with left knee pain.  MRI findings concerning for subchondral insufficiency fracture.  That is the main source of pain.  Fundamentally this needs to be treated with reduced weightbearing.  She already is using a walker which is the right idea however she is having significant difficulty coordinating using a walker.  Extensive time in clinic today try to teach her how to use a walker and got some progress.  She will benefit from physical therapy for dedicated teaching with how to use a walker.  Recheck in about a month after reduce weightbearing.   PDMP not reviewed this encounter. Orders Placed This Encounter  Procedures  . Ambulatory referral to Physical Therapy    Referral Priority:   Routine    Referral Type:   Physical Medicine    Referral Reason:   Specialty Services Required    Requested Specialty:   Physical Therapy   No orders of the defined types were placed in this encounter.    Discussed warning signs or symptoms. Please see discharge instructions. Patient expresses understanding.   The above documentation has been reviewed and is accurate and complete Lynne Leader, M.D.  Total encounter time 20 minutes including face-to-face time with the patient and, reviewing past medical record, and charting on the date of service.   Treatment plan and options.  MRI findings.  Reviewed how to use a walker.

## 2020-05-28 ENCOUNTER — Other Ambulatory Visit: Payer: Self-pay

## 2020-05-28 ENCOUNTER — Ambulatory Visit (INDEPENDENT_AMBULATORY_CARE_PROVIDER_SITE_OTHER): Payer: Medicare HMO | Admitting: Physician Assistant

## 2020-05-28 ENCOUNTER — Ambulatory Visit (INDEPENDENT_AMBULATORY_CARE_PROVIDER_SITE_OTHER): Payer: Medicare HMO | Admitting: Family Medicine

## 2020-05-28 ENCOUNTER — Encounter: Payer: Self-pay | Admitting: Family Medicine

## 2020-05-28 ENCOUNTER — Encounter (INDEPENDENT_AMBULATORY_CARE_PROVIDER_SITE_OTHER): Payer: Self-pay | Admitting: Physician Assistant

## 2020-05-28 VITALS — BP 126/82 | HR 107 | Temp 98.0°F | Ht 63.0 in | Wt 268.0 lb

## 2020-05-28 VITALS — BP 120/76 | HR 101 | Ht 63.0 in | Wt 268.0 lb

## 2020-05-28 DIAGNOSIS — Z6841 Body Mass Index (BMI) 40.0 and over, adult: Secondary | ICD-10-CM

## 2020-05-28 DIAGNOSIS — E559 Vitamin D deficiency, unspecified: Secondary | ICD-10-CM | POA: Diagnosis not present

## 2020-05-28 DIAGNOSIS — M25562 Pain in left knee: Secondary | ICD-10-CM | POA: Diagnosis not present

## 2020-05-28 DIAGNOSIS — R7303 Prediabetes: Secondary | ICD-10-CM

## 2020-05-28 MED ORDER — VITAMIN D (ERGOCALCIFEROL) 1.25 MG (50000 UNIT) PO CAPS: 50000.0000 [IU] | ORAL_CAPSULE | ORAL | 0 refills | Status: DC

## 2020-05-28 NOTE — Patient Instructions (Addendum)
Thank you for coming in today.  Take the weight off the knee.  Pt should help with the walker.   Recheck with me in 1 month.   Vit D will help.

## 2020-05-29 ENCOUNTER — Telehealth: Payer: Self-pay | Admitting: Internal Medicine

## 2020-05-29 ENCOUNTER — Ambulatory Visit: Payer: Medicare HMO | Admitting: Physical Therapy

## 2020-05-29 DIAGNOSIS — S82143D Displaced bicondylar fracture of unspecified tibia, subsequent encounter for closed fracture with routine healing: Secondary | ICD-10-CM

## 2020-05-29 NOTE — Telephone Encounter (Signed)
Patient and patients father calling, states that the patient has a broken knee and wanted to be referred to an orthopedic specialist at [facility] for a second opinion.  Patients father also wanting to speak to someone to make sure this gets done as soon as possible.  Patients [phone]

## 2020-05-31 ENCOUNTER — Ambulatory Visit: Payer: Medicare HMO | Admitting: Physical Therapy

## 2020-06-01 ENCOUNTER — Other Ambulatory Visit (HOSPITAL_COMMUNITY)
Admission: AD | Admit: 2020-06-01 | Discharge: 2020-06-01 | Disposition: A | Discharge status: Home / Self Care | Payer: Medicare HMO | Attending: Psychiatry | Admitting: Psychiatry

## 2020-06-01 ENCOUNTER — Other Ambulatory Visit: Payer: Self-pay

## 2020-06-01 DIAGNOSIS — F259 Schizoaffective disorder, unspecified: Secondary | ICD-10-CM | POA: Insufficient documentation

## 2020-06-01 DIAGNOSIS — Z79899 Other long term (current) drug therapy: Secondary | ICD-10-CM | POA: Diagnosis not present

## 2020-06-01 LAB — CBC WITH DIFFERENTIAL/PLATELET
Abs Immature Granulocytes: 0.01 10*3/uL (ref 0.00–0.07)
Basophils Absolute: 0 10*3/uL (ref 0.0–0.1)
Basophils Relative: 0 %
Eosinophils Absolute: 0 10*3/uL (ref 0.0–0.5)
Eosinophils Relative: 0 %
HCT: 37.7 % (ref 36.0–46.0)
Hemoglobin: 12.6 g/dL (ref 12.0–15.0)
Immature Granulocytes: 0 %
Lymphocytes Relative: 44 %
Lymphs Abs: 3.7 10*3/uL (ref 0.7–4.0)
MCH: 29.4 pg (ref 26.0–34.0)
MCHC: 33.4 g/dL (ref 30.0–36.0)
MCV: 88.1 fL (ref 80.0–100.0)
Monocytes Absolute: 0.6 10*3/uL (ref 0.1–1.0)
Monocytes Relative: 7 %
Neutro Abs: 4.2 10*3/uL (ref 1.7–7.7)
Neutrophils Relative %: 49 %
Platelets: 382 10*3/uL (ref 150–400)
RBC: 4.28 MIL/uL (ref 3.87–5.11)
RDW: 15.3 % (ref 11.5–15.5)
WBC: 8.5 10*3/uL (ref 4.0–10.5)
nRBC: 0 % (ref 0.0–0.2)

## 2020-06-03 NOTE — Progress Notes (Signed)
Chief Complaint:   OBESITY Brenda Hernandez is here to discuss her progress with her obesity treatment plan along with follow-up of her obesity related diagnoses. Brenda Hernandez is on the Category 3 Plan and keeping a food journal and adhering to recommended goals of 400-500 calories and 40 grams of protein at supper daily and states she is following her eating plan approximately 0% of the time. Brenda Hernandez states she is doing 0 minutes 0 times per week.  Today's visit was #: 5 Starting weight: 287 lbs Starting date: 03/26/2020 Today's weight: 268 lbs Today's date: 05/28/2020 Total lbs lost to date: 21 Total lbs lost since last in-office visit: 11  Interim History: Brenda Hernandez reports that she has not followed the plan at all due to being bed ridden secondary to knee pain. She has mostly been eating eggs, chicken salad, or oatmeal but she did have spaghetti and hot dogs this past week. Her mom is prepping all of her meals.  Subjective:   1. Vitamin D deficiency Brenda Hernandez is on Vit D, and she is tolerating it well.  2. Pre-diabetes Brenda Hernandez is on metformin BID, and she tolerating it well. Last A1c was 5.8.  Assessment/Plan:   1. Vitamin D deficiency Low Vitamin D level contributes to fatigue and are associated with obesity, breast, and colon cancer. We will refill prescription Vitamin D for 1 month. Brenda Hernandez will follow-up for routine testing of Vitamin D, at least 2-3 times per year to avoid over-replacement.  - Vitamin D, Ergocalciferol, (DRISDOL) 1.25 MG (50000 UNIT) CAPS capsule; Take 1 capsule (50,000 Units total) by mouth every 7 (seven) days.  Dispense: 4 capsule; Refill: 0  2. Pre-diabetes Brenda Hernandez will continue metformin, and will continue to work on weight loss, exercise, and decreasing simple carbohydrates to help decrease the risk of diabetes.   3. Class 3 severe obesity with serious comorbidity and body mass index (BMI) of 45.0 to 49.9 in adult, unspecified obesity type ([place]) Brenda Hernandez is  currently in the action stage of change. As such, her goal is to continue with weight loss efforts. She has agreed to the Category 3 Plan and keeping a food journal and adhering to recommended goals of 400-500 calories and 40 grams of protein at supper daily.   Exercise goals: No exercise has been prescribed at this time.  Behavioral modification strategies: meal planning and cooking strategies and planning for success.  Brenda Hernandez has agreed to follow-up with our clinic in 2 weeks. She was informed of the importance of frequent follow-up visits to maximize her success with intensive lifestyle modifications for her multiple health conditions.   Objective:   Blood pressure 126/82, pulse (!) 107, temperature 98 F (36.7 C), height 5\' 3"  (1.6 m), weight 268 lb (121.6 kg), SpO2 97 %. Body mass index is 47.47 kg/m.  General: Cooperative, alert, well developed, in no acute distress. HEENT: Conjunctivae and lids unremarkable. Cardiovascular: Regular rhythm.  Lungs: Normal work of breathing. Neurologic: No focal deficits.   Lab Results  Component Value Date   CREATININE 0.85 03/26/2020   BUN 11 03/26/2020   NA 138 03/26/2020   K 4.2 03/26/2020   CL 99 03/26/2020   CO2 20 03/26/2020   Lab Results  Component Value Date   ALT 12 03/26/2020   AST 14 03/26/2020   ALKPHOS 72 03/26/2020   BILITOT <0.2 03/26/2020   Lab Results  Component Value Date   HGBA1C 5.8 (H) 03/26/2020   HGBA1C 5.4 02/03/2020   HGBA1C 5.5 07/29/2019  HGBA1C 5.5 12/31/2018   HGBA1C 5.4 10/29/2018   Lab Results  Component Value Date   INSULIN 24.7 03/26/2020   Lab Results  Component Value Date   TSH 1.980 03/26/2020   Lab Results  Component Value Date   CHOL 183 03/26/2020   HDL 74 03/26/2020   LDLCALC 92 03/26/2020   TRIG 95 03/26/2020   CHOLHDL 2.4 02/03/2020   Lab Results  Component Value Date   WBC 8.5 06/01/2020   HGB 12.6 06/01/2020   HCT 37.7 06/01/2020   MCV 88.1 06/01/2020   PLT 382  06/01/2020   No results found for: IRON, TIBC, FERRITIN  Obesity Behavioral Intervention:   Approximately 15 minutes were spent on the discussion below.  ASK: We discussed the diagnosis of obesity with Brenda Hernandez today and Brenda Hernandez agreed to give [place] permission to discuss obesity behavioral modification therapy today.  ASSESS: Brenda Hernandez has the diagnosis of obesity and her BMI today is 47.49. Brenda Hernandez is in the action stage of change.   ADVISE: Brenda Hernandez was educated on the multiple health risks of obesity as well as the benefit of weight loss to improve her health. She was advised of the need for long term treatment and the importance of lifestyle modifications to improve her current health and to decrease her risk of future health problems.  AGREE: Multiple dietary modification options and treatment options were discussed and Brenda Hernandez agreed to follow the recommendations documented in the above note.  ARRANGE: Brenda Hernandez was educated on the importance of frequent visits to treat obesity as outlined per CMS and USPSTF guidelines and agreed to schedule her next follow up appointment today.  Attestation Statements:   Reviewed by clinician on day of visit: allergies, medications, problem list, medical history, surgical history, family history, social history, and previous encounter notes.   Brenda Hernandez, am acting as transcriptionist for [employer], PA-C.  I have reviewed the above documentation for accuracy and completeness, and I agree with the above. Abby Potash, PA-C

## 2020-06-04 ENCOUNTER — Ambulatory Visit: Payer: Medicare HMO | Admitting: Physical Therapy

## 2020-06-04 ENCOUNTER — Encounter: Payer: Self-pay | Admitting: Physical Therapy

## 2020-06-04 ENCOUNTER — Other Ambulatory Visit: Payer: Self-pay

## 2020-06-04 DIAGNOSIS — M25562 Pain in left knee: Secondary | ICD-10-CM

## 2020-06-04 DIAGNOSIS — M25662 Stiffness of left knee, not elsewhere classified: Secondary | ICD-10-CM | POA: Diagnosis not present

## 2020-06-04 DIAGNOSIS — R600 Localized edema: Secondary | ICD-10-CM

## 2020-06-04 DIAGNOSIS — R262 Difficulty in walking, not elsewhere classified: Secondary | ICD-10-CM

## 2020-06-04 NOTE — Therapy (Signed)
[address] [place], [place], [address] Phone: [phone]   Fax:  [phone]  Physical Therapy Treatment  Patient Details  Name: Brenda Hernandez MRN: [id number] Date of Birth: Aug 03, 1975 Referring Provider (PT): Marikay Alar Date: 06/04/2020   PT End of Session - 06/04/20 1052    Visit Number 6    Number of Visits 12    Date for PT Re-Evaluation 06/14/20    Authorization Type [employer]    PT Start Time 1017    PT Stop Time 1100    PT Time Calculation (min) 43 min    Activity Tolerance Patient limited by pain    Behavior During Therapy Anxious           Past Medical History:  Diagnosis Date  . Abnormal pap   . Bipolar 1 disorder ([place])   . GERD (gastroesophageal reflux disease)   . Pre-diabetes   . Schizophrenia ([place])   . Swallowing difficulty     History reviewed. No pertinent surgical history.  There were no vitals filed for this visit.   Subjective Assessment - 06/04/20 1020    Subjective Knee is getting worst. Went to MD, Pt dad present reports MRI shown fracture in her femoral condyle    Currently in Pain? Yes    Pain Score 10-Worst pain ever    Pain Location Knee    Pain Orientation Left                             [facility] Adult PT Treatment/Exercise - 06/04/20 0001      Ambulation/Gait   Ambulation/Gait Yes    Ambulation/Gait Assistance 5: Supervision    Ambulation Distance (Feet) 40 Feet    Assistive device Rolling walker    Gait Pattern Step-to pattern;Decreased hip/knee flexion - left    Ambulation Surface Level;Indoor    Stairs Yes    Stairs Assistance 5: Supervision;4: Min guard    Stair Management Technique Two rails;Step to pattern    Number of Stairs 3    Height of Stairs 4    Gait Comments Decrease wt bearing in LLE                    PT Short Term Goals - 05/20/20 1330      PT SHORT TERM GOAL #1   Title independentwith initial HEP     Status Achieved             PT Long Term Goals - 05/20/20 1330      PT LONG TERM GOAL #1   Title understand RICE    Status Partially Met      PT LONG TERM GOAL #2   Title incresae left knee AROM to = the right    Status On-going      PT LONG TERM GOAL #3   Title go up and down stairs step over step    Status On-going      PT LONG TERM GOAL #4   Title decrease pain overall 50%    Status On-going                 Plan - 06/04/20 1052    Clinical Impression Statement Pt returns to therapy after seeing MD and having MRI. MRI shows Nondisplaced, nondepressed subchondral insufficiency fractures of  the medial and lateral femoral condyles and medial tibial plateau  with severe surrounding marrow  edema. MD has suggested gait training with RW and decrease weight bearing in the RLE. Pt instructed on 3 point step to gait pattern with RW to decrease Wt bearing in the RLE. Pt doe fatigue quick bearing he extra weight in her arms.    Personal Factors and Comorbidities Comorbidity 3+    Comorbidities bipolar, schizophrenia, GERD    Stability/Clinical Decision Making Evolving/Moderate complexity    Rehab Potential Good    PT Frequency 2x / week    PT Duration 8 weeks    PT Treatment/Interventions ADLs/Self Care Home Management;Electrical Stimulation;Cryotherapy;Ultrasound;Gait training;Balance training;Therapeutic exercise;Therapeutic activities;Functional mobility training;Stair training;Patient/family education;Manual techniques;Vasopneumatic Device    PT Next Visit Plan reinforce decrease weightbearing in LLE.           Patient will benefit from skilled therapeutic intervention in order to improve the following deficits and impairments:  Abnormal gait,Decreased range of motion,Difficulty walking,Cardiopulmonary status limiting activity,Decreased activity tolerance,Pain,Decreased balance,Decreased mobility,Decreased strength,Increased edema  Visit Diagnosis: Stiffness of left  knee, not elsewhere classified  Localized edema  Acute pain of left knee  Difficulty in walking, not elsewhere classified     Problem List Patient Active Problem List   Diagnosis Date Noted  . Morbid obesity with BMI of 50.0-59.9, adult ([place]) 04/28/2020  . Knee pain, left 04/23/2020  . Diabetes mellitus ([place]) 02/19/2020  . Schizoaffective disorder ([place]) 02/19/2020  . Vitamin D deficiency 02/19/2020  . Low serum vitamin B12 02/19/2020  . Low back pain 02/19/2020  . Knee pain, right 02/19/2020  . BIPOLAR AFFECTIVE DISORDER 03/22/2009  . GERD 03/22/2009    Scot Jun 06/04/2020, 10:59 AM  [address] [place], [place], [address] Phone: [phone]   Fax:  [phone]  Name: Brenda Hernandez MRN: [id number] Date of Birth: [birth year]

## 2020-06-04 NOTE — Telephone Encounter (Signed)
Brenda Hernandez has a follow-up appointment Dr. Georgina Snell on 4/18.  Does she want to wait? Thanks

## 2020-06-04 NOTE — Telephone Encounter (Signed)
  Patient does not want to wait to see Dr Georgina Snell. Requesting referral asap to [facility]ist. Patients father states patient is in severe pain, unable to go to physical therapy due to pain and no pain medication. He states patient literally crawls on the floor to get around home. Requesting  order for bariatric walker.

## 2020-06-05 NOTE — Telephone Encounter (Signed)
OK - will ref to [facility] Done Thx

## 2020-06-05 NOTE — Addendum Note (Signed)
Addended by: Cassandria Anger on: 06/05/2020 01:20 PM   Modules accepted: Orders

## 2020-06-05 NOTE — Telephone Encounter (Signed)
Patient checking on the status of the walker

## 2020-06-06 ENCOUNTER — Ambulatory Visit: Payer: Medicare HMO | Admitting: Physical Therapy

## 2020-06-07 NOTE — Telephone Encounter (Signed)
Ok walker and ok bariatric w/c Thx

## 2020-06-07 NOTE — Addendum Note (Signed)
Addended by: Earnstine Regal on: 06/07/2020 03:47 PM   Modules accepted: Orders

## 2020-06-07 NOTE — Telephone Encounter (Signed)
Generated DME for bariatric walker. Notified pt dad order faxed to [employer] [facility].Marland KitchenJohny Chess

## 2020-06-11 DIAGNOSIS — M25562 Pain in left knee: Secondary | ICD-10-CM | POA: Diagnosis not present

## 2020-06-11 DIAGNOSIS — M79605 Pain in left leg: Secondary | ICD-10-CM | POA: Diagnosis not present

## 2020-06-11 DIAGNOSIS — M79604 Pain in right leg: Secondary | ICD-10-CM | POA: Diagnosis not present

## 2020-06-11 DIAGNOSIS — M84352G Stress fracture, left femur, subsequent encounter for fracture with delayed healing: Secondary | ICD-10-CM | POA: Diagnosis not present

## 2020-06-12 ENCOUNTER — Ambulatory Visit: Payer: Medicare HMO | Admitting: Physical Therapy

## 2020-06-12 ENCOUNTER — Ambulatory Visit (INDEPENDENT_AMBULATORY_CARE_PROVIDER_SITE_OTHER): Payer: Medicare HMO | Admitting: Physician Assistant

## 2020-06-12 ENCOUNTER — Encounter (HOSPITAL_COMMUNITY): Payer: Medicare HMO

## 2020-06-12 ENCOUNTER — Ambulatory Visit (HOSPITAL_COMMUNITY)
Admission: RE | Admit: 2020-06-12 | Discharge: 2020-06-12 | Disposition: A | Discharge status: Home / Self Care | Payer: Medicare HMO | Source: Ambulatory Visit | Attending: Orthopedic Surgery | Admitting: Orthopedic Surgery

## 2020-06-12 ENCOUNTER — Encounter (INDEPENDENT_AMBULATORY_CARE_PROVIDER_SITE_OTHER): Payer: Self-pay | Admitting: Physician Assistant

## 2020-06-12 ENCOUNTER — Other Ambulatory Visit: Payer: Self-pay

## 2020-06-12 ENCOUNTER — Other Ambulatory Visit (HOSPITAL_COMMUNITY): Payer: Self-pay | Admitting: Orthopedic Surgery

## 2020-06-12 VITALS — BP 114/76 | HR 105 | Temp 97.9°F | Ht 63.0 in | Wt 267.0 lb

## 2020-06-12 DIAGNOSIS — M79604 Pain in right leg: Secondary | ICD-10-CM

## 2020-06-12 DIAGNOSIS — M79605 Pain in left leg: Secondary | ICD-10-CM | POA: Diagnosis not present

## 2020-06-12 DIAGNOSIS — Z6841 Body Mass Index (BMI) 40.0 and over, adult: Secondary | ICD-10-CM | POA: Diagnosis not present

## 2020-06-12 DIAGNOSIS — E559 Vitamin D deficiency, unspecified: Secondary | ICD-10-CM | POA: Diagnosis not present

## 2020-06-12 MED ORDER — VITAMIN D (ERGOCALCIFEROL) 1.25 MG (50000 UNIT) PO CAPS
50000.0000 [IU] | ORAL_CAPSULE | ORAL | 0 refills | Status: DC
Start: 2020-06-12 — End: 2020-06-27

## 2020-06-14 ENCOUNTER — Ambulatory Visit: Payer: Medicare HMO | Admitting: Physical Therapy

## 2020-06-14 DIAGNOSIS — F319 Bipolar disorder, unspecified: Secondary | ICD-10-CM | POA: Diagnosis not present

## 2020-06-14 DIAGNOSIS — Z7984 Long term (current) use of oral hypoglycemic drugs: Secondary | ICD-10-CM | POA: Diagnosis not present

## 2020-06-14 DIAGNOSIS — S8992XD Unspecified injury of left lower leg, subsequent encounter: Secondary | ICD-10-CM | POA: Diagnosis not present

## 2020-06-14 DIAGNOSIS — K219 Gastro-esophageal reflux disease without esophagitis: Secondary | ICD-10-CM | POA: Diagnosis not present

## 2020-06-14 DIAGNOSIS — Z9181 History of falling: Secondary | ICD-10-CM | POA: Diagnosis not present

## 2020-06-14 DIAGNOSIS — Z7982 Long term (current) use of aspirin: Secondary | ICD-10-CM | POA: Diagnosis not present

## 2020-06-14 DIAGNOSIS — E119 Type 2 diabetes mellitus without complications: Secondary | ICD-10-CM | POA: Diagnosis not present

## 2020-06-14 DIAGNOSIS — M84352G Stress fracture, left femur, subsequent encounter for fracture with delayed healing: Secondary | ICD-10-CM | POA: Diagnosis not present

## 2020-06-18 DIAGNOSIS — M84352G Stress fracture, left femur, subsequent encounter for fracture with delayed healing: Secondary | ICD-10-CM | POA: Diagnosis not present

## 2020-06-18 DIAGNOSIS — E119 Type 2 diabetes mellitus without complications: Secondary | ICD-10-CM | POA: Diagnosis not present

## 2020-06-18 DIAGNOSIS — K219 Gastro-esophageal reflux disease without esophagitis: Secondary | ICD-10-CM | POA: Diagnosis not present

## 2020-06-18 DIAGNOSIS — Z7984 Long term (current) use of oral hypoglycemic drugs: Secondary | ICD-10-CM | POA: Diagnosis not present

## 2020-06-18 DIAGNOSIS — Z9181 History of falling: Secondary | ICD-10-CM | POA: Diagnosis not present

## 2020-06-18 DIAGNOSIS — F319 Bipolar disorder, unspecified: Secondary | ICD-10-CM | POA: Diagnosis not present

## 2020-06-18 DIAGNOSIS — S8992XD Unspecified injury of left lower leg, subsequent encounter: Secondary | ICD-10-CM | POA: Diagnosis not present

## 2020-06-18 DIAGNOSIS — Z7982 Long term (current) use of aspirin: Secondary | ICD-10-CM | POA: Diagnosis not present

## 2020-06-18 NOTE — Progress Notes (Signed)
Chief Complaint:   OBESITY Brenda Hernandez is here to discuss her progress with her obesity treatment plan along with follow-up of her obesity related diagnoses. Brenda Hernandez is on the Category 3 Plan and keeping a food journal and adhering to recommended goals of 400-500 calories and 40 grams of protein at supper daily and states she is following her eating plan approximately 0% of the time. Brenda Hernandez states she is doing 0 minutes 0 times per week.  Today's visit was #: 6 Starting weight: 287 lbs Starting date: 03/26/2020 Today's weight: 267 lbs Today's date: 06/12/2020 Total lbs lost to date: 20 Total lbs lost since last in-office visit: 1  Interim History: Brenda Hernandez reports that she hasn't been following the plan since Ortho put her in a wheelchair due to knee pain. She is eating a lot of carbohydrates and not enough protein. She is going to stay with her dad for a few days.  Subjective:   1. Vitamin D deficiency Brenda Hernandez is on Vit D weekly, and she is tolerating it well.  Assessment/Plan:   1. Vitamin D deficiency Low Vitamin D level contributes to fatigue and are associated with obesity, breast, and colon cancer. We will refill prescription Vitamin D for 1 month. Brenda Hernandez will follow-up for routine testing of Vitamin D, at least 2-3 times per year to avoid over-replacement.  - Vitamin D, Ergocalciferol, (DRISDOL) 1.25 MG (50000 UNIT) CAPS capsule; Take 1 capsule (50,000 Units total) by mouth every 7 (seven) days.  Dispense: 4 capsule; Refill: 0  2. Class 3 severe obesity with serious comorbidity and body mass index (BMI) of 45.0 to 49.9 in adult, unspecified obesity type (Brenda Hernandez) Brenda Hernandez is currently in the action stage of change. As such, her goal is to continue with weight loss efforts. She has agreed to the Category 3 Plan.   Exercise goals: No exercise has been prescribed at this time.  Behavioral modification strategies: meal planning and cooking strategies and keeping healthy foods in the  home.  Brenda Hernandez has agreed to follow-up with our clinic in 2 weeks. She was informed of the importance of frequent follow-up visits to maximize her success with intensive lifestyle modifications for her multiple health conditions.   Objective:   Blood pressure 114/76, pulse (!) 105, temperature 97.9 F (36.6 C), temperature source Oral, height 5\' 3"  (1.6 m), weight 267 lb (121.1 kg), SpO2 97 %. Body mass index is 47.3 kg/m.  General: Cooperative, alert, well developed, in no acute distress. HEENT: Conjunctivae and lids unremarkable. Cardiovascular: Regular rhythm.  Lungs: Normal work of breathing. Neurologic: No focal deficits.   Lab Results  Component Value Date   CREATININE 0.85 03/26/2020   BUN 11 03/26/2020   NA 138 03/26/2020   K 4.2 03/26/2020   CL 99 03/26/2020   CO2 20 03/26/2020   Lab Results  Component Value Date   ALT 12 03/26/2020   AST 14 03/26/2020   ALKPHOS 72 03/26/2020   BILITOT <0.2 03/26/2020   Lab Results  Component Value Date   HGBA1C 5.8 (H) 03/26/2020   HGBA1C 5.4 02/03/2020   HGBA1C 5.5 07/29/2019   HGBA1C 5.5 12/31/2018   HGBA1C 5.4 10/29/2018   Lab Results  Component Value Date   INSULIN 24.7 03/26/2020   Lab Results  Component Value Date   TSH 1.980 03/26/2020   Lab Results  Component Value Date   CHOL 183 03/26/2020   HDL 74 03/26/2020   LDLCALC 92 03/26/2020   TRIG 95 03/26/2020   CHOLHDL  2.4 02/03/2020   Lab Results  Component Value Date   WBC 8.5 06/01/2020   HGB 12.6 06/01/2020   HCT 37.7 06/01/2020   MCV 88.1 06/01/2020   PLT 382 06/01/2020   No results found for: IRON, TIBC, FERRITIN  Obesity Behavioral Intervention:   Approximately 15 minutes were spent on the discussion below.  ASK: We discussed the diagnosis of obesity with Brenda Hernandez today and Brenda Hernandez agreed to give [place] permission to discuss obesity behavioral modification therapy today.  ASSESS: Brenda Hernandez has the diagnosis of obesity and her BMI today is 47.31.  Brenda Hernandez is in the action stage of change.   ADVISE: Brenda Hernandez was educated on the multiple health risks of obesity as well as the benefit of weight loss to improve her health. She was advised of the need for long term treatment and the importance of lifestyle modifications to improve her current health and to decrease her risk of future health problems.  AGREE: Multiple dietary modification options and treatment options were discussed and Brenda Hernandez agreed to follow the recommendations documented in the above note.  ARRANGE: Brenda Hernandez was educated on the importance of frequent visits to treat obesity as outlined per CMS and USPSTF guidelines and agreed to schedule her next follow up appointment today.  Attestation Statements:   Reviewed by clinician on day of visit: allergies, medications, problem list, medical history, surgical history, family history, social history, and previous encounter notes.   [name], am acting as transcriptionist for [employer], PA-C.  I have reviewed the above documentation for accuracy and completeness, and I agree with the above. Abby Potash, PA-C

## 2020-06-19 DIAGNOSIS — Z7982 Long term (current) use of aspirin: Secondary | ICD-10-CM | POA: Diagnosis not present

## 2020-06-19 DIAGNOSIS — Z9181 History of falling: Secondary | ICD-10-CM | POA: Diagnosis not present

## 2020-06-19 DIAGNOSIS — E119 Type 2 diabetes mellitus without complications: Secondary | ICD-10-CM | POA: Diagnosis not present

## 2020-06-19 DIAGNOSIS — S8992XD Unspecified injury of left lower leg, subsequent encounter: Secondary | ICD-10-CM | POA: Diagnosis not present

## 2020-06-19 DIAGNOSIS — K219 Gastro-esophageal reflux disease without esophagitis: Secondary | ICD-10-CM | POA: Diagnosis not present

## 2020-06-19 DIAGNOSIS — Z7984 Long term (current) use of oral hypoglycemic drugs: Secondary | ICD-10-CM | POA: Diagnosis not present

## 2020-06-19 DIAGNOSIS — F319 Bipolar disorder, unspecified: Secondary | ICD-10-CM | POA: Diagnosis not present

## 2020-06-19 DIAGNOSIS — M84352G Stress fracture, left femur, subsequent encounter for fracture with delayed healing: Secondary | ICD-10-CM | POA: Diagnosis not present

## 2020-06-26 ENCOUNTER — Ambulatory Visit (INDEPENDENT_AMBULATORY_CARE_PROVIDER_SITE_OTHER): Payer: Medicare HMO | Admitting: Physician Assistant

## 2020-06-27 ENCOUNTER — Encounter (INDEPENDENT_AMBULATORY_CARE_PROVIDER_SITE_OTHER): Payer: Self-pay | Admitting: Physician Assistant

## 2020-06-27 ENCOUNTER — Other Ambulatory Visit: Payer: Self-pay

## 2020-06-27 ENCOUNTER — Ambulatory Visit (INDEPENDENT_AMBULATORY_CARE_PROVIDER_SITE_OTHER): Payer: Medicare HMO | Admitting: Physician Assistant

## 2020-06-27 VITALS — BP 127/80 | HR 100 | Temp 98.3°F | Ht 63.0 in | Wt 258.0 lb

## 2020-06-27 DIAGNOSIS — Z9181 History of falling: Secondary | ICD-10-CM | POA: Diagnosis not present

## 2020-06-27 DIAGNOSIS — R7303 Prediabetes: Secondary | ICD-10-CM | POA: Diagnosis not present

## 2020-06-27 DIAGNOSIS — S8992XD Unspecified injury of left lower leg, subsequent encounter: Secondary | ICD-10-CM | POA: Diagnosis not present

## 2020-06-27 DIAGNOSIS — E559 Vitamin D deficiency, unspecified: Secondary | ICD-10-CM | POA: Diagnosis not present

## 2020-06-27 DIAGNOSIS — M84352G Stress fracture, left femur, subsequent encounter for fracture with delayed healing: Secondary | ICD-10-CM | POA: Diagnosis not present

## 2020-06-27 DIAGNOSIS — F319 Bipolar disorder, unspecified: Secondary | ICD-10-CM | POA: Diagnosis not present

## 2020-06-27 DIAGNOSIS — Z6841 Body Mass Index (BMI) 40.0 and over, adult: Secondary | ICD-10-CM | POA: Diagnosis not present

## 2020-06-27 DIAGNOSIS — K219 Gastro-esophageal reflux disease without esophagitis: Secondary | ICD-10-CM | POA: Diagnosis not present

## 2020-06-27 DIAGNOSIS — E119 Type 2 diabetes mellitus without complications: Secondary | ICD-10-CM | POA: Diagnosis not present

## 2020-06-27 DIAGNOSIS — Z7984 Long term (current) use of oral hypoglycemic drugs: Secondary | ICD-10-CM | POA: Diagnosis not present

## 2020-06-27 DIAGNOSIS — Z7982 Long term (current) use of aspirin: Secondary | ICD-10-CM | POA: Diagnosis not present

## 2020-06-27 MED ORDER — VITAMIN D (ERGOCALCIFEROL) 1.25 MG (50000 UNIT) PO CAPS
50000.0000 [IU] | ORAL_CAPSULE | ORAL | 0 refills | Status: DC
Start: 2020-06-27 — End: 2020-07-18

## 2020-06-29 ENCOUNTER — Other Ambulatory Visit (HOSPITAL_COMMUNITY)
Admission: AD | Admit: 2020-06-29 | Discharge: 2020-06-29 | Disposition: A | Discharge status: Home / Self Care | Payer: Medicare HMO | Source: Ambulatory Visit | Attending: Psychiatry | Admitting: Psychiatry

## 2020-06-29 DIAGNOSIS — Z79899 Other long term (current) drug therapy: Secondary | ICD-10-CM | POA: Diagnosis not present

## 2020-06-29 DIAGNOSIS — F259 Schizoaffective disorder, unspecified: Secondary | ICD-10-CM | POA: Insufficient documentation

## 2020-06-29 LAB — CBC
HCT: 41.1 % (ref 36.0–46.0)
Hemoglobin: 13.6 g/dL (ref 12.0–15.0)
MCH: 29 pg (ref 26.0–34.0)
MCHC: 33.1 g/dL (ref 30.0–36.0)
MCV: 87.6 fL (ref 80.0–100.0)
Platelets: 411 10*3/uL — ABNORMAL HIGH (ref 150–400)
RBC: 4.69 MIL/uL (ref 3.87–5.11)
RDW: 15.2 % (ref 11.5–15.5)
WBC: 8.4 10*3/uL (ref 4.0–10.5)
nRBC: 0 % (ref 0.0–0.2)

## 2020-07-01 ENCOUNTER — Ambulatory Visit: Payer: Medicare HMO | Admitting: Family Medicine

## 2020-07-01 NOTE — Progress Notes (Signed)
Chief Complaint:   OBESITY Brenda Hernandez is here to discuss her progress with her obesity treatment plan along with follow-up of her obesity related diagnoses. Brenda Hernandez is on the Category 3 Plan and states she is following her eating plan approximately 50% of the time. Brenda Hernandez states she is not currently exercising.  Today's visit was #: 7 Starting weight: 287 lbs Starting date: 03/26/2020 Today's weight: 258 lbs Today's date: 06/27/2020 Total lbs lost to date: 29 Total lbs lost since last in-office visit: 9  Interim History: Brenda Hernandez has been staying with her dad, who has been helping take care of her. She has been eating a lot of salads and chicken. She is asking about healthy cereal and oatmeal options.  Subjective:   1. Vitamin D deficiency Brenda Hernandez is on prescription Vit D weekly.  2. Pre-diabetes Brenda Hernandez's last A1c 5.8. She is on Metformin BID and tolerating it well.  Assessment/Plan:   1. Vitamin D deficiency Low Vitamin D level contributes to fatigue and are associated with obesity, breast, and colon cancer. She agrees to continue to take prescription Vitamin D @50 ,000 IU every week and will follow-up for routine testing of Vitamin D, at least 2-3 times per year to avoid over-replacement.  - Vitamin D, Ergocalciferol, (DRISDOL) 1.25 MG (50000 UNIT) CAPS capsule; Take 1 capsule (50,000 Units total) by mouth every 7 (seven) days.  Dispense: 4 capsule; Refill: 0  2. Pre-diabetes Brenda Hernandez will continue to work on weight loss, exercise, and decreasing simple carbohydrates to help decrease the risk of diabetes. Continue Metformin.  3. Class 3 severe obesity with serious comorbidity and body mass index (BMI) of 45.0 to 49.9 in adult, unspecified obesity type (Brenda Hernandez) Brenda Hernandez is currently in the action stage of change. As such, her goal is to continue with weight loss efforts. She has agreed to the Category 3 Plan.   Fasting labs at next visit.  Exercise goals: No exercise has been  prescribed at this time.  Behavioral modification strategies: meal planning and cooking strategies and keeping healthy foods in the home.  Brenda Hernandez has agreed to follow-up with our clinic in 3 weeks. She was informed of the importance of frequent follow-up visits to maximize her success with intensive lifestyle modifications for her multiple health conditions.   Objective:   Blood pressure 127/80, pulse 100, temperature 98.3 F (36.8 C), height 5\' 3"  (1.6 m), weight 258 lb (117 kg), SpO2 99 %. Body mass index is 45.7 kg/m.  General: Cooperative, alert, well developed, in no acute distress. HEENT: Conjunctivae and lids unremarkable. Cardiovascular: Regular rhythm.  Lungs: Normal work of breathing. Neurologic: No focal deficits.   Lab Results  Component Value Date   CREATININE 0.85 03/26/2020   BUN 11 03/26/2020   NA 138 03/26/2020   K 4.2 03/26/2020   CL 99 03/26/2020   CO2 20 03/26/2020   Lab Results  Component Value Date   ALT 12 03/26/2020   AST 14 03/26/2020   ALKPHOS 72 03/26/2020   BILITOT <0.2 03/26/2020   Lab Results  Component Value Date   HGBA1C 5.8 (H) 03/26/2020   HGBA1C 5.4 02/03/2020   HGBA1C 5.5 07/29/2019   HGBA1C 5.5 12/31/2018   HGBA1C 5.4 10/29/2018   Lab Results  Component Value Date   INSULIN 24.7 03/26/2020   Lab Results  Component Value Date   TSH 1.980 03/26/2020   Lab Results  Component Value Date   CHOL 183 03/26/2020   HDL 74 03/26/2020   LDLCALC 92 03/26/2020  TRIG 95 03/26/2020   CHOLHDL 2.4 02/03/2020   Lab Results  Component Value Date   WBC 8.4 06/29/2020   HGB 13.6 06/29/2020   HCT 41.1 06/29/2020   MCV 87.6 06/29/2020   PLT 411 (H) 06/29/2020   No results found for: IRON, TIBC, FERRITIN  Obesity Behavioral Intervention:   Approximately 15 minutes were spent on the discussion below.  ASK: We discussed the diagnosis of obesity with Brenda Hernandez today and Brenda Hernandez agreed to give [place] permission to discuss obesity  behavioral modification therapy today.  ASSESS: Brenda Hernandez has the diagnosis of obesity and her BMI today is 45.8. Brenda Hernandez is in the action stage of change.   ADVISE: Brenda Hernandez was educated on the multiple health risks of obesity as well as the benefit of weight loss to improve her health. She was advised of the need for long term treatment and the importance of lifestyle modifications to improve her current health and to decrease her risk of future health problems.  AGREE: Multiple dietary modification options and treatment options were discussed and Brenda Hernandez agreed to follow the recommendations documented in the above note.  ARRANGE: Brenda Hernandez was educated on the importance of frequent visits to treat obesity as outlined per CMS and USPSTF guidelines and agreed to schedule her next follow up appointment today.  Attestation Statements:   Reviewed by clinician on day of visit: allergies, medications, problem list, medical history, surgical history, family history, social history, and previous encounter notes.  Coral Ceo, am acting as Location manager for [employer], PA-C.  I have reviewed the above documentation for accuracy and completeness, and I agree with the above. Abby Potash, PA-C

## 2020-07-03 DIAGNOSIS — F319 Bipolar disorder, unspecified: Secondary | ICD-10-CM | POA: Diagnosis not present

## 2020-07-03 DIAGNOSIS — Z9181 History of falling: Secondary | ICD-10-CM | POA: Diagnosis not present

## 2020-07-03 DIAGNOSIS — K219 Gastro-esophageal reflux disease without esophagitis: Secondary | ICD-10-CM | POA: Diagnosis not present

## 2020-07-03 DIAGNOSIS — Z7984 Long term (current) use of oral hypoglycemic drugs: Secondary | ICD-10-CM | POA: Diagnosis not present

## 2020-07-03 DIAGNOSIS — E119 Type 2 diabetes mellitus without complications: Secondary | ICD-10-CM | POA: Diagnosis not present

## 2020-07-03 DIAGNOSIS — M84352G Stress fracture, left femur, subsequent encounter for fracture with delayed healing: Secondary | ICD-10-CM | POA: Diagnosis not present

## 2020-07-03 DIAGNOSIS — Z7982 Long term (current) use of aspirin: Secondary | ICD-10-CM | POA: Diagnosis not present

## 2020-07-03 DIAGNOSIS — S8992XD Unspecified injury of left lower leg, subsequent encounter: Secondary | ICD-10-CM | POA: Diagnosis not present

## 2020-07-08 DIAGNOSIS — Z7984 Long term (current) use of oral hypoglycemic drugs: Secondary | ICD-10-CM | POA: Diagnosis not present

## 2020-07-08 DIAGNOSIS — K219 Gastro-esophageal reflux disease without esophagitis: Secondary | ICD-10-CM | POA: Diagnosis not present

## 2020-07-08 DIAGNOSIS — M84352G Stress fracture, left femur, subsequent encounter for fracture with delayed healing: Secondary | ICD-10-CM | POA: Diagnosis not present

## 2020-07-08 DIAGNOSIS — S8992XD Unspecified injury of left lower leg, subsequent encounter: Secondary | ICD-10-CM | POA: Diagnosis not present

## 2020-07-08 DIAGNOSIS — E119 Type 2 diabetes mellitus without complications: Secondary | ICD-10-CM | POA: Diagnosis not present

## 2020-07-08 DIAGNOSIS — Z7982 Long term (current) use of aspirin: Secondary | ICD-10-CM | POA: Diagnosis not present

## 2020-07-08 DIAGNOSIS — Z9181 History of falling: Secondary | ICD-10-CM | POA: Diagnosis not present

## 2020-07-08 DIAGNOSIS — F319 Bipolar disorder, unspecified: Secondary | ICD-10-CM | POA: Diagnosis not present

## 2020-07-09 DIAGNOSIS — M79605 Pain in left leg: Secondary | ICD-10-CM | POA: Diagnosis not present

## 2020-07-09 DIAGNOSIS — M79604 Pain in right leg: Secondary | ICD-10-CM | POA: Diagnosis not present

## 2020-07-09 DIAGNOSIS — M25562 Pain in left knee: Secondary | ICD-10-CM | POA: Diagnosis not present

## 2020-07-12 DIAGNOSIS — M79605 Pain in left leg: Secondary | ICD-10-CM | POA: Diagnosis not present

## 2020-07-12 DIAGNOSIS — M79604 Pain in right leg: Secondary | ICD-10-CM | POA: Diagnosis not present

## 2020-07-14 DIAGNOSIS — Z7984 Long term (current) use of oral hypoglycemic drugs: Secondary | ICD-10-CM | POA: Diagnosis not present

## 2020-07-14 DIAGNOSIS — K219 Gastro-esophageal reflux disease without esophagitis: Secondary | ICD-10-CM | POA: Diagnosis not present

## 2020-07-14 DIAGNOSIS — F319 Bipolar disorder, unspecified: Secondary | ICD-10-CM | POA: Diagnosis not present

## 2020-07-14 DIAGNOSIS — S8992XD Unspecified injury of left lower leg, subsequent encounter: Secondary | ICD-10-CM | POA: Diagnosis not present

## 2020-07-14 DIAGNOSIS — Z7982 Long term (current) use of aspirin: Secondary | ICD-10-CM | POA: Diagnosis not present

## 2020-07-14 DIAGNOSIS — M84352G Stress fracture, left femur, subsequent encounter for fracture with delayed healing: Secondary | ICD-10-CM | POA: Diagnosis not present

## 2020-07-14 DIAGNOSIS — Z9181 History of falling: Secondary | ICD-10-CM | POA: Diagnosis not present

## 2020-07-14 DIAGNOSIS — E119 Type 2 diabetes mellitus without complications: Secondary | ICD-10-CM | POA: Diagnosis not present

## 2020-07-15 DIAGNOSIS — Z9181 History of falling: Secondary | ICD-10-CM | POA: Diagnosis not present

## 2020-07-15 DIAGNOSIS — Z7984 Long term (current) use of oral hypoglycemic drugs: Secondary | ICD-10-CM | POA: Diagnosis not present

## 2020-07-15 DIAGNOSIS — E119 Type 2 diabetes mellitus without complications: Secondary | ICD-10-CM | POA: Diagnosis not present

## 2020-07-15 DIAGNOSIS — S8992XD Unspecified injury of left lower leg, subsequent encounter: Secondary | ICD-10-CM | POA: Diagnosis not present

## 2020-07-15 DIAGNOSIS — M84352G Stress fracture, left femur, subsequent encounter for fracture with delayed healing: Secondary | ICD-10-CM | POA: Diagnosis not present

## 2020-07-15 DIAGNOSIS — K219 Gastro-esophageal reflux disease without esophagitis: Secondary | ICD-10-CM | POA: Diagnosis not present

## 2020-07-15 DIAGNOSIS — Z7982 Long term (current) use of aspirin: Secondary | ICD-10-CM | POA: Diagnosis not present

## 2020-07-15 DIAGNOSIS — F319 Bipolar disorder, unspecified: Secondary | ICD-10-CM | POA: Diagnosis not present

## 2020-07-18 ENCOUNTER — Encounter (INDEPENDENT_AMBULATORY_CARE_PROVIDER_SITE_OTHER): Payer: Self-pay | Admitting: Physician Assistant

## 2020-07-18 ENCOUNTER — Other Ambulatory Visit: Payer: Self-pay

## 2020-07-18 ENCOUNTER — Ambulatory Visit (INDEPENDENT_AMBULATORY_CARE_PROVIDER_SITE_OTHER): Payer: Medicare HMO | Admitting: Physician Assistant

## 2020-07-18 VITALS — BP 128/80 | HR 86 | Temp 97.5°F | Ht 63.0 in | Wt 259.0 lb

## 2020-07-18 DIAGNOSIS — E559 Vitamin D deficiency, unspecified: Secondary | ICD-10-CM

## 2020-07-18 DIAGNOSIS — Z6841 Body Mass Index (BMI) 40.0 and over, adult: Secondary | ICD-10-CM

## 2020-07-18 DIAGNOSIS — E785 Hyperlipidemia, unspecified: Secondary | ICD-10-CM

## 2020-07-18 DIAGNOSIS — R7303 Prediabetes: Secondary | ICD-10-CM

## 2020-07-18 MED ORDER — VITAMIN D (ERGOCALCIFEROL) 1.25 MG (50000 UNIT) PO CAPS: 50000.0000 [IU] | ORAL_CAPSULE | ORAL | 0 refills | Status: DC

## 2020-07-18 NOTE — Progress Notes (Signed)
Chief Complaint:   OBESITY Brenda Hernandez is here to discuss her progress with her obesity treatment plan along with follow-up of her obesity related diagnoses. Brenda Hernandez is on the Category 3 Plan and states she is following her eating plan approximately 50% of the time. [name] states she is doing 0 minutes 0 times per week.  Today's visit was #: 8 Starting weight: 287 lbs Starting date: 03/26/2020 Today's weight: 259 lbs Today's date: 07/18/2020 Total lbs lost to date: 28 Total lbs lost since last in-office visit: 0  Interim History: [name] reports that she became constipated and she cut out eggs and broccoli because she read on the internet that it would help. She also ate out more often.  Subjective:   1. Vitamin D deficiency Brenda Hernandez is on Vit D, and she denies nausea, vomiting, or muscle weakness.  2. Dyslipidemia Brenda Hernandez is not on medications. Last lipid panel was in normal ranges.  3. Pre-diabetes Brenda Hernandez is on metformin, and she denies nausea, vomiting, or diarrhea.  Assessment/Plan:   1. Vitamin D deficiency Low Vitamin D level contributes to fatigue and are associated with obesity, breast, and colon cancer. We will check labs today, and we will refill prescription Vitamin D for 1 month. Brenda Hernandez will follow-up for routine testing of Vitamin D, at least 2-3 times per year to avoid over-replacement.  - Vitamin D, Ergocalciferol, (DRISDOL) 1.25 MG (50000 UNIT) CAPS capsule; Take 1 capsule (50,000 Units total) by mouth every 7 (seven) days.  Dispense: 4 capsule; Refill: 0 - VITAMIN D 25 Hydroxy (Vit-D Deficiency, Fractures)  2. Dyslipidemia Cardiovascular risk and specific lipid/LDL goals reviewed. We discussed several lifestyle modifications today. We will check labs today. [name] will continue to work on diet, exercise and weight loss efforts. Orders and follow up as documented in patient record.   Counseling Intensive lifestyle modifications are the first line treatment  for this issue. . Dietary changes: Increase soluble fiber. Decrease simple carbohydrates. . Exercise changes: Moderate to vigorous-intensity aerobic activity 150 minutes per week if tolerated. . Lipid-lowering medications: see documented in medical record.  - Lipid panel  3. Pre-diabetes Brenda Hernandez will continue metformin, and will continue to work on weight loss, exercise, and decreasing simple carbohydrates to help decrease the risk of diabetes. We will check labs today.  - Comprehensive metabolic panel - Hemoglobin A1c - Insulin, random  4. Class 3 severe obesity with serious comorbidity and body mass index (BMI) of 45.0 to 49.9 in adult, unspecified obesity type ([place]) Brenda Hernandez is currently in the action stage of change. As such, her goal is to continue with weight loss efforts. She has agreed to the Category 3 Plan.   Exercise goals: No exercise has been prescribed at this time.  Behavioral modification strategies: decreasing eating out and meal planning and cooking strategies.  Brenda Hernandez has agreed to follow-up with our clinic in 2 to 3 weeks. She was informed of the importance of frequent follow-up visits to maximize her success with intensive lifestyle modifications for her multiple health conditions.   Brenda Hernandez was informed we would discuss her lab results at her next visit unless there is a critical issue that needs to be addressed sooner. Brenda Hernandez agreed to keep her next visit at the agreed upon time to discuss these results.  Objective:   Blood pressure 128/80, pulse 86, temperature (!) 97.5 F (36.4 C), height 5\' 3"  (1.6 m), weight 259 lb (117.5 kg), last menstrual period 06/19/2020, SpO2 98 %. Body mass index is 45.88 kg/m.  General: Cooperative, alert, well developed, in no acute distress. HEENT: Conjunctivae and lids unremarkable. Cardiovascular: Regular rhythm.  Lungs: Normal work of breathing. Neurologic: No focal deficits.   Lab Results  Component Value Date    CREATININE 0.85 03/26/2020   BUN 11 03/26/2020   NA 138 03/26/2020   K 4.2 03/26/2020   CL 99 03/26/2020   CO2 20 03/26/2020   Lab Results  Component Value Date   ALT 12 03/26/2020   AST 14 03/26/2020   ALKPHOS 72 03/26/2020   BILITOT <0.2 03/26/2020   Lab Results  Component Value Date   HGBA1C 5.8 (H) 03/26/2020   HGBA1C 5.4 02/03/2020   HGBA1C 5.5 07/29/2019   HGBA1C 5.5 12/31/2018   HGBA1C 5.4 10/29/2018   Lab Results  Component Value Date   INSULIN 24.7 03/26/2020   Lab Results  Component Value Date   TSH 1.980 03/26/2020   Lab Results  Component Value Date   CHOL 183 03/26/2020   HDL 74 03/26/2020   LDLCALC 92 03/26/2020   TRIG 95 03/26/2020   CHOLHDL 2.4 02/03/2020   Lab Results  Component Value Date   WBC 8.4 06/29/2020   HGB 13.6 06/29/2020   HCT 41.1 06/29/2020   MCV 87.6 06/29/2020   PLT 411 (H) 06/29/2020   No results found for: IRON, TIBC, FERRITIN  Obesity Behavioral Intervention:   Approximately 15 minutes were spent on the discussion below.  ASK: We discussed the diagnosis of obesity with Brenda Hernandez today and Brenda Hernandez agreed to give [place] permission to discuss obesity behavioral modification therapy today.  ASSESS: Brenda Hernandez has the diagnosis of obesity and her BMI today is 45.89. Brenda Hernandez is in the action stage of change.   ADVISE: Brenda Hernandez was educated on the multiple health risks of obesity as well as the benefit of weight loss to improve her health. She was advised of the need for long term treatment and the importance of lifestyle modifications to improve her current health and to decrease her risk of future health problems.  AGREE: Multiple dietary modification options and treatment options were discussed and Brenda Hernandez agreed to follow the recommendations documented in the above note.  ARRANGE: Brenda Hernandez was educated on the importance of frequent visits to treat obesity as outlined per CMS and USPSTF guidelines and agreed to schedule her next  follow up appointment today.  Attestation Statements:   Reviewed by clinician on day of visit: allergies, medications, problem list, medical history, surgical history, family history, social history, and previous encounter notes.   Brenda Hernandez, am acting as transcriptionist for [employer], PA-C.  I have reviewed the above documentation for accuracy and completeness, and I agree with the above. Abby Potash, PA-C

## 2020-07-19 DIAGNOSIS — E119 Type 2 diabetes mellitus without complications: Secondary | ICD-10-CM | POA: Diagnosis not present

## 2020-07-19 DIAGNOSIS — M84352G Stress fracture, left femur, subsequent encounter for fracture with delayed healing: Secondary | ICD-10-CM | POA: Diagnosis not present

## 2020-07-19 DIAGNOSIS — Z7982 Long term (current) use of aspirin: Secondary | ICD-10-CM | POA: Diagnosis not present

## 2020-07-19 DIAGNOSIS — K219 Gastro-esophageal reflux disease without esophagitis: Secondary | ICD-10-CM | POA: Diagnosis not present

## 2020-07-19 DIAGNOSIS — F319 Bipolar disorder, unspecified: Secondary | ICD-10-CM | POA: Diagnosis not present

## 2020-07-19 DIAGNOSIS — Z7984 Long term (current) use of oral hypoglycemic drugs: Secondary | ICD-10-CM | POA: Diagnosis not present

## 2020-07-19 DIAGNOSIS — Z9181 History of falling: Secondary | ICD-10-CM | POA: Diagnosis not present

## 2020-07-19 DIAGNOSIS — S8992XD Unspecified injury of left lower leg, subsequent encounter: Secondary | ICD-10-CM | POA: Diagnosis not present

## 2020-07-19 LAB — COMPREHENSIVE METABOLIC PANEL
ALT: 10 IU/L (ref 0–32)
AST: 12 IU/L (ref 0–40)
Albumin/Globulin Ratio: 1.2 (ref 1.2–2.2)
Albumin: 3.7 g/dL — ABNORMAL LOW (ref 3.8–4.8)
Alkaline Phosphatase: 75 IU/L (ref 44–121)
BUN/Creatinine Ratio: 13 (ref 9–23)
BUN: 12 mg/dL (ref 6–24)
Bilirubin Total: 0.2 mg/dL (ref 0.0–1.2)
CO2: 19 mmol/L — ABNORMAL LOW (ref 20–29)
Calcium: 8.3 mg/dL — ABNORMAL LOW (ref 8.7–10.2)
Chloride: 102 mmol/L (ref 96–106)
Creatinine, Ser: 0.89 mg/dL (ref 0.57–1.00)
Globulin, Total: 3.1 g/dL (ref 1.5–4.5)
Glucose: 82 mg/dL (ref 65–99)
Potassium: 4 mmol/L (ref 3.5–5.2)
Sodium: 140 mmol/L (ref 134–144)
Total Protein: 6.8 g/dL (ref 6.0–8.5)
eGFR: 83 mL/min/{1.73_m2} (ref >59)

## 2020-07-19 LAB — HEMOGLOBIN A1C
Est. average glucose Bld gHb Est-mCnc: 114 mg/dL
Hgb A1c MFr Bld: 5.6 % (ref 4.8–5.6)

## 2020-07-19 LAB — VITAMIN D 25 HYDROXY (VIT D DEFICIENCY, FRACTURES): Vit D, 25-Hydroxy: 68.8 ng/mL (ref 30.0–100.0)

## 2020-07-19 LAB — LIPID PANEL
Chol/HDL Ratio: 3.3 ratio (ref 0.0–4.4)
Cholesterol, Total: 193 mg/dL (ref 100–199)
HDL: 59 mg/dL (ref >39)
LDL Chol Calc (NIH): 111 mg/dL — ABNORMAL HIGH (ref 0–99)
Triglycerides: 128 mg/dL (ref 0–149)
VLDL Cholesterol Cal: 23 mg/dL (ref 5–40)

## 2020-07-19 LAB — INSULIN, RANDOM: INSULIN: 10.8 u[IU]/mL (ref 2.6–24.9)

## 2020-07-22 ENCOUNTER — Encounter: Payer: Self-pay | Admitting: Internal Medicine

## 2020-07-22 ENCOUNTER — Ambulatory Visit (INDEPENDENT_AMBULATORY_CARE_PROVIDER_SITE_OTHER): Payer: Medicare HMO | Admitting: Internal Medicine

## 2020-07-22 ENCOUNTER — Other Ambulatory Visit: Payer: Self-pay

## 2020-07-22 DIAGNOSIS — E1165 Type 2 diabetes mellitus with hyperglycemia: Secondary | ICD-10-CM

## 2020-07-22 DIAGNOSIS — M25562 Pain in left knee: Secondary | ICD-10-CM

## 2020-07-22 DIAGNOSIS — E559 Vitamin D deficiency, unspecified: Secondary | ICD-10-CM | POA: Diagnosis not present

## 2020-07-22 DIAGNOSIS — E538 Deficiency of other specified B group vitamins: Secondary | ICD-10-CM

## 2020-07-22 NOTE — Progress Notes (Signed)
Subjective:  Patient ID: Brenda Hernandez, female    DOB: Jun 20, 1975  Age: 45 y.o. MRN: [id number]  CC: Follow-up (3 month f/u- Want to discuss getting some diabetic socks or compression stocking)   HPI Brenda Hernandez presents for L knee pain  -it is better.  She is here with her dad Brenda Hernandez. Pain is 3-4 w/walking, no pain at rest She is in PT 2/wk at home Pt lost wt loss on diet PT via [facility] at home  Outpatient Medications Prior to Visit  Medication Sig Dispense Refill  . B Complex-Folic Acid (B COMPLEX PLUS) TABS Take 1 tablet by mouth daily. 100 tablet 5  . clomiPRAMINE (ANAFRANIL) 75 MG capsule Take 75 mg by mouth 2 (two) times daily.    . cloZAPine (CLOZARIL) 100 MG tablet Take 500 mg by mouth daily.    Marland Kitchen ibuprofen (ADVIL) 400 MG tablet Take 1 tablet (400 mg total) by mouth every 8 (eight) hours as needed for moderate pain. 90 tablet 2  . levonorgestrel-ethinyl estradiol (ALESSE) 0.1-20 MG-MCG tablet Sronyx 0.1 mg-20 mcg tablet  TAKE 1 TABLET BY MOUTH EVERY DAY    . metFORMIN (GLUCOPHAGE) 1000 MG tablet Take 1,000 mg by mouth 2 (two) times daily with a meal.    . valproic acid (DEPAKENE) 250 MG/5ML solution Take 250 mg by mouth at bedtime.    . Vitamin D, Ergocalciferol, (DRISDOL) 1.25 MG (50000 UNIT) CAPS capsule Take 1 capsule (50,000 Units total) by mouth every 7 (seven) days. 4 capsule 0   No facility-administered medications prior to visit.    ROS: Review of Systems  Constitutional: Negative for activity change, appetite change, chills, fatigue and unexpected weight change.  HENT: Negative for congestion, mouth sores and sinus pressure.   Eyes: Negative for visual disturbance.  Respiratory: Negative for cough and chest tightness.   Gastrointestinal: Negative for abdominal pain and nausea.  Genitourinary: Negative for difficulty urinating, frequency and vaginal pain.  Musculoskeletal: Positive for gait problem. Negative for back pain.  Skin: Negative for pallor and rash.   Neurological: Negative for dizziness, tremors, weakness, numbness and headaches.  Psychiatric/Behavioral: Negative for confusion, sleep disturbance and suicidal ideas.    Objective:  BP 130/84 (BP Location: Left Arm)   Pulse 98   Temp 98.1 F (36.7 C) (Oral)   Ht 5\' 3"  (1.6 m)   Wt 268 lb 3.2 oz (121.7 kg)   SpO2 99%   BMI 47.51 kg/m   BP Readings from Last 3 Encounters:  07/22/20 130/84  07/18/20 128/80  06/27/20 127/80    Wt Readings from Last 3 Encounters:  07/22/20 268 lb 3.2 oz (121.7 kg)  07/18/20 259 lb (117.5 kg)  06/27/20 258 lb (117 kg)    Physical Exam Constitutional:      General: She is not in acute distress.    Appearance: She is well-developed.  HENT:     Head: Normocephalic.     Right Ear: External ear normal.     Left Ear: External ear normal.     Nose: Nose normal.  Eyes:     General:        Right eye: No discharge.        Left eye: No discharge.     Conjunctiva/sclera: Conjunctivae normal.     Pupils: Pupils are equal, round, and reactive to light.  Neck:     Thyroid: No thyromegaly.     Vascular: No JVD.     Trachea: No tracheal deviation.  Cardiovascular:  Rate and Rhythm: Normal rate and regular rhythm.     Heart sounds: Normal heart sounds.  Pulmonary:     Effort: No respiratory distress.     Breath sounds: No stridor. No wheezing.  Abdominal:     General: Bowel sounds are normal. There is no distension.     Palpations: Abdomen is soft. There is no mass.     Tenderness: There is no abdominal tenderness. There is no guarding or rebound.  Musculoskeletal:        General: Tenderness present.     Cervical back: Normal range of motion and neck supple.  Lymphadenopathy:     Cervical: No cervical adenopathy.  Skin:    Findings: No erythema or rash.  Neurological:     Cranial Nerves: No cranial nerve deficit.     Motor: No abnormal muscle tone.     Coordination: Coordination normal.     Deep Tendon Reflexes: Reflexes normal.   Psychiatric:        Behavior: Behavior normal.        Thought Content: Thought content normal.        Judgment: Judgment normal.   L knee is painful w/ROM The patient is in a wheelchair  Lab Results  Component Value Date   WBC 8.4 06/29/2020   HGB 13.6 06/29/2020   HCT 41.1 06/29/2020   PLT 411 (H) 06/29/2020   GLUCOSE 82 07/18/2020   CHOL 193 07/18/2020   TRIG 128 07/18/2020   HDL 59 07/18/2020   LDLCALC 111 (H) 07/18/2020   ALT 10 07/18/2020   AST 12 07/18/2020   NA 140 07/18/2020   K 4.0 07/18/2020   CL 102 07/18/2020   CREATININE 0.89 07/18/2020   BUN 12 07/18/2020   CO2 19 (L) 07/18/2020   TSH 1.980 03/26/2020   HGBA1C 5.6 07/18/2020    No results found.  Assessment & Plan:    Brenda Kehr, MD

## 2020-07-22 NOTE — Assessment & Plan Note (Addendum)
Knee MRI 3/22 IMPRESSION: 1. Radial tear of the posterior of the medial meniscus with peripheral meniscal extrusion. 2. Nondisplaced, nondepressed subchondral insufficiency fractures of the medial and lateral femoral condyles and medial tibial plateau with severe surrounding marrow edema. 3. Tricompartmental cartilage abnormalities as described above. 4. Incompletely visualized fluid collection in the medial aspect of the distal femoral diaphysis measuring approximately 18 by 10 mm in axial dimension of indeterminate etiology. This may reflect a liquified hematoma, but an infectious etiology (abscess) cannot be excluded. If there is further clinical concern, recommend an MRI of the left thigh. 5. Large knee joint effusion likely related to the knee pathology described above. If there is clinical concern regarding septic arthritis given the soft tissue fluid collection, recommend arthrocentesis.   Electronically Signed   By: Kathreen Devoid   On: 05/26/2020 08:43

## 2020-07-22 NOTE — Assessment & Plan Note (Signed)
Vit D TUMS

## 2020-07-22 NOTE — Assessment & Plan Note (Signed)
Metformin 

## 2020-07-22 NOTE — Assessment & Plan Note (Addendum)
On Vit D On B12

## 2020-07-23 DIAGNOSIS — M84352G Stress fracture, left femur, subsequent encounter for fracture with delayed healing: Secondary | ICD-10-CM | POA: Diagnosis not present

## 2020-07-23 DIAGNOSIS — Z9181 History of falling: Secondary | ICD-10-CM | POA: Diagnosis not present

## 2020-07-23 DIAGNOSIS — K219 Gastro-esophageal reflux disease without esophagitis: Secondary | ICD-10-CM | POA: Diagnosis not present

## 2020-07-23 DIAGNOSIS — Z7982 Long term (current) use of aspirin: Secondary | ICD-10-CM | POA: Diagnosis not present

## 2020-07-23 DIAGNOSIS — F319 Bipolar disorder, unspecified: Secondary | ICD-10-CM | POA: Diagnosis not present

## 2020-07-23 DIAGNOSIS — Z7984 Long term (current) use of oral hypoglycemic drugs: Secondary | ICD-10-CM | POA: Diagnosis not present

## 2020-07-23 DIAGNOSIS — E119 Type 2 diabetes mellitus without complications: Secondary | ICD-10-CM | POA: Diagnosis not present

## 2020-07-23 DIAGNOSIS — S8992XD Unspecified injury of left lower leg, subsequent encounter: Secondary | ICD-10-CM | POA: Diagnosis not present

## 2020-07-24 DIAGNOSIS — F429 Obsessive-compulsive disorder, unspecified: Secondary | ICD-10-CM | POA: Diagnosis not present

## 2020-07-24 DIAGNOSIS — F259 Schizoaffective disorder, unspecified: Secondary | ICD-10-CM | POA: Diagnosis not present

## 2020-07-24 DIAGNOSIS — G2401 Drug induced subacute dyskinesia: Secondary | ICD-10-CM | POA: Diagnosis not present

## 2020-07-25 DIAGNOSIS — S8992XD Unspecified injury of left lower leg, subsequent encounter: Secondary | ICD-10-CM | POA: Diagnosis not present

## 2020-07-25 DIAGNOSIS — M84352G Stress fracture, left femur, subsequent encounter for fracture with delayed healing: Secondary | ICD-10-CM | POA: Diagnosis not present

## 2020-07-25 DIAGNOSIS — Z7984 Long term (current) use of oral hypoglycemic drugs: Secondary | ICD-10-CM | POA: Diagnosis not present

## 2020-07-25 DIAGNOSIS — E119 Type 2 diabetes mellitus without complications: Secondary | ICD-10-CM | POA: Diagnosis not present

## 2020-07-25 DIAGNOSIS — K219 Gastro-esophageal reflux disease without esophagitis: Secondary | ICD-10-CM | POA: Diagnosis not present

## 2020-07-25 DIAGNOSIS — F319 Bipolar disorder, unspecified: Secondary | ICD-10-CM | POA: Diagnosis not present

## 2020-07-25 DIAGNOSIS — Z7982 Long term (current) use of aspirin: Secondary | ICD-10-CM | POA: Diagnosis not present

## 2020-07-25 DIAGNOSIS — Z9181 History of falling: Secondary | ICD-10-CM | POA: Diagnosis not present

## 2020-07-29 DIAGNOSIS — F319 Bipolar disorder, unspecified: Secondary | ICD-10-CM | POA: Diagnosis not present

## 2020-07-29 DIAGNOSIS — Z9181 History of falling: Secondary | ICD-10-CM | POA: Diagnosis not present

## 2020-07-29 DIAGNOSIS — K219 Gastro-esophageal reflux disease without esophagitis: Secondary | ICD-10-CM | POA: Diagnosis not present

## 2020-07-29 DIAGNOSIS — S8992XD Unspecified injury of left lower leg, subsequent encounter: Secondary | ICD-10-CM | POA: Diagnosis not present

## 2020-07-29 DIAGNOSIS — M84352G Stress fracture, left femur, subsequent encounter for fracture with delayed healing: Secondary | ICD-10-CM | POA: Diagnosis not present

## 2020-07-29 DIAGNOSIS — E119 Type 2 diabetes mellitus without complications: Secondary | ICD-10-CM | POA: Diagnosis not present

## 2020-07-29 DIAGNOSIS — Z7982 Long term (current) use of aspirin: Secondary | ICD-10-CM | POA: Diagnosis not present

## 2020-07-29 DIAGNOSIS — Z7984 Long term (current) use of oral hypoglycemic drugs: Secondary | ICD-10-CM | POA: Diagnosis not present

## 2020-07-31 DIAGNOSIS — Z7982 Long term (current) use of aspirin: Secondary | ICD-10-CM | POA: Diagnosis not present

## 2020-07-31 DIAGNOSIS — S8992XD Unspecified injury of left lower leg, subsequent encounter: Secondary | ICD-10-CM | POA: Diagnosis not present

## 2020-07-31 DIAGNOSIS — E119 Type 2 diabetes mellitus without complications: Secondary | ICD-10-CM | POA: Diagnosis not present

## 2020-07-31 DIAGNOSIS — K219 Gastro-esophageal reflux disease without esophagitis: Secondary | ICD-10-CM | POA: Diagnosis not present

## 2020-07-31 DIAGNOSIS — Z9181 History of falling: Secondary | ICD-10-CM | POA: Diagnosis not present

## 2020-07-31 DIAGNOSIS — Z7984 Long term (current) use of oral hypoglycemic drugs: Secondary | ICD-10-CM | POA: Diagnosis not present

## 2020-07-31 DIAGNOSIS — F319 Bipolar disorder, unspecified: Secondary | ICD-10-CM | POA: Diagnosis not present

## 2020-07-31 DIAGNOSIS — M84352G Stress fracture, left femur, subsequent encounter for fracture with delayed healing: Secondary | ICD-10-CM | POA: Diagnosis not present

## 2020-08-01 ENCOUNTER — Other Ambulatory Visit: Payer: Self-pay

## 2020-08-01 ENCOUNTER — Ambulatory Visit (INDEPENDENT_AMBULATORY_CARE_PROVIDER_SITE_OTHER): Payer: Medicare HMO | Admitting: Physician Assistant

## 2020-08-01 ENCOUNTER — Encounter (INDEPENDENT_AMBULATORY_CARE_PROVIDER_SITE_OTHER): Payer: Self-pay | Admitting: Physician Assistant

## 2020-08-01 VITALS — BP 119/81 | HR 95 | Temp 98.1°F | Ht 63.0 in | Wt 258.0 lb

## 2020-08-01 DIAGNOSIS — Z6841 Body Mass Index (BMI) 40.0 and over, adult: Secondary | ICD-10-CM

## 2020-08-01 DIAGNOSIS — E559 Vitamin D deficiency, unspecified: Secondary | ICD-10-CM

## 2020-08-01 DIAGNOSIS — R7303 Prediabetes: Secondary | ICD-10-CM | POA: Diagnosis not present

## 2020-08-01 MED ORDER — VITAMIN D 50 MCG (2000 UT) PO TABS: ORAL_TABLET | ORAL | 0 refills | Status: DC

## 2020-08-01 NOTE — Progress Notes (Signed)
Chief Complaint:   OBESITY Brenda Hernandez is here to discuss her progress with her obesity treatment plan along with follow-up of her obesity related diagnoses. Lauryl is on the Category 3 Plan and states she is following her eating plan approximately 50% of the time. Linzy states she is doing 0 minutes 0 times per week.  Today's visit was #: 9 Starting weight: 287 lbs Starting date: 03/26/2020 Today's weight: 258 lbs Today's date: 08/01/2020 Total lbs lost to date: 29 Total lbs lost since last in-office visit: 1  Interim History: Khristy is disappointed with her lack of weight loss. She has been skipping lunch on some days.  Subjective:   1. Vitamin D deficiency Jolane is on Vit D weekly, and her last level was at goal. I discussed labs with the patient today.  2. Pre-diabetes Savhanna is on metformin, and she denies polyphagia. Last A1c was 5.6. I discussed labs with the patient today.  Assessment/Plan:   1. Vitamin D deficiency Low Vitamin D level contributes to fatigue and are associated with obesity, breast, and colon cancer. Cayleigh agreed to change to Vitamin D 4,000 units daily with no refills. She will follow-up for routine testing of Vitamin D, at least 2-3 times per year to avoid over-replacement.  - Cholecalciferol (VITAMIN D) 50 MCG (2000 UT) tablet; Take 2 caps daily to total 4,000 units  Dispense: 60 tablet; Refill: 0  2. Pre-diabetes Ailah will continue metformin and her meal plan, and she will continue to work on weight loss, exercise, and decreasing simple carbohydrates to help decrease the risk of diabetes.   3. Class 3 severe obesity with serious comorbidity and body mass index (BMI) of 45.0 to 49.9 in adult, unspecified obesity type ([place]) Derrica is currently in the action stage of change. As such, her goal is to continue with weight loss efforts. She has agreed to the Category 3 Plan.   Exercise goals: No exercise has been prescribed at this  time.  Behavioral modification strategies: meal planning and cooking strategies and keeping healthy foods in the home.  Lavaughn has agreed to follow-up with our clinic in 4 to 5 weeks. She was informed of the importance of frequent follow-up visits to maximize her success with intensive lifestyle modifications for her multiple health conditions.   Objective:   Blood pressure 119/81, pulse 95, temperature 98.1 F (36.7 C), temperature source Oral, height 5\' 3"  (1.6 m), weight 258 lb (117 kg), SpO2 98 %. Body mass index is 45.7 kg/m.  General: Cooperative, alert, well developed, in no acute distress. HEENT: Conjunctivae and lids unremarkable. Cardiovascular: Regular rhythm.  Lungs: Normal work of breathing. Neurologic: No focal deficits.   Lab Results  Component Value Date   CREATININE 0.89 07/18/2020   BUN 12 07/18/2020   NA 140 07/18/2020   K 4.0 07/18/2020   CL 102 07/18/2020   CO2 19 (L) 07/18/2020   Lab Results  Component Value Date   ALT 10 07/18/2020   AST 12 07/18/2020   ALKPHOS 75 07/18/2020   BILITOT 0.2 07/18/2020   Lab Results  Component Value Date   HGBA1C 5.6 07/18/2020   HGBA1C 5.8 (H) 03/26/2020   HGBA1C 5.4 02/03/2020   HGBA1C 5.5 07/29/2019   HGBA1C 5.5 12/31/2018   Lab Results  Component Value Date   INSULIN 10.8 07/18/2020   INSULIN 24.7 03/26/2020   Lab Results  Component Value Date   TSH 1.980 03/26/2020   Lab Results  Component Value Date  CHOL 193 07/18/2020   HDL 59 07/18/2020   LDLCALC 111 (H) 07/18/2020   TRIG 128 07/18/2020   CHOLHDL 3.3 07/18/2020   Lab Results  Component Value Date   WBC 8.4 06/29/2020   HGB 13.6 06/29/2020   HCT 41.1 06/29/2020   MCV 87.6 06/29/2020   PLT 411 (H) 06/29/2020   No results found for: IRON, TIBC, FERRITIN  Obesity Behavioral Intervention:   Approximately 15 minutes were spent on the discussion below.  ASK: We discussed the diagnosis of obesity with Li today and Yu agreed to  give [place] permission to discuss obesity behavioral modification therapy today.  ASSESS: Kasarah has the diagnosis of obesity and her BMI today is 45.71. Aailyah is in the action stage of change.   ADVISE: Thecla was educated on the multiple health risks of obesity as well as the benefit of weight loss to improve her health. She was advised of the need for long term treatment and the importance of lifestyle modifications to improve her current health and to decrease her risk of future health problems.  AGREE: Multiple dietary modification options and treatment options were discussed and Kili agreed to follow the recommendations documented in the above note.  ARRANGE: Analiya was educated on the importance of frequent visits to treat obesity as outlined per CMS and USPSTF guidelines and agreed to schedule her next follow up appointment today.  Attestation Statements:   Reviewed by clinician on day of visit: allergies, medications, problem list, medical history, surgical history, family history, social history, and previous encounter notes.   [name], am acting as transcriptionist for [employer], PA-C.  I have reviewed the above documentation for accuracy and completeness, and I agree with the above. Abby Potash, PA-C

## 2020-08-03 ENCOUNTER — Other Ambulatory Visit (HOSPITAL_COMMUNITY)
Admission: AD | Admit: 2020-08-03 | Discharge: 2020-08-03 | Disposition: A | Discharge status: Home / Self Care | Payer: Medicare HMO | Attending: Psychiatry | Admitting: Psychiatry

## 2020-08-03 DIAGNOSIS — Z79899 Other long term (current) drug therapy: Secondary | ICD-10-CM | POA: Diagnosis not present

## 2020-08-03 DIAGNOSIS — E559 Vitamin D deficiency, unspecified: Secondary | ICD-10-CM | POA: Insufficient documentation

## 2020-08-03 DIAGNOSIS — F259 Schizoaffective disorder, unspecified: Secondary | ICD-10-CM | POA: Diagnosis not present

## 2020-08-03 DIAGNOSIS — E58 Dietary calcium deficiency: Secondary | ICD-10-CM | POA: Diagnosis not present

## 2020-08-03 DIAGNOSIS — R6889 Other general symptoms and signs: Secondary | ICD-10-CM | POA: Diagnosis not present

## 2020-08-03 DIAGNOSIS — R7309 Other abnormal glucose: Secondary | ICD-10-CM | POA: Diagnosis not present

## 2020-08-03 LAB — COMPREHENSIVE METABOLIC PANEL
ALT: 13 U/L (ref 0–44)
AST: 18 U/L (ref 15–41)
Albumin: 3 g/dL — ABNORMAL LOW (ref 3.5–5.0)
Alkaline Phosphatase: 60 U/L (ref 38–126)
Anion gap: 8 (ref 5–15)
BUN: 10 mg/dL (ref 6–20)
CO2: 25 mmol/L (ref 22–32)
Calcium: 8.6 mg/dL — ABNORMAL LOW (ref 8.9–10.3)
Chloride: 107 mmol/L (ref 98–111)
Creatinine, Ser: 0.74 mg/dL (ref 0.44–1.00)
GFR, Estimated: >60 mL/min (ref >60)
Glucose, Bld: 91 mg/dL (ref 70–99)
Potassium: 3.8 mmol/L (ref 3.5–5.1)
Sodium: 140 mmol/L (ref 135–145)
Total Bilirubin: 0.5 mg/dL (ref 0.3–1.2)
Total Protein: 6.4 g/dL — ABNORMAL LOW (ref 6.5–8.1)

## 2020-08-03 LAB — FOLATE: Folate: 23.6 ng/mL (ref >5.9)

## 2020-08-03 LAB — LIPID PANEL
Cholesterol: 194 mg/dL (ref 0–200)
HDL: 57 mg/dL (ref >40)
LDL Cholesterol: 117 mg/dL — ABNORMAL HIGH (ref 0–99)
Total CHOL/HDL Ratio: 3.4 RATIO
Triglycerides: 100 mg/dL (ref <150)
VLDL: 20 mg/dL (ref 0–40)

## 2020-08-03 LAB — CBC WITH DIFFERENTIAL/PLATELET
Abs Immature Granulocytes: 0.04 10*3/uL (ref 0.00–0.07)
Basophils Absolute: 0 10*3/uL (ref 0.0–0.1)
Basophils Relative: 0 %
Eosinophils Absolute: 0.1 10*3/uL (ref 0.0–0.5)
Eosinophils Relative: 2 %
HCT: 39.7 % (ref 36.0–46.0)
Hemoglobin: 12.9 g/dL (ref 12.0–15.0)
Immature Granulocytes: 1 %
Lymphocytes Relative: 44 %
Lymphs Abs: 3 10*3/uL (ref 0.7–4.0)
MCH: 29 pg (ref 26.0–34.0)
MCHC: 32.5 g/dL (ref 30.0–36.0)
MCV: 89.2 fL (ref 80.0–100.0)
Monocytes Absolute: 0.4 10*3/uL (ref 0.1–1.0)
Monocytes Relative: 6 %
Neutro Abs: 3.1 10*3/uL (ref 1.7–7.7)
Neutrophils Relative %: 47 %
Platelets: 281 10*3/uL (ref 150–400)
RBC: 4.45 MIL/uL (ref 3.87–5.11)
RDW: 18 % — ABNORMAL HIGH (ref 11.5–15.5)
WBC: 6.7 10*3/uL (ref 4.0–10.5)
nRBC: 0 % (ref 0.0–0.2)

## 2020-08-03 LAB — VITAMIN B12: Vitamin B-12: 461 pg/mL (ref 180–914)

## 2020-08-03 LAB — HEMOGLOBIN A1C
Hgb A1c MFr Bld: 5.7 % — ABNORMAL HIGH (ref 4.8–5.6)
Mean Plasma Glucose: 116.89 mg/dL

## 2020-08-03 LAB — VITAMIN D 25 HYDROXY (VIT D DEFICIENCY, FRACTURES): Vit D, 25-Hydroxy: 91.02 ng/mL (ref 30–100)

## 2020-08-03 LAB — VALPROIC ACID LEVEL: Valproic Acid Lvl: 55 ug/mL (ref 50.0–100.0)

## 2020-08-05 DIAGNOSIS — K219 Gastro-esophageal reflux disease without esophagitis: Secondary | ICD-10-CM | POA: Diagnosis not present

## 2020-08-05 DIAGNOSIS — M84352G Stress fracture, left femur, subsequent encounter for fracture with delayed healing: Secondary | ICD-10-CM | POA: Diagnosis not present

## 2020-08-05 DIAGNOSIS — E119 Type 2 diabetes mellitus without complications: Secondary | ICD-10-CM | POA: Diagnosis not present

## 2020-08-05 DIAGNOSIS — Z7982 Long term (current) use of aspirin: Secondary | ICD-10-CM | POA: Diagnosis not present

## 2020-08-05 DIAGNOSIS — S8992XD Unspecified injury of left lower leg, subsequent encounter: Secondary | ICD-10-CM | POA: Diagnosis not present

## 2020-08-05 DIAGNOSIS — F319 Bipolar disorder, unspecified: Secondary | ICD-10-CM | POA: Diagnosis not present

## 2020-08-05 DIAGNOSIS — Z7984 Long term (current) use of oral hypoglycemic drugs: Secondary | ICD-10-CM | POA: Diagnosis not present

## 2020-08-05 DIAGNOSIS — Z9181 History of falling: Secondary | ICD-10-CM | POA: Diagnosis not present

## 2020-08-06 DIAGNOSIS — M79604 Pain in right leg: Secondary | ICD-10-CM | POA: Diagnosis not present

## 2020-08-06 DIAGNOSIS — M79605 Pain in left leg: Secondary | ICD-10-CM | POA: Diagnosis not present

## 2020-08-06 LAB — CLOZAPINE (CLOZARIL)
Clozapine Lvl: 490 ng/mL (ref 350–650)
NorClozapine: 148 ng/mL
Total(Cloz+Norcloz): 638 ng/mL

## 2020-08-08 DIAGNOSIS — F319 Bipolar disorder, unspecified: Secondary | ICD-10-CM | POA: Diagnosis not present

## 2020-08-08 DIAGNOSIS — K219 Gastro-esophageal reflux disease without esophagitis: Secondary | ICD-10-CM | POA: Diagnosis not present

## 2020-08-08 DIAGNOSIS — M84352G Stress fracture, left femur, subsequent encounter for fracture with delayed healing: Secondary | ICD-10-CM | POA: Diagnosis not present

## 2020-08-08 DIAGNOSIS — Z7984 Long term (current) use of oral hypoglycemic drugs: Secondary | ICD-10-CM | POA: Diagnosis not present

## 2020-08-08 DIAGNOSIS — Z9181 History of falling: Secondary | ICD-10-CM | POA: Diagnosis not present

## 2020-08-08 DIAGNOSIS — E119 Type 2 diabetes mellitus without complications: Secondary | ICD-10-CM | POA: Diagnosis not present

## 2020-08-08 DIAGNOSIS — Z7982 Long term (current) use of aspirin: Secondary | ICD-10-CM | POA: Diagnosis not present

## 2020-08-08 DIAGNOSIS — S8992XD Unspecified injury of left lower leg, subsequent encounter: Secondary | ICD-10-CM | POA: Diagnosis not present

## 2020-08-11 DIAGNOSIS — M79605 Pain in left leg: Secondary | ICD-10-CM | POA: Diagnosis not present

## 2020-08-11 DIAGNOSIS — M79604 Pain in right leg: Secondary | ICD-10-CM | POA: Diagnosis not present

## 2020-08-16 DIAGNOSIS — Z23 Encounter for immunization: Secondary | ICD-10-CM | POA: Diagnosis not present

## 2020-08-19 ENCOUNTER — Telehealth (INDEPENDENT_AMBULATORY_CARE_PROVIDER_SITE_OTHER): Payer: Self-pay | Admitting: Physician Assistant

## 2020-08-19 NOTE — Telephone Encounter (Signed)
Patient has questions regarding Vitamin D.

## 2020-08-20 NOTE — Telephone Encounter (Signed)
She should not need it til I see her. Thanks

## 2020-08-20 NOTE — Telephone Encounter (Signed)
Spoke with the patient and per Linus Orn last office note advised her to take 4000 units of Vitamin D daily until her next visit. Patient verbalized understanding. Anes Rigel, [place]

## 2020-08-30 ENCOUNTER — Other Ambulatory Visit (HOSPITAL_COMMUNITY)
Admission: AD | Admit: 2020-08-30 | Discharge: 2020-08-30 | Disposition: A | Discharge status: Home / Self Care | Payer: Medicare HMO | Source: Ambulatory Visit | Attending: Psychiatry | Admitting: Psychiatry

## 2020-08-30 DIAGNOSIS — Z79899 Other long term (current) drug therapy: Secondary | ICD-10-CM | POA: Insufficient documentation

## 2020-08-30 DIAGNOSIS — F259 Schizoaffective disorder, unspecified: Secondary | ICD-10-CM | POA: Diagnosis not present

## 2020-08-30 LAB — CBC WITH DIFFERENTIAL/PLATELET
Abs Immature Granulocytes: 0.02 10*3/uL (ref 0.00–0.07)
Basophils Absolute: 0 10*3/uL (ref 0.0–0.1)
Basophils Relative: 0 %
Eosinophils Absolute: 0 10*3/uL (ref 0.0–0.5)
Eosinophils Relative: 0 %
HCT: 37.2 % (ref 36.0–46.0)
Hemoglobin: 12.3 g/dL (ref 12.0–15.0)
Immature Granulocytes: 0 %
Lymphocytes Relative: 34 %
Lymphs Abs: 3 10*3/uL (ref 0.7–4.0)
MCH: 29.4 pg (ref 26.0–34.0)
MCHC: 33.1 g/dL (ref 30.0–36.0)
MCV: 89 fL (ref 80.0–100.0)
Monocytes Absolute: 0.5 10*3/uL (ref 0.1–1.0)
Monocytes Relative: 6 %
Neutro Abs: 5.3 10*3/uL (ref 1.7–7.7)
Neutrophils Relative %: 60 %
Platelets: 295 10*3/uL (ref 150–400)
RBC: 4.18 MIL/uL (ref 3.87–5.11)
RDW: 18.9 % — ABNORMAL HIGH (ref 11.5–15.5)
WBC: 8.8 10*3/uL (ref 4.0–10.5)
nRBC: 0 % (ref 0.0–0.2)

## 2020-09-02 ENCOUNTER — Encounter (INDEPENDENT_AMBULATORY_CARE_PROVIDER_SITE_OTHER): Payer: Self-pay | Admitting: Family Medicine

## 2020-09-02 ENCOUNTER — Ambulatory Visit (INDEPENDENT_AMBULATORY_CARE_PROVIDER_SITE_OTHER): Payer: Medicare HMO | Admitting: Family Medicine

## 2020-09-02 ENCOUNTER — Other Ambulatory Visit: Payer: Self-pay

## 2020-09-02 VITALS — BP 117/84 | HR 90 | Temp 98.4°F | Ht 63.0 in | Wt 258.0 lb

## 2020-09-02 DIAGNOSIS — Z6841 Body Mass Index (BMI) 40.0 and over, adult: Secondary | ICD-10-CM

## 2020-09-02 DIAGNOSIS — R7303 Prediabetes: Secondary | ICD-10-CM

## 2020-09-02 DIAGNOSIS — E559 Vitamin D deficiency, unspecified: Secondary | ICD-10-CM | POA: Diagnosis not present

## 2020-09-03 DIAGNOSIS — M25562 Pain in left knee: Secondary | ICD-10-CM | POA: Diagnosis not present

## 2020-09-03 NOTE — Progress Notes (Signed)
Chief Complaint:   OBESITY Brenda Hernandez is here to discuss her progress with her obesity treatment plan along with follow-up of her obesity related diagnoses. Brenda Hernandez is on the Category 3 Plan and states she is following her eating plan approximately 50% of the time. Brenda Hernandez states she is not currently exercising.  Today's visit was #: 10 Starting weight: 287 lbs Starting date: 03/26/2020 Today's weight: 258 lbs Today's date: 09/02/2020 Total lbs lost to date: 29 Total lbs lost since last in-office visit: 0  Interim History: The last 2 appts, Brenda Hernandez has struggled to see the scale change. She was only eating when she was fed due to being in wheelchair. She is interested in microwave meals. For the 4th of July, she is likely having a family BBQ. She doesn't anticipate upcoming plans/travel. She is interested in incorporating Brenda Hernandez for snack calories.  Subjective:   1. Vitamin D deficiency Brenda Hernandez is unsure as to what vitamin she is suppose to be on. Pt denies nausea, vomiting, and muscle weakness but notes fatigue.   2. Pre-diabetes Brenda Hernandez is on Metformin BID. Her last A1c was 5.7 and insulin level 10.8.  Assessment/Plan:   1. Vitamin D deficiency Low Vitamin D level contributes to fatigue and are associated with obesity, breast, and colon cancer. She agrees to continue to take OTC  Vitamin D and will follow-up for routine testing of Vitamin D, at least 2-3 times per year to avoid over-replacement.  2. Pre-diabetes Brenda Hernandez will continue to work on weight loss, exercise, and decreasing simple carbohydrates to help decrease the risk of diabetes. Continue Metformin. No refill needed today.  3. Class 3 severe obesity with serious comorbidity and body mass index (BMI) of 50.0 to 59.9 in adult, unspecified obesity type (Brenda Hernandez)  Brenda Hernandez is currently in the action stage of change. As such, her goal is to continue with weight loss efforts. She has agreed  to the Category 3 Plan and keeping a food journal and adhering to recommended goals of 400-500 calories and 40+ g protein with supper.   Exercise goals:  As is  Behavioral modification strategies: increasing lean protein intake, meal planning and cooking strategies, keeping healthy foods in the home, and planning for success.  Brenda Hernandez has agreed to follow-up with our clinic in 3 weeks. She was informed of the importance of frequent follow-up visits to maximize her success with intensive lifestyle modifications for her multiple health conditions.   Objective:   Blood pressure 117/84, pulse 90, temperature 98.4 F (36.9 C), height 5\' 3"  (1.6 m), weight 258 lb (117 kg), SpO2 97 %. Body mass index is 45.7 kg/m.  General: Cooperative, alert, well developed, in no acute distress. HEENT: Conjunctivae and lids unremarkable. Cardiovascular: Regular rhythm.  Lungs: Normal work of breathing. Neurologic: No focal deficits.   Lab Results  Component Value Date   CREATININE 0.74 08/03/2020   BUN 10 08/03/2020   NA 140 08/03/2020   K 3.8 08/03/2020   CL 107 08/03/2020   CO2 25 08/03/2020   Lab Results  Component Value Date   ALT 13 08/03/2020   AST 18 08/03/2020   ALKPHOS 60 08/03/2020   BILITOT 0.5 08/03/2020   Lab Results  Component Value Date   HGBA1C 5.7 (H) 08/03/2020   HGBA1C 5.6 07/18/2020   HGBA1C 5.8 (H) 03/26/2020   HGBA1C 5.4 02/03/2020   HGBA1C 5.5 07/29/2019   Lab Results  Component Value Date   INSULIN 10.8 07/18/2020  INSULIN 24.7 03/26/2020   Lab Results  Component Value Date   TSH 1.980 03/26/2020   Lab Results  Component Value Date   CHOL 194 08/03/2020   HDL 57 08/03/2020   LDLCALC 117 (H) 08/03/2020   TRIG 100 08/03/2020   CHOLHDL 3.4 08/03/2020   Lab Results  Component Value Date   WBC 8.8 08/30/2020   HGB 12.3 08/30/2020   HCT 37.2 08/30/2020   MCV 89.0 08/30/2020   PLT 295 08/30/2020     Attestation Statements:   Reviewed by clinician  on day of visit: allergies, medications, problem list, medical history, surgical history, family history, social history, and previous encounter notes.  Time spent on visit including pre-visit chart review and post-visit care and charting was 21 minutes.   Coral Ceo, CMA, am acting as transcriptionist for Coralie Common, MD.  I have reviewed the above documentation for accuracy and completeness, and I agree with the above. - Jinny Blossom, MD

## 2020-09-04 DIAGNOSIS — F429 Obsessive-compulsive disorder, unspecified: Secondary | ICD-10-CM | POA: Diagnosis not present

## 2020-09-04 DIAGNOSIS — F259 Schizoaffective disorder, unspecified: Secondary | ICD-10-CM | POA: Diagnosis not present

## 2020-09-04 DIAGNOSIS — G2401 Drug induced subacute dyskinesia: Secondary | ICD-10-CM | POA: Diagnosis not present

## 2020-09-09 DIAGNOSIS — F429 Obsessive-compulsive disorder, unspecified: Secondary | ICD-10-CM | POA: Diagnosis not present

## 2020-09-09 DIAGNOSIS — F259 Schizoaffective disorder, unspecified: Secondary | ICD-10-CM | POA: Diagnosis not present

## 2020-09-09 DIAGNOSIS — G2401 Drug induced subacute dyskinesia: Secondary | ICD-10-CM | POA: Diagnosis not present

## 2020-09-11 DIAGNOSIS — M79604 Pain in right leg: Secondary | ICD-10-CM | POA: Diagnosis not present

## 2020-09-11 DIAGNOSIS — M79605 Pain in left leg: Secondary | ICD-10-CM | POA: Diagnosis not present

## 2020-09-21 ENCOUNTER — Other Ambulatory Visit: Payer: Self-pay

## 2020-09-23 ENCOUNTER — Other Ambulatory Visit: Payer: Self-pay

## 2020-09-23 ENCOUNTER — Encounter: Payer: Self-pay | Admitting: Internal Medicine

## 2020-09-23 ENCOUNTER — Ambulatory Visit (INDEPENDENT_AMBULATORY_CARE_PROVIDER_SITE_OTHER): Payer: Medicare HMO | Admitting: Internal Medicine

## 2020-09-23 DIAGNOSIS — G8929 Other chronic pain: Secondary | ICD-10-CM | POA: Diagnosis not present

## 2020-09-23 DIAGNOSIS — Z6841 Body Mass Index (BMI) 40.0 and over, adult: Secondary | ICD-10-CM

## 2020-09-23 DIAGNOSIS — M25562 Pain in left knee: Secondary | ICD-10-CM

## 2020-09-23 DIAGNOSIS — F250 Schizoaffective disorder, bipolar type: Secondary | ICD-10-CM

## 2020-09-23 DIAGNOSIS — E1165 Type 2 diabetes mellitus with hyperglycemia: Secondary | ICD-10-CM

## 2020-09-23 DIAGNOSIS — E538 Deficiency of other specified B group vitamins: Secondary | ICD-10-CM | POA: Diagnosis not present

## 2020-09-23 DIAGNOSIS — R5383 Other fatigue: Secondary | ICD-10-CM | POA: Diagnosis not present

## 2020-09-23 LAB — T4, FREE: Free T4: 0.84 ng/dL (ref 0.60–1.60)

## 2020-09-23 LAB — TSH: TSH: 1.55 u[IU]/mL (ref 0.35–5.50)

## 2020-09-23 LAB — CORTISOL: Cortisol, Plasma: 9.5 ug/dL

## 2020-09-23 NOTE — Assessment & Plan Note (Addendum)
She is complaining of falling asleep a lot during a conversation.  She is put a few pounds on.  Denies sleep apnea symptoms.  She was checked for sleep apnea in the past.  She had a virtual visit with her psychiatrist and had multiple labs done in a month ago-we reviewed the results together. Sch f/u w/Dr Barbie Banner Recent labs in May - all OK.  Obtain free T4, TSH and cortisol levels.

## 2020-09-23 NOTE — Assessment & Plan Note (Addendum)
Wt Readings from Last 3 Encounters:  09/23/20 266 lb 6.4 oz (120.8 kg)  09/02/20 258 lb (117 kg)  08/01/20 258 lb (117 kg)   Try to loose wt

## 2020-09-23 NOTE — Assessment & Plan Note (Signed)
Continue with metformin.  Her recent A1c was good.

## 2020-09-23 NOTE — Assessment & Plan Note (Addendum)
She is seeing her psychiatrist Dr. Barbie Banner.  She has been sleepy.  Sch f/u w/Dr Barbie Banner Recent labs in May - all OK

## 2020-09-23 NOTE — Progress Notes (Signed)
Subjective:  Patient ID: Brenda Hernandez, female    DOB: 05-20-1975  Age: 45 y.o. MRN: [id number]  CC: Follow-up (2 MONTH F/U)   HPI Brenda Hernandez presents for her left knee pain.  She is not feeling better yet.  The pain remains pretty substantial.  She is limited in her walking ability.  She continues to use a walker.  She moved in to live with her grandmother.  She is here with her dad.  She is complaining of falling asleep a lot during a conversation.  She is put a few pounds on.  Denies sleep apnea symptoms.  She was checked for sleep apnea in the past.  She had a virtual visit with her psychiatrist and had multiple labs done in a month ago-we reviewed the results together.   Outpatient Medications Prior to Visit  Medication Sig Dispense Refill   B Complex-Folic Acid (B COMPLEX PLUS) TABS Take 1 tablet by mouth daily. 100 tablet 5   Cholecalciferol (VITAMIN D) 50 MCG (2000 UT) tablet Take 2 caps daily to total 4,000 units 60 tablet 0   clomiPRAMINE (ANAFRANIL) 75 MG capsule Take 75 mg by mouth 2 (two) times daily.     cloZAPine (CLOZARIL) 100 MG tablet Take 500 mg by mouth daily.     ibuprofen (ADVIL) 400 MG tablet Take 1 tablet (400 mg total) by mouth every 8 (eight) hours as needed for moderate pain. 90 tablet 2   levonorgestrel-ethinyl estradiol (ALESSE) 0.1-20 MG-MCG tablet Sronyx 0.1 mg-20 mcg tablet  TAKE 1 TABLET BY MOUTH EVERY DAY     metFORMIN (GLUCOPHAGE) 1000 MG tablet Take 1,000 mg by mouth 2 (two) times daily with a meal.     valproic acid (DEPAKENE) 250 MG/5ML solution Take 250 mg by mouth at bedtime.     No facility-administered medications prior to visit.    ROS: Review of Systems  Constitutional:  Positive for fatigue. Negative for activity change, appetite change, chills and unexpected weight change.  HENT:  Negative for congestion, mouth sores and sinus pressure.   Eyes:  Negative for visual disturbance.  Respiratory:  Negative for cough and chest tightness.    Gastrointestinal:  Negative for abdominal pain and nausea.  Genitourinary:  Negative for difficulty urinating, frequency and vaginal pain.  Musculoskeletal:  Positive for arthralgias and gait problem. Negative for back pain.  Skin:  Negative for pallor and rash.  Neurological:  Negative for dizziness, tremors, weakness, numbness and headaches.  Psychiatric/Behavioral:  Negative for confusion and sleep disturbance.    Objective:  BP 122/84 (BP Location: Left Arm)   Pulse 100   Temp 98.9 F (37.2 C) (Oral)   Ht 5\' 3"  (1.6 m)   Wt 266 lb 6.4 oz (120.8 kg)   SpO2 96%   BMI 47.19 kg/m   BP Readings from Last 3 Encounters:  09/23/20 122/84  09/02/20 117/84  08/01/20 119/81    Wt Readings from Last 3 Encounters:  09/23/20 266 lb 6.4 oz (120.8 kg)  09/02/20 258 lb (117 kg)  08/01/20 258 lb (117 kg)    Physical Exam Constitutional:      General: She is not in acute distress.    Appearance: She is well-developed.  HENT:     Head: Normocephalic.     Right Ear: External ear normal.     Left Ear: External ear normal.     Nose: Nose normal.  Eyes:     General:        Right eye:  No discharge.        Left eye: No discharge.     Conjunctiva/sclera: Conjunctivae normal.     Pupils: Pupils are equal, round, and reactive to light.  Neck:     Thyroid: No thyromegaly.     Vascular: No JVD.     Trachea: No tracheal deviation.  Cardiovascular:     Rate and Rhythm: Normal rate and regular rhythm.     Heart sounds: Normal heart sounds.  Pulmonary:     Effort: No respiratory distress.     Breath sounds: No stridor. No wheezing.  Abdominal:     General: Bowel sounds are normal. There is no distension.     Palpations: Abdomen is soft. There is no mass.     Tenderness: There is no abdominal tenderness. There is no guarding or rebound.  Musculoskeletal:        General: Tenderness present.     Cervical back: Normal range of motion and neck supple. No rigidity.  Lymphadenopathy:      Cervical: No cervical adenopathy.  Skin:    Findings: No erythema or rash.  Neurological:     Cranial Nerves: No cranial nerve deficit.     Motor: No abnormal muscle tone.     Coordination: Coordination normal.     Deep Tendon Reflexes: Reflexes normal.  Psychiatric:        Behavior: Behavior normal.        Thought Content: Thought content normal.        Judgment: Judgment normal.   Using a walker  Lab Results  Component Value Date   WBC 8.8 08/30/2020   HGB 12.3 08/30/2020   HCT 37.2 08/30/2020   PLT 295 08/30/2020   GLUCOSE 91 08/03/2020   CHOL 194 08/03/2020   TRIG 100 08/03/2020   HDL 57 08/03/2020   LDLCALC 117 (H) 08/03/2020   ALT 13 08/03/2020   AST 18 08/03/2020   NA 140 08/03/2020   K 3.8 08/03/2020   CL 107 08/03/2020   CREATININE 0.74 08/03/2020   BUN 10 08/03/2020   CO2 25 08/03/2020   TSH 1.980 03/26/2020   HGBA1C 5.7 (H) 08/03/2020     Assessment & Plan:    Follow-up: No follow-ups on file.  Walker Kehr, MD

## 2020-09-23 NOTE — Assessment & Plan Note (Addendum)
Brenda Hernandez would like to re-start PT at [facility] Dr [name] is planning to get an MRI

## 2020-09-23 NOTE — Assessment & Plan Note (Signed)
On a super B complex

## 2020-09-26 ENCOUNTER — Other Ambulatory Visit: Payer: Self-pay

## 2020-09-26 ENCOUNTER — Ambulatory Visit (INDEPENDENT_AMBULATORY_CARE_PROVIDER_SITE_OTHER): Payer: Medicare HMO | Admitting: Physician Assistant

## 2020-09-26 ENCOUNTER — Encounter (INDEPENDENT_AMBULATORY_CARE_PROVIDER_SITE_OTHER): Payer: Self-pay | Admitting: Physician Assistant

## 2020-09-26 VITALS — BP 112/79 | HR 93 | Temp 97.4°F | Ht 63.0 in | Wt 261.0 lb

## 2020-09-26 DIAGNOSIS — E559 Vitamin D deficiency, unspecified: Secondary | ICD-10-CM

## 2020-09-26 DIAGNOSIS — Z6841 Body Mass Index (BMI) 40.0 and over, adult: Secondary | ICD-10-CM | POA: Diagnosis not present

## 2020-09-26 MED ORDER — VITAMIN D 50 MCG (2000 UT) PO TABS
ORAL_TABLET | ORAL | 0 refills | Status: DC
Start: 2020-09-26 — End: 2020-10-16

## 2020-09-27 ENCOUNTER — Other Ambulatory Visit (HOSPITAL_COMMUNITY)
Admission: RE | Admit: 2020-09-27 | Discharge: 2020-09-27 | Disposition: A | Discharge status: Home / Self Care | Payer: Medicare HMO | Attending: Psychiatry | Admitting: Psychiatry

## 2020-09-27 DIAGNOSIS — Z79899 Other long term (current) drug therapy: Secondary | ICD-10-CM | POA: Insufficient documentation

## 2020-09-27 DIAGNOSIS — F259 Schizoaffective disorder, unspecified: Secondary | ICD-10-CM | POA: Insufficient documentation

## 2020-09-27 LAB — CBC WITH DIFFERENTIAL/PLATELET
Abs Immature Granulocytes: 0.02 10*3/uL (ref 0.00–0.07)
Basophils Absolute: 0 10*3/uL (ref 0.0–0.1)
Basophils Relative: 0 %
Eosinophils Absolute: 0 10*3/uL (ref 0.0–0.5)
Eosinophils Relative: 0 %
HCT: 36.7 % (ref 36.0–46.0)
Hemoglobin: 12.2 g/dL (ref 12.0–15.0)
Immature Granulocytes: 0 %
Lymphocytes Relative: 44 %
Lymphs Abs: 3.4 10*3/uL (ref 0.7–4.0)
MCH: 30.2 pg (ref 26.0–34.0)
MCHC: 33.2 g/dL (ref 30.0–36.0)
MCV: 90.8 fL (ref 80.0–100.0)
Monocytes Absolute: 0.5 10*3/uL (ref 0.1–1.0)
Monocytes Relative: 6 %
Neutro Abs: 3.9 10*3/uL (ref 1.7–7.7)
Neutrophils Relative %: 50 %
Platelets: 284 10*3/uL (ref 150–400)
RBC: 4.04 MIL/uL (ref 3.87–5.11)
RDW: 17.2 % — ABNORMAL HIGH (ref 11.5–15.5)
WBC: 7.9 10*3/uL (ref 4.0–10.5)
nRBC: 0 % (ref 0.0–0.2)

## 2020-10-07 NOTE — Progress Notes (Signed)
Chief Complaint:   OBESITY Lusia is here to discuss her progress with her obesity treatment plan along with follow-up of her obesity related diagnoses. Seraphine is on the Category 3 Plan and keeping a food journal and adhering to recommended goals of 400-500 calories and 40+ grams of protein at supper daily and states she is following her eating plan approximately 50% of the time. Lyniah states she is doing 0 minutes 0 times per week.  Today's visit was #: 11 Starting weight: 287 lbs Starting date: 03/26/2020 Today's weight: 261 lbs Today's date: 09/26/2020 Total lbs lost to date: 26 Total lbs lost since last in-office visit: 0  Interim History: Murray is confused about why she has gained weight today as she feels she did better with her plan the last few weeks. She has been eating chips, pasta, cereal, fruit, cottage cheese, and microwave meals. She is only eating breakfast and dinner.  Subjective:   1. Vitamin D deficiency Dusti is on Vit D 4,000 units daily, and she is tolerating well.  Assessment/Plan:   1. Vitamin D deficiency Low Vitamin D level contributes to fatigue and are associated with obesity, breast, and colon cancer. We will refill Vitamin D 2,000 IU twice daily for 1 month. Gina will follow-up for routine testing of Vitamin D, at least 2-3 times per year to avoid over-replacement.  - Cholecalciferol (VITAMIN D) 50 MCG (2000 UT) tablet; Take 2 caps daily to total 4,000 units  Dispense: 60 tablet; Refill: 0  2. Class 3 severe obesity with serious comorbidity and body mass index (BMI) of 50.0 to 59.9 in adult, unspecified obesity type ([place]), current bmi 46.25 Donisha is currently in the action stage of change. As such, her goal is to continue with weight loss efforts. She has agreed to the Category 3 Plan.   Exercise goals: No exercise has been prescribed at this time.  Behavioral modification strategies: increasing lean protein intake and no skipping  meals.  Aishah has agreed to follow-up with our clinic in 3 weeks. She was informed of the importance of frequent follow-up visits to maximize her success with intensive lifestyle modifications for her multiple health conditions.   Objective:   Blood pressure 112/79, pulse 93, temperature (!) 97.4 F (36.3 C), height '5\' 3"'$  (1.6 m), weight 261 lb (118.4 kg), SpO2 97 %. Body mass index is 46.23 kg/m.  General: Cooperative, alert, well developed, in no acute distress. HEENT: Conjunctivae and lids unremarkable. Cardiovascular: Regular rhythm.  Lungs: Normal work of breathing. Neurologic: No focal deficits.   Lab Results  Component Value Date   CREATININE 0.74 08/03/2020   BUN 10 08/03/2020   NA 140 08/03/2020   K 3.8 08/03/2020   CL 107 08/03/2020   CO2 25 08/03/2020   Lab Results  Component Value Date   ALT 13 08/03/2020   AST 18 08/03/2020   ALKPHOS 60 08/03/2020   BILITOT 0.5 08/03/2020   Lab Results  Component Value Date   HGBA1C 5.7 (H) 08/03/2020   HGBA1C 5.6 07/18/2020   HGBA1C 5.8 (H) 03/26/2020   HGBA1C 5.4 02/03/2020   HGBA1C 5.5 07/29/2019   Lab Results  Component Value Date   INSULIN 10.8 07/18/2020   INSULIN 24.7 03/26/2020   Lab Results  Component Value Date   TSH 1.55 09/23/2020   Lab Results  Component Value Date   CHOL 194 08/03/2020   HDL 57 08/03/2020   LDLCALC 117 (H) 08/03/2020   TRIG 100 08/03/2020  CHOLHDL 3.4 08/03/2020   Lab Results  Component Value Date   VD25OH 91.02 08/03/2020   VD25OH 68.8 07/18/2020   VD25OH 33.0 03/26/2020   Lab Results  Component Value Date   WBC 7.9 09/27/2020   HGB 12.2 09/27/2020   HCT 36.7 09/27/2020   MCV 90.8 09/27/2020   PLT 284 09/27/2020   No results found for: IRON, TIBC, FERRITIN  Obesity Behavioral Intervention:   Approximately 15 minutes were spent on the discussion below.  ASK: We discussed the diagnosis of obesity with Shondra today and Sherice agreed to give [place] permission to  discuss obesity behavioral modification therapy today.  ASSESS: Vivion has the diagnosis of obesity and her BMI today is 46.25. Saphire is in the action stage of change.   ADVISE: Larice was educated on the multiple health risks of obesity as well as the benefit of weight loss to improve her health. She was advised of the need for long term treatment and the importance of lifestyle modifications to improve her current health and to decrease her risk of future health problems.  AGREE: Multiple dietary modification options and treatment options were discussed and Nabeeha agreed to follow the recommendations documented in the above note.  ARRANGE: Fiora was educated on the importance of frequent visits to treat obesity as outlined per CMS and USPSTF guidelines and agreed to schedule her next follow up appointment today.  Attestation Statements:   Reviewed by clinician on day of visit: allergies, medications, problem list, medical history, surgical history, family history, social history, and previous encounter notes.   [name], am acting as transcriptionist for [employer], PA-C.  I have reviewed the above documentation for accuracy and completeness, and I agree with the above. Abby Potash, PA-C

## 2020-10-09 ENCOUNTER — Other Ambulatory Visit: Payer: Self-pay

## 2020-10-09 ENCOUNTER — Ambulatory Visit: Payer: Medicare HMO | Attending: Physician Assistant

## 2020-10-09 DIAGNOSIS — R2689 Other abnormalities of gait and mobility: Secondary | ICD-10-CM | POA: Diagnosis not present

## 2020-10-09 DIAGNOSIS — R262 Difficulty in walking, not elsewhere classified: Secondary | ICD-10-CM | POA: Insufficient documentation

## 2020-10-09 DIAGNOSIS — M6281 Muscle weakness (generalized): Secondary | ICD-10-CM | POA: Insufficient documentation

## 2020-10-09 DIAGNOSIS — G8929 Other chronic pain: Secondary | ICD-10-CM | POA: Insufficient documentation

## 2020-10-09 DIAGNOSIS — M25562 Pain in left knee: Secondary | ICD-10-CM | POA: Insufficient documentation

## 2020-10-09 NOTE — Patient Instructions (Signed)
Aquatic Therapy at [facility]-  What to Expect!  Where:   [facility] [address] [facility] [place], [place] [address] Rehab phone [phone]  NOTE:  You will receive an automated phone message reminding you of your appt and it will say the appointment is at the [address] [facility] [address] [facility] clinic.          How to Prepare: Please make sure you drink 8 ounces of water about one hour prior to your pool session A caregiver may attend if needed with the patient to help assist as needed. A caregiver can sit in the pool room on chair. Please arrive IN YOUR SUIT and 15 minutes prior to your appointment - this helps to avoid delays in starting your session. Please make sure to attend to any toileting needs prior to entering the pool Locker rooms for changing are provided.   There is direct access to the pool deck form the locker room.  You can lock your belongings in a locker with lock provided. Once on the pool deck your therapist will ask if you have signed the Patient  Consent and Assignment of Benefits form before beginning treatment Your therapist may take your blood pressure prior to, during and after your session if indicated We usually try and create a home exercise program based on activities we do in the pool.  Please be thinking about who might be able to assist you in the pool should you need to participate in an aquatic home exercise program at the time of discharge if you need assistance.  Some patients do not want to or do not have the ability to participate in an aquatic home program - this is not a barrier in any way to you participating in aquatic therapy as part of your current therapy plan! After Discharge from PT, you can continue using home program at  the [facility]/, there is a drop-in fee for $5 ($45 a month)or for 60 years  or older $4.00 ($40 a month for seniors ) or any local [facility] pool.  Memberships for purchase are  available for gym/pool at [facility]  IT IS VERY IMPORTANT THAT YOUR LAST VISIT BE IN THE CLINIC AT [address] AFTER YOUR LAST AQUATIC VISIT.  PLEASE MAKE SURE THAT YOU HAVE A [address]  APPOINTMENT SCHEDULED.   About the pool: Pool is located approximately 500 FT from the entrance of the building.  Please bring a support person if you need assistance traveling this      distance.   Your therapist will assist you in entering the water; there are two ways to           enter: stairs with railings, and a mechanical lift. Your therapist will determine the most appropriate way for you.  Water temperature is usually between 88-90 degrees  There may be up to 2 other swimmers in the pool at the same time  The pool deck is tile, please wear shoes with good traction if you prefer not to be barefoot.    Contact Info:  For appointment scheduling and cancellations:         Please call the  Outpatient Rehabilitation Center  PH:[phone]              Aquatic Therapy  Outpatient [facility]       All sessions are 45 minutes                                                    

## 2020-10-10 DIAGNOSIS — M84352D Stress fracture, left femur, subsequent encounter for fracture with routine healing: Secondary | ICD-10-CM | POA: Diagnosis not present

## 2020-10-10 DIAGNOSIS — M25562 Pain in left knee: Secondary | ICD-10-CM | POA: Diagnosis not present

## 2020-10-10 NOTE — Therapy (Signed)
Brenda Hernandez, [place], [address] Phone: [phone]   Fax:  [phone]  Physical Therapy Evaluation  Patient Details  Name: Brenda Hernandez MRN: [id number] Date of Birth: October 08, 1975 Referring Provider (PT): Doren Custard Robb Matar, PA-C   Encounter Date: 10/09/2020   PT End of Session - 10/09/20 1655     Visit Number 1    Number of Visits 17    Date for PT Re-Evaluation 12/07/20    Authorization Type [employer]- requesting auth    PT Start Time 1655    PT Stop Time 1746    PT Time Calculation (min) 51 min    Equipment Utilized During Treatment Other (comment)   RW   Activity Tolerance Patient tolerated treatment well    Behavior During Therapy [facility] for tasks assessed/performed             Past Medical History:  Diagnosis Date   Abnormal pap    Bipolar 1 disorder ([place])    GERD (gastroesophageal reflux disease)    Pre-diabetes    Schizophrenia ([place])    Swallowing difficulty     History reviewed. No pertinent surgical history.  There were no vitals filed for this visit.    Subjective Assessment - 10/09/20 1655     Subjective Patient reports sustaining a fall on the ice in January 2022 and injurying her left knee. She had PT in March and received an MRI (see MRI results below). She reports at one point the knee was feeling better, but has since regressed over the past month. She thinks her return of pain could be attributed to walking more and walking without the walker. She has f/u with [facility] tomorrow. She was instructed by referring provider to slowly wean from RW starting at 30 minute increments. She denies pain currently. She reports pain when she walks quickly and transitioning from supine to sit along the anterior knee described as aching rated as 7/10. She has been able to go without her walker for 30 minutes, but reports some pain after this.    Pertinent History bipolar, schizophrenia, GERD     Limitations Lifting;Standing;Walking;House hold activities    How long can you sit comfortably? no issue    How long can you stand comfortably? maybe 10 minutes    How long can you walk comfortably? maybe 30-45    Diagnostic tests IMPRESSION:  1. Radial tear of the posterior of the medial meniscus with  peripheral meniscal extrusion.  2. Nondisplaced, nondepressed subchondral insufficiency fractures of  the medial and lateral femoral condyles and medial tibial plateau  with severe surrounding marrow edema.  3. Tricompartmental cartilage abnormalities as described above.  4. Incompletely visualized fluid collection in the medial aspect of  the distal femoral diaphysis measuring approximately 18 by 10 mm in  axial dimension of indeterminate etiology. This may reflect a  liquified hematoma, but an infectious etiology (abscess) cannot be  excluded. If there is further clinical concern, recommend an MRI of  the left thigh.  5. Large knee joint effusion likely related to the knee pathology  described above. If there is clinical concern regarding septic  arthritis given the soft tissue fluid collection, recommend  arthrocentesis.    Patient Stated Goals I would like to go without my walker    Currently in Pain? No/denies                [facility] PT Assessment - 10/10/20 0001  Assessment   Medical Diagnosis M84.352G (ICD-10-CM) - Stress fracture of left femur with delayed healing    Referring Provider (PT) Cline Crock, PA-C    Onset Date/Surgical Date 04/04/20    Hand Dominance Right    Next MD Visit 10/10/20    Prior Therapy yes      Precautions   Precautions None      Restrictions   Other Position/Activity Restrictions slowly wean from RW      Balance Screen   Has the patient fallen in the past 6 months No      [place] residence   currently stays with grandmother, plans to move back in with mother once she can navigate stairs   Living  Arrangements Other relatives    Additional Comments 3 stairs to enter      Prior Function   Level of Independence Independent    Vocation On disability    Leisure bowling, reading, walks      Cognition   Overall Cognitive Status Within Functional Limits for tasks assessed      Observation/Other Assessments   Focus on Therapeutic Outcomes (FOTO)  26% function to 56% predicted      Sensation   Light Touch Not tested      Coordination   Gross Motor Movements are Fluid and Coordinated Yes      AROM   Left Knee Extension --   WNL   Left Knee Flexion 126      Strength   Right Hip Flexion 5/5    Right Hip Extension 3-/5    Right Hip ABduction 4-/5    Left Hip Flexion 5/5    Left Hip Extension 3-/5    Left Hip ABduction 4-/5    Right Knee Flexion 5/5    Right Knee Extension 5/5    Left Knee Flexion 5/5    Left Knee Extension 5/5      Flexibility   Soft Tissue Assessment /Muscle Length yes    Quadriceps mild tightness bilaterally      Ambulation/Gait   Ambulation/Gait Yes    Assistive device Rolling walker    Gait Pattern Step-to pattern;Decreased stance time - left;Decreased hip/knee flexion - left;Lateral trunk lean to left      Balance   Balance Assessed Yes      Static Standing Balance   Static Standing - Comment/# of Minutes RLE 6 seconds,LLE 1 sec      Timed Up and Go Test   Normal TUG (seconds) 30.6   with RW                       Objective measurements completed on examination: See above findings.       [place] Adult PT Treatment/Exercise - 10/10/20 0001       Self-Care   Self-Care Other Self-Care Comments    Other Self-Care Comments  see patient education      Knee/Hip Exercises: Seated   Long Arc Quad 10 reps    Marching 10 reps      Knee/Hip Exercises: Supine   Straight Leg Raises 5 reps    Straight Leg Raises Limitations bilateral    Other Supine Knee/Hip Exercises resisted hip abduction blue band 1 x 5                     PT Education - 10/09/20 1718     Education Details Education on current condition, POC,  HEP, aquatic PT.    Person(s) Educated Patient    Methods Explanation;Demonstration;Verbal cues;Handout    Comprehension Verbalized understanding;Returned demonstration              PT Short Term Goals - 10/10/20 0914       PT SHORT TERM GOAL #1   Title Patient will be independent with initial HEP.    Baseline issued at eval.    Time 2    Period Weeks    Status New    Target Date 10/24/20      PT SHORT TERM GOAL #2   Title Therapist will review FOTO score and anticipated functional progress.    Baseline FOTO captured    Time 2    Period Weeks    Status New    Target Date 10/24/20      PT SHORT TERM GOAL #3   Title Patient will complete TUG in less than 20 seconds indicative of improvement in gait speed.    Baseline see flowsheet    Time 4    Period Weeks    Status New    Target Date 11/07/20      PT SHORT TERM GOAL #4   Title Patient will maintain Lt SLS for at least 5 seconds to improve stability necessary to reduce need for AD.    Baseline 1 second    Time 4    Period Weeks    Status New    Target Date 11/07/20               PT Long Term Goals - 10/10/20 0916       PT LONG TERM GOAL #1   Title Patient will demonstrate at least 4/5 bilateral hip abductor and extensor strength to improve stability about the chain with prolonged walking/standing.    Baseline see flowsheet    Time 8    Period Weeks    Status New    Target Date 12/05/20      PT LONG TERM GOAL #2   Title Patient will be able to ascend/descend stairs utilizing reciprocal pattern.    Baseline step to, can only go up/down a few stairs right now    Time 8    Period Weeks    Status New    Target Date 12/05/20      PT LONG TERM GOAL #3   Title Patient will ambulate with step through pattern utilizing LRAD.    Baseline step to, RW    Time 8    Period Weeks    Status New     Target Date 12/05/20      PT LONG TERM GOAL #4   Title Patient will score at least 56% function on FOTO to signify clinically meaningful improvement in functional abilities.    Baseline 26    Time 8    Period Weeks    Status New    Target Date 12/05/20                    Plan - 10/09/20 1745     Clinical Impression Statement Patient is a 45 y/o female with chief complaint of chronic Lt knee pain as a result of a fall on the ice in January 2022 where she sustained a medial meniscus tear and nondisplaced, nondepressed subchondral insufficiency fractures of the medial and lateral femoral condyles and medial tibial plateau. She has previously attended PT in March focusing on slowly progressing WBAT on the LLE with a  rolling walker. She reports she was making good progress and having less pain, but over the past month her pain has worsened likley due to increased weight bearing. She has normal and pain free AROM about the Lt knee. She has good strength about the knee, though pain with MMT. She has significant hip weakness, balance deficits, and gait abnormalities. She will benefit from skilled PT to address the above stated deficits in order to return to optimal function and safely progress her weight bearing activity as tolerated.    Personal Factors and Comorbidities Comorbidity 3+;Time since onset of injury/illness/exacerbation;Fitness    Comorbidities bipolar, schizophrenia, GERD    Examination-Activity Limitations Locomotion Level;Transfers;Stand;Lift;Bend    Examination-Participation Restrictions Shop    Stability/Clinical Decision Making Evolving/Moderate complexity    Clinical Decision Making Low    Rehab Potential Good    PT Frequency 2x / week    PT Duration --   6-8 weeks   PT Treatment/Interventions ADLs/Self Care Home Management;Aquatic Therapy;Cryotherapy;Dentist;Therapeutic activities;Therapeutic exercise;Balance  training;Neuromuscular re-education;Patient/family education;Manual techniques;Passive range of motion;Dry needling;Taping;Vasopneumatic Device    PT Next Visit Plan gait training, review HEP, static balance, hip strengthening    PT Home Exercise Plan Access Code: [email or web address]    Recommended Other Services aquatic PT    Consulted and Agree with Plan of Care Patient             Patient will benefit from skilled therapeutic intervention in order to improve the following deficits and impairments:  Abnormal gait, Difficulty walking, Decreased endurance, Decreased activity tolerance, Pain, Improper body mechanics, Impaired flexibility, Decreased balance, Decreased strength  Visit Diagnosis: Chronic pain of left knee  Muscle weakness (generalized)  Difficulty in walking, not elsewhere classified  Other abnormalities of gait and mobility     Problem List Patient Active Problem List   Diagnosis Date Noted   Fatigue 09/23/2020   Morbid obesity with BMI of 50.0-59.9, adult ([place]) 04/28/2020   Knee pain, left 04/23/2020   Diabetes mellitus ([place]) 02/19/2020   Schizoaffective disorder ([place]) 02/19/2020   Vitamin D deficiency 02/19/2020   Low serum vitamin B12 02/19/2020   Low back pain 02/19/2020   Knee pain, right 02/19/2020   BIPOLAR AFFECTIVE DISORDER 03/22/2009   GERD 03/22/2009   Referring diagnosis? Stress fracture of left femur with delayed healing  Treatment diagnosis? (if different than referring diagnosis)  Chronic pain of left knee  Muscle weakness (generalized)  Difficulty in walking, not elsewhere classified  Other abnormalities of gait and mobility  What was this (referring dx) caused by? '[]'$  Surgery '[x]'$  Fall '[]'$  Ongoing issue '[]'$  Arthritis '[]'$  Other: ____________  Laterality: '[]'$  Rt '[x]'$  Lt '[]'$  Both  Check all possible CPT codes:      '[]'$  97110 (Therapeutic Exercise)  '[]'$  92507 (SLP Treatment)  '[]'$  97112 (Neuro Re-ed)   '[]'$  92526 (Swallowing Treatment)   '[]'$  97116  (Gait Training)   '[]'$  [id number] (Cognitive Training, 1st 15 minutes) '[]'$  97140 (Manual Therapy)   '[]'$  97130 (Cognitive Training, each add'l 15 minutes)  '[]'$  97530 (Therapeutic Activities)  '[]'$  Other, List CPT Code ____________    '[]'$  97535 (Self Care)       '[x]'$  All codes above (97110 - 97535)  '[]'$  97012 (Mechanical Traction)  '[x]'$  97014 (E-stim Unattended)  '[]'$  97032 (E-stim manual)  '[]'$  97033 (Ionto)  '[]'$  97035 (Ultrasound)  '[]'$  97760 (Orthotic Fit) '[]'$  [id number] (Physical Performance Training) '[x]'$  [id number] (Aquatic Therapy) '[]'$  97034 (Contrast Bath) '[]'$  [id number] (Paraffin) '[]'$  [id number] (Wound Care 1st  20 sq cm) '[]'$  97598 (Wound Care each add'l 20 sq cm) '[x]'$  97016 (Vasopneumatic Device) '[]'$  [phone] Comptroller) '[]'$  [phone] (Prosthetic Training) Gwendolyn Grant, PT, DPT, ATC 10/10/20 9:25 AM  [facility] [address] [place], [place], [address] Phone: [phone]   Fax:  [phone]  Name: Brenda Hernandez MRN: [id number] Date of Birth: 1975-09-04

## 2020-10-14 ENCOUNTER — Other Ambulatory Visit: Payer: Self-pay

## 2020-10-14 ENCOUNTER — Ambulatory Visit: Payer: Medicare HMO | Attending: Physician Assistant

## 2020-10-14 DIAGNOSIS — M6281 Muscle weakness (generalized): Secondary | ICD-10-CM | POA: Diagnosis not present

## 2020-10-14 DIAGNOSIS — M79604 Pain in right leg: Secondary | ICD-10-CM | POA: Diagnosis not present

## 2020-10-14 DIAGNOSIS — M25562 Pain in left knee: Secondary | ICD-10-CM | POA: Diagnosis not present

## 2020-10-14 DIAGNOSIS — R262 Difficulty in walking, not elsewhere classified: Secondary | ICD-10-CM

## 2020-10-14 DIAGNOSIS — R2689 Other abnormalities of gait and mobility: Secondary | ICD-10-CM | POA: Diagnosis not present

## 2020-10-14 DIAGNOSIS — G8929 Other chronic pain: Secondary | ICD-10-CM | POA: Insufficient documentation

## 2020-10-14 DIAGNOSIS — M25662 Stiffness of left knee, not elsewhere classified: Secondary | ICD-10-CM | POA: Diagnosis not present

## 2020-10-14 DIAGNOSIS — R600 Localized edema: Secondary | ICD-10-CM | POA: Insufficient documentation

## 2020-10-14 DIAGNOSIS — M79605 Pain in left leg: Secondary | ICD-10-CM | POA: Diagnosis not present

## 2020-10-14 NOTE — Therapy (Signed)
[place], [place], [address] Phone: [phone]   Fax:  [phone]  Physical Therapy Treatment  Patient Details  Name: Brenda Hernandez MRN: [id number] Date of Birth: 1975-06-17 Referring Provider (PT): Doren Custard Robb Matar, PA-C   Encounter Date: 10/14/2020   PT End of Session - 10/14/20 0712     Visit Number 2    Number of Visits 17    Date for PT Re-Evaluation 12/07/20    Authorization Type [employer]    Authorization Time Period 7/27-9/30/22    Authorization - Visit Number 1    Authorization - Number of Visits 16    PT Start Time 0715    PT Stop Time 0756    PT Time Calculation (min) 41 min    Equipment Utilized During Treatment Other (comment)   RW   Activity Tolerance Patient tolerated treatment well;Other (comment)   occasional dizziness   Behavior During Therapy WFL for tasks assessed/performed             Past Medical History:  Diagnosis Date   Abnormal pap    Bipolar 1 disorder ([place])    GERD (gastroesophageal reflux disease)    Pre-diabetes    Schizophrenia ([place])    Swallowing difficulty     History reviewed. No pertinent surgical history.  There were no vitals filed for this visit.   Subjective Assessment - 10/14/20 0719     Subjective Patient reports her "normal pain." She reports compliance with HEP.    Pertinent History bipolar, schizophrenia, GERD    Limitations Lifting;Standing;Walking;House hold activities    How long can you sit comfortably? no issue    How long can you stand comfortably? maybe 10 minutes    How long can you walk comfortably? maybe 30-45    Diagnostic tests IMPRESSION:  1. Radial tear of the posterior of the medial meniscus with  peripheral meniscal extrusion.  2. Nondisplaced, nondepressed subchondral insufficiency fractures of  the medial and lateral femoral condyles and medial tibial plateau  with severe surrounding marrow edema.  3. Tricompartmental  cartilage abnormalities as described above.  4. Incompletely visualized fluid collection in the medial aspect of  the distal femoral diaphysis measuring approximately 18 by 10 mm in  axial dimension of indeterminate etiology. This may reflect a  liquified hematoma, but an infectious etiology (abscess) cannot be  excluded. If there is further clinical concern, recommend an MRI of  the left thigh.  5. Large knee joint effusion likely related to the knee pathology  described above. If there is clinical concern regarding septic  arthritis given the soft tissue fluid collection, recommend  arthrocentesis.    Patient Stated Goals I would like to go without my walker    Currently in Pain? Yes    Pain Score 5     Pain Location Knee    Pain Orientation Left    Pain Descriptors / Indicators Aching    Pain Type Chronic pain    Pain Onset More than a month ago    Pain Frequency Intermittent    Aggravating Factors  walking, standing    Pain Relieving Factors rest                               [facility] Adult PT Treatment/Exercise - 10/14/20 0001       Self-Care   Other Self-Care Comments  see patient education      Knee/Hip  Exercises: Stretches   Sports administrator 30 seconds    Brewing technologist; bilaterally      Knee/Hip Exercises: Aerobic   Nustep 5 minutes; LE level 4      Knee/Hip Exercises: Standing   Other Standing Knee Exercises standing weight shift 2 x 10 A/P      Knee/Hip Exercises: Seated   Long Arc Quad 10 reps    Long Arc Quad Weight 1 lbs.    Long [employer] Limitations x2; bilateral    Other Seated Knee/Hip Exercises seated calf raise 2 x 20    Other Seated Knee/Hip Exercises rockerboard A/P 2 x 20    Marching 10 reps    Marching Limitations x2    Marching Weights 1 lbs.    Hamstring Curl 10 reps    Hamstring Limitations green band; x2 bilateral      Knee/Hip Exercises: Supine   Straight Leg Raises 10 reps    Straight Leg Raises Limitations x2;  bilateral      Knee/Hip Exercises: Sidelying   Hip ABduction 10 reps    Hip ABduction Limitations x2; bilateral                    PT Education - 10/14/20 0757     Education Details Updated HEP    Person(s) Educated Patient    Methods Explanation;Demonstration;Verbal cues;Handout    Comprehension Verbalized understanding;Returned demonstration;Verbal cues required              PT Short Term Goals - 10/10/20 0914       PT SHORT TERM GOAL #1   Title Patient will be independent with initial HEP.    Baseline issued at eval.    Time 2    Period Weeks    Status New    Target Date 10/24/20      PT SHORT TERM GOAL #2   Title Therapist will review FOTO score and anticipated functional progress.    Baseline FOTO captured    Time 2    Period Weeks    Status New    Target Date 10/24/20      PT SHORT TERM GOAL #3   Title Patient will complete TUG in less than 20 seconds indicative of improvement in gait speed.    Baseline see flowsheet    Time 4    Period Weeks    Status New    Target Date 11/07/20      PT SHORT TERM GOAL #4   Title Patient will maintain Lt SLS for at least 5 seconds to improve stability necessary to reduce need for AD.    Baseline 1 second    Time 4    Period Weeks    Status New    Target Date 11/07/20               PT Long Term Goals - 10/10/20 0916       PT LONG TERM GOAL #1   Title Patient will demonstrate at least 4/5 bilateral hip abductor and extensor strength to improve stability about the chain with prolonged walking/standing.    Baseline see flowsheet    Time 8    Period Weeks    Status New    Target Date 12/05/20      PT LONG TERM GOAL #2   Title Patient will be able to ascend/descend stairs utilizing reciprocal pattern.    Baseline step to, can only go up/down a few stairs right now  Time 8    Period Weeks    Status New    Target Date 12/05/20      PT LONG TERM GOAL #3   Title Patient will ambulate with  step through pattern utilizing LRAD.    Baseline step to, RW    Time 8    Period Weeks    Status New    Target Date 12/05/20      PT LONG TERM GOAL #4   Title Patient will score at least 56% function on FOTO to signify clinically meaningful improvement in functional abilities.    Baseline 26    Time 8    Period Weeks    Status New    Target Date 12/05/20                   Plan - 10/14/20 0727     Clinical Impression Statement Patient arrives with moderate pain levels about the left knee. Reviewed HEP with patient requiring cues for pacing and proper muscle activation with hip strengthening. She reported discomfort in the hips with SLR and sidelying hip abduction, though when questioned further her discomfort was related to muscle soreness with targeted strengthening. She reported dizziness during sidelying strengthening, that was abolished when she assumed sitting. Dizziness returned in standing with patient admitting to not drinking or eating anything yet today. Gave patient water and peppermint and this helped with her dizziness. Able to continue with seated strengthening with dizziness remaining minimal.    Personal Factors and Comorbidities Comorbidity 3+;Time since onset of injury/illness/exacerbation;Fitness    Comorbidities bipolar, schizophrenia, GERD    Examination-Activity Limitations Locomotion Level;Transfers;Stand;Lift;Bend    Examination-Participation Restrictions Shop    Stability/Clinical Decision Making Evolving/Moderate complexity    Rehab Potential Good    PT Frequency 2x / week    PT Duration --   6-8 weeks   PT Treatment/Interventions ADLs/Self Care Home Management;Aquatic Therapy;Cryotherapy;Dentist;Therapeutic activities;Therapeutic exercise;Balance training;Neuromuscular re-education;Patient/family education;Manual techniques;Passive range of motion;Dry needling;Taping;Vasopneumatic Device    PT Next  Visit Plan gait training, review HEP, static balance, hip strengthening    PT Home Exercise Plan Access Code: [email or web address]    Consulted and Agree with Plan of Care Patient             Patient will benefit from skilled therapeutic intervention in order to improve the following deficits and impairments:  Abnormal gait, Difficulty walking, Decreased endurance, Decreased activity tolerance, Pain, Improper body mechanics, Impaired flexibility, Decreased balance, Decreased strength  Visit Diagnosis: Chronic pain of left knee  Muscle weakness (generalized)  Difficulty in walking, not elsewhere classified  Other abnormalities of gait and mobility     Problem List Patient Active Problem List   Diagnosis Date Noted   Fatigue 09/23/2020   Morbid obesity with BMI of 50.0-59.9, adult ([place]) 04/28/2020   Knee pain, left 04/23/2020   Diabetes mellitus ([place]) 02/19/2020   Schizoaffective disorder ([place]) 02/19/2020   Vitamin D deficiency 02/19/2020   Low serum vitamin B12 02/19/2020   Low back pain 02/19/2020   Knee pain, right 02/19/2020   BIPOLAR AFFECTIVE DISORDER 03/22/2009   GERD 03/22/2009   Gwendolyn Grant, PT, DPT, ATC 10/14/20 7:59 AM   [facility] [address] [place], [place], [address] Phone: [phone]   Fax:  [phone]  Name: Brenda Hernandez MRN: [id number] Date of Birth: 08-21-1975

## 2020-10-16 ENCOUNTER — Encounter (INDEPENDENT_AMBULATORY_CARE_PROVIDER_SITE_OTHER): Payer: Self-pay | Admitting: Family Medicine

## 2020-10-16 ENCOUNTER — Other Ambulatory Visit: Payer: Self-pay

## 2020-10-16 ENCOUNTER — Ambulatory Visit (INDEPENDENT_AMBULATORY_CARE_PROVIDER_SITE_OTHER): Payer: Medicare HMO | Admitting: Family Medicine

## 2020-10-16 VITALS — BP 110/77 | HR 99 | Temp 97.6°F | Ht 63.0 in | Wt 263.0 lb

## 2020-10-16 DIAGNOSIS — E1169 Type 2 diabetes mellitus with other specified complication: Secondary | ICD-10-CM

## 2020-10-16 DIAGNOSIS — E559 Vitamin D deficiency, unspecified: Secondary | ICD-10-CM

## 2020-10-16 DIAGNOSIS — Z6841 Body Mass Index (BMI) 40.0 and over, adult: Secondary | ICD-10-CM

## 2020-10-17 NOTE — Progress Notes (Signed)
Chief Complaint:   OBESITY Sahalie is here to discuss her progress with her obesity treatment plan along with follow-up of her obesity related diagnoses. Aniella is on the Category 3 Plan and states she is following her eating plan approximately 50% of the time. Tajanay states she is doing 0 minutes 0 times per week.  Today's visit was #: 12 Starting weight: 287 lbs Starting date: 03/26/2020 Today's weight: 263 lbs Today's date: 10/16/2020 Total lbs lost to date: 24 Total lbs lost since last in-office visit: 0  Interim History: Dorathea is surprised at her weight gain but it is noted on food recall that she significantly deviates from the cat 3 plan.  She tends to have cereal (Cheerios or Cinnamon [employer]) for breakfast. She has a omelet with ham at lunch. She has a microwave meal at dinner. She snacks on tortilla with cheese. When she goes out she has sweet tea 1-2 times per week or soda.  Subjective:   1. Vitamin D deficiency Ayrianna's last Vit D level was at goal, but it was approaching over-replacement. Her Vit D level was 91 in May 2022. She is on 4,000 IU OTC daily and weekly prescription Vit D which is not on the medication list.  Lab Results  Component Value Date   VD25OH 91.02 08/03/2020   VD25OH 68.8 07/18/2020   VD25OH 33.0 03/26/2020   2. Type 2 diabetes mellitus with other specified complication, without long-term current use of insulin (HCC) Anushka's diabetes is well controlled. Last A1c was 5.7. She is on metformin 1,000 mg BID.  Lab Results  Component Value Date   HGBA1C 5.7 (H) 08/03/2020   HGBA1C 5.6 07/18/2020   HGBA1C 5.8 (H) 03/26/2020   Lab Results  Component Value Date   LDLCALC 117 (H) 08/03/2020   CREATININE 0.74 08/03/2020   Lab Results  Component Value Date   INSULIN 10.8 07/18/2020   INSULIN 24.7 03/26/2020   Assessment/Plan:   1. Vitamin D deficiency  Brogan agreed to discontinue all Vit D supplementations. We will recheck her  Vit D level at her next office visit.   2. Type 2 diabetes mellitus with other specified complication, without long-term current use of insulin ([place]) Lavene will continue metformin.   3. Obesity: Current BMI 46.6 Cleo is currently in the action stage of change. As such, her goal is to continue with weight loss efforts. She has agreed to the Category 3 Plan.  Quintasha may have diet juice (5 calories in 8 oz). She may have Kodiac or Quaker Oats protein oatmeal.  Exercise goals: No exercise has been prescribed at this time.  Behavioral modification strategies: increasing lean protein intake, decreasing simple carbohydrates, decreasing liquid calories, and meal planning and cooking strategies.  Meshawn has agreed to follow-up with our clinic in 2 to 3 weeks with Abby Potash, PA-C.   Objective:   Blood pressure 110/77, pulse 99, temperature 97.6 F (36.4 C), height '5\' 3"'$  (1.6 m), weight 263 lb (119.3 kg), SpO2 98 %. Body mass index is 46.59 kg/m.  General: Cooperative, alert, well developed, in no acute distress. HEENT: Conjunctivae and lids unremarkable. Cardiovascular: Regular rhythm.  Lungs: Normal work of breathing. Neurologic: No focal deficits.   Lab Results  Component Value Date   CREATININE 0.74 08/03/2020   BUN 10 08/03/2020   NA 140 08/03/2020   K 3.8 08/03/2020   CL 107 08/03/2020   CO2 25 08/03/2020   Lab Results  Component Value Date  ALT 13 08/03/2020   AST 18 08/03/2020   ALKPHOS 60 08/03/2020   BILITOT 0.5 08/03/2020   Lab Results  Component Value Date   HGBA1C 5.7 (H) 08/03/2020   HGBA1C 5.6 07/18/2020   HGBA1C 5.8 (H) 03/26/2020   HGBA1C 5.4 02/03/2020   HGBA1C 5.5 07/29/2019   Lab Results  Component Value Date   INSULIN 10.8 07/18/2020   INSULIN 24.7 03/26/2020   Lab Results  Component Value Date   TSH 1.55 09/23/2020   Lab Results  Component Value Date   CHOL 194 08/03/2020   HDL 57 08/03/2020   LDLCALC 117 (H) 08/03/2020   TRIG  100 08/03/2020   CHOLHDL 3.4 08/03/2020   Lab Results  Component Value Date   VD25OH 91.02 08/03/2020   VD25OH 68.8 07/18/2020   VD25OH 33.0 03/26/2020   Lab Results  Component Value Date   WBC 7.9 09/27/2020   HGB 12.2 09/27/2020   HCT 36.7 09/27/2020   MCV 90.8 09/27/2020   PLT 284 09/27/2020   No results found for: IRON, TIBC, FERRITIN  Obesity Behavioral Intervention:   Approximately 15 minutes were spent on the discussion below.  ASK: We discussed the diagnosis of obesity with Samariya today and Paolina agreed to give [place] permission to discuss obesity behavioral modification therapy today.  ASSESS: Cindy has the diagnosis of obesity and her BMI today is 46.6. Elonna is in the action stage of change.   ADVISE: Arleane was educated on the multiple health risks of obesity as well as the benefit of weight loss to improve her health. She was advised of the need for long term treatment and the importance of lifestyle modifications to improve her current health and to decrease her risk of future health problems.  AGREE: Multiple dietary modification options and treatment options were discussed and Tahara agreed to follow the recommendations documented in the above note.  ARRANGE: Normajean was educated on the importance of frequent visits to treat obesity as outlined per CMS and USPSTF guidelines and agreed to schedule her next follow up appointment today.  Attestation Statements:   Reviewed by clinician on day of visit: allergies, medications, problem list, medical history, surgical history, family history, social history, and previous encounter notes.   [name], am acting as Location manager for [employer], FNP-C.  I have reviewed the above documentation for accuracy and completeness, and I agree with the above. -  Georgianne Fick, FNP

## 2020-10-21 ENCOUNTER — Other Ambulatory Visit: Payer: Self-pay

## 2020-10-21 ENCOUNTER — Ambulatory Visit: Payer: Medicare HMO

## 2020-10-21 DIAGNOSIS — G8929 Other chronic pain: Secondary | ICD-10-CM | POA: Diagnosis not present

## 2020-10-21 DIAGNOSIS — M25662 Stiffness of left knee, not elsewhere classified: Secondary | ICD-10-CM | POA: Diagnosis not present

## 2020-10-21 DIAGNOSIS — M6281 Muscle weakness (generalized): Secondary | ICD-10-CM

## 2020-10-21 DIAGNOSIS — R2689 Other abnormalities of gait and mobility: Secondary | ICD-10-CM | POA: Diagnosis not present

## 2020-10-21 DIAGNOSIS — R600 Localized edema: Secondary | ICD-10-CM | POA: Diagnosis not present

## 2020-10-21 DIAGNOSIS — R262 Difficulty in walking, not elsewhere classified: Secondary | ICD-10-CM | POA: Diagnosis not present

## 2020-10-21 DIAGNOSIS — M25562 Pain in left knee: Secondary | ICD-10-CM | POA: Diagnosis not present

## 2020-10-21 NOTE — Therapy (Signed)
[place], [place], [address] Phone: [phone]   Fax:  [phone]  Physical Therapy Treatment  Patient Details  Name: Brenda Hernandez MRN: [id number] Date of Birth: 06-20-1975 Referring Provider (PT): Cline Crock, PA-C   Encounter Date: 10/21/2020   PT End of Session - 10/21/20 1548     Visit Number 3    Number of Visits 17    Date for PT Re-Evaluation 12/07/20    Authorization Type [employer]    Authorization Time Period 7/27-9/30/22    Authorization - Visit Number 2    Authorization - Number of Visits 16    PT Start Time [id number]    PT Stop Time 1630    PT Time Calculation (min) 42 min    Equipment Utilized During Treatment Other (comment)   RW   Activity Tolerance Patient tolerated treatment well    Behavior During Therapy WFL for tasks assessed/performed             Past Medical History:  Diagnosis Date   Abnormal pap    Bipolar 1 disorder ([place])    GERD (gastroesophageal reflux disease)    Pre-diabetes    Schizophrenia ([place])    Swallowing difficulty     History reviewed. No pertinent surgical history.  There were no vitals filed for this visit.   Subjective Assessment - 10/21/20 1553     Subjective She reports more pain in the knee since Sunday, but is unsure why. She reports compliance with HEP.    Pertinent History bipolar, schizophrenia, GERD    Limitations Lifting;Standing;Walking;House hold activities    How long can you sit comfortably? no issue    How long can you stand comfortably? maybe 10 minutes    How long can you walk comfortably? maybe 30-45    Diagnostic tests IMPRESSION:  1. Radial tear of the posterior of the medial meniscus with  peripheral meniscal extrusion.  2. Nondisplaced, nondepressed subchondral insufficiency fractures of  the medial and lateral femoral condyles and medial tibial plateau  with severe surrounding marrow edema.  3. Tricompartmental cartilage  abnormalities as described above.  4. Incompletely visualized fluid collection in the medial aspect of  the distal femoral diaphysis measuring approximately 18 by 10 mm in  axial dimension of indeterminate etiology. This may reflect a  liquified hematoma, but an infectious etiology (abscess) cannot be  excluded. If there is further clinical concern, recommend an MRI of  the left thigh.  5. Large knee joint effusion likely related to the knee pathology  described above. If there is clinical concern regarding septic  arthritis given the soft tissue fluid collection, recommend  arthrocentesis.    Patient Stated Goals I would like to go without my walker    Currently in Pain? Yes    Pain Score 5     Pain Location Knee    Pain Orientation Left;Anterior    Pain Descriptors / Indicators Aching    Pain Type Chronic pain    Pain Onset More than a month ago    Pain Frequency Intermittent                               [facility] Adult PT Treatment/Exercise - 10/21/20 0001       Ambulation/Gait   Ambulation/Gait Yes    Assistive device Parallel bars    Gait Comments focusing on heel strike and push-off, and step through pattern  Self-Care   Other Self-Care Comments  see patient education      Knee/Hip Exercises: Standing   Hip Abduction 10 reps    Abduction Limitations x2 LLE; unable to complete standing on the LLE due to pain    Other Standing Knee Exercises heel/toe raise 2  x10    Other Standing Knee Exercises standing march at //bars 2 x 10 with single UE support      Knee/Hip Exercises: Seated   Long Arc Quad 10 reps    Long Arc Quad Weight 1 lbs.    Long [employer] Limitations x2; bilateral      Knee/Hip Exercises: Supine   Bridges 10 reps    Bridges Limitations x2; partial range      Knee/Hip Exercises: Sidelying   Clams 2 x 10 bilateral, partial range      Knee/Hip Exercises: Prone   Hip Extension 10 reps    Hip Extension Limitations x2 bilaterally; partial  range                    PT Education - 10/21/20 1634     Education Details Updated HEP.    Person(s) Educated Patient    Methods Explanation;Verbal cues;Handout    Comprehension Verbalized understanding;Returned demonstration;Verbal cues required              PT Short Term Goals - 10/10/20 0914       PT SHORT TERM GOAL #1   Title Patient will be independent with initial HEP.    Baseline issued at eval.    Time 2    Period Weeks    Status New    Target Date 10/24/20      PT SHORT TERM GOAL #2   Title Therapist will review FOTO score and anticipated functional progress.    Baseline FOTO captured    Time 2    Period Weeks    Status New    Target Date 10/24/20      PT SHORT TERM GOAL #3   Title Patient will complete TUG in less than 20 seconds indicative of improvement in gait speed.    Baseline see flowsheet    Time 4    Period Weeks    Status New    Target Date 11/07/20      PT SHORT TERM GOAL #4   Title Patient will maintain Lt SLS for at least 5 seconds to improve stability necessary to reduce need for AD.    Baseline 1 second    Time 4    Period Weeks    Status New    Target Date 11/07/20               PT Long Term Goals - 10/10/20 0916       PT LONG TERM GOAL #1   Title Patient will demonstrate at least 4/5 bilateral hip abductor and extensor strength to improve stability about the chain with prolonged walking/standing.    Baseline see flowsheet    Time 8    Period Weeks    Status New    Target Date 12/05/20      PT LONG TERM GOAL #2   Title Patient will be able to ascend/descend stairs utilizing reciprocal pattern.    Baseline step to, can only go up/down a few stairs right now    Time 8    Period Weeks    Status New    Target Date 12/05/20      PT  LONG TERM GOAL #3   Title Patient will ambulate with step through pattern utilizing LRAD.    Baseline step to, RW    Time 8    Period Weeks    Status New    Target Date  12/05/20      PT LONG TERM GOAL #4   Title Patient will score at least 56% function on FOTO to signify clinically meaningful improvement in functional abilities.    Baseline 26    Time 8    Period Weeks    Status New    Target Date 12/05/20                   Plan - 10/21/20 1557     Clinical Impression Statement Patient arrives with continued reports of moderate left knee pain. Continued with BLE strengthening with patient able to only complete partial range of targeted hip abductor and extensor strengthening bilaterally due to weakness. Began gait taining in parallel bars focusing on heel strike and push-off on the LLE as well as step through gait pattern with patient requiring consistent cues for push-off. Even with continued practice she is unable to demo step through gait pattern at this time. She tolerated ther ex well today with exception of standing hip abduction when weightbearing on the LLE as this caused increased knee pain, so this exercise was discontinued.    Personal Factors and Comorbidities Comorbidity 3+;Time since onset of injury/illness/exacerbation;Fitness    Comorbidities bipolar, schizophrenia, GERD    Examination-Activity Limitations Locomotion Level;Transfers;Stand;Lift;Bend    Examination-Participation Restrictions Shop    Stability/Clinical Decision Making Evolving/Moderate complexity    Rehab Potential --    PT Frequency --    PT Duration --    PT Treatment/Interventions ADLs/Self Care Home Management;Aquatic Therapy;Cryotherapy;Dentist;Therapeutic activities;Therapeutic exercise;Balance training;Neuromuscular re-education;Patient/family education;Manual techniques;Passive range of motion;Dry needling;Taping;Vasopneumatic Device    PT Next Visit Plan gait training, review HEP, static balance, hip strengthening    PT Home Exercise Plan Access Code: [email or web address]    Consulted and Agree with Plan of Care  Patient             Patient will benefit from skilled therapeutic intervention in order to improve the following deficits and impairments:  Abnormal gait, Difficulty walking, Decreased endurance, Decreased activity tolerance, Pain, Improper body mechanics, Impaired flexibility, Decreased balance, Decreased strength  Visit Diagnosis: Chronic pain of left knee  Muscle weakness (generalized)  Difficulty in walking, not elsewhere classified  Other abnormalities of gait and mobility     Problem List Patient Active Problem List   Diagnosis Date Noted   Fatigue 09/23/2020   Class 3 severe obesity with serious comorbidity and body mass index (BMI) of 50.0 to 59.9 in adult ([place]) 04/28/2020   Knee pain, left 04/23/2020   Diabetes mellitus ([place]) 02/19/2020   Schizoaffective disorder ([place]) 02/19/2020   Vitamin D deficiency 02/19/2020   Low serum vitamin B12 02/19/2020   Low back pain 02/19/2020   Knee pain, right 02/19/2020   BIPOLAR AFFECTIVE DISORDER 03/22/2009   GERD 03/22/2009   Gwendolyn Grant, PT, DPT, ATC 10/21/20 4:37 PM  [facility] [address] [place], [place], [address] Phone: [phone]   Fax:  [phone]  Name: Brenda Hernandez MRN: [id number] Date of Birth: 02-Apr-1975

## 2020-10-23 ENCOUNTER — Ambulatory Visit (HOSPITAL_BASED_OUTPATIENT_CLINIC_OR_DEPARTMENT_OTHER): Payer: Medicare HMO | Attending: Physician Assistant | Admitting: Physical Therapy

## 2020-10-23 ENCOUNTER — Encounter (HOSPITAL_BASED_OUTPATIENT_CLINIC_OR_DEPARTMENT_OTHER): Payer: Self-pay | Admitting: Physical Therapy

## 2020-10-23 ENCOUNTER — Other Ambulatory Visit: Payer: Self-pay

## 2020-10-23 DIAGNOSIS — M6281 Muscle weakness (generalized): Secondary | ICD-10-CM | POA: Diagnosis not present

## 2020-10-23 DIAGNOSIS — R262 Difficulty in walking, not elsewhere classified: Secondary | ICD-10-CM | POA: Insufficient documentation

## 2020-10-23 DIAGNOSIS — M25562 Pain in left knee: Secondary | ICD-10-CM | POA: Insufficient documentation

## 2020-10-23 DIAGNOSIS — R2689 Other abnormalities of gait and mobility: Secondary | ICD-10-CM | POA: Insufficient documentation

## 2020-10-23 DIAGNOSIS — M25662 Stiffness of left knee, not elsewhere classified: Secondary | ICD-10-CM

## 2020-10-23 DIAGNOSIS — R600 Localized edema: Secondary | ICD-10-CM

## 2020-10-23 DIAGNOSIS — G8929 Other chronic pain: Secondary | ICD-10-CM | POA: Insufficient documentation

## 2020-10-23 NOTE — Therapy (Signed)
[place], [place], [address] Phone: [phone]   Fax:  [phone]  Physical Therapy Treatment  Patient Details  Name: Brenda Hernandez MRN: [id number] Date of Birth: 06-Oct-1975 Referring Provider (PT): Doren Custard Robb Matar, PA-C   Encounter Date: 10/23/2020   PT End of Session - 10/23/20 1745     Visit Number 4    Number of Visits 17    Date for PT Re-Evaluation 12/07/20    PT Start Time [id number]    PT Stop Time 1715    PT Time Calculation (min) 40 min    Equipment Utilized During Treatment Other (comment)   Ankle cuff and waist buoys, kick board, noodle/sqoodle, nekdoodle, weights and barbells, water walker   Activity Tolerance Patient tolerated treatment well    Behavior During Therapy WFL for tasks assessed/performed             Past Medical History:  Diagnosis Date   Abnormal pap    Bipolar 1 disorder ([place])    GERD (gastroesophageal reflux disease)    Pre-diabetes    Schizophrenia ([place])    Swallowing difficulty     History reviewed. No pertinent surgical history.  There were no vitals filed for this visit.   Subjective Assessment - 10/23/20 1743     Subjective "Knee not hurting bad today"    Currently in Pain? Yes    Pain Score 4     Pain Orientation Left;Anterior    Pain Descriptors / Indicators Aching    Pain Type Chronic pain    Pain Frequency Intermittent                                       PT Education - 10/23/20 1744     Education Details Properties of water; benefits of aquatic therapy    Person(s) Educated Patient    Methods Explanation    Comprehension Verbalized understanding              PT Short Term Goals - 10/10/20 0914       PT SHORT TERM GOAL #1   Title Patient will be independent with initial HEP.    Baseline issued at eval.    Time 2    Period Weeks    Status New    Target Date 10/24/20      PT SHORT TERM GOAL  #2   Title Therapist will review FOTO score and anticipated functional progress.    Baseline FOTO captured    Time 2    Period Weeks    Status New    Target Date 10/24/20      PT SHORT TERM GOAL #3   Title Patient will complete TUG in less than 20 seconds indicative of improvement in gait speed.    Baseline see flowsheet    Time 4    Period Weeks    Status New    Target Date 11/07/20      PT SHORT TERM GOAL #4   Title Patient will maintain Lt SLS for at least 5 seconds to improve stability necessary to reduce need for AD.    Baseline 1 second    Time 4    Period Weeks    Status New    Target Date 11/07/20               PT Long Term Goals - 10/10/20 [id number]  PT LONG TERM GOAL #1   Title Patient will demonstrate at least 4/5 bilateral hip abductor and extensor strength to improve stability about the chain with prolonged walking/standing.    Baseline see flowsheet    Time 8    Period Weeks    Status New    Target Date 12/05/20      PT LONG TERM GOAL #2   Title Patient will be able to ascend/descend stairs utilizing reciprocal pattern.    Baseline step to, can only go up/down a few stairs right now    Time 8    Period Weeks    Status New    Target Date 12/05/20      PT LONG TERM GOAL #3   Title Patient will ambulate with step through pattern utilizing LRAD.    Baseline step to, RW    Time 8    Period Weeks    Status New    Target Date 12/05/20      PT LONG TERM GOAL #4   Title Patient will score at least 56% function on FOTO to signify clinically meaningful improvement in functional abilities.    Baseline 26    Time 8    Period Weeks    Status New    Target Date 12/05/20           Pt seen for aquatic therapy today.  Treatment took place in water 3.25-4.8 ft in depth at the [employer] pool. Temp of water was 91.  Pt entered/exited the pool via stairs step to pattern independently with bilat rail. Warm up: forward, backward and side  stepping/walking cues for increased step length, increased speed, hand placement to increase resistance.  Seated Hamstring, gastroc and add stretching 3-4 x 20 sec hold Knee flex/ext 2 x 10 reps vc for increased speed. Completes without discomfort Flutter kicking x 20 seconds x 5.    Standing Stretching using ankle cuffs and noodle: hip flexors and quad x 20 sec Holding to wall, progressing to noodle for core activation and balance challenges as well as LE strengthening: df, PF, marching, add/abd, noodle kickdowns, hip extension and flex 2 x 10 reps Jogging in place 20 seconds x 3. Manual asst and demonstration for proper tech and execution. Pt with some incoordination/difficuly with time on task. Step ups leading with each LE x 10 reps.  Gait training 6 widths of pool x3 holding noodle and 3 without.  No LOB. VC for increased step length, challenging increasing speed to encouraged step through pattern    Pt requires buoyancy for support and to offload joints with strengthening exercises. Viscosity of the water is needed for resistance of strengthening; water current perturbations provides challenge to standing balance unsupported, requiring increased core activation.         Plan - 10/23/20 1748     Clinical Impression Statement Pt comfortable in aquatic setting. Reports of "some" knee pain. Pt introduced to aquatic setting. She is directed through stretching and strengthening.  Progressed focus on hip abductors and extensors.  Pt requiring continuous VC and TC for proper execution, positioning and time on task. Gait training using yellow noodle as support, 6 widths of pool with ability to complelte and sustain step through pattern. Cuing for "long step" facilitated proper gait pattern. Pt with c/o min knee discomfort upon completion.Noted bilat ankle edema pitted +1    Personal Factors and Comorbidities Comorbidity 3+;Time since onset of injury/illness/exacerbation;Fitness    PT  Treatment/Interventions ADLs/Self Care Home Management;Aquatic Therapy;Cryotherapy;Dealer  Stimulation;Moist Heat;Gait Scientist, forensic;Therapeutic activities;Therapeutic exercise;Balance training;Neuromuscular re-education;Patient/family education;Manual techniques;Passive range of motion;Dry needling;Taping;Vasopneumatic Device    PT Next Visit Plan progress hip ROM and strengtheing, balance    PT Home Exercise Plan Access Code: [email or web address]             Patient will benefit from skilled therapeutic intervention in order to improve the following deficits and impairments:  Abnormal gait, Difficulty walking, Decreased endurance, Decreased activity tolerance, Pain, Improper body mechanics, Impaired flexibility, Decreased balance, Decreased strength  Visit Diagnosis: Chronic pain of left knee  Muscle weakness (generalized)  Difficulty in walking, not elsewhere classified  Other abnormalities of gait and mobility  Acute pain of left knee  Localized edema  Stiffness of left knee, not elsewhere classified     Problem List Patient Active Problem List   Diagnosis Date Noted   Fatigue 09/23/2020   Class 3 severe obesity with serious comorbidity and body mass index (BMI) of 50.0 to 59.9 in adult ([place]) 04/28/2020   Knee pain, left 04/23/2020   Diabetes mellitus ([place]) 02/19/2020   Schizoaffective disorder ([place]) 02/19/2020   Vitamin D deficiency 02/19/2020   Low serum vitamin B12 02/19/2020   Low back pain 02/19/2020   Knee pain, right 02/19/2020   BIPOLAR AFFECTIVE DISORDER 03/22/2009   GERD 03/22/2009    Vedia Pereyra  MPT 10/23/2020, 5:58 PM  [facility] [address] [place], [place], [address] Phone: [phone]   Fax:  [phone]  Name: Brenda Hernandez MRN: [id number] Date of Birth: 09/03/1975

## 2020-10-28 ENCOUNTER — Ambulatory Visit: Payer: Medicare HMO

## 2020-10-28 ENCOUNTER — Other Ambulatory Visit: Payer: Self-pay

## 2020-10-28 DIAGNOSIS — R262 Difficulty in walking, not elsewhere classified: Secondary | ICD-10-CM | POA: Diagnosis not present

## 2020-10-28 DIAGNOSIS — R2689 Other abnormalities of gait and mobility: Secondary | ICD-10-CM | POA: Diagnosis not present

## 2020-10-28 DIAGNOSIS — G8929 Other chronic pain: Secondary | ICD-10-CM

## 2020-10-28 DIAGNOSIS — M6281 Muscle weakness (generalized): Secondary | ICD-10-CM | POA: Diagnosis not present

## 2020-10-28 DIAGNOSIS — M25562 Pain in left knee: Secondary | ICD-10-CM | POA: Diagnosis not present

## 2020-10-28 DIAGNOSIS — M25662 Stiffness of left knee, not elsewhere classified: Secondary | ICD-10-CM | POA: Diagnosis not present

## 2020-10-28 DIAGNOSIS — R600 Localized edema: Secondary | ICD-10-CM | POA: Diagnosis not present

## 2020-10-28 NOTE — Therapy (Signed)
[facility] [place], [place], [address] Phone: [phone]   Fax:  [phone]  Physical Therapy Treatment  Patient Details  Name: Brenda Hernandez MRN: [id number] Date of Birth: 17-Nov-1975 Referring Provider (PT): Doren Custard Robb Matar, PA-C   Encounter Date: 10/28/2020   PT End of Session - 10/28/20 1505     Visit Number 5    Number of Visits 17    Date for PT Re-Evaluation 12/07/20    Authorization Type [employer]    Authorization Time Period 7/27-9/30/22    Authorization - Visit Number 4    Authorization - Number of Visits 16    PT Start Time 1503    PT Stop Time 1543    PT Time Calculation (min) 40 min    Activity Tolerance Patient tolerated treatment well    Behavior During Therapy [facility] for tasks assessed/performed             Past Medical History:  Diagnosis Date   Abnormal pap    Bipolar 1 disorder ([place])    GERD (gastroesophageal reflux disease)    Pre-diabetes    Schizophrenia ([place])    Swallowing difficulty     History reviewed. No pertinent surgical history.  There were no vitals filed for this visit.   Subjective Assessment - 10/28/20 1506     Subjective She reports the knee is feeling ok. She reports compliance with HEP. She felt that aquatic therapy was helpful.    Currently in Pain? Yes    Pain Score 3     Pain Location Knee    Pain Orientation Left;Anterior    Pain Descriptors / Indicators Discomfort    Pain Type Chronic pain    Pain Onset More than a month ago    Pain Frequency Intermittent    Aggravating Factors  walking, standing    Pain Relieving Factors rest                [facility] PT Assessment - 10/28/20 0001       Timed Up and Go Test   Normal TUG (seconds) 13.5   with RW                          [facility] Adult PT Treatment/Exercise - 10/28/20 0001       Self-Care   Other Self-Care Comments  see patient education      Neuro Re-ed    Neuro Re-ed  Details  tandem balance 2 x 30 sec, romberg on foam 1  x 30 sec, romberg on foam eyes closed 2 x 30 sec      Knee/Hip Exercises: Aerobic   Nustep 5 minutes level 4 UE/LE      Knee/Hip Exercises: Machines for Strengthening   Total Gym Leg Press 2 x 10 @ 40 lbs      Knee/Hip Exercises: Standing   Heel Raises 10 reps    Heel Raises Limitations x2      Knee/Hip Exercises: Seated   Hamstring Curl 10 reps    Hamstring Limitations blue band x2 bilateral                    PT Education - 10/28/20 1544     Education Details Reviewed FOTO and anticipated progress. Updated HEP.    Person(s) Educated Patient    Methods Explanation;Demonstration;Verbal cues;Handout    Comprehension Verbalized understanding;Returned demonstration;Verbal cues required  PT Short Term Goals - 10/28/20 1525       PT SHORT TERM GOAL #1   Title Patient will be independent with initial HEP.    Baseline issued at eval.    Time 2    Period Weeks    Status Achieved    Target Date 10/24/20      PT SHORT TERM GOAL #2   Title Therapist will review FOTO score and anticipated functional progress.    Baseline FOTO captured; reviewed 8/15    Time 2    Period Weeks    Status Achieved    Target Date 10/24/20      PT SHORT TERM GOAL #3   Title Patient will complete TUG in less than 20 seconds indicative of improvement in gait speed.    Baseline see flowsheet    Time 4    Period Weeks    Status Achieved    Target Date 11/07/20      PT SHORT TERM GOAL #4   Title Patient will maintain Lt SLS for at least 5 seconds to improve stability necessary to reduce need for AD.    Baseline 1 second    Time 4    Period Weeks    Status Deferred    Target Date 11/07/20               PT Long Term Goals - 10/10/20 0916       PT LONG TERM GOAL #1   Title Patient will demonstrate at least 4/5 bilateral hip abductor and extensor strength to improve stability about the chain with prolonged  walking/standing.    Baseline see flowsheet    Time 8    Period Weeks    Status New    Target Date 12/05/20      PT LONG TERM GOAL #2   Title Patient will be able to ascend/descend stairs utilizing reciprocal pattern.    Baseline step to, can only go up/down a few stairs right now    Time 8    Period Weeks    Status New    Target Date 12/05/20      PT LONG TERM GOAL #3   Title Patient will ambulate with step through pattern utilizing LRAD.    Baseline step to, RW    Time 8    Period Weeks    Status New    Target Date 12/05/20      PT LONG TERM GOAL #4   Title Patient will score at least 56% function on FOTO to signify clinically meaningful improvement in functional abilities.    Baseline 26    Time 8    Period Weeks    Status New    Target Date 12/05/20                   Plan - 10/28/20 1522     Clinical Impression Statement Patient's TUG has significantly improved compared to baseline having met this short term functional goal. Able to introduce static balance activity with patient challenged in maintaining tandem stance bilaterally. Able to complete leg press with patient demonstrating good LE alignment and no reports of increased pain. Overall she tolerated balance and progression of strengthening well today reporting minor pain during tandem with LLE rear, otherwise no complaints. She did fatigue quickly with standing activity requiring frequent seated rest breaks between sets.    Personal Factors and Comorbidities Comorbidity 3+;Time since onset of injury/illness/exacerbation;Fitness    PT Treatment/Interventions ADLs/Self Care  Home Management;Aquatic Therapy;Cryotherapy;Dentist;Therapeutic activities;Therapeutic exercise;Balance training;Neuromuscular re-education;Patient/family education;Manual techniques;Passive range of motion;Dry needling;Taping;Vasopneumatic Device    PT Next Visit Plan FOTO, BLE  strengthening, static/dynamic balance    PT Home Exercise Plan Access Code: [email or web address]    Consulted and Agree with Plan of Care Patient             Patient will benefit from skilled therapeutic intervention in order to improve the following deficits and impairments:  Abnormal gait, Difficulty walking, Decreased endurance, Decreased activity tolerance, Pain, Improper body mechanics, Impaired flexibility, Decreased balance, Decreased strength  Visit Diagnosis: Chronic pain of left knee  Muscle weakness (generalized)  Difficulty in walking, not elsewhere classified  Other abnormalities of gait and mobility     Problem List Patient Active Problem List   Diagnosis Date Noted   Fatigue 09/23/2020   Class 3 severe obesity with serious comorbidity and body mass index (BMI) of 50.0 to 59.9 in adult ([place]) 04/28/2020   Knee pain, left 04/23/2020   Diabetes mellitus ([place]) 02/19/2020   Schizoaffective disorder ([place]) 02/19/2020   Vitamin D deficiency 02/19/2020   Low serum vitamin B12 02/19/2020   Low back pain 02/19/2020   Knee pain, right 02/19/2020   BIPOLAR AFFECTIVE DISORDER 03/22/2009   GERD 03/22/2009   Gwendolyn Grant, PT, DPT, ATC 10/28/20 3:48 PM   [facility] [address] [place], [place], [address] Phone: [phone]   Fax:  [phone]  Name: Brenda Hernandez MRN: [id number] Date of Birth: 05-26-1975

## 2020-10-30 ENCOUNTER — Other Ambulatory Visit: Payer: Self-pay

## 2020-10-30 ENCOUNTER — Ambulatory Visit (HOSPITAL_BASED_OUTPATIENT_CLINIC_OR_DEPARTMENT_OTHER): Payer: Medicare HMO | Admitting: Physical Therapy

## 2020-10-30 ENCOUNTER — Encounter (HOSPITAL_BASED_OUTPATIENT_CLINIC_OR_DEPARTMENT_OTHER): Payer: Self-pay | Admitting: Physical Therapy

## 2020-10-30 DIAGNOSIS — M6281 Muscle weakness (generalized): Secondary | ICD-10-CM | POA: Diagnosis not present

## 2020-10-30 DIAGNOSIS — R600 Localized edema: Secondary | ICD-10-CM

## 2020-10-30 DIAGNOSIS — G8929 Other chronic pain: Secondary | ICD-10-CM

## 2020-10-30 DIAGNOSIS — M25562 Pain in left knee: Secondary | ICD-10-CM

## 2020-10-30 DIAGNOSIS — R2689 Other abnormalities of gait and mobility: Secondary | ICD-10-CM | POA: Diagnosis not present

## 2020-10-30 DIAGNOSIS — M25662 Stiffness of left knee, not elsewhere classified: Secondary | ICD-10-CM

## 2020-10-30 DIAGNOSIS — R262 Difficulty in walking, not elsewhere classified: Secondary | ICD-10-CM

## 2020-10-30 NOTE — Therapy (Signed)
[facility] [place], [place], [address] Phone: [phone]   Fax:  [phone]  Physical Therapy Treatment  Patient Details  Name: Brenda Hernandez MRN: [id number] Date of Birth: August 18, 1975 Referring Provider (PT): Cline Crock, PA-C   Encounter Date: 10/30/2020   PT End of Session - 10/30/20 1648     Visit Number 6    Number of Visits 17    Date for PT Re-Evaluation 12/07/20    Authorization Type [employer]    Authorization Time Period 7/27-9/30/22    PT Start Time 1634    PT Stop Time 1712    PT Time Calculation (min) 38 min    Equipment Utilized During Treatment Other (comment)    Activity Tolerance Patient tolerated treatment well             Past Medical History:  Diagnosis Date   Abnormal pap    Bipolar 1 disorder ([place])    GERD (gastroesophageal reflux disease)    Pre-diabetes    Schizophrenia ([place])    Swallowing difficulty     History reviewed. No pertinent surgical history.  There were no vitals filed for this visit.   Subjective Assessment - 10/30/20 1823     Subjective "Iv'e been looking forward to this all week"                                         PT Short Term Goals - 10/28/20 1525       PT SHORT TERM GOAL #1   Title Patient will be independent with initial HEP.    Baseline issued at eval.    Time 2    Period Weeks    Status Achieved    Target Date 10/24/20      PT SHORT TERM GOAL #2   Title Therapist will review FOTO score and anticipated functional progress.    Baseline FOTO captured; reviewed 8/15    Time 2    Period Weeks    Status Achieved    Target Date 10/24/20      PT SHORT TERM GOAL #3   Title Patient will complete TUG in less than 20 seconds indicative of improvement in gait speed.    Baseline see flowsheet    Time 4    Period Weeks    Status Achieved    Target Date 11/07/20      PT SHORT TERM GOAL #4   Title  Patient will maintain Lt SLS for at least 5 seconds to improve stability necessary to reduce need for AD.    Baseline 1 second    Time 4    Period Weeks    Status Deferred    Target Date 11/07/20               PT Long Term Goals - 10/10/20 0916       PT LONG TERM GOAL #1   Title Patient will demonstrate at least 4/5 bilateral hip abductor and extensor strength to improve stability about the chain with prolonged walking/standing.    Baseline see flowsheet    Time 8    Period Weeks    Status New    Target Date 12/05/20      PT LONG TERM GOAL #2   Title Patient will be able to ascend/descend stairs utilizing reciprocal pattern.    Baseline step to, can only go  up/down a few stairs right now    Time 8    Period Weeks    Status New    Target Date 12/05/20      PT LONG TERM GOAL #3   Title Patient will ambulate with step through pattern utilizing LRAD.    Baseline step to, RW    Time 8    Period Weeks    Status New    Target Date 12/05/20      PT LONG TERM GOAL #4   Title Patient will score at least 56% function on FOTO to signify clinically meaningful improvement in functional abilities.    Baseline 26    Time 8    Period Weeks    Status New    Target Date 12/05/20             Pt seen for aquatic therapy today.  Treatment took place in water 3.25-4.8 ft in depth at the [facility] pool. Temp of water was 91.  Pt entered/exited the pool via stairs step to pattern independently with bilat rail. Warm up: forward, backward and side stepping/walking cues for increased step length, increased speed, hand placement to increase resistance.   Seated Hamstring, gastroc and add stretching 3-4 x 20 sec hold Knee flex/ext 2 x 10 reps vc for increased speed. Completes without discomfort Flutter kicking x 20 seconds x 5.     Standing  Holding to wall, progressing to noodle for core activation and balance challenges as well as LE strengthening: df, PF, marching,  add/abd, noodle kickdowns, hip extension and flex 2 x 10 reps Jogging 4 widths of pool then 2 lengths of pool Step ups leading with each LE 2 x 10 reps. Pt requiring vc and tc for execution and safety. Pt instruction on body mechanics and posture. Stair limbing alternating pattern x 5 steps x 2.   Gait training 6 widths of pool without UE support.  No LOB. VC for increased step length, challenging increasing speed to encouraged step through pattern       Pt requires buoyancy for support and to offload joints with strengthening exercises. Viscosity of the water is needed for resistance of strengthening; water current perturbations provides challenge to standing balance unsupported, requiring increased core activation.        Plan - 10/30/20 1715     Clinical Impression Statement Focus on knee ROM and le/core strength as well as aerboic capacity training.  Her time on task is improved today/less distracted.  Pt requiring constant vc and tc for proper positoning and execution of all activites.  She has progressed to amb in water without ue support increasing her gait speed and step length. No C/o pain in knees with activity. She does request end of treatment due to being tired.    PT Treatment/Interventions ADLs/Self Care Home Management;Aquatic Therapy;Cryotherapy;Dentist;Therapeutic activities;Therapeutic exercise;Balance training;Neuromuscular re-education;Patient/family education;Manual techniques;Passive range of motion;Dry needling;Taping;Vasopneumatic Device    PT Home Exercise Plan Access Code: [email or web address] Added aquatic ex. Will laminate for pt.             Patient will benefit from skilled therapeutic intervention in order to improve the following deficits and impairments:  Abnormal gait, Difficulty walking, Decreased endurance, Decreased activity tolerance, Pain, Improper body mechanics, Impaired flexibility, Decreased balance,  Decreased strength  Visit Diagnosis: Chronic pain of left knee  Other abnormalities of gait and mobility  Stiffness of left knee, not elsewhere classified  Acute pain of left knee  Muscle  weakness (generalized)  Difficulty in walking, not elsewhere classified  Localized edema     Problem List Patient Active Problem List   Diagnosis Date Noted   Fatigue 09/23/2020   Class 3 severe obesity with serious comorbidity and body mass index (BMI) of 50.0 to 59.9 in adult ([place]) 04/28/2020   Knee pain, left 04/23/2020   Diabetes mellitus ([place]) 02/19/2020   Schizoaffective disorder ([place]) 02/19/2020   Vitamin D deficiency 02/19/2020   Low serum vitamin B12 02/19/2020   Low back pain 02/19/2020   Knee pain, right 02/19/2020   BIPOLAR AFFECTIVE DISORDER 03/22/2009   GERD 03/22/2009    Vedia Pereyra 10/30/2020, 6:32 PM  [facility] [address] [place], [place], [address] Phone: [phone]   Fax:  [phone]  Name: Brenda Hernandez MRN: [id number] Date of Birth: July 02, 1975

## 2020-11-01 ENCOUNTER — Other Ambulatory Visit (HOSPITAL_COMMUNITY)
Admission: RE | Admit: 2020-11-01 | Discharge: 2020-11-01 | Disposition: A | Discharge status: Home / Self Care | Payer: Medicare HMO | Attending: Psychiatry | Admitting: Psychiatry

## 2020-11-01 DIAGNOSIS — Z79899 Other long term (current) drug therapy: Secondary | ICD-10-CM | POA: Insufficient documentation

## 2020-11-01 DIAGNOSIS — F259 Schizoaffective disorder, unspecified: Secondary | ICD-10-CM | POA: Insufficient documentation

## 2020-11-01 LAB — CBC WITH DIFFERENTIAL/PLATELET
Abs Immature Granulocytes: 0.01 10*3/uL (ref 0.00–0.07)
Basophils Absolute: 0 10*3/uL (ref 0.0–0.1)
Basophils Relative: 0 %
Eosinophils Absolute: 0 10*3/uL (ref 0.0–0.5)
Eosinophils Relative: 0 %
HCT: 36.1 % (ref 36.0–46.0)
Hemoglobin: 12.3 g/dL (ref 12.0–15.0)
Immature Granulocytes: 0 %
Lymphocytes Relative: 51 %
Lymphs Abs: 4.3 10*3/uL — ABNORMAL HIGH (ref 0.7–4.0)
MCH: 30.9 pg (ref 26.0–34.0)
MCHC: 34.1 g/dL (ref 30.0–36.0)
MCV: 90.7 fL (ref 80.0–100.0)
Monocytes Absolute: 0.6 10*3/uL (ref 0.1–1.0)
Monocytes Relative: 7 %
Neutro Abs: 3.5 10*3/uL (ref 1.7–7.7)
Neutrophils Relative %: 42 %
Platelets: 353 10*3/uL (ref 150–400)
RBC: 3.98 MIL/uL (ref 3.87–5.11)
RDW: 14.9 % (ref 11.5–15.5)
WBC: 8.4 10*3/uL (ref 4.0–10.5)
nRBC: 0 % (ref 0.0–0.2)

## 2020-11-04 ENCOUNTER — Ambulatory Visit: Payer: Medicare HMO

## 2020-11-04 ENCOUNTER — Other Ambulatory Visit: Payer: Self-pay

## 2020-11-04 DIAGNOSIS — M25562 Pain in left knee: Secondary | ICD-10-CM | POA: Diagnosis not present

## 2020-11-04 DIAGNOSIS — M25662 Stiffness of left knee, not elsewhere classified: Secondary | ICD-10-CM

## 2020-11-04 DIAGNOSIS — R262 Difficulty in walking, not elsewhere classified: Secondary | ICD-10-CM | POA: Diagnosis not present

## 2020-11-04 DIAGNOSIS — G8929 Other chronic pain: Secondary | ICD-10-CM

## 2020-11-04 DIAGNOSIS — R2689 Other abnormalities of gait and mobility: Secondary | ICD-10-CM | POA: Diagnosis not present

## 2020-11-04 DIAGNOSIS — R600 Localized edema: Secondary | ICD-10-CM | POA: Diagnosis not present

## 2020-11-04 DIAGNOSIS — M6281 Muscle weakness (generalized): Secondary | ICD-10-CM | POA: Diagnosis not present

## 2020-11-04 NOTE — Therapy (Signed)
[facility] [place], [place], [address] Phone: [phone]   Fax:  [phone]  Physical Therapy Treatment  Patient Details  Name: Brenda Hernandez MRN: [id number] Date of Birth: [birth year] Referring Provider (PT): Doren Custard Robb Matar, PA-C   Encounter Date: 11/04/2020   PT End of Session - 11/04/20 1631     Visit Number 7    Number of Visits 17    Date for PT Re-Evaluation 12/07/20    Authorization Type [employer]    Authorization Time Period 7/27-9/30/22    Authorization - Visit Number 6    Authorization - Number of Visits 16    PT Start Time 1630    PT Stop Time 1711    PT Time Calculation (min) 41 min    Equipment Utilized During Treatment --    Activity Tolerance Patient tolerated treatment well;Other (comment)   dizziness with standing activity   Behavior During Therapy WFL for tasks assessed/performed             Past Medical History:  Diagnosis Date   Abnormal pap    Bipolar 1 disorder ([facility])    GERD (gastroesophageal reflux disease)    Pre-diabetes    Schizophrenia ([place])    Swallowing difficulty     History reviewed. No pertinent surgical history.  There were no vitals filed for this visit.   Subjective Assessment - 11/04/20 1632     Subjective Patient reports she is doing ok. She reports compliance with HEP.    Currently in Pain? No/denies                [facility] PT Assessment - 11/04/20 0001       Observation/Other Assessments   Focus on Therapeutic Outcomes (FOTO)  49%                           [facility] Adult PT Treatment/Exercise - 11/04/20 0001       Self-Care   Other Self-Care Comments  see patient education      Knee/Hip Exercises: Aerobic   Nustep 5 minutes level 5 UE/LE      Knee/Hip Exercises: Standing   Other Standing Knee Exercises forward step overs 2 x10 small cone      Knee/Hip Exercises: Seated   Long Arc Quad 10 reps    Long Arc Quad Weight  --   1.5   Long Arc Quad Limitations x2; bilateral      Knee/Hip Exercises: Supine   Straight Leg Raises 10 reps    Straight Leg Raises Limitations x2; bilateral; partial range      Knee/Hip Exercises: Sidelying   Hip ABduction 10 reps    Hip ABduction Limitations x2; bilateral                    PT Education - 11/04/20 1706     Education Details FOTO score    Person(s) Educated Patient    Methods Explanation    Comprehension Verbalized understanding              PT Short Term Goals - 10/28/20 1525       PT SHORT TERM GOAL #1   Title Patient will be independent with initial HEP.    Baseline issued at eval.    Time 2    Period Weeks    Status Achieved    Target Date 10/24/20      PT SHORT TERM GOAL #2  Title Therapist will review FOTO score and anticipated functional progress.    Baseline FOTO captured; reviewed 8/15    Time 2    Period Weeks    Status Achieved    Target Date 10/24/20      PT SHORT TERM GOAL #3   Title Patient will complete TUG in less than 20 seconds indicative of improvement in gait speed.    Baseline see flowsheet    Time 4    Period Weeks    Status Achieved    Target Date 11/07/20      PT SHORT TERM GOAL #4   Title Patient will maintain Lt SLS for at least 5 seconds to improve stability necessary to reduce need for AD.    Baseline 1 second    Time 4    Period Weeks    Status Deferred    Target Date 11/07/20               PT Long Term Goals - 10/10/20 0916       PT LONG TERM GOAL #1   Title Patient will demonstrate at least 4/5 bilateral hip abductor and extensor strength to improve stability about the chain with prolonged walking/standing.    Baseline see flowsheet    Time 8    Period Weeks    Status New    Target Date 12/05/20      PT LONG TERM GOAL #2   Title Patient will be able to ascend/descend stairs utilizing reciprocal pattern.    Baseline step to, can only go up/down a few stairs right now     Time 8    Period Weeks    Status New    Target Date 12/05/20      PT LONG TERM GOAL #3   Title Patient will ambulate with step through pattern utilizing LRAD.    Baseline step to, RW    Time 8    Period Weeks    Status New    Target Date 12/05/20      PT LONG TERM GOAL #4   Title Patient will score at least 56% function on FOTO to signify clinically meaningful improvement in functional abilities.    Baseline 26    Time 8    Period Weeks    Status New    Target Date 12/05/20                   Plan - 11/04/20 1651     Clinical Impression Statement Her FOTO score has much improved compared to baseline, nearing the predicted outcome. She reported dizziness initially with standing activity that reduced with seated rest break, water, and a peppermint. With  forward step overs  she has difficulty allowing for knee/hip flexion bilaterally as she has tendency to circumduct to step over. With continued practice she intermittently performed correctly, continuing to require consistent verbal cues. Dizziness returned during second set of step overs, so remainder of ther ex was performed in seated or on the mat. Dizziness was improved with seated and mat activity.    PT Treatment/Interventions ADLs/Self Care Home Management;Aquatic Therapy;Cryotherapy;Dentist;Therapeutic activities;Therapeutic exercise;Balance training;Neuromuscular re-education;Patient/family education;Manual techniques;Passive range of motion;Dry needling;Taping;Vasopneumatic Device    PT Home Exercise Plan Access Code: [email or web address] Added aquatic ex. Will laminate for pt.             Patient will benefit from skilled therapeutic intervention in order to improve the following deficits and impairments:  Abnormal gait, Difficulty  walking, Decreased endurance, Decreased activity tolerance, Pain, Improper body mechanics, Impaired flexibility, Decreased balance, Decreased  strength  Visit Diagnosis: Chronic pain of left knee  Other abnormalities of gait and mobility  Stiffness of left knee, not elsewhere classified     Problem List Patient Active Problem List   Diagnosis Date Noted   Fatigue 09/23/2020   Class 3 severe obesity with serious comorbidity and body mass index (BMI) of 50.0 to 59.9 in adult ([place]) 04/28/2020   Knee pain, left 04/23/2020   Diabetes mellitus ([place]) 02/19/2020   Schizoaffective disorder ([place]) 02/19/2020   Vitamin D deficiency 02/19/2020   Low serum vitamin B12 02/19/2020   Low back pain 02/19/2020   Knee pain, right 02/19/2020   BIPOLAR AFFECTIVE DISORDER 03/22/2009   GERD 03/22/2009   Gwendolyn Grant, PT, DPT, ATC 11/04/20 5:14 PM   [facility] [address] [place], [place], [address] Phone: [phone]   Fax:  [phone]  Name: Brenda Hernandez MRN: [id number] Date of Birth: [birth year]

## 2020-11-05 ENCOUNTER — Ambulatory Visit (INDEPENDENT_AMBULATORY_CARE_PROVIDER_SITE_OTHER): Payer: Medicare HMO | Admitting: Physician Assistant

## 2020-11-06 ENCOUNTER — Encounter (HOSPITAL_BASED_OUTPATIENT_CLINIC_OR_DEPARTMENT_OTHER): Payer: Self-pay | Admitting: Physical Therapy

## 2020-11-06 ENCOUNTER — Ambulatory Visit (HOSPITAL_BASED_OUTPATIENT_CLINIC_OR_DEPARTMENT_OTHER): Payer: Medicare HMO | Admitting: Physical Therapy

## 2020-11-06 ENCOUNTER — Other Ambulatory Visit: Payer: Self-pay

## 2020-11-06 DIAGNOSIS — R262 Difficulty in walking, not elsewhere classified: Secondary | ICD-10-CM | POA: Diagnosis not present

## 2020-11-06 DIAGNOSIS — R600 Localized edema: Secondary | ICD-10-CM

## 2020-11-06 DIAGNOSIS — R2689 Other abnormalities of gait and mobility: Secondary | ICD-10-CM

## 2020-11-06 DIAGNOSIS — G8929 Other chronic pain: Secondary | ICD-10-CM | POA: Diagnosis not present

## 2020-11-06 DIAGNOSIS — M25662 Stiffness of left knee, not elsewhere classified: Secondary | ICD-10-CM

## 2020-11-06 DIAGNOSIS — M6281 Muscle weakness (generalized): Secondary | ICD-10-CM | POA: Diagnosis not present

## 2020-11-06 DIAGNOSIS — M25562 Pain in left knee: Secondary | ICD-10-CM | POA: Diagnosis not present

## 2020-11-06 NOTE — Therapy (Signed)
[facility] [address] [place], [place], [address] Phone: [phone]   Fax:  [phone]  Physical Therapy Treatment  Patient Details  Name: Brenda Hernandez MRN: [id number] Date of Birth: 1975-12-02 Referring Provider (PT): Cline Crock, [place]   Encounter Date: 11/06/2020   PT End of Session - 11/06/20 1645     Visit Number 8    Number of Visits 17    Date for PT Re-Evaluation 12/07/20    PT Start Time [id number]    PT Stop Time [id number]    PT Time Calculation (min) 44 min    Equipment Utilized During Treatment Other (comment)    Activity Tolerance Patient tolerated treatment well;Other (comment)    Behavior During Therapy WFL for tasks assessed/performed             Past Medical History:  Diagnosis Date   Abnormal pap    Bipolar 1 disorder ([place])    GERD (gastroesophageal reflux disease)    Pre-diabetes    Schizophrenia ([place])    Swallowing difficulty     History reviewed. No pertinent surgical history.  There were no vitals filed for this visit.   Subjective Assessment - 11/06/20 1724     Subjective "My back hur for about an hour after last session but not bad"    Currently in Pain? Yes    Pain Score 3     Pain Location Knee    Pain Orientation Left;Anterior    Pain Descriptors / Indicators Discomfort    Pain Type Chronic pain                                         PT Short Term Goals - 10/28/20 1525       PT SHORT TERM GOAL #1   Title Patient will be independent with initial HEP.    Baseline issued at eval.    Time 2    Period Weeks    Status Achieved    Target Date 10/24/20      PT SHORT TERM GOAL #2   Title Therapist will review FOTO score and anticipated functional progress.    Baseline FOTO captured; reviewed 8/15    Time 2    Period Weeks    Status Achieved    Target Date 10/24/20      PT SHORT TERM GOAL #3   Title Patient will complete TUG in less  than 20 seconds indicative of improvement in gait speed.    Baseline see flowsheet    Time 4    Period Weeks    Status Achieved    Target Date 11/07/20      PT SHORT TERM GOAL #4   Title Patient will maintain Lt SLS for at least 5 seconds to improve stability necessary to reduce need for AD.    Baseline 1 second    Time 4    Period Weeks    Status Deferred    Target Date 11/07/20               PT Long Term Goals - 10/10/20 0916       PT LONG TERM GOAL #1   Title Patient will demonstrate at least 4/5 bilateral hip abductor and extensor strength to improve stability about the chain with prolonged walking/standing.    Baseline see flowsheet    Time 8    Period  Weeks    Status New    Target Date 12/05/20      PT LONG TERM GOAL #2   Title Patient will be able to ascend/descend stairs utilizing reciprocal pattern.    Baseline step to, can only go up/down a few stairs right now    Time 8    Period Weeks    Status New    Target Date 12/05/20      PT LONG TERM GOAL #3   Title Patient will ambulate with step through pattern utilizing LRAD.    Baseline step to, RW    Time 8    Period Weeks    Status New    Target Date 12/05/20      PT LONG TERM GOAL #4   Title Patient will score at least 56% function on FOTO to signify clinically meaningful improvement in functional abilities.    Baseline 26    Time 8    Period Weeks    Status New    Target Date 12/05/20              Pt seen for aquatic therapy today.  Treatment took place in water 3.25-4.8 ft in depth at the [facility] pool. Temp of water was 91.  Pt entered/exited the pool via stairs step to pattern independently with bilat rail. Warm up: forward, backward and side stepping/walking cues for increased step length, increased speed, hand placement to increase resistance.   Seated Hamstring, gastroc and add stretching 3-4 x 20 sec hold Knee flex/ext 2 x 10 reps vc for increased speed. Completes  without discomfort Adduction abd 2 x 10 rep. VC for increased speed for added resistance. Flutter kicking x 20 seconds x 5.  VC for tightening of abdominals and glutes for core stability.  Pt with difficulty initially maintaining position.   Standing [place] stepping 6 widths1 Jogging 6 widths of pool then 2 lengths of pool Step ups leading with each LE 2 x 10 reps. Pt requiring vc and tc for execution and safety. Pt instruction on body mechanics and posture. Unilateral stance LLE submerged to waist.  Pt able to hold x 8 seconds (best)  Suspended vertically Seated on noodle. Balance /core challenges to find and maintain center of buoyancy and gravity (core strengthening). Bicylicing 2 lengths of pool and 2 widths.  Gait training 6 widths of pool without UE support.  No LOB. VC for increased step length, challenging increasing speed to encouraged step through pattern    Pt requires buoyancy for support and to offload joints with strengthening exercises. Viscosity of the water is needed for resistance of strengthening; water current perturbations provides challenge to standing balance unsupported, requiring increased core activation.         Plan - 11/06/20 1727     Clinical Impression Statement Reports of slight dizziness today as well as last visit.  Once engaged in therapy no more mention of it other than encouraging pt to make sure fluid intake is adequate.  Advanced balance/core strengthening to le strengthening/ROM.  She does c/o min discomfort in left knee with water step ups so limited to only 10 rep. Pt able to gain balance sitting on noodle to bicycle length of pool and back with intermittent LOB. LE's fatigued at end of session. Pt able to tolerate LE strengthening/balance and core challenges with added benefit of aerobic capacity work in aquatic setting for 45 min session with short recovery periods.    PT Treatment/Interventions ADLs/Self Care Home Management;Aquatic  Therapy;Cryotherapy;Dentist;Therapeutic activities;Therapeutic exercise;Balance training;Neuromuscular re-education;Patient/family education;Manual techniques;Passive range of motion;Dry needling;Taping;Vasopneumatic Device    PT Home Exercise Plan Access Code: [email or web address] Added aquatic ex. Will laminate for pt.             Patient will benefit from skilled therapeutic intervention in order to improve the following deficits and impairments:  Abnormal gait, Difficulty walking, Decreased endurance, Decreased activity tolerance, Pain, Improper body mechanics, Impaired flexibility, Decreased balance, Decreased strength  Visit Diagnosis: Chronic pain of left knee  Difficulty in walking, not elsewhere classified  Localized edema  Other abnormalities of gait and mobility  Stiffness of left knee, not elsewhere classified  Acute pain of left knee  Muscle weakness (generalized)     Problem List Patient Active Problem List   Diagnosis Date Noted   Fatigue 09/23/2020   Class 3 severe obesity with serious comorbidity and body mass index (BMI) of 50.0 to 59.9 in adult ([place]) 04/28/2020   Knee pain, left 04/23/2020   Diabetes mellitus ([place]) 02/19/2020   Schizoaffective disorder ([place]) 02/19/2020   Vitamin D deficiency 02/19/2020   Low serum vitamin B12 02/19/2020   Low back pain 02/19/2020   Knee pain, right 02/19/2020   BIPOLAR AFFECTIVE DISORDER 03/22/2009   GERD 03/22/2009    Vedia Pereyra MPT 11/06/2020, 5:40 PM  [facility] [address] [place], [place], [address] Phone: [phone]   Fax:  [phone]  Name: Brenda Hernandez MRN: [id number] Date of Birth: 1975-06-02

## 2020-11-07 ENCOUNTER — Ambulatory Visit (INDEPENDENT_AMBULATORY_CARE_PROVIDER_SITE_OTHER): Payer: Medicare HMO | Admitting: Family Medicine

## 2020-11-07 ENCOUNTER — Other Ambulatory Visit: Payer: Self-pay

## 2020-11-07 ENCOUNTER — Encounter (INDEPENDENT_AMBULATORY_CARE_PROVIDER_SITE_OTHER): Payer: Self-pay | Admitting: Family Medicine

## 2020-11-07 VITALS — BP 120/80 | HR 92 | Temp 98.4°F | Ht 63.0 in | Wt 258.0 lb

## 2020-11-07 DIAGNOSIS — R7303 Prediabetes: Secondary | ICD-10-CM

## 2020-11-07 DIAGNOSIS — Z6841 Body Mass Index (BMI) 40.0 and over, adult: Secondary | ICD-10-CM

## 2020-11-11 ENCOUNTER — Other Ambulatory Visit: Payer: Self-pay

## 2020-11-11 ENCOUNTER — Ambulatory Visit: Payer: Medicare HMO

## 2020-11-11 DIAGNOSIS — M25562 Pain in left knee: Secondary | ICD-10-CM

## 2020-11-11 DIAGNOSIS — M25662 Stiffness of left knee, not elsewhere classified: Secondary | ICD-10-CM | POA: Diagnosis not present

## 2020-11-11 DIAGNOSIS — R262 Difficulty in walking, not elsewhere classified: Secondary | ICD-10-CM

## 2020-11-11 DIAGNOSIS — R2689 Other abnormalities of gait and mobility: Secondary | ICD-10-CM

## 2020-11-11 DIAGNOSIS — R600 Localized edema: Secondary | ICD-10-CM

## 2020-11-11 DIAGNOSIS — M6281 Muscle weakness (generalized): Secondary | ICD-10-CM | POA: Diagnosis not present

## 2020-11-11 DIAGNOSIS — G8929 Other chronic pain: Secondary | ICD-10-CM | POA: Diagnosis not present

## 2020-11-11 NOTE — Therapy (Signed)
[place], [place], [address] Phone: [phone]   Fax:  [phone]  Physical Therapy Treatment  Patient Details  Name: Brenda Hernandez MRN: [id number] Date of Birth: [birth year] Referring Provider (PT): Doren Custard Robb Matar, PA-C   Encounter Date: 11/11/2020   PT End of Session - 11/11/20 1500     Visit Number 9    Number of Visits 17    Date for PT Re-Evaluation 12/07/20    Authorization Type [employer]    Authorization Time Period 7/27-9/30/22    Authorization - Visit Number 8    Authorization - Number of Visits 16    PT Start Time 1500    PT Stop Time 1542    PT Time Calculation (min) 42 min    Activity Tolerance Patient tolerated treatment well;Patient limited by fatigue    Behavior During Therapy [facility] for tasks assessed/performed             Past Medical History:  Diagnosis Date   Abnormal pap    Bipolar 1 disorder ([place])    GERD (gastroesophageal reflux disease)    Pre-diabetes    Schizophrenia ([place])    Swallowing difficulty     History reviewed. No pertinent surgical history.  There were no vitals filed for this visit.   Subjective Assessment - 11/11/20 1501     Subjective Patient reports she is doing ok today. Was able to go without the walker some over the weekend with minimal pain.    Currently in Pain? No/denies                               [facility] Adult PT Treatment/Exercise - 11/11/20 0001       Knee/Hip Exercises: Aerobic   Nustep 5 minutes level 5 UE/LE      Knee/Hip Exercises: Standing   SLS 1 trial each 11 sec RLE; 6 sec LLE    Other Standing Knee Exercises lateral band walks 2 sets d/b in // bars red band at shins; standing march 2 x 10 in //bars; hurdle step overs in // bars 4 hurdles 2 sets d/b    Other Standing Knee Exercises lateral step up on airex 2 x 5; standing hamstring curl 2 x 10 2#      Knee/Hip Exercises: Seated   Long Arc Quad 10  reps    Long Arc Quad Weight 2 lbs.    Long Arc Quad Limitations x2; bilateral                      PT Short Term Goals - 11/11/20 1513       PT SHORT TERM GOAL #1   Title Patient will be independent with initial HEP.    Baseline issued at eval.    Time 2    Period Weeks    Status Achieved    Target Date 10/24/20      PT SHORT TERM GOAL #2   Title Therapist will review FOTO score and anticipated functional progress.    Baseline FOTO captured; reviewed 8/15    Time 2    Period Weeks    Status Achieved    Target Date 10/24/20      PT SHORT TERM GOAL #3   Title Patient will complete TUG in less than 20 seconds indicative of improvement in gait speed.    Baseline see flowsheet    Time 4  Period Weeks    Status Achieved    Target Date 11/07/20      PT SHORT TERM GOAL #4   Title Patient will maintain Lt SLS for at least 5 seconds to improve stability necessary to reduce need for AD.    Baseline 1 second; 6 seconds LLE    Time 4    Period Weeks    Status Achieved    Target Date 11/07/20               PT Long Term Goals - 10/10/20 0916       PT LONG TERM GOAL #1   Title Patient will demonstrate at least 4/5 bilateral hip abductor and extensor strength to improve stability about the chain with prolonged walking/standing.    Baseline see flowsheet    Time 8    Period Weeks    Status New    Target Date 12/05/20      PT LONG TERM GOAL #2   Title Patient will be able to ascend/descend stairs utilizing reciprocal pattern.    Baseline step to, can only go up/down a few stairs right now    Time 8    Period Weeks    Status New    Target Date 12/05/20      PT LONG TERM GOAL #3   Title Patient will ambulate with step through pattern utilizing LRAD.    Baseline step to, RW    Time 8    Period Weeks    Status New    Target Date 12/05/20      PT LONG TERM GOAL #4   Title Patient will score at least 56% function on FOTO to signify clinically  meaningful improvement in functional abilities.    Baseline 26    Time 8    Period Weeks    Status New    Target Date 12/05/20                   Plan - 11/11/20 1505     Clinical Impression Statement Patient tolerated session well today with ability to progress standing strengthening with occasional complaints of dizziness and fatigue that resolved with seated rest break. She initially demonstrates circumduction swing with hurdle step overs, but with continued practice and cueing she is able to complete correctly. She quickly fatigues with lateral step ups on airex completing 5 reps before needing a rest break. She reported Lt knee pain towards the end of session with resisted hamstring curls and LAQ, otherwise no reports of Lt knee pain throughout session. She was able to maintain SLS on the LLE for 6 seconds having met this short term functional goal.    PT Treatment/Interventions ADLs/Self Care Home Management;Aquatic Therapy;Cryotherapy;Dentist;Therapeutic activities;Therapeutic exercise;Balance training;Neuromuscular re-education;Patient/family education;Manual techniques;Passive range of motion;Dry needling;Taping;Vasopneumatic Device    PT Next Visit Plan BLE strengthening, static/dynamic balance    PT Home Exercise Plan Access Code: [email or web address] Added aquatic ex. Will laminate for pt.    Consulted and Agree with Plan of Care Patient             Patient will benefit from skilled therapeutic intervention in order to improve the following deficits and impairments:  Abnormal gait, Difficulty walking, Decreased endurance, Decreased activity tolerance, Pain, Improper body mechanics, Impaired flexibility, Decreased balance, Decreased strength  Visit Diagnosis: Chronic pain of left knee  Difficulty in walking, not elsewhere classified  Localized edema  Other abnormalities of gait and mobility  Problem List Patient  Active Problem List   Diagnosis Date Noted   Fatigue 09/23/2020   Class 3 severe obesity with serious comorbidity and body mass index (BMI) of 50.0 to 59.9 in adult ([place]) 04/28/2020   Knee pain, left 04/23/2020   Diabetes mellitus ([place]) 02/19/2020   Schizoaffective disorder ([place]) 02/19/2020   Vitamin D deficiency 02/19/2020   Low serum vitamin B12 02/19/2020   Low back pain 02/19/2020   Knee pain, right 02/19/2020   BIPOLAR AFFECTIVE DISORDER 03/22/2009   GERD 03/22/2009   Gwendolyn Grant, PT, DPT, ATC 11/11/20 3:48 PM  [facility] [address] [place], [place], [address] Phone: [phone]   Fax:  [phone]  Name: Brenda Hernandez MRN: [id number] Date of Birth: Jun 30, 1975

## 2020-11-11 NOTE — Progress Notes (Signed)
Chief Complaint:   OBESITY Danyia is here to discuss her progress with her obesity treatment plan along with follow-up of her obesity related diagnoses. Darianny is on the Category 3 Plan and states she is following her eating plan approximately 50% of the time. Monda states she is doing 0 minutes 0 times per week.  Today's visit was #: 14 Starting weight: 287 lbs Starting date: 03/26/2020 Today's weight: 258 lbs Today's date: 11/07/2020 Total lbs lost to date: 29 Total lbs lost since last in-office visit: 5  Interim History: Oretta continues to do well with weight loss. She is working on eating healthier. Her birthday is soon but she feels she will do well even then. Her hunger is controlled.  Subjective:   1. Pre-diabetes Khamiya is working on diet, exercise, and weight loss. She is stable on metformin, and she denies nausea or vomiting.  Assessment/Plan:   1. Pre-diabetes Clarke will continue metformin, diet, and exercise and decreasing simple carbohydrates to help decrease the risk of diabetes. We will check labs in 1 month.  2. Obesity: Current BMI 45.8 Chianna is currently in the action stage of change. As such, her goal is to continue with weight loss efforts. She has agreed to the Category 3 Plan.   We will recheck fasting labs at her next visit.  Exercise goals: Continue aquatic therapy walking.  Behavioral modification strategies: increasing lean protein intake and meal planning and cooking strategies.  Dedee has agreed to follow-up with our clinic in 3 to 4 weeks. She was informed of the importance of frequent follow-up visits to maximize her success with intensive lifestyle modifications for her multiple health conditions.   Objective:   Blood pressure 120/80, pulse 92, temperature 98.4 F (36.9 C), height '5\' 3"'$  (1.6 m), weight 258 lb (117 kg), SpO2 98 %. Body mass index is 45.7 kg/m.  General: Cooperative, alert, well developed, in no acute  distress. HEENT: Conjunctivae and lids unremarkable. Cardiovascular: Regular rhythm.  Lungs: Normal work of breathing. Neurologic: No focal deficits.   Lab Results  Component Value Date   CREATININE 0.74 08/03/2020   BUN 10 08/03/2020   NA 140 08/03/2020   K 3.8 08/03/2020   CL 107 08/03/2020   CO2 25 08/03/2020   Lab Results  Component Value Date   ALT 13 08/03/2020   AST 18 08/03/2020   ALKPHOS 60 08/03/2020   BILITOT 0.5 08/03/2020   Lab Results  Component Value Date   HGBA1C 5.7 (H) 08/03/2020   HGBA1C 5.6 07/18/2020   HGBA1C 5.8 (H) 03/26/2020   HGBA1C 5.4 02/03/2020   HGBA1C 5.5 07/29/2019   Lab Results  Component Value Date   INSULIN 10.8 07/18/2020   INSULIN 24.7 03/26/2020   Lab Results  Component Value Date   TSH 1.55 09/23/2020   Lab Results  Component Value Date   CHOL 194 08/03/2020   HDL 57 08/03/2020   LDLCALC 117 (H) 08/03/2020   TRIG 100 08/03/2020   CHOLHDL 3.4 08/03/2020   Lab Results  Component Value Date   VD25OH 91.02 08/03/2020   VD25OH 68.8 07/18/2020   VD25OH 33.0 03/26/2020   Lab Results  Component Value Date   WBC 8.4 11/01/2020   HGB 12.3 11/01/2020   HCT 36.1 11/01/2020   MCV 90.7 11/01/2020   PLT 353 11/01/2020   No results found for: IRON, TIBC, FERRITIN  Attestation Statements:   Reviewed by clinician on day of visit: allergies, medications, problem list, medical history, surgical  history, family history, social history, and previous encounter notes.  Time spent on visit including pre-visit chart review and post-visit care and charting was 30 minutes.    I, Trixie Dredge, am acting as transcriptionist for Dennard Nip, MD.  I have reviewed the above documentation for accuracy and completeness, and I agree with the above. -  Dennard Nip, MD

## 2020-11-12 ENCOUNTER — Telehealth: Payer: Self-pay

## 2020-11-12 NOTE — Telephone Encounter (Signed)
Please advise as the pt has stated she has not had a bowl movement since Friday and would like to know which stool softner is recommended to help her?  Pt can be reached at [phone].

## 2020-11-12 NOTE — Telephone Encounter (Signed)
Try MiraLAX 1-2 scoops a day.  Dissolve in the water.  Thanks

## 2020-11-13 ENCOUNTER — Encounter (HOSPITAL_BASED_OUTPATIENT_CLINIC_OR_DEPARTMENT_OTHER): Payer: Self-pay | Admitting: Physical Therapy

## 2020-11-13 ENCOUNTER — Ambulatory Visit (HOSPITAL_BASED_OUTPATIENT_CLINIC_OR_DEPARTMENT_OTHER): Payer: Medicare HMO | Admitting: Physical Therapy

## 2020-11-13 ENCOUNTER — Other Ambulatory Visit: Payer: Self-pay

## 2020-11-13 DIAGNOSIS — M25562 Pain in left knee: Secondary | ICD-10-CM | POA: Diagnosis not present

## 2020-11-13 DIAGNOSIS — R2689 Other abnormalities of gait and mobility: Secondary | ICD-10-CM | POA: Diagnosis not present

## 2020-11-13 DIAGNOSIS — M6281 Muscle weakness (generalized): Secondary | ICD-10-CM

## 2020-11-13 DIAGNOSIS — R262 Difficulty in walking, not elsewhere classified: Secondary | ICD-10-CM

## 2020-11-13 DIAGNOSIS — R600 Localized edema: Secondary | ICD-10-CM

## 2020-11-13 DIAGNOSIS — G8929 Other chronic pain: Secondary | ICD-10-CM

## 2020-11-13 DIAGNOSIS — M25662 Stiffness of left knee, not elsewhere classified: Secondary | ICD-10-CM

## 2020-11-13 NOTE — Therapy (Signed)
[place], [place], [address] Phone: [phone]   Fax:  [phone]  Physical Therapy Treatment  Patient Details  Name: Genesia Strupp MRN: [id number] Date of Birth: [birth year] Referring Provider (PT): Cline Crock, PA-C   Encounter Date: 11/13/2020   PT End of Session - 11/13/20 1637     Visit Number 10    Number of Visits 17    Date for PT Re-Evaluation 12/07/20    Authorization Type [employer]    PT Start Time [id number]    PT Stop Time 1714    PT Time Calculation (min) 39 min    Equipment Utilized During Treatment Other (comment)    Activity Tolerance Patient tolerated treatment well;Patient limited by fatigue    Behavior During Therapy [facility] for tasks assessed/performed             Past Medical History:  Diagnosis Date   Abnormal pap    Bipolar 1 disorder ([place])    GERD (gastroesophageal reflux disease)    Pre-diabetes    Schizophrenia ([place])    Swallowing difficulty     History reviewed. No pertinent surgical history.  There were no vitals filed for this visit.   Subjective Assessment - 11/13/20 1832     Subjective "looking forward to getting into pool.  verything is about the same"    Currently in Pain? Yes    Pain Score 3     Pain Orientation Left;Anterior    Pain Descriptors / Indicators Discomfort    Pain Type Chronic pain    Pain Onset More than a month ago                                       PT Education - 11/13/20 1833     Education Details HEP instruction using lamentated copy    Person(s) Educated Patient    Methods Explanation;Demonstration    Comprehension Need further instruction;Verbalized understanding              PT Short Term Goals - 11/11/20 1513       PT SHORT TERM GOAL #1   Title Patient will be independent with initial HEP.    Baseline issued at eval.    Time 2    Period Weeks    Status Achieved    Target Date  10/24/20      PT SHORT TERM GOAL #2   Title Therapist will review FOTO score and anticipated functional progress.    Baseline FOTO captured; reviewed 8/15    Time 2    Period Weeks    Status Achieved    Target Date 10/24/20      PT SHORT TERM GOAL #3   Title Patient will complete TUG in less than 20 seconds indicative of improvement in gait speed.    Baseline see flowsheet    Time 4    Period Weeks    Status Achieved    Target Date 11/07/20      PT SHORT TERM GOAL #4   Title Patient will maintain Lt SLS for at least 5 seconds to improve stability necessary to reduce need for AD.    Baseline 1 second; 6 seconds LLE    Time 4    Period Weeks    Status Achieved    Target Date 11/07/20  PT Long Term Goals - 10/10/20 0916       PT LONG TERM GOAL #1   Title Patient will demonstrate at least 4/5 bilateral hip abductor and extensor strength to improve stability about the chain with prolonged walking/standing.    Baseline see flowsheet    Time 8    Period Weeks    Status New    Target Date 12/05/20      PT LONG TERM GOAL #2   Title Patient will be able to ascend/descend stairs utilizing reciprocal pattern.    Baseline step to, can only go up/down a few stairs right now    Time 8    Period Weeks    Status New    Target Date 12/05/20      PT LONG TERM GOAL #3   Title Patient will ambulate with step through pattern utilizing LRAD.    Baseline step to, RW    Time 8    Period Weeks    Status New    Target Date 12/05/20      PT LONG TERM GOAL #4   Title Patient will score at least 56% function on FOTO to signify clinically meaningful improvement in functional abilities.    Baseline 26    Time 8    Period Weeks    Status New    Target Date 12/05/20           Pt seen for aquatic therapy today.  Treatment took place in water 3.25-4.8 ft in depth at the [employer] pool. Temp of water was 91.  Pt entered/exited the pool via stairs step to  pattern independently with bilat rail. Warm up: forward, backward and side stepping/walking cues for increased step length, increased speed, hand placement to increase resistance.  Pt given laminated HEP.  Reviewed and instructed throughout session.   Seated Hamstring, gastroc and add stretching 3-4 x 20 sec hold Knee flex;ext 2 x 10 reps vc for increased speed. Adduction abd 2 x 10 rep. VC for increased speed for added resistance. Flutter kicking x 20 seconds x 5.  VC for tightening of abdominals and glutes for core stability.  Attempted to complete in prone holding to step, pt uable to maintain position    Standing Forward and backward lunges x 4 lengths each Step ups leading with each LE 2 x 10 reps. Pt instruction on body mechanics and posture.  Gait training 6 widths of pool without UE support.  No LOB. Minor cuing for quality.    Pt requires buoyancy for support and to offload joints with strengthening exercises. Viscosity of the water is needed for resistance of strengthening; water current perturbations provides challenge to standing balance unsupported, requiring increased core activation.            Plan - 11/13/20 1835     Clinical Impression Statement Pt given lamentated HEP. Focus on going through program and ensuring undersatnding as well as modifiying for pt easy compliance.  She will need re instruction as I am uncertain of her level of understanidng.  She was encouraged to look up the program on the website and watch the videos. Pt continues to arrive at session in a wc.  She reports walking some at home without walker.  Gait quality in aquatic setting WFL. Note:pt lasted the entire session today without stating she was tired and needed to be done. Improved endurance.    PT Treatment/Interventions ADLs/Self Care Home Management;Aquatic Therapy;Cryotherapy;Dentist;Therapeutic activities;Therapeutic exercise;Balance  training;Neuromuscular re-education;Patient/family education;Manual techniques;Passive range of motion;Dry needling;Taping;Vasopneumatic Device    PT Next Visit Plan Aquatic HEP instruction    PT Home Exercise Plan Access Code: [email or web address]             Patient will benefit from skilled therapeutic intervention in order to improve the following deficits and impairments:  Abnormal gait, Difficulty walking, Decreased endurance, Decreased activity tolerance, Pain, Improper body mechanics, Impaired flexibility, Decreased balance, Decreased strength  Visit Diagnosis: Chronic pain of left knee  Acute pain of left knee  Muscle weakness (generalized)  Difficulty in walking, not elsewhere classified  Localized edema  Other abnormalities of gait and mobility  Stiffness of left knee, not elsewhere classified     Problem List Patient Active Problem List   Diagnosis Date Noted   Fatigue 09/23/2020   Class 3 severe obesity with serious comorbidity and body mass index (BMI) of 50.0 to 59.9 in adult ([place]) 04/28/2020   Knee pain, left 04/23/2020   Diabetes mellitus ([place]) 02/19/2020   Schizoaffective disorder ([place]) 02/19/2020   Vitamin D deficiency 02/19/2020   Low serum vitamin B12 02/19/2020   Low back pain 02/19/2020   Knee pain, right 02/19/2020   BIPOLAR AFFECTIVE DISORDER 03/22/2009   GERD 03/22/2009    Vedia Pereyra MPT 11/13/2020, 6:41 PM  [facility] [address] [place], [place], [address] Phone: [phone]   Fax:  [phone]  Name: Emerita Bonis MRN: [id number] Date of Birth: October 30, 1975

## 2020-11-13 NOTE — Telephone Encounter (Signed)
Notified pt w/MD response.../lmb 

## 2020-11-20 ENCOUNTER — Other Ambulatory Visit: Payer: Self-pay

## 2020-11-20 ENCOUNTER — Ambulatory Visit: Payer: Medicare HMO | Attending: Physician Assistant

## 2020-11-20 DIAGNOSIS — R262 Difficulty in walking, not elsewhere classified: Secondary | ICD-10-CM

## 2020-11-20 DIAGNOSIS — M25562 Pain in left knee: Secondary | ICD-10-CM | POA: Insufficient documentation

## 2020-11-20 DIAGNOSIS — G8929 Other chronic pain: Secondary | ICD-10-CM | POA: Diagnosis not present

## 2020-11-20 DIAGNOSIS — M6281 Muscle weakness (generalized): Secondary | ICD-10-CM | POA: Diagnosis not present

## 2020-11-20 DIAGNOSIS — R2689 Other abnormalities of gait and mobility: Secondary | ICD-10-CM | POA: Insufficient documentation

## 2020-11-20 NOTE — Therapy (Signed)
[place], [place], [address] Phone: [phone]   Fax:  [phone]  Physical Therapy Treatment Progress Note Reporting Period 10/09/20 to 11/20/20  See note below for Objective Data and Assessment of Progress/Goals.     Patient Details  Name: Brenda Hernandez MRN: [id number] Date of Birth: May 20, 1975 Referring Provider (PT): Doren Custard Robb Matar, PA-C   Encounter Date: 11/20/2020   PT End of Session - 11/20/20 1617     Visit Number 11    Number of Visits 17    Date for PT Re-Evaluation 12/07/20    Authorization Type [employer]    Authorization Time Period 7/27-9/30/22    Authorization - Visit Number 10    Authorization - Number of Visits 16    Progress Note Due on Visit 21    PT Start Time 1700    PT Stop Time 1657    PT Time Calculation (min) 40 min    Activity Tolerance Patient tolerated treatment well;Patient limited by fatigue    Behavior During Therapy [facility] for tasks assessed/performed             Past Medical History:  Diagnosis Date   Abnormal pap    Bipolar 1 disorder ([place])    GERD (gastroesophageal reflux disease)    Pre-diabetes    Schizophrenia ([place])    Swallowing difficulty     History reviewed. No pertinent surgical history.  There were no vitals filed for this visit.   Subjective Assessment - 11/20/20 1618     Subjective Patient reports she is doing ok today. She denies any pain currently. Has been going without the RW on the weekends mostly.    Currently in Pain? No/denies    Pain Onset More than a month ago                [facility] PT Assessment - 11/20/20 0001       Observation/Other Assessments   Focus on Therapeutic Outcomes (FOTO)  69%      Strength   Right Hip Extension 3+/5    Right Hip ABduction 4-/5    Left Hip Extension 4-/5    Left Hip ABduction 4/5      Ambulation/Gait   Ambulation/Gait Yes    Assistive device Rolling walker    Gait Comments step  through pattern, lateral trunk lean                           [facility] Adult PT Treatment/Exercise - 11/20/20 0001       Self-Care   Self-Care Other Self-Care Comments    Other Self-Care Comments  see patient education      Knee/Hip Exercises: Aerobic   Nustep 5 minutes level 5 UE/LE      Knee/Hip Exercises: Standing   Hip Abduction 10 reps    Abduction Limitations x2; bilateral    Hip Extension 10 reps    Extension Limitations x2; bilateral    Forward Step Up 10 reps    Forward Step Up Limitations x2; 2 inch step bilaterally      Knee/Hip Exercises: Seated   Sit to Sand 10 reps;2 sets                     PT Education - 11/20/20 1643     Education Details Education on progress towards goals, FOTO score.    Person(s) Educated Patient    Methods Explanation  Comprehension Verbalized understanding              PT Short Term Goals - 11/11/20 1513       PT SHORT TERM GOAL #1   Title Patient will be independent with initial HEP.    Baseline issued at eval.    Time 2    Period Weeks    Status Achieved    Target Date 10/24/20      PT SHORT TERM GOAL #2   Title Therapist will review FOTO score and anticipated functional progress.    Baseline FOTO captured; reviewed 8/15    Time 2    Period Weeks    Status Achieved    Target Date 10/24/20      PT SHORT TERM GOAL #3   Title Patient will complete TUG in less than 20 seconds indicative of improvement in gait speed.    Baseline see flowsheet    Time 4    Period Weeks    Status Achieved    Target Date 11/07/20      PT SHORT TERM GOAL #4   Title Patient will maintain Lt SLS for at least 5 seconds to improve stability necessary to reduce need for AD.    Baseline 1 second; 6 seconds LLE    Time 4    Period Weeks    Status Achieved    Target Date 11/07/20               PT Long Term Goals - 11/20/20 1626       PT LONG TERM GOAL #1   Title Patient will demonstrate at least 4/5  bilateral hip abductor and extensor strength to improve stability about the chain with prolonged walking/standing.    Baseline see flowsheet    Time 8    Period Weeks    Status Partially Met      PT LONG TERM GOAL #2   Title Patient will be able to ascend/descend stairs utilizing reciprocal pattern.    Baseline utilizes reciprocal pattern    Time 8    Period Weeks    Status Achieved      PT LONG TERM GOAL #3   Title Patient will ambulate with step through pattern utilizing LRAD.    Baseline step through, continues to utilize RW during the week (less use on the weekends)    Time 8    Period Weeks    Status Achieved      PT LONG TERM GOAL #4   Title Patient will score at least 56% function on FOTO to signify clinically meaningful improvement in functional abilities.    Baseline 26; 69% 11/20/20    Time 8    Period Weeks    Status Achieved                   Plan - 11/20/20 1631     Clinical Impression Statement Brenda Hernandez is making good functional progress having previously met all short term functional goals and has met 3/4 long term functional goals. She continues to utilize her RW more often than not, but demonstrates step through pattern when using. She continues to have bilateral hip weakness and will benefit from continuing with current POC to further progress her LE strength and endurance to improve her overall stability and tolerance to walking activity. She tolerated today's session well focusing on hip  and CKC strengthening, though continues to fatigue quickly with standing activity requiring frequent seated rest breaks.  PT Treatment/Interventions ADLs/Self Care Home Management;Aquatic Therapy;Cryotherapy;Dentist;Therapeutic activities;Therapeutic exercise;Balance training;Neuromuscular re-education;Patient/family education;Manual techniques;Passive range of motion;Dry needling;Taping;Vasopneumatic Device    PT  Next Visit Plan hip strengthening    PT Home Exercise Plan Access Code: [email or web address]    Consulted and Agree with Plan of Care Patient             Patient will benefit from skilled therapeutic intervention in order to improve the following deficits and impairments:  Abnormal gait, Difficulty walking, Decreased endurance, Decreased activity tolerance, Pain, Improper body mechanics, Impaired flexibility, Decreased balance, Decreased strength  Visit Diagnosis: Chronic pain of left knee  Muscle weakness (generalized)  Difficulty in walking, not elsewhere classified  Other abnormalities of gait and mobility     Problem List Patient Active Problem List   Diagnosis Date Noted   Fatigue 09/23/2020   Class 3 severe obesity with serious comorbidity and body mass index (BMI) of 50.0 to 59.9 in adult ([place]) 04/28/2020   Knee pain, left 04/23/2020   Diabetes mellitus ([place]) 02/19/2020   Schizoaffective disorder ([place]) 02/19/2020   Vitamin D deficiency 02/19/2020   Low serum vitamin B12 02/19/2020   Low back pain 02/19/2020   Knee pain, right 02/19/2020   BIPOLAR AFFECTIVE DISORDER 03/22/2009   GERD 03/22/2009   Gwendolyn Grant, PT, DPT, ATC 11/20/20 4:59 PM  [facility] [address] [place], [place], [address] Phone: [phone]   Fax:  [phone]  Name: Brenda Hernandez MRN: [id number] Date of Birth: 02-12-1976

## 2020-11-21 DIAGNOSIS — M84352D Stress fracture, left femur, subsequent encounter for fracture with routine healing: Secondary | ICD-10-CM | POA: Diagnosis not present

## 2020-11-21 DIAGNOSIS — M1712 Unilateral primary osteoarthritis, left knee: Secondary | ICD-10-CM | POA: Diagnosis not present

## 2020-11-21 DIAGNOSIS — M170 Bilateral primary osteoarthritis of knee: Secondary | ICD-10-CM | POA: Diagnosis not present

## 2020-11-25 ENCOUNTER — Ambulatory Visit: Payer: Medicare HMO

## 2020-11-25 ENCOUNTER — Other Ambulatory Visit: Payer: Self-pay

## 2020-11-25 DIAGNOSIS — R2689 Other abnormalities of gait and mobility: Secondary | ICD-10-CM

## 2020-11-25 DIAGNOSIS — M25562 Pain in left knee: Secondary | ICD-10-CM

## 2020-11-25 DIAGNOSIS — G8929 Other chronic pain: Secondary | ICD-10-CM | POA: Diagnosis not present

## 2020-11-25 DIAGNOSIS — R262 Difficulty in walking, not elsewhere classified: Secondary | ICD-10-CM

## 2020-11-25 DIAGNOSIS — M6281 Muscle weakness (generalized): Secondary | ICD-10-CM | POA: Diagnosis not present

## 2020-11-25 NOTE — Therapy (Signed)
[place], [place], [address] Phone: [phone]   Fax:  [phone]  Physical Therapy Treatment  Patient Details  Name: Brenda Hernandez MRN: [id number] Date of Birth: Jun 09, 1975 Referring Provider (PT): Cline Crock, PA-C   Encounter Date: 11/25/2020   PT End of Session - 11/25/20 1458     Visit Number 12    Number of Visits 17    Date for PT Re-Evaluation 12/07/20    Authorization Type [employer]    Authorization Time Period 7/27-9/30/22    Authorization - Visit Number 11    Authorization - Number of Visits 16    Progress Note Due on Visit 21    PT Start Time 7939    PT Stop Time 1539    PT Time Calculation (min) 41 min    Activity Tolerance Patient tolerated treatment well    Behavior During Therapy [facility] for tasks assessed/performed             Past Medical History:  Diagnosis Date   Abnormal pap    Bipolar 1 disorder ([place])    GERD (gastroesophageal reflux disease)    Pre-diabetes    Schizophrenia ([place])    Swallowing difficulty     History reviewed. No pertinent surgical history.  There were no vitals filed for this visit.   Subjective Assessment - 11/25/20 1458     Subjective Patient had f/u with referring provider who has suggested surgery, though patient unable to provide further information regarding specific of surgical recomendation. She has f/u again in October and was recommended to attend PT until she has f/u. She reports some pain in the knee currently attributing it to being on the knee more this weekend without her walker.    Currently in Pain? Yes    Pain Score 5     Pain Location Knee    Pain Orientation Left;Anterior    Pain Descriptors / Indicators Discomfort    Pain Type Chronic pain    Pain Onset More than a month ago    Pain Frequency Intermittent    Aggravating Factors  walking, standing    Pain Relieving Factors rest                                [facility] Adult PT Treatment/Exercise - 11/25/20 0001       Ambulation/Gait   Ambulation/Gait Yes    Assistive device Straight cane    Stairs Yes    Stairs Assistance 6: Modified independent (Device/Increase time)    Stair Management Technique Step to pattern    Number of Stairs 6    Height of Stairs 6    Gait Comments focusing on step through and step to utilizing [facility]      Self-Care   Other Self-Care Comments  see patient education      Knee/Hip Exercises: Aerobic   Nustep 5 minutes level 5 UE/LE                     PT Education - 11/25/20 1540     Education Details Education on where to purchase a cane and how to determine appropriate height. Education on appropriate pattern with AD and without AD for stair negotation. Reviewed HEP.    Person(s) Educated Patient    Methods Explanation;Demonstration;Verbal cues;Handout    Comprehension Verbalized understanding;Returned demonstration;Verbal cues required;Need further instruction  PT Short Term Goals - 11/11/20 1513       PT SHORT TERM GOAL #1   Title Patient will be independent with initial HEP.    Baseline issued at eval.    Time 2    Period Weeks    Status Achieved    Target Date 10/24/20      PT SHORT TERM GOAL #2   Title Therapist will review FOTO score and anticipated functional progress.    Baseline FOTO captured; reviewed 8/15    Time 2    Period Weeks    Status Achieved    Target Date 10/24/20      PT SHORT TERM GOAL #3   Title Patient will complete TUG in less than 20 seconds indicative of improvement in gait speed.    Baseline see flowsheet    Time 4    Period Weeks    Status Achieved    Target Date 11/07/20      PT SHORT TERM GOAL #4   Title Patient will maintain Lt SLS for at least 5 seconds to improve stability necessary to reduce need for AD.    Baseline 1 second; 6 seconds LLE    Time 4    Period Weeks    Status Achieved     Target Date 11/07/20               PT Long Term Goals - 11/20/20 1626       PT LONG TERM GOAL #1   Title Patient will demonstrate at least 4/5 bilateral hip abductor and extensor strength to improve stability about the chain with prolonged walking/standing.    Baseline see flowsheet    Time 8    Period Weeks    Status Partially Met      PT LONG TERM GOAL #2   Title Patient will be able to ascend/descend stairs utilizing reciprocal pattern.    Baseline utilizes reciprocal pattern    Time 8    Period Weeks    Status Achieved      PT LONG TERM GOAL #3   Title Patient will ambulate with step through pattern utilizing LRAD.    Baseline step through, continues to utilize RW during the week (less use on the weekends)    Time 8    Period Weeks    Status Achieved      PT LONG TERM GOAL #4   Title Patient will score at least 56% function on FOTO to signify clinically meaningful improvement in functional abilities.    Baseline 26; 69% 11/20/20    Time 8    Period Weeks    Status Achieved                   Plan - 11/25/20 1510     Clinical Impression Statement Session focused on gait training and stair negotation with SPC. She reports feeling stable when utilizing cane and no increased pain reported. She initially demonstrates step to pattern, but with continued practice she is able to demonstrate step through pattern with moderate lateral trunk lean present. With stair negotiation she required consistent cues for appropriate cane placement during ascent, so likely requires continued reinforcement at future sessions. She was recommended to begin utilizing a cane for household and community ambulation at this time.    PT Treatment/Interventions ADLs/Self Care Home Management;Aquatic Therapy;Cryotherapy;Dentist;Therapeutic activities;Therapeutic exercise;Balance training;Neuromuscular re-education;Patient/family  education;Manual techniques;Passive range of motion;Dry needling;Taping;Vasopneumatic Device    PT Next Visit Plan  hip strengthening    PT Home Exercise Plan Access Code: [email or web address]    Consulted and Agree with Plan of Care Patient             Patient will benefit from skilled therapeutic intervention in order to improve the following deficits and impairments:  Abnormal gait, Difficulty walking, Decreased endurance, Decreased activity tolerance, Pain, Improper body mechanics, Impaired flexibility, Decreased balance, Decreased strength  Visit Diagnosis: Chronic pain of left knee  Muscle weakness (generalized)  Difficulty in walking, not elsewhere classified  Other abnormalities of gait and mobility     Problem List Patient Active Problem List   Diagnosis Date Noted   Fatigue 09/23/2020   Class 3 severe obesity with serious comorbidity and body mass index (BMI) of 50.0 to 59.9 in adult ([place]) 04/28/2020   Knee pain, left 04/23/2020   Diabetes mellitus ([place]) 02/19/2020   Schizoaffective disorder ([place]) 02/19/2020   Vitamin D deficiency 02/19/2020   Low serum vitamin B12 02/19/2020   Low back pain 02/19/2020   Knee pain, right 02/19/2020   BIPOLAR AFFECTIVE DISORDER 03/22/2009   GERD 03/22/2009   Gwendolyn Grant, PT, DPT, ATC 11/25/20 3:45 PM  [facility] [address] [place], [place], [address] Phone: [phone]   Fax:  [phone]  Name: Brenda Hernandez MRN: [id number] Date of Birth: 01/26/1976

## 2020-11-25 NOTE — Telephone Encounter (Signed)
error 

## 2020-11-27 ENCOUNTER — Ambulatory Visit (HOSPITAL_BASED_OUTPATIENT_CLINIC_OR_DEPARTMENT_OTHER): Payer: Medicare HMO | Attending: Physician Assistant | Admitting: Physical Therapy

## 2020-11-27 ENCOUNTER — Other Ambulatory Visit: Payer: Self-pay

## 2020-11-27 ENCOUNTER — Encounter (HOSPITAL_BASED_OUTPATIENT_CLINIC_OR_DEPARTMENT_OTHER): Payer: Self-pay | Admitting: Physical Therapy

## 2020-11-27 DIAGNOSIS — M25662 Stiffness of left knee, not elsewhere classified: Secondary | ICD-10-CM | POA: Diagnosis not present

## 2020-11-27 DIAGNOSIS — M25562 Pain in left knee: Secondary | ICD-10-CM | POA: Diagnosis not present

## 2020-11-27 DIAGNOSIS — G8929 Other chronic pain: Secondary | ICD-10-CM | POA: Diagnosis not present

## 2020-11-27 DIAGNOSIS — R600 Localized edema: Secondary | ICD-10-CM | POA: Insufficient documentation

## 2020-11-27 DIAGNOSIS — M6281 Muscle weakness (generalized): Secondary | ICD-10-CM | POA: Diagnosis not present

## 2020-11-27 DIAGNOSIS — R262 Difficulty in walking, not elsewhere classified: Secondary | ICD-10-CM

## 2020-11-27 DIAGNOSIS — R2689 Other abnormalities of gait and mobility: Secondary | ICD-10-CM | POA: Diagnosis not present

## 2020-11-27 NOTE — Therapy (Signed)
[place], [place], [address] Phone: [phone]   Fax:  [phone]  Physical Therapy Treatment  Patient Details  Name: Brenda Hernandez MRN: [id number] Date of Birth: 12/27/1975 Referring Provider (PT): Cline Crock, PA-C   Encounter Date: 11/27/2020   PT End of Session - 11/27/20 1750     Visit Number 13    Number of Visits 17    Date for PT Re-Evaluation 12/07/20    Authorization Type [employer]    Authorization Time Period 7/27-9/30/22    Authorization - Visit Number 11    Authorization - Number of Visits 16    Progress Note Due on Visit 21    PT Start Time 0086    PT Stop Time 1745    PT Time Calculation (min) 20 min    Equipment Utilized During Treatment Other (comment)    Activity Tolerance Patient tolerated treatment well    Behavior During Therapy [facility] for tasks assessed/performed             Past Medical History:  Diagnosis Date   Abnormal pap    Bipolar 1 disorder ([place])    GERD (gastroesophageal reflux disease)    Pre-diabetes    Schizophrenia ([place])    Swallowing difficulty     History reviewed. No pertinent surgical history.  There were no vitals filed for this visit.   Subjective Assessment - 11/27/20 1748     Subjective "Feeling good.  Saw MD.  Wants me to continue with therapy, need to leave early today"                                          PT Short Term Goals - 11/11/20 1513       PT SHORT TERM GOAL #1   Title Patient will be independent with initial HEP.    Baseline issued at eval.    Time 2    Period Weeks    Status Achieved    Target Date 10/24/20      PT SHORT TERM GOAL #2   Title Therapist will review FOTO score and anticipated functional progress.    Baseline FOTO captured; reviewed 8/15    Time 2    Period Weeks    Status Achieved    Target Date 10/24/20      PT SHORT TERM GOAL #3   Title Patient will  complete TUG in less than 20 seconds indicative of improvement in gait speed.    Baseline see flowsheet    Time 4    Period Weeks    Status Achieved    Target Date 11/07/20      PT SHORT TERM GOAL #4   Title Patient will maintain Lt SLS for at least 5 seconds to improve stability necessary to reduce need for AD.    Baseline 1 second; 6 seconds LLE    Time 4    Period Weeks    Status Achieved    Target Date 11/07/20               PT Long Term Goals - 11/20/20 1626       PT LONG TERM GOAL #1   Title Patient will demonstrate at least 4/5 bilateral hip abductor and extensor strength to improve stability about the chain with prolonged walking/standing.    Baseline see flowsheet    Time  8    Period Weeks    Status Partially Met      PT LONG TERM GOAL #2   Title Patient will be able to ascend/descend stairs utilizing reciprocal pattern.    Baseline utilizes reciprocal pattern    Time 8    Period Weeks    Status Achieved      PT LONG TERM GOAL #3   Title Patient will ambulate with step through pattern utilizing LRAD.    Baseline step through, continues to utilize RW during the week (less use on the weekends)    Time 8    Period Weeks    Status Achieved      PT LONG TERM GOAL #4   Title Patient will score at least 56% function on FOTO to signify clinically meaningful improvement in functional abilities.    Baseline 26; 69% 11/20/20    Time 8    Period Weeks    Status Achieved           Pt seen for aquatic therapy today.  Treatment took place in water 3.25-4.8 ft in depth at the [facility] pool. Temp of water was 94.  Pt entered/exited the pool via stairs step to pattern independently with bilat rail.    Pt with laminated HEP.  Added exercise   Seated Flutter kicking x 20 seconds x 3..  VC for tightening of abdominals and glutes for core stability.     Standing Forward lunges x3 widths Step ups leading with each LE  x 10 reps. Holding to pool side:  hip flex 2x10 -hip extension 2x10 -add/abd 2x10 VC for tightened core   Gait training 6 widths of pool without UE support.  VC for improved step length, arm swing and heel strike. Pt with difficulty coordinating arm swing. Stair climbing: 2 trials of 5 steps using reciprocal step pattern and handrails. Slightly antalgic.    Pt requires buoyancy for support and to offload joints with strengthening exercises. Viscosity of the water is needed for resistance of strengthening; water current perturbations provides challenge to standing balance unsupported, requiring increased core activation.           Plan - 11/27/20 1751     Clinical Impression Statement Pt late for session then had to leave early. Focus on Aquatic HEP to ensure understanding and follow through. Pt completes with vc and demonstration.  She arrives to pool in wc pushed by father but amb about pool deck, in and out of bathroom without ad improved from using fww at all times. Gait training in pool with cues for heel strike and toe off as well as arm swing. Amb without ue support. Pt negotiates water step using recipricol step pattern.  No more aquatic session scheduled. Pt has progressed well improving in strength, balance and aerobic capacity.  She will continue to benefit from intervention in pool following HEP or with therapy as appropriate    PT Treatment/Interventions ADLs/Self Care Home Management;Aquatic Therapy;Cryotherapy;Dentist;Therapeutic activities;Therapeutic exercise;Balance training;Neuromuscular re-education;Patient/family education;Manual techniques;Passive range of motion;Dry needling;Taping;Vasopneumatic Device    PT Next Visit Plan land assessment.    PT Home Exercise Plan Access Code: [email or web address]             Patient will benefit from skilled therapeutic intervention in order to improve the following deficits and impairments:  Abnormal gait, Difficulty  walking, Decreased endurance, Decreased activity tolerance, Pain, Improper body mechanics, Impaired flexibility, Decreased balance, Decreased strength  Visit Diagnosis: Chronic pain of left  knee  Muscle weakness (generalized)  Difficulty in walking, not elsewhere classified  Other abnormalities of gait and mobility  Acute pain of left knee  Stiffness of left knee, not elsewhere classified  Localized edema     Problem List Patient Active Problem List   Diagnosis Date Noted   Fatigue 09/23/2020   Class 3 severe obesity with serious comorbidity and body mass index (BMI) of 50.0 to 59.9 in adult ([place]) 04/28/2020   Knee pain, left 04/23/2020   Diabetes mellitus ([place]) 02/19/2020   Schizoaffective disorder ([place]) 02/19/2020   Vitamin D deficiency 02/19/2020   Low serum vitamin B12 02/19/2020   Low back pain 02/19/2020   Knee pain, right 02/19/2020   BIPOLAR AFFECTIVE DISORDER 03/22/2009   GERD 03/22/2009    Annamarie Major) Mirely Pangle MPT  11/27/2020, 6:12 PM  [place], [place], [address] Phone: [phone]   Fax:  [phone]  Name: Brenda Hernandez MRN: [id number] Date of Birth: Aug 31, 1975

## 2020-11-30 ENCOUNTER — Other Ambulatory Visit (HOSPITAL_COMMUNITY)
Admission: RE | Admit: 2020-11-30 | Discharge: 2020-11-30 | Disposition: A | Discharge status: Home / Self Care | Payer: Medicare HMO | Attending: Psychiatry | Admitting: Psychiatry

## 2020-11-30 DIAGNOSIS — F259 Schizoaffective disorder, unspecified: Secondary | ICD-10-CM | POA: Diagnosis not present

## 2020-11-30 DIAGNOSIS — Z5181 Encounter for therapeutic drug level monitoring: Secondary | ICD-10-CM | POA: Insufficient documentation

## 2020-11-30 DIAGNOSIS — Z79899 Other long term (current) drug therapy: Secondary | ICD-10-CM | POA: Diagnosis not present

## 2020-11-30 LAB — CBC WITH DIFFERENTIAL/PLATELET
Abs Immature Granulocytes: 0.02 10*3/uL (ref 0.00–0.07)
Basophils Absolute: 0 10*3/uL (ref 0.0–0.1)
Basophils Relative: 0 %
Eosinophils Absolute: 0 10*3/uL (ref 0.0–0.5)
Eosinophils Relative: 0 %
HCT: 35.8 % — ABNORMAL LOW (ref 36.0–46.0)
Hemoglobin: 12.4 g/dL (ref 12.0–15.0)
Immature Granulocytes: 0 %
Lymphocytes Relative: 36 %
Lymphs Abs: 2.7 10*3/uL (ref 0.7–4.0)
MCH: 30.5 pg (ref 26.0–34.0)
MCHC: 34.6 g/dL (ref 30.0–36.0)
MCV: 88.2 fL (ref 80.0–100.0)
Monocytes Absolute: 0.4 10*3/uL (ref 0.1–1.0)
Monocytes Relative: 6 %
Neutro Abs: 4.4 10*3/uL (ref 1.7–7.7)
Neutrophils Relative %: 58 %
Platelets: 307 10*3/uL (ref 150–400)
RBC: 4.06 MIL/uL (ref 3.87–5.11)
RDW: 14.8 % (ref 11.5–15.5)
WBC: 7.6 10*3/uL (ref 4.0–10.5)
nRBC: 0 % (ref 0.0–0.2)

## 2020-12-02 ENCOUNTER — Other Ambulatory Visit: Payer: Self-pay

## 2020-12-02 ENCOUNTER — Ambulatory Visit: Payer: Medicare HMO

## 2020-12-02 DIAGNOSIS — R262 Difficulty in walking, not elsewhere classified: Secondary | ICD-10-CM | POA: Diagnosis not present

## 2020-12-02 DIAGNOSIS — G8929 Other chronic pain: Secondary | ICD-10-CM | POA: Diagnosis not present

## 2020-12-02 DIAGNOSIS — M6281 Muscle weakness (generalized): Secondary | ICD-10-CM | POA: Diagnosis not present

## 2020-12-02 DIAGNOSIS — R2689 Other abnormalities of gait and mobility: Secondary | ICD-10-CM

## 2020-12-02 DIAGNOSIS — M25562 Pain in left knee: Secondary | ICD-10-CM

## 2020-12-02 NOTE — Therapy (Signed)
[place], [place], [address] Phone: [phone]   Fax:  [phone]  Physical Therapy Treatment/Re-certification  Patient Details  Name: Brenda Hernandez MRN: [id number] Date of Birth: 05-Nov-1975 Referring Provider (PT): Esmond Plants   Encounter Date: 12/02/2020   PT End of Session - 12/02/20 1609     Visit Number 14    Number of Visits 16    Date for PT Re-Evaluation 12/21/20    Authorization Type [employer]    Authorization Time Period 7/27-9/30/22    Authorization - Visit Number 12    Authorization - Number of Visits 16    Progress Note Due on Visit 21    PT Start Time 1093    PT Stop Time 1655    PT Time Calculation (min) 45 min    Activity Tolerance Patient tolerated treatment well;Patient limited by fatigue;Other (comment)   dizziness   Behavior During Therapy WFL for tasks assessed/performed             Past Medical History:  Diagnosis Date   Abnormal pap    Bipolar 1 disorder ([place])    GERD (gastroesophageal reflux disease)    Pre-diabetes    Schizophrenia ([place])    Swallowing difficulty     History reviewed. No pertinent surgical history.  There were no vitals filed for this visit.   Subjective Assessment - 12/02/20 1614     Subjective Patient reports the knee is feeling ok. She reports mostly feeling tired when she is up walking for awhile as opposed to pain. She has f/u with Dr. [name] on 12/19/20. No pain currently, but starts to have pain if she is standing/walking longer than an hour.    How long can you sit comfortably? no issues    How long can you stand comfortably? about 45 minutes-1 hour    How long can you walk comfortably? 45 minutes-1 hour    Currently in Pain? No/denies                [facility] PT Assessment - 12/02/20 0001       Assessment   Medical Diagnosis pain of left knee joint    Referring Provider (PT) Esmond Plants      Strength   Right Hip Flexion 5/5     Right Hip Extension 3+/5    Right Hip ABduction 4/5    Left Hip Flexion 5/5    Left Hip Extension 4-/5    Left Hip ABduction 4/5      Ambulation/Gait   Ambulation/Gait Yes    Gait Comments No AD, left lateral trunk lean, foot inversion Lt, limited push-off Lt, decreased stance time Lt                           [facility] Adult PT Treatment/Exercise - 12/02/20 0001       Self-Care   Other Self-Care Comments  see patient education      Knee/Hip Exercises: Aerobic   Tread Mill 0.5 2 minutes (focusing on gait training)    Nustep 5 minutes level 5 UE/LE      Knee/Hip Exercises: Standing   Gait Training 185 ft with SPC focusing on arm swing, cane positioning, decreasing lateral trunk lean      Knee/Hip Exercises: Seated   Sit to Sand 10 reps;2 sets   5 lb kettlebell  PT Education - 12/02/20 1659     Education Details Education on re-assessment findings and POC moving forward. Education on updated HEP and implementation of walking program to build aerobic endurance. Recommendation to utilize [facility] for community ambulation and discontinue use of AD for household ambulation.    Person(s) Educated Patient    Methods Explanation;Demonstration;Verbal cues;Handout    Comprehension Verbalized understanding;Verbal cues required;Returned demonstration              PT Short Term Goals - 11/11/20 1513       PT SHORT TERM GOAL #1   Title Patient will be independent with initial HEP.    Baseline issued at eval.    Time 2    Period Weeks    Status Achieved    Target Date 10/24/20      PT SHORT TERM GOAL #2   Title Therapist will review FOTO score and anticipated functional progress.    Baseline FOTO captured; reviewed 8/15    Time 2    Period Weeks    Status Achieved    Target Date 10/24/20      PT SHORT TERM GOAL #3   Title Patient will complete TUG in less than 20 seconds indicative of improvement in gait speed.    Baseline see  flowsheet    Time 4    Period Weeks    Status Achieved    Target Date 11/07/20      PT SHORT TERM GOAL #4   Title Patient will maintain Lt SLS for at least 5 seconds to improve stability necessary to reduce need for AD.    Baseline 1 second; 6 seconds LLE    Time 4    Period Weeks    Status Achieved    Target Date 11/07/20               PT Long Term Goals - 12/02/20 1701       PT LONG TERM GOAL #1   Title Patient will demonstrate at least 4/5 bilateral hip abductor and extensor strength to improve stability about the chain with prolonged walking/standing.    Baseline see flowsheet    Time 8    Period Weeks    Status Partially Met      PT LONG TERM GOAL #2   Title Patient will be able to ascend/descend stairs utilizing reciprocal pattern.    Baseline utilizes reciprocal pattern    Time 8    Period Weeks    Status Achieved      PT LONG TERM GOAL #3   Title Patient will ambulate with step through pattern utilizing LRAD.    Baseline step through without use of AD    Time 8    Period Weeks    Status Achieved      PT LONG TERM GOAL #4   Title Patient will score at least 56% function on FOTO to signify clinically meaningful improvement in functional abilities.    Baseline 26; 69% 11/20/20    Time 8    Period Weeks    Status Achieved      PT LONG TERM GOAL #5   Title patient will be independent with advanced home program to maintain/progress her current level of function.    Baseline progressing as appropriate    Status New    Target Date 12/21/20                   Plan - 12/02/20 1635  Clinical Impression Statement Patient is progressing well having met the majority of her established functional goals. Dr. [name] has sent over an additional referral recommending continuing with PT for further gait, balance and strength training prior to her next f/u with him in October. She continues to have weakness about bilateral hips Lt>Rt. Her gait mechanics are  improving with ability to complete step through pattern without use of assistive device though her gait remains antalgic with significant lateral trunk lean and limited stance time and push-off on the LLE. She will benefit from continued skilled PT 1xweek for 2 additional weaks to address the above lingering deficits and train in advanced home program in order to improve her overall functional mobility. She reported dizziness within 2 minutes of treadmill walking that was reduced with seated break and water, though no reports of knee pain. She was able to ambulate 185 ft focusing on improving gait mechanics, though she reports feeling tired halfway through the walk requiring encouragement to finish lap around therapy gym.    PT Frequency 1x / week    PT Duration 2 weeks    PT Treatment/Interventions ADLs/Self Care Home Management;Aquatic Therapy;Cryotherapy;Dentist;Therapeutic activities;Therapeutic exercise;Balance training;Neuromuscular re-education;Patient/family education;Manual techniques;Passive range of motion;Dry needling;Taping;Vasopneumatic Device    PT Next Visit Plan update HEP, gait training without AD    PT Home Exercise Plan Access Code: [email or web address]    Consulted and Agree with Plan of Care Patient             Patient will benefit from skilled therapeutic intervention in order to improve the following deficits and impairments:  Abnormal gait, Difficulty walking, Decreased endurance, Decreased activity tolerance, Pain, Improper body mechanics, Impaired flexibility, Decreased balance, Decreased strength  Visit Diagnosis: Chronic pain of left knee  Muscle weakness (generalized)  Difficulty in walking, not elsewhere classified  Other abnormalities of gait and mobility     Problem List Patient Active Problem List   Diagnosis Date Noted   Fatigue 09/23/2020   Class 3 severe obesity with serious comorbidity and body mass index  (BMI) of 50.0 to 59.9 in adult ([place]) 04/28/2020   Knee pain, left 04/23/2020   Diabetes mellitus ([place]) 02/19/2020   Schizoaffective disorder ([place]) 02/19/2020   Vitamin D deficiency 02/19/2020   Low serum vitamin B12 02/19/2020   Low back pain 02/19/2020   Knee pain, right 02/19/2020   BIPOLAR AFFECTIVE DISORDER 03/22/2009   GERD 03/22/2009   Referring diagnosis? pain of left knee joint  Treatment diagnosis? (if different than referring diagnosis)  Chronic pain of left knee  Muscle weakness (generalized)  Difficulty in walking, not elsewhere classified  Other abnormalities of gait and mobility What was this (referring dx) caused by? '[]'  Surgery '[x]'  Fall '[]'  Ongoing issue '[]'  Arthritis '[]'  Other: ____________  Laterality: '[]'  Rt '[x]'  Lt '[]'  Both  Check all possible CPT codes:      '[x]'  97110 (Therapeutic Exercise)  '[]'  92507 (SLP Treatment)  '[x]'  97112 (Neuro Re-ed)   '[]'  81103 (Swallowing Treatment)   '[x]'  97116 (Gait Training)   '[]'  [id number] (Cognitive Training, 1st 15 minutes) '[x]'  97140 (Manual Therapy)   '[]'  97130 (Cognitive Training, each add'l 15 minutes)  '[x]'  97530 (Therapeutic Activities)  '[]'  Other, List CPT Code ____________    '[x]'  15945 (Self Care)       '[x]'  All codes above (97110 - 97535)  '[]'  97012 (Mechanical Traction)  '[]'  97014 (E-stim Unattended)  '[]'  97032 (E-stim manual)  '[]'  97033 (Ionto)  '[]'  97035 (Ultrasound)  '[]'   97760 Therapist, art) '[]'  [phone] (Physical Performance Training) '[x]'  06986 (Aquatic Therapy) '[]'  [phone] (Contrast Bath) '[]'  [id number] (Paraffin) '[]'  97597 (Wound Care 1st 20 sq cm) '[]'  97598 (Wound Care each add'l 20 sq cm) '[]'  97016 (Vasopneumatic Device) '[]'  [phone] (Orthotic Training) '[]'  [phone] (Prosthetic Training) Gwendolyn Grant, PT, DPT, ATC 12/02/20 5:06 PM  [facility] [address] [place], [place], [address] Phone: [phone]   Fax:  [phone]  Name: Brenda Hernandez MRN: [id number] Date of Birth:  07-Jul-1975

## 2020-12-04 ENCOUNTER — Ambulatory Visit (HOSPITAL_BASED_OUTPATIENT_CLINIC_OR_DEPARTMENT_OTHER): Payer: Medicare HMO | Admitting: Physical Therapy

## 2020-12-05 ENCOUNTER — Encounter (INDEPENDENT_AMBULATORY_CARE_PROVIDER_SITE_OTHER): Payer: Self-pay | Admitting: Family Medicine

## 2020-12-05 ENCOUNTER — Ambulatory Visit (INDEPENDENT_AMBULATORY_CARE_PROVIDER_SITE_OTHER): Payer: Medicare HMO | Admitting: Family Medicine

## 2020-12-05 ENCOUNTER — Other Ambulatory Visit: Payer: Self-pay

## 2020-12-05 VITALS — BP 125/88 | HR 94 | Temp 98.0°F | Ht 63.0 in | Wt 267.0 lb

## 2020-12-05 DIAGNOSIS — E785 Hyperlipidemia, unspecified: Secondary | ICD-10-CM | POA: Diagnosis not present

## 2020-12-05 DIAGNOSIS — R7303 Prediabetes: Secondary | ICD-10-CM

## 2020-12-05 DIAGNOSIS — E559 Vitamin D deficiency, unspecified: Secondary | ICD-10-CM

## 2020-12-05 DIAGNOSIS — E7849 Other hyperlipidemia: Secondary | ICD-10-CM | POA: Diagnosis not present

## 2020-12-05 DIAGNOSIS — Z6841 Body Mass Index (BMI) 40.0 and over, adult: Secondary | ICD-10-CM | POA: Diagnosis not present

## 2020-12-05 NOTE — Progress Notes (Signed)
Chief Complaint:   OBESITY Brenda Hernandez is here to discuss her progress with her obesity treatment plan along with follow-up of her obesity related diagnoses. Brenda Hernandez is on the Category 3 Plan and states she is following her eating plan approximately 50% of the time. Brenda Hernandez states she is doing strengthening for 20-25 minutes 7 times per week.  Today's visit was #: 14 Starting weight: 287 lbs Starting date: 03/26/2020 Today's weight: 267 lbs Today's date: 12/05/2020 Total lbs lost to date: 20 Total lbs lost since last in-office visit: 0  Interim History: Brenda Hernandez has gained a significant amount of weight in there last 4 weeks. Her exercise is limited due to a knee injury. She is keeping track of what she is eating but not her portions, calories, or protein.  Subjective:   1. Pre-diabetes Brenda Hernandez is on metformin, and she is struggling with her eating plan and weight loss. She is due for labs.  2. Other hyperlipidemia Brenda Hernandez is due for labs, and she is working on diet and weight loss.  3. Vitamin D deficiency Brenda Hernandez is not on Vit D, and her last level was at goal and at the high end of normal.  Assessment/Plan:   1. Pre-diabetes We will check labs today. Brenda Hernandez will continue metformin and weight loss to help decrease the risk of diabetes.   - CMP14+EGFR - Hemoglobin A1c - Insulin, random  2. Other hyperlipidemia Cardiovascular risk and specific lipid/LDL goals reviewed. We discussed several lifestyle modifications today. We will check labs today and will follow up at her visit. Brenda Hernandez will continue to work on diet, exercise and weight loss efforts. Orders and follow up as documented in patient record.   - Lipid Panel With LDL/HDL Ratio  3. Vitamin D deficiency Low Vitamin D level contributes to fatigue and are associated with obesity, breast, and colon cancer. We will check labs today. Brenda Hernandez will follow-up for routine testing of Vitamin D, at least 2-3 times per year to  avoid over-replacement.  - VITAMIN D 25 Hydroxy (Vit-D Deficiency, Fractures)  4. Obesity: Current BMI 47.4 Brenda Hernandez is currently in the action stage of change. As such, her goal is to continue with weight loss efforts. She has agreed to keeping a food journal and adhering to recommended goals of 1500 calories and 90+ grams of protein daily.   Exercise goals: As is.  Behavioral modification strategies: keeping a strict food journal.  Brenda Hernandez has agreed to follow-up with our clinic in 4 weeks. She was informed of the importance of frequent follow-up visits to maximize her success with intensive lifestyle modifications for her multiple health conditions.   Brenda Hernandez was informed we would discuss her lab results at her next visit unless there is a critical issue that needs to be addressed sooner. Brenda Hernandez agreed to keep her next visit at the agreed upon time to discuss these results.  Objective:   Blood pressure 125/88, pulse 94, temperature 98 F (36.7 C), height '5\' 3"'  (1.6 m), weight 267 lb (121.1 kg), SpO2 98 %. Body mass index is 47.3 kg/m.  General: Cooperative, alert, well developed, in no acute distress. HEENT: Conjunctivae and lids unremarkable. Cardiovascular: Regular rhythm.  Lungs: Normal work of breathing. Neurologic: No focal deficits.   Lab Results  Component Value Date   CREATININE 0.74 08/03/2020   BUN 10 08/03/2020   NA 140 08/03/2020   K 3.8 08/03/2020   CL 107 08/03/2020   CO2 25 08/03/2020   Lab Results  Component Value Date  ALT 13 08/03/2020   AST 18 08/03/2020   ALKPHOS 60 08/03/2020   BILITOT 0.5 08/03/2020   Lab Results  Component Value Date   HGBA1C 5.7 (H) 08/03/2020   HGBA1C 5.6 07/18/2020   HGBA1C 5.8 (H) 03/26/2020   HGBA1C 5.4 02/03/2020   HGBA1C 5.5 07/29/2019   Lab Results  Component Value Date   INSULIN 10.8 07/18/2020   INSULIN 24.7 03/26/2020   Lab Results  Component Value Date   TSH 1.55 09/23/2020   Lab Results  Component  Value Date   CHOL 194 08/03/2020   HDL 57 08/03/2020   LDLCALC 117 (H) 08/03/2020   TRIG 100 08/03/2020   CHOLHDL 3.4 08/03/2020   Lab Results  Component Value Date   VD25OH 91.02 08/03/2020   VD25OH 68.8 07/18/2020   VD25OH 33.0 03/26/2020   Lab Results  Component Value Date   WBC 7.6 11/30/2020   HGB 12.4 11/30/2020   HCT 35.8 (L) 11/30/2020   MCV 88.2 11/30/2020   PLT 307 11/30/2020   No results found for: IRON, TIBC, FERRITIN  Obesity Behavioral Intervention:   Approximately 15 minutes were spent on the discussion below.  ASK: We discussed the diagnosis of obesity with Brenda Hernandez today and Brenda Hernandez agreed to give [place] permission to discuss obesity behavioral modification therapy today.  ASSESS: Brenda Hernandez has the diagnosis of obesity and her BMI today is 47.4. Brenda Hernandez is in the action stage of change.   ADVISE: Brenda Hernandez was educated on the multiple health risks of obesity as well as the benefit of weight loss to improve her health. She was advised of the need for long term treatment and the importance of lifestyle modifications to improve her current health and to decrease her risk of future health problems.  AGREE: Multiple dietary modification options and treatment options were discussed and Brenda Hernandez agreed to follow the recommendations documented in the above note.  ARRANGE: Brenda Hernandez was educated on the importance of frequent visits to treat obesity as outlined per CMS and USPSTF guidelines and agreed to schedule her next follow up appointment today.  Attestation Statements:   Reviewed by clinician on day of visit: allergies, medications, problem list, medical history, surgical history, family history, social history, and previous encounter notes.   I, Trixie Dredge, am acting as transcriptionist for Dennard Nip, MD.  I have reviewed the above documentation for accuracy and completeness, and I agree with the above. -  Dennard Nip, MD

## 2020-12-06 LAB — CMP14+EGFR
ALT: 9 IU/L (ref 0–32)
AST: 13 IU/L (ref 0–40)
Albumin/Globulin Ratio: 1.1 — ABNORMAL LOW (ref 1.2–2.2)
Albumin: 3.5 g/dL — ABNORMAL LOW (ref 3.8–4.8)
Alkaline Phosphatase: 67 IU/L (ref 44–121)
BUN/Creatinine Ratio: 13 (ref 9–23)
BUN: 12 mg/dL (ref 6–24)
Bilirubin Total: 0.2 mg/dL (ref 0.0–1.2)
CO2: 21 mmol/L (ref 20–29)
Calcium: 8.5 mg/dL — ABNORMAL LOW (ref 8.7–10.2)
Chloride: 101 mmol/L (ref 96–106)
Creatinine, Ser: 0.9 mg/dL (ref 0.57–1.00)
Globulin, Total: 3.1 g/dL (ref 1.5–4.5)
Glucose: 79 mg/dL (ref 65–99)
Potassium: 4.2 mmol/L (ref 3.5–5.2)
Sodium: 140 mmol/L (ref 134–144)
Total Protein: 6.6 g/dL (ref 6.0–8.5)
eGFR: 81 mL/min/{1.73_m2} (ref >59)

## 2020-12-06 LAB — LIPID PANEL WITH LDL/HDL RATIO
Cholesterol, Total: 183 mg/dL (ref 100–199)
HDL: 58 mg/dL (ref >39)
LDL Chol Calc (NIH): 106 mg/dL — ABNORMAL HIGH (ref 0–99)
LDL/HDL Ratio: 1.8 ratio (ref 0.0–3.2)
Triglycerides: 105 mg/dL (ref 0–149)
VLDL Cholesterol Cal: 19 mg/dL (ref 5–40)

## 2020-12-06 LAB — HEMOGLOBIN A1C
Est. average glucose Bld gHb Est-mCnc: 111 mg/dL
Hgb A1c MFr Bld: 5.5 % (ref 4.8–5.6)

## 2020-12-06 LAB — VITAMIN D 25 HYDROXY (VIT D DEFICIENCY, FRACTURES): Vit D, 25-Hydroxy: 46.8 ng/mL (ref 30.0–100.0)

## 2020-12-06 LAB — INSULIN, RANDOM: INSULIN: 6 u[IU]/mL (ref 2.6–24.9)

## 2020-12-11 ENCOUNTER — Ambulatory Visit (HOSPITAL_BASED_OUTPATIENT_CLINIC_OR_DEPARTMENT_OTHER): Payer: Medicare HMO | Admitting: Physical Therapy

## 2020-12-12 ENCOUNTER — Ambulatory Visit: Payer: Medicare HMO

## 2020-12-12 ENCOUNTER — Other Ambulatory Visit: Payer: Self-pay

## 2020-12-12 DIAGNOSIS — R262 Difficulty in walking, not elsewhere classified: Secondary | ICD-10-CM

## 2020-12-12 DIAGNOSIS — M6281 Muscle weakness (generalized): Secondary | ICD-10-CM | POA: Diagnosis not present

## 2020-12-12 DIAGNOSIS — R2689 Other abnormalities of gait and mobility: Secondary | ICD-10-CM

## 2020-12-12 DIAGNOSIS — M25562 Pain in left knee: Secondary | ICD-10-CM

## 2020-12-12 DIAGNOSIS — G8929 Other chronic pain: Secondary | ICD-10-CM

## 2020-12-12 DIAGNOSIS — M79604 Pain in right leg: Secondary | ICD-10-CM | POA: Diagnosis not present

## 2020-12-12 DIAGNOSIS — M79605 Pain in left leg: Secondary | ICD-10-CM | POA: Diagnosis not present

## 2020-12-12 NOTE — Therapy (Signed)
[place], [place], [address] Phone: [phone]   Fax:  [phone]  Physical Therapy Treatment  Patient Details  Name: Brenda Hernandez MRN: [id number] Date of Birth: Jul 15, 1975 Referring Provider (PT): Esmond Plants   Encounter Date: 12/12/2020   PT End of Session - 12/12/20 1229     Visit Number 15    Number of Visits 16    Date for PT Re-Evaluation 12/21/20    Authorization Type [employer]    Authorization Time Period 7/27-9/30/22    Authorization - Visit Number 13    Authorization - Number of Visits 16    Progress Note Due on Visit 21    PT Start Time 1230    PT Stop Time 1313    PT Time Calculation (min) 43 min    Activity Tolerance Patient tolerated treatment well;Patient limited by fatigue    Behavior During Therapy [facility] for tasks assessed/performed             Past Medical History:  Diagnosis Date   Abnormal pap    Bipolar 1 disorder ([place])    GERD (gastroesophageal reflux disease)    Pre-diabetes    Schizophrenia ([place])    Swallowing difficulty     History reviewed. No pertinent surgical history.  There were no vitals filed for this visit.   Subjective Assessment - 12/12/20 1230     Subjective Patient reports mostly using her cane, though occasionally using her RW. She denies any pain currently, just feeling a little tired.    How long can you sit comfortably? no issues    How long can you stand comfortably? about 45 minutes-1 hour    How long can you walk comfortably? 45 minutes-1 hour    Currently in Pain? No/denies                               [facility] Adult PT Treatment/Exercise - 12/12/20 0001       Self-Care   Other Self-Care Comments  see patient education      Knee/Hip Exercises: Aerobic   Nustep 5 minutes level 5 UE/LE      Knee/Hip Exercises: Machines for Strengthening   Total Gym Leg Press 2x10 @ 40 lbs      Knee/Hip Exercises: Standing    Heel Raises 10 reps    Heel Raises Limitations x2    Hip Abduction 10 reps    Abduction Limitations x2; bilateral with green band    Hip Extension 10 reps    Extension Limitations x2; bilateral with green band    Forward Step Up 10 reps    Forward Step Up Limitations x2; 2 inch step bilateral    Other Standing Knee Exercises marching 2 x 10      Knee/Hip Exercises: Seated   Sit to Sand 10 reps;2 sets   5 lb kettlebell                    PT Education - 12/12/20 1314     Education Details Time spent updating HEP and discussing walking program to initiate as part of HEP and [facility]    Person(s) Educated Patient    Methods Explanation;Demonstration;Verbal cues;Handout;Tactile cues    Comprehension Verbalized understanding;Returned demonstration;Verbal cues required;Tactile cues required;Need further instruction              PT Short Term  Goals - 11/11/20 1513       PT SHORT TERM GOAL #1   Title Patient will be independent with initial HEP.    Baseline issued at eval.    Time 2    Period Weeks    Status Achieved    Target Date 10/24/20      PT SHORT TERM GOAL #2   Title Therapist will review FOTO score and anticipated functional progress.    Baseline FOTO captured; reviewed 8/15    Time 2    Period Weeks    Status Achieved    Target Date 10/24/20      PT SHORT TERM GOAL #3   Title Patient will complete TUG in less than 20 seconds indicative of improvement in gait speed.    Baseline see flowsheet    Time 4    Period Weeks    Status Achieved    Target Date 11/07/20      PT SHORT TERM GOAL #4   Title Patient will maintain Lt SLS for at least 5 seconds to improve stability necessary to reduce need for AD.    Baseline 1 second; 6 seconds LLE    Time 4    Period Weeks    Status Achieved    Target Date 11/07/20               PT Long Term Goals - 12/02/20 1701       PT LONG TERM GOAL #1   Title  Patient will demonstrate at least 4/5 bilateral hip abductor and extensor strength to improve stability about the chain with prolonged walking/standing.    Baseline see flowsheet    Time 8    Period Weeks    Status Partially Met      PT LONG TERM GOAL #2   Title Patient will be able to ascend/descend stairs utilizing reciprocal pattern.    Baseline utilizes reciprocal pattern    Time 8    Period Weeks    Status Achieved      PT LONG TERM GOAL #3   Title Patient will ambulate with step through pattern utilizing LRAD.    Baseline step through without use of AD    Time 8    Period Weeks    Status Achieved      PT LONG TERM GOAL #4   Title Patient will score at least 56% function on FOTO to signify clinically meaningful improvement in functional abilities.    Baseline 26; 69% 11/20/20    Time 8    Period Weeks    Status Achieved      PT LONG TERM GOAL #5   Title patient will be independent with advanced home program to maintain/progress her current level of function.    Baseline progressing as appropriate    Status New    Target Date 12/21/20                   Plan - 12/12/20 1236     Clinical Impression Statement Session focused on advancing HEP to include further standing hip and knee strengthening with patient overall tolerating session well. She continues to quickly fatigue with standing exercises and requires consistent cues for pacing and to complete strengthening through full range and to decrease Lt lateral trunk lean. Anticipate D/C at next session pending overall tolerance and independence to advanced home progam that was issued today.    PT Frequency --    PT Duration --    PT  Treatment/Interventions ADLs/Self Care Home Management;Aquatic Therapy;Cryotherapy;Dentist;Therapeutic activities;Therapeutic exercise;Balance training;Neuromuscular re-education;Patient/family education;Manual techniques;Passive range  of motion;Dry needling;Taping;Vasopneumatic Device    PT Next Visit Plan review HEP, anticipate D/C    PT Home Exercise Plan Access Code: [email or web address]    Consulted and Agree with Plan of Care Patient             Patient will benefit from skilled therapeutic intervention in order to improve the following deficits and impairments:  Abnormal gait, Difficulty walking, Decreased endurance, Decreased activity tolerance, Pain, Improper body mechanics, Impaired flexibility, Decreased balance, Decreased strength  Visit Diagnosis: Chronic pain of left knee  Muscle weakness (generalized)  Difficulty in walking, not elsewhere classified  Other abnormalities of gait and mobility     Problem List Patient Active Problem List   Diagnosis Date Noted   Fatigue 09/23/2020   Class 3 severe obesity with serious comorbidity and body mass index (BMI) of 50.0 to 59.9 in adult ([place]) 04/28/2020   Knee pain, left 04/23/2020   Diabetes mellitus ([place]) 02/19/2020   Schizoaffective disorder ([place]) 02/19/2020   Vitamin D deficiency 02/19/2020   Low serum vitamin B12 02/19/2020   Low back pain 02/19/2020   Knee pain, right 02/19/2020   BIPOLAR AFFECTIVE DISORDER 03/22/2009   GERD 03/22/2009   Gwendolyn Grant, PT, DPT, ATC 12/12/20 2:25 PM  [facility] [address] [place], [place], [address] Phone: [phone]   Fax:  [phone]  Name: Brock Mokry MRN: [id number] Date of Birth: 09/06/1975

## 2020-12-16 ENCOUNTER — Other Ambulatory Visit: Payer: Self-pay

## 2020-12-16 ENCOUNTER — Ambulatory Visit: Payer: Medicare HMO | Attending: Physician Assistant

## 2020-12-16 DIAGNOSIS — R2689 Other abnormalities of gait and mobility: Secondary | ICD-10-CM | POA: Insufficient documentation

## 2020-12-16 DIAGNOSIS — M25562 Pain in left knee: Secondary | ICD-10-CM | POA: Diagnosis not present

## 2020-12-16 DIAGNOSIS — G8929 Other chronic pain: Secondary | ICD-10-CM | POA: Diagnosis not present

## 2020-12-16 DIAGNOSIS — R262 Difficulty in walking, not elsewhere classified: Secondary | ICD-10-CM | POA: Insufficient documentation

## 2020-12-16 DIAGNOSIS — M6281 Muscle weakness (generalized): Secondary | ICD-10-CM | POA: Diagnosis not present

## 2020-12-16 NOTE — Therapy (Signed)
[place], [place], [address] Phone: [phone]   Fax:  [phone]  Physical Therapy Treatment/Re-evaluation/Discharge  Patient Details  Name: Brenda Hernandez MRN: [id number] Date of Birth: 1975/11/16 Referring Provider (PT): Esmond Plants   Encounter Date: 12/16/2020   PT End of Session - 12/16/20 1416     Visit Number 16    Number of Visits 16    Date for PT Re-Evaluation 12/21/20    Authorization Type [employer]- out of auth, completing re-evaluation today    Progress Note Due on Visit 21    PT Start Time 1416    PT Stop Time 1437    PT Time Calculation (min) 21 min    Activity Tolerance Patient tolerated treatment well    Behavior During Therapy [facility] for tasks assessed/performed             Past Medical History:  Diagnosis Date   Abnormal pap    Bipolar 1 disorder ([place])    GERD (gastroesophageal reflux disease)    Pre-diabetes    Schizophrenia ([place])    Swallowing difficulty     History reviewed. No pertinent surgical history.  There were no vitals filed for this visit.   Subjective Assessment - 12/16/20 1419     Subjective Patient reports she she was on her feet a lot this weekend without using an AD, so she is having a little more pain in the knee. She has f/u with MD on 12/19/20. She reports compliance with HEP that was updated at last session.    Currently in Pain? Yes    Pain Score 4     Pain Location Knee    Pain Orientation Left;Anterior    Pain Descriptors / Indicators Discomfort    Pain Type Chronic pain    Pain Onset More than a month ago    Pain Frequency Intermittent    Aggravating Factors  walking, standing    Pain Relieving Factors rest                [facility] PT Assessment - 12/16/20 0001       Assessment   Medical Diagnosis pain of left knee joint    Referring Provider (PT) Esmond Plants      AROM   Overall AROM Comments Left knee AROM WNL      Strength    Right Hip Flexion 5/5    Right Hip Extension 3+/5    Right Hip ABduction 4/5    Left Hip Flexion 5/5    Left Hip Extension 3+/5    Left Hip ABduction 4/5    Right Knee Flexion 5/5    Right Knee Extension 5/5    Left Knee Flexion 5/5    Left Knee Extension 5/5      Ambulation/Gait   Assistive device Straight cane    Gait Comments left lateral trunk lean, decreased stance time LLE, foot inversion Lt      Static Standing Balance   Static Standing - Comment/# of Minutes SLS 7 seconds bilaterally      Timed Up and Go Test   Normal TUG (seconds) 13   SPC                                   PT Education - 12/16/20 1438     Education Details Education on re-assessment findings. D/C education. Reviewed HEP. Reviewed appropriate gym equipment to  utilize at the [employer]) Educated Patient    Methods Explanation    Comprehension Verbalized understanding              PT Short Term Goals - 11/11/20 1513       PT SHORT TERM GOAL #1   Title Patient will be independent with initial HEP.    Baseline issued at eval.    Time 2    Period Weeks    Status Achieved    Target Date 10/24/20      PT SHORT TERM GOAL #2   Title Therapist will review FOTO score and anticipated functional progress.    Baseline FOTO captured; reviewed 8/15    Time 2    Period Weeks    Status Achieved    Target Date 10/24/20      PT SHORT TERM GOAL #3   Title Patient will complete TUG in less than 20 seconds indicative of improvement in gait speed.    Baseline see flowsheet    Time 4    Period Weeks    Status Achieved    Target Date 11/07/20      PT SHORT TERM GOAL #4   Title Patient will maintain Lt SLS for at least 5 seconds to improve stability necessary to reduce need for AD.    Baseline 1 second; 6 seconds LLE    Time 4    Period Weeks    Status Achieved    Target Date 11/07/20               PT Long Term Goals - 12/16/20 1423       PT LONG TERM GOAL #1    Title Patient will demonstrate at least 4/5 bilateral hip abductor and extensor strength to improve stability about the chain with prolonged walking/standing.    Baseline see flowsheet    Time 8    Period Weeks    Status Partially Met      PT LONG TERM GOAL #2   Title Patient will be able to ascend/descend stairs utilizing reciprocal pattern.    Baseline utilizes reciprocal pattern    Time 8    Period Weeks    Status Achieved      PT LONG TERM GOAL #3   Title Patient will ambulate with step through pattern utilizing LRAD.    Baseline step through with and without use of AD    Time 8    Period Weeks    Status Achieved      PT LONG TERM GOAL #4   Title Patient will score at least 56% function on FOTO to signify clinically meaningful improvement in functional abilities.    Baseline 26; 69% 11/20/20    Time 8    Period Weeks    Status Achieved      PT LONG TERM GOAL #5   Title patient will be independent with advanced home program to maintain/progress her current level of function.    Status Achieved                   Plan - 12/16/20 1439     Clinical Impression Statement Brenda Hernandez has progressed well since the start of PT having met the majority of established functional goals. She demonstrates improvements in gait speed, gait mechanics, static balance, and BLE strength. She does continue to have an antalgic gait, though has been able to utilize a SPC or no AD with minimal knee pain. She has  lingering hip abductor and extensor weakness that has remained unchanged since previous re-evaluation. She demonstrates independence with advanced home program that was issued at last session. She has met her maximal rehab potential and was encouraged to continue with her land-based and aquatic HEP to further progress her strength/endurance independently.    PT Treatment/Interventions ADLs/Self Care Home Management;Aquatic Therapy;Cryotherapy;Psychiatric nurse;Therapeutic activities;Therapeutic exercise;Balance training;Neuromuscular re-education;Patient/family education;Manual techniques;Passive range of motion;Dry needling;Taping;Vasopneumatic Device    PT Home Exercise Plan Access Code: [email or web address]    Consulted and Agree with Plan of Care Patient             Patient will benefit from skilled therapeutic intervention in order to improve the following deficits and impairments:  Abnormal gait, Difficulty walking, Decreased endurance, Decreased activity tolerance, Pain, Improper body mechanics, Impaired flexibility, Decreased balance, Decreased strength  Visit Diagnosis: Chronic pain of left knee  Muscle weakness (generalized)  Difficulty in walking, not elsewhere classified  Other abnormalities of gait and mobility     Problem List Patient Active Problem List   Diagnosis Date Noted   Fatigue 09/23/2020   Class 3 severe obesity with serious comorbidity and body mass index (BMI) of 50.0 to 59.9 in adult ([place]) 04/28/2020   Knee pain, left 04/23/2020   Diabetes mellitus ([place]) 02/19/2020   Schizoaffective disorder ([place]) 02/19/2020   Vitamin D deficiency 02/19/2020   Low serum vitamin B12 02/19/2020   Low back pain 02/19/2020   Knee pain, right 02/19/2020   BIPOLAR AFFECTIVE DISORDER 03/22/2009   GERD 03/22/2009   PHYSICAL THERAPY DISCHARGE SUMMARY  Visits from Start of Care: 16  Current functional level related to goals / functional outcomes: See goals above   Remaining deficits: See impression above   Education / Equipment: See education above    Patient agrees to discharge. Patient goals were partially met. Patient is being discharged due to maximized rehab potential.  Gwendolyn Grant, PT, DPT, ATC 12/16/20 2:46 PM  [facility] [address] [place], [place], [address] Phone: [phone]   Fax:  [phone]  Name: Brenda Hernandez MRN:  [id number] Date of Birth: 11-06-1975

## 2020-12-19 DIAGNOSIS — M84352D Stress fracture, left femur, subsequent encounter for fracture with routine healing: Secondary | ICD-10-CM | POA: Diagnosis not present

## 2020-12-19 DIAGNOSIS — M170 Bilateral primary osteoarthritis of knee: Secondary | ICD-10-CM | POA: Diagnosis not present

## 2020-12-19 DIAGNOSIS — M25562 Pain in left knee: Secondary | ICD-10-CM | POA: Diagnosis not present

## 2020-12-23 ENCOUNTER — Other Ambulatory Visit: Payer: Self-pay | Admitting: Internal Medicine

## 2020-12-23 DIAGNOSIS — E559 Vitamin D deficiency, unspecified: Secondary | ICD-10-CM | POA: Diagnosis not present

## 2020-12-23 DIAGNOSIS — F429 Obsessive-compulsive disorder, unspecified: Secondary | ICD-10-CM | POA: Diagnosis not present

## 2020-12-23 DIAGNOSIS — F259 Schizoaffective disorder, unspecified: Secondary | ICD-10-CM | POA: Diagnosis not present

## 2020-12-23 DIAGNOSIS — Z79899 Other long term (current) drug therapy: Secondary | ICD-10-CM | POA: Diagnosis not present

## 2020-12-23 DIAGNOSIS — G2401 Drug induced subacute dyskinesia: Secondary | ICD-10-CM | POA: Diagnosis not present

## 2020-12-23 DIAGNOSIS — E538 Deficiency of other specified B group vitamins: Secondary | ICD-10-CM | POA: Diagnosis not present

## 2020-12-23 DIAGNOSIS — R7309 Other abnormal glucose: Secondary | ICD-10-CM | POA: Diagnosis not present

## 2020-12-24 ENCOUNTER — Encounter: Payer: Self-pay | Admitting: Internal Medicine

## 2020-12-24 ENCOUNTER — Other Ambulatory Visit: Payer: Self-pay

## 2020-12-24 ENCOUNTER — Ambulatory Visit (INDEPENDENT_AMBULATORY_CARE_PROVIDER_SITE_OTHER): Payer: Medicare HMO | Admitting: Internal Medicine

## 2020-12-24 VITALS — BP 128/86 | HR 96 | Temp 98.2°F | Ht 63.0 in | Wt 278.6 lb

## 2020-12-24 DIAGNOSIS — E559 Vitamin D deficiency, unspecified: Secondary | ICD-10-CM | POA: Diagnosis not present

## 2020-12-24 DIAGNOSIS — Z6841 Body Mass Index (BMI) 40.0 and over, adult: Secondary | ICD-10-CM

## 2020-12-24 DIAGNOSIS — Z23 Encounter for immunization: Secondary | ICD-10-CM

## 2020-12-24 DIAGNOSIS — E538 Deficiency of other specified B group vitamins: Secondary | ICD-10-CM

## 2020-12-24 DIAGNOSIS — E661 Drug-induced obesity: Secondary | ICD-10-CM

## 2020-12-24 DIAGNOSIS — R7303 Prediabetes: Secondary | ICD-10-CM

## 2020-12-24 NOTE — Progress Notes (Signed)
Subjective:  Patient ID: Brenda Hernandez, female    DOB: 1975-04-26  Age: 45 y.o. MRN: [id number]  CC: Follow-up (3 month f/u)   HPI Brenda Hernandez presents for knee pain Needs TKR on the left when BMI is <36 per Dr [name] C/o wt gain, prediabetes, B12 def Brenda Hernandez is here with her dad  Outpatient Medications Prior to Visit  Medication Sig Dispense Refill   clomiPRAMINE (ANAFRANIL) 75 MG capsule Take 75 mg by mouth 2 (two) times daily.     cloZAPine (CLOZARIL) 100 MG tablet Take 500 mg by mouth daily.     ibuprofen (ADVIL) 400 MG tablet Take 1 tablet (400 mg total) by mouth every 8 (eight) hours as needed for moderate pain. 90 tablet 2   levonorgestrel-ethinyl estradiol (ALESSE) 0.1-20 MG-MCG tablet Sronyx 0.1 mg-20 mcg tablet  TAKE 1 TABLET BY MOUTH EVERY DAY     metFORMIN (GLUCOPHAGE) 1000 MG tablet Take 1,000 mg by mouth 2 (two) times daily with a meal.     valproic acid (DEPAKENE) 250 MG/5ML solution Take 250 mg by mouth at bedtime.     B Complex-Folic Acid (B COMPLEX PLUS) TABS Take 1 tablet by mouth daily. 100 tablet 5   cholecalciferol (VITAMIN D3) 25 MCG (1000 UNIT) tablet TAKE 2 TABLETS (2,000 UNITS TOTAL) BY MOUTH DAILY. (Patient not taking: Reported on 12/24/2020) 100 tablet 5   No facility-administered medications prior to visit.    ROS: Review of Systems  Constitutional:  Positive for unexpected weight change. Negative for activity change, appetite change, chills and fatigue.  HENT:  Negative for congestion, mouth sores and sinus pressure.   Eyes:  Negative for visual disturbance.  Respiratory:  Negative for cough and chest tightness.   Gastrointestinal:  Negative for abdominal pain and nausea.  Genitourinary:  Negative for difficulty urinating, frequency and vaginal pain.  Musculoskeletal:  Positive for arthralgias and gait problem. Negative for back pain.  Skin:  Negative for pallor and rash.  Neurological:  Negative for dizziness, tremors, weakness, numbness and  headaches.  Psychiatric/Behavioral:  Negative for confusion, sleep disturbance and suicidal ideas.    Objective:  BP 128/86 (BP Location: Left Arm)   Pulse 96   Temp 98.2 F (36.8 C) (Oral)   Ht 5\' 3"  (1.6 m)   Wt 278 lb 9.6 oz (126.4 kg)   SpO2 97%   BMI 49.35 kg/m   BP Readings from Last 3 Encounters:  01/02/21 127/88  12/24/20 128/86  12/05/20 125/88    Wt Readings from Last 3 Encounters:  01/02/21 276 lb (125.2 kg)  12/24/20 278 lb 9.6 oz (126.4 kg)  12/05/20 267 lb (121.1 kg)    Physical Exam Constitutional:      General: She is not in acute distress.    Appearance: She is well-developed. She is obese.  HENT:     Head: Normocephalic.     Right Ear: External ear normal.     Left Ear: External ear normal.     Nose: Nose normal.  Eyes:     General:        Right eye: No discharge.        Left eye: No discharge.     Conjunctiva/sclera: Conjunctivae normal.     Pupils: Pupils are equal, round, and reactive to light.  Neck:     Thyroid: No thyromegaly.     Vascular: No JVD.     Trachea: No tracheal deviation.  Cardiovascular:     Rate and Rhythm: Normal rate  and regular rhythm.     Heart sounds: Normal heart sounds.  Pulmonary:     Effort: No respiratory distress.     Breath sounds: No stridor. No wheezing.  Abdominal:     General: Bowel sounds are normal. There is no distension.     Palpations: Abdomen is soft. There is no mass.     Tenderness: There is no abdominal tenderness. There is no guarding or rebound.  Musculoskeletal:        General: Tenderness present.     Cervical back: Normal range of motion and neck supple. No rigidity.  Lymphadenopathy:     Cervical: No cervical adenopathy.  Skin:    Findings: No erythema or rash.  Neurological:     Cranial Nerves: No cranial nerve deficit.     Motor: No abnormal muscle tone.     Coordination: Coordination normal.     Deep Tendon Reflexes: Reflexes normal.  Psychiatric:        Behavior: Behavior  normal.        Thought Content: Thought content normal.        Judgment: Judgment normal.    Lab Results  Component Value Date   WBC 8.4 12/28/2020   HGB 11.1 (L) 12/28/2020   HCT 32.9 (L) 12/28/2020   PLT 315 12/28/2020   GLUCOSE 92 12/28/2020   CHOL 167 12/28/2020   TRIG 65 12/28/2020   HDL 71 12/28/2020   LDLCALC 83 12/28/2020   ALT 12 12/28/2020   AST 15 12/28/2020   NA 139 12/28/2020   K 3.9 12/28/2020   CL 105 12/28/2020   CREATININE 0.82 12/28/2020   BUN 13 12/28/2020   CO2 24 12/28/2020   TSH 2.379 12/28/2020   HGBA1C 5.5 12/28/2020    No results found.  Assessment & Plan:   Problem List Items Addressed This Visit     Class 3 severe obesity with serious comorbidity and body mass index (BMI) of 50.0 to 59.9 in adult [facility])    F/u w./Dr Leafy Ro      Low serum vitamin B12    Cont on a super B complex      Pre-diabetes    On Metformin F/u w/Dr Leafy Ro      Vitamin D deficiency    Continue with vitamin D      Other Visit Diagnoses     Needs flu shot    -  Primary         Follow-up: Return in about 4 months (around 04/26/2021) for a follow-up visit.  Walker Kehr, MD

## 2020-12-24 NOTE — Assessment & Plan Note (Signed)
On Metformin F/u w/Dr Leafy Ro

## 2020-12-24 NOTE — Assessment & Plan Note (Signed)
F/u w./Dr Leafy Ro

## 2020-12-24 NOTE — Assessment & Plan Note (Signed)
Cont on a super B complex

## 2020-12-28 ENCOUNTER — Other Ambulatory Visit (HOSPITAL_COMMUNITY)
Admission: AD | Admit: 2020-12-28 | Discharge: 2020-12-28 | Disposition: A | Discharge status: Home / Self Care | Payer: Medicare HMO | Attending: Internal Medicine | Admitting: Internal Medicine

## 2020-12-28 DIAGNOSIS — E538 Deficiency of other specified B group vitamins: Secondary | ICD-10-CM | POA: Diagnosis not present

## 2020-12-28 DIAGNOSIS — F259 Schizoaffective disorder, unspecified: Secondary | ICD-10-CM | POA: Diagnosis not present

## 2020-12-28 DIAGNOSIS — R7309 Other abnormal glucose: Secondary | ICD-10-CM | POA: Diagnosis not present

## 2020-12-28 DIAGNOSIS — E559 Vitamin D deficiency, unspecified: Secondary | ICD-10-CM | POA: Insufficient documentation

## 2020-12-28 DIAGNOSIS — Z79899 Other long term (current) drug therapy: Secondary | ICD-10-CM | POA: Insufficient documentation

## 2020-12-28 LAB — COMPREHENSIVE METABOLIC PANEL
ALT: 12 U/L (ref 0–44)
AST: 15 U/L (ref 15–41)
Albumin: 2.6 g/dL — ABNORMAL LOW (ref 3.5–5.0)
Alkaline Phosphatase: 55 U/L (ref 38–126)
Anion gap: 10 (ref 5–15)
BUN: 13 mg/dL (ref 6–20)
CO2: 24 mmol/L (ref 22–32)
Calcium: 8.1 mg/dL — ABNORMAL LOW (ref 8.9–10.3)
Chloride: 105 mmol/L (ref 98–111)
Creatinine, Ser: 0.82 mg/dL (ref 0.44–1.00)
GFR, Estimated: >60 mL/min (ref >60)
Glucose, Bld: 92 mg/dL (ref 70–99)
Potassium: 3.9 mmol/L (ref 3.5–5.1)
Sodium: 139 mmol/L (ref 135–145)
Total Bilirubin: 0.3 mg/dL (ref 0.3–1.2)
Total Protein: 6.2 g/dL — ABNORMAL LOW (ref 6.5–8.1)

## 2020-12-28 LAB — HEMOGLOBIN A1C
Hgb A1c MFr Bld: 5.5 % (ref 4.8–5.6)
Mean Plasma Glucose: 111.15 mg/dL

## 2020-12-28 LAB — CBC WITH DIFFERENTIAL/PLATELET
Abs Immature Granulocytes: 0.02 10*3/uL (ref 0.00–0.07)
Basophils Absolute: 0 10*3/uL (ref 0.0–0.1)
Basophils Relative: 0 %
Eosinophils Absolute: 0 10*3/uL (ref 0.0–0.5)
Eosinophils Relative: 0 %
HCT: 32.9 % — ABNORMAL LOW (ref 36.0–46.0)
Hemoglobin: 11.1 g/dL — ABNORMAL LOW (ref 12.0–15.0)
Immature Granulocytes: 0 %
Lymphocytes Relative: 39 %
Lymphs Abs: 3.3 10*3/uL (ref 0.7–4.0)
MCH: 29.5 pg (ref 26.0–34.0)
MCHC: 33.7 g/dL (ref 30.0–36.0)
MCV: 87.5 fL (ref 80.0–100.0)
Monocytes Absolute: 0.6 10*3/uL (ref 0.1–1.0)
Monocytes Relative: 8 %
Neutro Abs: 4.4 10*3/uL (ref 1.7–7.7)
Neutrophils Relative %: 53 %
Platelets: 315 10*3/uL (ref 150–400)
RBC: 3.76 MIL/uL — ABNORMAL LOW (ref 3.87–5.11)
RDW: 15.7 % — ABNORMAL HIGH (ref 11.5–15.5)
WBC: 8.4 10*3/uL (ref 4.0–10.5)
nRBC: 0 % (ref 0.0–0.2)

## 2020-12-28 LAB — LIPID PANEL
Cholesterol: 167 mg/dL (ref 0–200)
HDL: 71 mg/dL (ref >40)
LDL Cholesterol: 83 mg/dL (ref 0–99)
Total CHOL/HDL Ratio: 2.4 RATIO
Triglycerides: 65 mg/dL (ref <150)
VLDL: 13 mg/dL (ref 0–40)

## 2020-12-28 LAB — VALPROIC ACID LEVEL: Valproic Acid Lvl: 34 ug/mL — ABNORMAL LOW (ref 50.0–100.0)

## 2020-12-28 LAB — VITAMIN B12: Vitamin B-12: 210 pg/mL (ref 180–914)

## 2020-12-28 LAB — TSH: TSH: 2.379 u[IU]/mL (ref 0.350–4.500)

## 2020-12-28 LAB — VITAMIN D 25 HYDROXY (VIT D DEFICIENCY, FRACTURES): Vit D, 25-Hydroxy: 52.44 ng/mL (ref 30–100)

## 2020-12-28 LAB — FOLATE: Folate: 31.2 ng/mL (ref >5.9)

## 2020-12-29 ENCOUNTER — Other Ambulatory Visit: Payer: Self-pay | Admitting: Internal Medicine

## 2020-12-29 MED ORDER — B COMPLEX PLUS PO TABS: 1.0000 | ORAL_TABLET | Freq: Every day | ORAL | 5 refills | Status: DC

## 2021-01-02 ENCOUNTER — Encounter (INDEPENDENT_AMBULATORY_CARE_PROVIDER_SITE_OTHER): Payer: Self-pay | Admitting: Family Medicine

## 2021-01-02 ENCOUNTER — Other Ambulatory Visit: Payer: Self-pay

## 2021-01-02 ENCOUNTER — Ambulatory Visit (INDEPENDENT_AMBULATORY_CARE_PROVIDER_SITE_OTHER): Payer: Medicare HMO | Admitting: Family Medicine

## 2021-01-02 VITALS — BP 127/88 | HR 98 | Temp 98.1°F | Ht 63.0 in | Wt 276.0 lb

## 2021-01-02 DIAGNOSIS — R7303 Prediabetes: Secondary | ICD-10-CM | POA: Diagnosis not present

## 2021-01-02 DIAGNOSIS — Z6841 Body Mass Index (BMI) 40.0 and over, adult: Secondary | ICD-10-CM

## 2021-01-02 DIAGNOSIS — F3289 Other specified depressive episodes: Secondary | ICD-10-CM | POA: Diagnosis not present

## 2021-01-02 MED ORDER — TOPIRAMATE 25 MG PO TABS
25.0000 mg | ORAL_TABLET | Freq: Every day | ORAL | 0 refills | Status: DC
Start: 2021-01-02 — End: 2021-08-06

## 2021-01-02 NOTE — Progress Notes (Signed)
Chief Complaint:   OBESITY Brenda Hernandez is here to discuss her progress with her obesity treatment plan along with follow-up of her obesity related diagnoses. Brenda Hernandez is on keeping a food journal and adhering to recommended goals of 1500 calories and 90+ grams of protein daily and states she is following her eating plan approximately 45% of the time. Brenda Hernandez states she is doing 0 minutes 0 times per week.  Today's visit was #: 15 Starting weight: 287 lbs Starting date: 03/26/2020 Today's weight: 276 lbs Today's date: 01/02/2021 Total lbs lost to date: 11 Total lbs lost since last in-office visit: 0  Interim History: Brenda Hernandez is struggling with follow her plan. She is skipping meals and doing more night time snacking. She has gained a significant amount of weight in the last month.  Subjective:   1. Pre-diabetes Brenda Hernandez is struggling weight loss, and she is on metformin. Her primary care physician, Dr. Alain Marion asked her to ask about Mounjaro.  2. Other depression with emotional eating Brenda Hernandez notes increased cravings especially in the PM. She has no history of kidney stones.  Assessment/Plan:   1. Pre-diabetes Brenda Hernandez was advised that her insurance will not cover Mounjaro for pre-diabetes and Medicare/Medicaid would not accept the copy coupon. She will continue metformin and will work on follow her plan closely. She will continue to work on weight loss, exercise, and decreasing simple carbohydrates to help decrease the risk of diabetes.   2. Other depression with emotional eating Behavior modification techniques were discussed today to help Brenda Hernandez deal with her emotional/non-hunger eating behaviors. Brenda Hernandez agreed to start Topamax 25 mg qhs #30 with no refills. She was advised to increase her water intake and take her birth control pill faithfully as this medicine can cause birth defects. Orders and follow up as documented in patient record.   - topiramate (TOPAMAX) 25 MG tablet;  Take 1 tablet (25 mg total) by mouth at bedtime.  Dispense: 30 tablet; Refill: 0  3. Obesity: Current BMI 49.0 Brenda Hernandez is currently in the action stage of change. As such, her goal is to continue with weight loss efforts. She has agreed to the Category 3 Plan or keeping a food journal and adhering to recommended goals of 1500 calories and 90+ grams of protein daily.   Behavioral modification strategies: no skipping meals, meal planning and cooking strategies, and planning for success.  Brenda Hernandez has agreed to follow-up with our clinic in 4 weeks. She was informed of the importance of frequent follow-up visits to maximize her success with intensive lifestyle modifications for her multiple health conditions.   Objective:   Blood pressure 127/88, pulse 98, temperature 98.1 F (36.7 C), height 5\' 3"  (1.6 m), weight 276 lb (125.2 kg), SpO2 97 %. Body mass index is 48.89 kg/m.  General: Cooperative, alert, well developed, in no acute distress. HEENT: Conjunctivae and lids unremarkable. Cardiovascular: Regular rhythm.  Lungs: Normal work of breathing. Neurologic: No focal deficits.   Lab Results  Component Value Date   CREATININE 0.82 12/28/2020   BUN 13 12/28/2020   NA 139 12/28/2020   K 3.9 12/28/2020   CL 105 12/28/2020   CO2 24 12/28/2020   Lab Results  Component Value Date   ALT 12 12/28/2020   AST 15 12/28/2020   ALKPHOS 55 12/28/2020   BILITOT 0.3 12/28/2020   Lab Results  Component Value Date   HGBA1C 5.5 12/28/2020   HGBA1C 5.5 12/05/2020   HGBA1C 5.7 (H) 08/03/2020   HGBA1C 5.6 07/18/2020  HGBA1C 5.8 (H) 03/26/2020   Lab Results  Component Value Date   INSULIN 6.0 12/05/2020   INSULIN 10.8 07/18/2020   INSULIN 24.7 03/26/2020   Lab Results  Component Value Date   TSH 2.379 12/28/2020   Lab Results  Component Value Date   CHOL 167 12/28/2020   HDL 71 12/28/2020   LDLCALC 83 12/28/2020   TRIG 65 12/28/2020   CHOLHDL 2.4 12/28/2020   Lab Results   Component Value Date   VD25OH 52.44 12/28/2020   VD25OH 46.8 12/05/2020   VD25OH 91.02 08/03/2020   Lab Results  Component Value Date   WBC 8.4 12/28/2020   HGB 11.1 (L) 12/28/2020   HCT 32.9 (L) 12/28/2020   MCV 87.5 12/28/2020   PLT 315 12/28/2020   No results found for: IRON, TIBC, FERRITIN  Obesity Behavioral Intervention:   Approximately 15 minutes were spent on the discussion below.  ASK: We discussed the diagnosis of obesity with Brenda Hernandez today and Brenda Hernandez agreed to give [place] permission to discuss obesity behavioral modification therapy today.  ASSESS: Brenda Hernandez has the diagnosis of obesity and her BMI today is 49.0. Brenda Hernandez is in the action stage of change.   ADVISE: Brenda Hernandez was educated on the multiple health risks of obesity as well as the benefit of weight loss to improve her health. She was advised of the need for long term treatment and the importance of lifestyle modifications to improve her current health and to decrease her risk of future health problems.  AGREE: Multiple dietary modification options and treatment options were discussed and Brenda Hernandez agreed to follow the recommendations documented in the above note.  ARRANGE: Brenda Hernandez was educated on the importance of frequent visits to treat obesity as outlined per CMS and USPSTF guidelines and agreed to schedule her next follow up appointment today.  Attestation Statements:   Reviewed by clinician on day of visit: allergies, medications, problem list, medical history, surgical history, family history, social history, and previous encounter notes.   I, Trixie Dredge, am acting as transcriptionist for Dennard Nip, MD.  I have reviewed the above documentation for accuracy and completeness, and I agree with the above. -

## 2021-01-02 NOTE — Addendum Note (Signed)
Addended by: Norton Blizzard R on: 18/86/7737 12:37 PM   Modules accepted: Orders

## 2021-01-02 NOTE — Addendum Note (Signed)
Addended by: Dennard Nip D on: 01/02/2021 01:50 PM   Modules accepted: Orders

## 2021-01-05 NOTE — Assessment & Plan Note (Signed)
Continue with vitamin D 

## 2021-01-10 ENCOUNTER — Other Ambulatory Visit (INDEPENDENT_AMBULATORY_CARE_PROVIDER_SITE_OTHER): Payer: Self-pay | Admitting: Family Medicine

## 2021-01-10 DIAGNOSIS — F3289 Other specified depressive episodes: Secondary | ICD-10-CM

## 2021-01-11 DIAGNOSIS — M79605 Pain in left leg: Secondary | ICD-10-CM | POA: Diagnosis not present

## 2021-01-11 DIAGNOSIS — M79604 Pain in right leg: Secondary | ICD-10-CM | POA: Diagnosis not present

## 2021-01-30 ENCOUNTER — Other Ambulatory Visit: Payer: Self-pay

## 2021-01-30 ENCOUNTER — Encounter (INDEPENDENT_AMBULATORY_CARE_PROVIDER_SITE_OTHER): Payer: Self-pay | Admitting: Family Medicine

## 2021-01-30 ENCOUNTER — Ambulatory Visit (INDEPENDENT_AMBULATORY_CARE_PROVIDER_SITE_OTHER): Payer: Medicare HMO | Admitting: Family Medicine

## 2021-01-30 VITALS — BP 137/91 | HR 89 | Temp 97.4°F | Ht 63.0 in | Wt 274.0 lb

## 2021-01-30 DIAGNOSIS — Z6841 Body Mass Index (BMI) 40.0 and over, adult: Secondary | ICD-10-CM | POA: Diagnosis not present

## 2021-01-30 DIAGNOSIS — R030 Elevated blood-pressure reading, without diagnosis of hypertension: Secondary | ICD-10-CM | POA: Diagnosis not present

## 2021-01-30 DIAGNOSIS — R7303 Prediabetes: Secondary | ICD-10-CM | POA: Diagnosis not present

## 2021-01-30 DIAGNOSIS — F3289 Other specified depressive episodes: Secondary | ICD-10-CM | POA: Diagnosis not present

## 2021-02-01 ENCOUNTER — Other Ambulatory Visit (HOSPITAL_COMMUNITY)
Admission: RE | Admit: 2021-02-01 | Discharge: 2021-02-01 | Disposition: A | Discharge status: Home / Self Care | Payer: Medicare HMO | Attending: Psychiatry | Admitting: Psychiatry

## 2021-02-01 DIAGNOSIS — F259 Schizoaffective disorder, unspecified: Secondary | ICD-10-CM | POA: Diagnosis not present

## 2021-02-01 DIAGNOSIS — Z79899 Other long term (current) drug therapy: Secondary | ICD-10-CM | POA: Diagnosis not present

## 2021-02-01 LAB — CBC WITH DIFFERENTIAL/PLATELET
Abs Immature Granulocytes: 0.02 10*3/uL (ref 0.00–0.07)
Basophils Absolute: 0 10*3/uL (ref 0.0–0.1)
Basophils Relative: 0 %
Eosinophils Absolute: 0 10*3/uL (ref 0.0–0.5)
Eosinophils Relative: 0 %
HCT: 35 % — ABNORMAL LOW (ref 36.0–46.0)
Hemoglobin: 11.9 g/dL — ABNORMAL LOW (ref 12.0–15.0)
Immature Granulocytes: 0 %
Lymphocytes Relative: 46 %
Lymphs Abs: 4.3 10*3/uL — ABNORMAL HIGH (ref 0.7–4.0)
MCH: 29.2 pg (ref 26.0–34.0)
MCHC: 34 g/dL (ref 30.0–36.0)
MCV: 86 fL (ref 80.0–100.0)
Monocytes Absolute: 0.7 10*3/uL (ref 0.1–1.0)
Monocytes Relative: 8 %
Neutro Abs: 4.3 10*3/uL (ref 1.7–7.7)
Neutrophils Relative %: 46 %
Platelets: 286 10*3/uL (ref 150–400)
RBC: 4.07 MIL/uL (ref 3.87–5.11)
RDW: 16.3 % — ABNORMAL HIGH (ref 11.5–15.5)
WBC: 9.4 10*3/uL (ref 4.0–10.5)
nRBC: 0 % (ref 0.0–0.2)

## 2021-02-03 NOTE — Progress Notes (Signed)
Chief Complaint:   OBESITY Brenda Hernandez is here to discuss her progress with her obesity treatment plan along with follow-up of her obesity related diagnoses. Brenda Hernandez is on the Category 3 Plan or keeping a food journal and adhering to recommended goals of 1500 calories and 90+ grams of protein daily and states she is following her eating plan approximately 45-50% of the time. Brenda Hernandez states she is doing 0 minutes 0 times per week.  Today's visit was #: 31 Starting weight: 287 lbs Starting date: 03/26/2020 Today's weight: 274 lbs Today's date: 01/30/2021 Total lbs lost to date: 13 Total lbs lost since last in-office visit: 2  Interim History: Brenda Hernandez continues to work on diet and exercise. She is being mindful of her food choices.  Subjective:   1. Pre-diabetes Brenda Hernandez is stable on metformin, and she is doing well to remember to take both doses.  2. Elevated blood-pressure reading without diagnosis of hypertension Brenda Hernandez's blood pressure is elevated today, which is unusual. She denies chest pain or headache.  3. Other depression with emotional eating Brenda Hernandez hasn't started her Topamax yet. She wants to wait until her Psychiatrist agrees, which is fine.  Assessment/Plan:   1. Pre-diabetes Spring will continue with metformin and her diet, and she will continue to follow up as directed.  2. Elevated blood-pressure reading without diagnosis of hypertension Braeleigh will continue with her diet and exercise, and we will recheck her blood pressure in 1 month.  3. Other depression with emotional eating Brenda Hernandez will continue with her diet and exercise, and will continue to monitor. Behavior modification techniques were discussed today to help Brenda Hernandez deal with her emotional/non-hunger eating behaviors. Orders and follow up as documented in patient record.   4. Obesity with current BMI 48.6 Brenda Hernandez is currently in the action stage of change. As such, her goal is to continue with weight  loss efforts. She has agreed to keeping a food journal and adhering to recommended goals of 1500 calories and 90+ grams of protein daily.   Behavioral modification strategies: meal planning and cooking strategies, holiday eating strategies , and keeping a strict food journal.  Brenda Hernandez has agreed to follow-up with our clinic in 4 weeks. She was informed of the importance of frequent follow-up visits to maximize her success with intensive lifestyle modifications for her multiple health conditions.   Objective:   Blood pressure (!) 137/91, pulse 89, temperature (!) 97.4 F (36.3 C), height 5\' 3"  (1.6 m), weight 274 lb (124.3 kg), SpO2 100 %. Body mass index is 48.54 kg/m.  General: Cooperative, alert, well developed, in no acute distress. HEENT: Conjunctivae and lids unremarkable. Cardiovascular: Regular rhythm.  Lungs: Normal work of breathing. Neurologic: No focal deficits.   Lab Results  Component Value Date   CREATININE 0.82 12/28/2020   BUN 13 12/28/2020   NA 139 12/28/2020   K 3.9 12/28/2020   CL 105 12/28/2020   CO2 24 12/28/2020   Lab Results  Component Value Date   ALT 12 12/28/2020   AST 15 12/28/2020   ALKPHOS 55 12/28/2020   BILITOT 0.3 12/28/2020   Lab Results  Component Value Date   HGBA1C 5.5 12/28/2020   HGBA1C 5.5 12/05/2020   HGBA1C 5.7 (H) 08/03/2020   HGBA1C 5.6 07/18/2020   HGBA1C 5.8 (H) 03/26/2020   Lab Results  Component Value Date   INSULIN 6.0 12/05/2020   INSULIN 10.8 07/18/2020   INSULIN 24.7 03/26/2020   Lab Results  Component Value Date   TSH  2.379 12/28/2020   Lab Results  Component Value Date   CHOL 167 12/28/2020   HDL 71 12/28/2020   LDLCALC 83 12/28/2020   TRIG 65 12/28/2020   CHOLHDL 2.4 12/28/2020   Lab Results  Component Value Date   VD25OH 52.44 12/28/2020   VD25OH 46.8 12/05/2020   VD25OH 91.02 08/03/2020   Lab Results  Component Value Date   WBC 9.4 02/01/2021   HGB 11.9 (L) 02/01/2021   HCT 35.0 (L)  02/01/2021   MCV 86.0 02/01/2021   PLT 286 02/01/2021   No results found for: IRON, TIBC, FERRITIN  Obesity Behavioral Intervention:   Approximately 15 minutes were spent on the discussion below.  ASK: We discussed the diagnosis of obesity with Brenda Hernandez today and Brenda Hernandez agreed to give Brenda Hernandez permission to discuss obesity behavioral modification therapy today.  ASSESS: Brenda Hernandez has the diagnosis of obesity and her BMI today is 48.6. Brenda Hernandez is in the action stage of change.   ADVISE: Brenda Hernandez was educated on the multiple health risks of obesity as well as the benefit of weight loss to improve her health. She was advised of the need for long term treatment and the importance of lifestyle modifications to improve her current health and to decrease her risk of future health problems.  AGREE: Multiple dietary modification options and treatment options were discussed and Brenda Hernandez agreed to follow the recommendations documented in the above note.  ARRANGE: Brenda Hernandez was educated on the importance of frequent visits to treat obesity as outlined per CMS and USPSTF guidelines and agreed to schedule her next follow up appointment today.  Attestation Statements:   Reviewed by clinician on day of visit: allergies, medications, problem list, medical history, surgical history, family history, social history, and previous encounter notes.   I, Trixie Dredge, am acting as transcriptionist for Dennard Nip, MD.  I have reviewed the above documentation for accuracy and completeness, and I agree with the above. -  Dennard Nip, MD

## 2021-02-12 DIAGNOSIS — R87610 Atypical squamous cells of undetermined significance on cytologic smear of cervix (ASC-US): Secondary | ICD-10-CM | POA: Diagnosis not present

## 2021-02-12 DIAGNOSIS — R87810 Cervical high risk human papillomavirus (HPV) DNA test positive: Secondary | ICD-10-CM | POA: Diagnosis not present

## 2021-02-12 DIAGNOSIS — Z6841 Body Mass Index (BMI) 40.0 and over, adult: Secondary | ICD-10-CM | POA: Diagnosis not present

## 2021-02-12 DIAGNOSIS — Z3040 Encounter for surveillance of contraceptives, unspecified: Secondary | ICD-10-CM | POA: Diagnosis not present

## 2021-02-12 DIAGNOSIS — Z01411 Encounter for gynecological examination (general) (routine) with abnormal findings: Secondary | ICD-10-CM | POA: Diagnosis not present

## 2021-02-12 DIAGNOSIS — L0292 Furuncle, unspecified: Secondary | ICD-10-CM | POA: Diagnosis not present

## 2021-02-12 DIAGNOSIS — R87612 Low grade squamous intraepithelial lesion on cytologic smear of cervix (LGSIL): Secondary | ICD-10-CM | POA: Diagnosis not present

## 2021-02-12 DIAGNOSIS — Z1231 Encounter for screening mammogram for malignant neoplasm of breast: Secondary | ICD-10-CM | POA: Diagnosis not present

## 2021-02-12 DIAGNOSIS — N870 Mild cervical dysplasia: Secondary | ICD-10-CM | POA: Diagnosis not present

## 2021-02-12 LAB — HM PAP SMEAR: HM Pap smear: NORMAL

## 2021-02-13 DIAGNOSIS — Z20822 Contact with and (suspected) exposure to covid-19: Secondary | ICD-10-CM | POA: Diagnosis not present

## 2021-02-18 DIAGNOSIS — M25562 Pain in left knee: Secondary | ICD-10-CM | POA: Diagnosis not present

## 2021-02-26 ENCOUNTER — Encounter: Payer: Self-pay | Admitting: Internal Medicine

## 2021-02-27 ENCOUNTER — Ambulatory Visit (INDEPENDENT_AMBULATORY_CARE_PROVIDER_SITE_OTHER): Payer: Medicare HMO | Admitting: Family Medicine

## 2021-02-27 ENCOUNTER — Encounter (INDEPENDENT_AMBULATORY_CARE_PROVIDER_SITE_OTHER): Payer: Self-pay | Admitting: Family Medicine

## 2021-02-27 ENCOUNTER — Other Ambulatory Visit: Payer: Self-pay

## 2021-02-27 VITALS — BP 126/83 | HR 94 | Temp 98.3°F | Ht 63.0 in | Wt 280.0 lb

## 2021-02-27 DIAGNOSIS — F3289 Other specified depressive episodes: Secondary | ICD-10-CM | POA: Diagnosis not present

## 2021-02-27 DIAGNOSIS — Z6841 Body Mass Index (BMI) 40.0 and over, adult: Secondary | ICD-10-CM

## 2021-02-27 DIAGNOSIS — R7303 Prediabetes: Secondary | ICD-10-CM | POA: Diagnosis not present

## 2021-02-27 NOTE — Progress Notes (Signed)
Chief Complaint:   OBESITY Brenda Hernandez is here to discuss her progress with her obesity treatment plan along with follow-up of her obesity related diagnoses. Brenda Hernandez is on keeping a food journal and adhering to recommended goals of 1500 calories and 90+ grams of protein daily and states she is following her eating plan approximately 25% of the time. Brenda Hernandez states she is doing strengthening exercises.   Today's visit was #: 12 Starting weight: 287 lbs Starting date: 03/26/2020 Today's weight: 280 lbs Today's date: 02/27/2021 Total lbs lost to date: 7 Total lbs lost since last in-office visit: 0  Interim History: Brenda Hernandez has done some celebration eating over Thanksgiving. She has been mindful of her portion control, but she is not able to journal strictly.  Subjective:   1. Pre-diabetes Brenda Hernandez's A1c is well controlled at 5.5. She continues to work on diet and weight loss, and she is trying to decrease simple carbohydrates.  2. Other depression with emotional eating Brenda Hernandez was started on Topamax last month, but she wanted to get her Psychiatrist's ok first, which is reasonable. Unfortunately she has not gotten a call back from them. She is still struggling with emotional eating behaviors.  Assessment/Plan:   1. Pre-diabetes Brenda Hernandez will continue to work on weight loss, exercise, and decreasing simple carbohydrates to help decrease the risk of diabetes. We will recheck labs in 1-2 months.  2. Other depression with emotional eating Brenda Hernandez is to contact her Psychiatrist and if they approve, she is to start on Topamax. She is to continue to be mindful of this behavior.  3. Obesity BMI today is 73 Brenda Hernandez is currently in the action stage of change. As such, her goal is to maintain weight her weight over Christmas and get back on track afterwards. She has agreed to the Category 3 Plan or keeping a food journal and adhering to recommended goals of 1500 calories and 90+ grams of protein  daily.   Exercise goals: As is.  Behavioral modification strategies: emotional eating strategies and holiday eating strategies .  Brenda Hernandez has agreed to follow-up with our clinic in 4 weeks. She was informed of the importance of frequent follow-up visits to maximize her success with intensive lifestyle modifications for her multiple health conditions.   Objective:   Blood pressure 126/83, pulse 94, temperature 98.3 F (36.8 C), height 5\' 3"  (1.6 m), weight 280 lb (127 kg), SpO2 100 %. Body mass index is 49.6 kg/m.  General: Cooperative, alert, well developed, in no acute distress. HEENT: Conjunctivae and lids unremarkable. Cardiovascular: Regular rhythm.  Lungs: Normal work of breathing. Neurologic: No focal deficits.   Lab Results  Component Value Date   CREATININE 0.82 12/28/2020   BUN 13 12/28/2020   NA 139 12/28/2020   K 3.9 12/28/2020   CL 105 12/28/2020   CO2 24 12/28/2020   Lab Results  Component Value Date   ALT 12 12/28/2020   AST 15 12/28/2020   ALKPHOS 55 12/28/2020   BILITOT 0.3 12/28/2020   Lab Results  Component Value Date   HGBA1C 5.5 12/28/2020   HGBA1C 5.5 12/05/2020   HGBA1C 5.7 (H) 08/03/2020   HGBA1C 5.6 07/18/2020   HGBA1C 5.8 (H) 03/26/2020   Lab Results  Component Value Date   INSULIN 6.0 12/05/2020   INSULIN 10.8 07/18/2020   INSULIN 24.7 03/26/2020   Lab Results  Component Value Date   TSH 2.379 12/28/2020   Lab Results  Component Value Date   CHOL 167 12/28/2020  HDL 71 12/28/2020   LDLCALC 83 12/28/2020   TRIG 65 12/28/2020   CHOLHDL 2.4 12/28/2020   Lab Results  Component Value Date   VD25OH 52.44 12/28/2020   VD25OH 46.8 12/05/2020   VD25OH 91.02 08/03/2020   Lab Results  Component Value Date   WBC 9.4 02/01/2021   HGB 11.9 (L) 02/01/2021   HCT 35.0 (L) 02/01/2021   MCV 86.0 02/01/2021   PLT 286 02/01/2021   No results found for: IRON, TIBC, FERRITIN  Attestation Statements:   Reviewed by clinician on day of  visit: allergies, medications, problem list, medical history, surgical history, family history, social history, and previous encounter notes.  Time spent on visit including pre-visit chart review and post-visit care and charting was 30 minutes.    I, Trixie Dredge, am acting as transcriptionist for Dennard Nip, MD.  I have reviewed the above documentation for accuracy and completeness, and I agree with the above. -  Dennard Nip, MD

## 2021-02-28 ENCOUNTER — Other Ambulatory Visit (HOSPITAL_COMMUNITY)
Admission: AD | Admit: 2021-02-28 | Discharge: 2021-02-28 | Disposition: A | Discharge status: Home / Self Care | Payer: Medicare HMO | Attending: Psychiatry | Admitting: Psychiatry

## 2021-02-28 DIAGNOSIS — R7989 Other specified abnormal findings of blood chemistry: Secondary | ICD-10-CM | POA: Insufficient documentation

## 2021-02-28 LAB — CBC WITH DIFFERENTIAL/PLATELET
Abs Immature Granulocytes: 0.04 10*3/uL (ref 0.00–0.07)
Basophils Absolute: 0 10*3/uL (ref 0.0–0.1)
Basophils Relative: 0 %
Eosinophils Absolute: 0 10*3/uL (ref 0.0–0.5)
Eosinophils Relative: 0 %
HCT: 35.4 % — ABNORMAL LOW (ref 36.0–46.0)
Hemoglobin: 12.3 g/dL (ref 12.0–15.0)
Immature Granulocytes: 0 %
Lymphocytes Relative: 47 %
Lymphs Abs: 4.8 10*3/uL — ABNORMAL HIGH (ref 0.7–4.0)
MCH: 29.4 pg (ref 26.0–34.0)
MCHC: 34.7 g/dL (ref 30.0–36.0)
MCV: 84.7 fL (ref 80.0–100.0)
Monocytes Absolute: 0.7 10*3/uL (ref 0.1–1.0)
Monocytes Relative: 7 %
Neutro Abs: 4.8 10*3/uL (ref 1.7–7.7)
Neutrophils Relative %: 46 %
Platelets: 335 10*3/uL (ref 150–400)
RBC: 4.18 MIL/uL (ref 3.87–5.11)
RDW: 16.5 % — ABNORMAL HIGH (ref 11.5–15.5)
WBC: 10.3 10*3/uL (ref 4.0–10.5)
nRBC: 0 % (ref 0.0–0.2)

## 2021-03-04 DIAGNOSIS — M25562 Pain in left knee: Secondary | ICD-10-CM | POA: Diagnosis not present

## 2021-03-11 DIAGNOSIS — M1712 Unilateral primary osteoarthritis, left knee: Secondary | ICD-10-CM | POA: Diagnosis not present

## 2021-03-11 DIAGNOSIS — M170 Bilateral primary osteoarthritis of knee: Secondary | ICD-10-CM | POA: Diagnosis not present

## 2021-04-03 ENCOUNTER — Other Ambulatory Visit: Payer: Self-pay

## 2021-04-03 ENCOUNTER — Ambulatory Visit (INDEPENDENT_AMBULATORY_CARE_PROVIDER_SITE_OTHER): Payer: Medicare HMO | Admitting: Family Medicine

## 2021-04-03 ENCOUNTER — Encounter (INDEPENDENT_AMBULATORY_CARE_PROVIDER_SITE_OTHER): Payer: Self-pay | Admitting: Family Medicine

## 2021-04-03 VITALS — BP 118/85 | HR 91 | Temp 97.9°F | Ht 63.0 in | Wt 283.0 lb

## 2021-04-03 DIAGNOSIS — Z6841 Body Mass Index (BMI) 40.0 and over, adult: Secondary | ICD-10-CM

## 2021-04-03 DIAGNOSIS — E669 Obesity, unspecified: Secondary | ICD-10-CM | POA: Diagnosis not present

## 2021-04-03 DIAGNOSIS — R7303 Prediabetes: Secondary | ICD-10-CM | POA: Diagnosis not present

## 2021-04-03 NOTE — Progress Notes (Signed)
Chief Complaint:   OBESITY Brenda Hernandez is here to discuss her progress with her obesity treatment plan along with follow-up of her obesity related diagnoses. Brenda Hernandez is on the Category 3 Plan or keeping a food journal and adhering to recommended goals of 1500 calories and 90+ grams of protein daily and states she is following her eating plan approximately 25% of the time. Brenda Hernandez states she is doing strengthening.  Today's visit was #: 59 Starting weight: 287 lbs Starting date: 03/26/2020 Today's weight: 283 lbs Today's date: 04/03/2021 Total lbs lost to date: 5 Total lbs lost since last in-office visit: 0  Interim History: Brenda Hernandez did some celebration eating over the holidays, but she is ready to get back on track with her weight loss efforts. She is open to looking at other options.  Subjective:   1. Pre-diabetes Brenda Hernandez continues to work on decreasing simple carbohydrates. She is ready to look at other eating plans.  Assessment/Plan:   1. Pre-diabetes Brenda Hernandez will continue metformin, and will continue to work on diet, exercise, and decreasing simple carbohydrates to help decrease the risk of diabetes.   2. Obesity BMI today is 50.1 Brenda Hernandez is currently in the action stage of change. As such, her goal is to continue with weight loss efforts. She has agreed to change to following a lower carbohydrate, vegetable and lean protein rich diet plan.   Exercise goals: As is.  Behavioral modification strategies: increasing water intake and meal planning and cooking strategies.  Brenda Hernandez has agreed to follow-up with our clinic in 2 to 3 weeks, with Abby Potash, PA-C. She was informed of the importance of frequent follow-up visits to maximize her success with intensive lifestyle modifications for her multiple health conditions.   Objective:   Blood pressure 118/85, pulse 91, temperature 97.9 F (36.6 C), height 5\' 3"  (1.6 m), weight 283 lb (128.4 kg), SpO2 99 %. Body mass index is  50.13 kg/m.  General: Cooperative, alert, well developed, in no acute distress. HEENT: Conjunctivae and lids unremarkable. Cardiovascular: Regular rhythm.  Lungs: Normal work of breathing. Neurologic: No focal deficits.   Lab Results  Component Value Date   CREATININE 0.82 12/28/2020   BUN 13 12/28/2020   NA 139 12/28/2020   K 3.9 12/28/2020   CL 105 12/28/2020   CO2 24 12/28/2020   Lab Results  Component Value Date   ALT 12 12/28/2020   AST 15 12/28/2020   ALKPHOS 55 12/28/2020   BILITOT 0.3 12/28/2020   Lab Results  Component Value Date   HGBA1C 5.5 12/28/2020   HGBA1C 5.5 12/05/2020   HGBA1C 5.7 (H) 08/03/2020   HGBA1C 5.6 07/18/2020   HGBA1C 5.8 (H) 03/26/2020   Lab Results  Component Value Date   INSULIN 6.0 12/05/2020   INSULIN 10.8 07/18/2020   INSULIN 24.7 03/26/2020   Lab Results  Component Value Date   TSH 2.379 12/28/2020   Lab Results  Component Value Date   CHOL 167 12/28/2020   HDL 71 12/28/2020   LDLCALC 83 12/28/2020   TRIG 65 12/28/2020   CHOLHDL 2.4 12/28/2020   Lab Results  Component Value Date   VD25OH 52.44 12/28/2020   VD25OH 46.8 12/05/2020   VD25OH 91.02 08/03/2020   Lab Results  Component Value Date   WBC 10.3 02/28/2021   HGB 12.3 02/28/2021   HCT 35.4 (L) 02/28/2021   MCV 84.7 02/28/2021   PLT 335 02/28/2021   No results found for: IRON, TIBC, FERRITIN  Attestation Statements:  Reviewed by clinician on day of visit: allergies, medications, problem list, medical history, surgical history, family history, social history, and previous encounter notes.  Time spent on visit including pre-visit chart review and post-visit care and charting was 30 minutes.    I, Trixie Dredge, am acting as transcriptionist for Dennard Nip, MD.  I have reviewed the above documentation for accuracy and completeness, and I agree with the above. -  Dennard Nip, MD

## 2021-04-04 ENCOUNTER — Other Ambulatory Visit (HOSPITAL_COMMUNITY)
Admission: AD | Admit: 2021-04-04 | Discharge: 2021-04-04 | Disposition: A | Discharge status: Home / Self Care | Payer: Medicare HMO | Attending: Psychiatry | Admitting: Psychiatry

## 2021-04-04 DIAGNOSIS — Z79899 Other long term (current) drug therapy: Secondary | ICD-10-CM | POA: Diagnosis not present

## 2021-04-04 LAB — CBC WITH DIFFERENTIAL/PLATELET
Abs Immature Granulocytes: 0.02 10*3/uL (ref 0.00–0.07)
Basophils Absolute: 0 10*3/uL (ref 0.0–0.1)
Basophils Relative: 0 %
Eosinophils Absolute: 0 10*3/uL (ref 0.0–0.5)
Eosinophils Relative: 0 %
HCT: 37.1 % (ref 36.0–46.0)
Hemoglobin: 12.7 g/dL (ref 12.0–15.0)
Immature Granulocytes: 0 %
Lymphocytes Relative: 50 %
Lymphs Abs: 3.8 10*3/uL (ref 0.7–4.0)
MCH: 29.1 pg (ref 26.0–34.0)
MCHC: 34.2 g/dL (ref 30.0–36.0)
MCV: 84.9 fL (ref 80.0–100.0)
Monocytes Absolute: 0.4 10*3/uL (ref 0.1–1.0)
Monocytes Relative: 5 %
Neutro Abs: 3.5 10*3/uL (ref 1.7–7.7)
Neutrophils Relative %: 45 %
Platelets: 285 10*3/uL (ref 150–400)
RBC: 4.37 MIL/uL (ref 3.87–5.11)
RDW: 17.5 % — ABNORMAL HIGH (ref 11.5–15.5)
WBC: 7.7 10*3/uL (ref 4.0–10.5)
nRBC: 0 % (ref 0.0–0.2)

## 2021-04-17 ENCOUNTER — Encounter (INDEPENDENT_AMBULATORY_CARE_PROVIDER_SITE_OTHER): Payer: Self-pay | Admitting: Physician Assistant

## 2021-04-17 ENCOUNTER — Other Ambulatory Visit: Payer: Self-pay

## 2021-04-17 ENCOUNTER — Ambulatory Visit (INDEPENDENT_AMBULATORY_CARE_PROVIDER_SITE_OTHER): Payer: Medicare HMO | Admitting: Physician Assistant

## 2021-04-17 VITALS — BP 125/89 | HR 96 | Temp 98.1°F | Ht 63.0 in | Wt 287.0 lb

## 2021-04-17 DIAGNOSIS — Z6841 Body Mass Index (BMI) 40.0 and over, adult: Secondary | ICD-10-CM | POA: Diagnosis not present

## 2021-04-17 DIAGNOSIS — E669 Obesity, unspecified: Secondary | ICD-10-CM | POA: Diagnosis not present

## 2021-04-17 DIAGNOSIS — R7303 Prediabetes: Secondary | ICD-10-CM | POA: Diagnosis not present

## 2021-04-17 NOTE — Progress Notes (Signed)
Chief Complaint:   OBESITY Brenda Hernandez is here to discuss her progress with her obesity treatment plan along with follow-up of her obesity related diagnoses. Brenda Hernandez is on following a lower carbohydrate, vegetable and lean protein rich diet plan and states she is following her eating plan approximately 25% of the time. Brenda Hernandez states she is strength training 5-10 minutes 7 times per week.  Today's visit was #: 40 Starting weight: 287 lbs Starting date: 03/26/2020 Today's weight: 287 lbs Today's date: 04/17/2021 Total lbs lost to date: 0 Total lbs lost since last in-office visit: 0  Interim History: Pt did not stick with the low carb plan, as she didn't like it. She is interested in going back to a category plan.  Subjective:   1. Pre-diabetes Pt is on Metformin BID. Her last A1c was 5.5. She denies hypoglycemia or polyphagia.  Assessment/Plan:   1. Pre-diabetes Daryn will continue to work on weight loss, exercise, and decreasing simple carbohydrates to help decrease the risk of diabetes. Continue with meds and plan.  2. Obesity BMI today is 50.85 Damariz is currently in the action stage of change. As such, her goal is to continue with weight loss efforts. She has agreed to the Category 2 Plan with breakfast options.   Exercise goals:  As is  Behavioral modification strategies: increasing lean protein intake, decreasing simple carbohydrates, and meal planning and cooking strategies.  Eeva has agreed to follow-up with our clinic in 2 weeks. She was informed of the importance of frequent follow-up visits to maximize her success with intensive lifestyle modifications for her multiple health conditions.   Objective:   Blood pressure 125/89, pulse 96, temperature 98.1 F (36.7 C), height 5\' 3"  (1.6 m), weight 287 lb (130.2 kg), SpO2 98 %. Body mass index is 50.84 kg/m.  General: Cooperative, alert, well developed, in no acute distress. HEENT: Conjunctivae and lids  unremarkable. Cardiovascular: Regular rhythm.  Lungs: Normal work of breathing. Neurologic: No focal deficits.   Lab Results  Component Value Date   CREATININE 0.82 12/28/2020   BUN 13 12/28/2020   NA 139 12/28/2020   K 3.9 12/28/2020   CL 105 12/28/2020   CO2 24 12/28/2020   Lab Results  Component Value Date   ALT 12 12/28/2020   AST 15 12/28/2020   ALKPHOS 55 12/28/2020   BILITOT 0.3 12/28/2020   Lab Results  Component Value Date   HGBA1C 5.5 12/28/2020   HGBA1C 5.5 12/05/2020   HGBA1C 5.7 (H) 08/03/2020   HGBA1C 5.6 07/18/2020   HGBA1C 5.8 (H) 03/26/2020   Lab Results  Component Value Date   INSULIN 6.0 12/05/2020   INSULIN 10.8 07/18/2020   INSULIN 24.7 03/26/2020   Lab Results  Component Value Date   TSH 2.379 12/28/2020   Lab Results  Component Value Date   CHOL 167 12/28/2020   HDL 71 12/28/2020   LDLCALC 83 12/28/2020   TRIG 65 12/28/2020   CHOLHDL 2.4 12/28/2020   Lab Results  Component Value Date   VD25OH 52.44 12/28/2020   VD25OH 46.8 12/05/2020   VD25OH 91.02 08/03/2020   Lab Results  Component Value Date   WBC 7.7 04/04/2021   HGB 12.7 04/04/2021   HCT 37.1 04/04/2021   MCV 84.9 04/04/2021   PLT 285 04/04/2021   No results found for: IRON, TIBC, FERRITIN  Obesity Behavioral Intervention:   Approximately 15 minutes were spent on the discussion below.  ASK: We discussed the diagnosis of obesity with Cris  today and Aysel agreed to give [place] permission to discuss obesity behavioral modification therapy today.  ASSESS: Meredith has the diagnosis of obesity and her BMI today is 51.0. Mauricia is in the action stage of change.   ADVISE: Teigan was educated on the multiple health risks of obesity as well as the benefit of weight loss to improve her health. She was advised of the need for long term treatment and the importance of lifestyle modifications to improve her current health and to decrease her risk of future health  problems.  AGREE: Multiple dietary modification options and treatment options were discussed and Mailynn agreed to follow the recommendations documented in the above note.  ARRANGE: Loza was educated on the importance of frequent visits to treat obesity as outlined per CMS and USPSTF guidelines and agreed to schedule her next follow up appointment today.  Attestation Statements:   Reviewed by clinician on day of visit: allergies, medications, problem list, medical history, surgical history, family history, social history, and previous encounter notes.  Coral Ceo, CMA, am acting as transcriptionist for [employer], PA-C.  I have reviewed the above documentation for accuracy and completeness, and I agree with the above. Abby Potash, PA-C

## 2021-04-30 ENCOUNTER — Ambulatory Visit (INDEPENDENT_AMBULATORY_CARE_PROVIDER_SITE_OTHER): Payer: Medicare HMO | Admitting: Family Medicine

## 2021-04-30 ENCOUNTER — Encounter (INDEPENDENT_AMBULATORY_CARE_PROVIDER_SITE_OTHER): Payer: Self-pay | Admitting: Family Medicine

## 2021-04-30 ENCOUNTER — Other Ambulatory Visit: Payer: Self-pay

## 2021-04-30 VITALS — BP 123/77 | HR 98 | Temp 97.8°F | Ht 63.0 in | Wt 290.0 lb

## 2021-04-30 DIAGNOSIS — Z6841 Body Mass Index (BMI) 40.0 and over, adult: Secondary | ICD-10-CM

## 2021-04-30 DIAGNOSIS — R7303 Prediabetes: Secondary | ICD-10-CM | POA: Diagnosis not present

## 2021-04-30 DIAGNOSIS — F3289 Other specified depressive episodes: Secondary | ICD-10-CM | POA: Diagnosis not present

## 2021-04-30 DIAGNOSIS — E669 Obesity, unspecified: Secondary | ICD-10-CM

## 2021-04-30 NOTE — Progress Notes (Signed)
Chief Complaint:   OBESITY Margerie is here to discuss her progress with her obesity treatment plan along with follow-up of her obesity related diagnoses. Sherrise is on the Category 2 Plan and states she is following her eating plan approximately 30% of the time. Rosary states she is stretching bands and marching for  5-10 minutes 7 times per week.  Today's visit was #: 20 Starting weight: 287 lbs Starting date: 03/26/2020 Today's weight: 290 lbs Today's date: 04/30/2021 Total lbs lost to date: 0 Total lbs lost since last in-office visit: 0  Interim History: Reygan skips lunch in order to lose weight.  Food recall: Cereal (usually Cheerios) or eggs with mushrooms. Skips lunch. Dinner varies. She drinks juice at dinner.  She has tried the low-carb plan and did not feel that it worked for her.    Subjective:   1. Pre-diabetes Andersyn denies polyphagia. Her last A1C was 5.5. She is on Metformin 1000 mg 2 times daily.   Lab Results  Component Value Date   HGBA1C 5.5 12/28/2020   Lab Results  Component Value Date   INSULIN 6.0 12/05/2020   INSULIN 10.8 07/18/2020   INSULIN 24.7 03/26/2020    2. Other depression with emotional eating Amayia has not started the topiramate because she has not gotten approval from her psychiatrist.  She has a diagnosis of bipolar disorder.  Assessment/Plan:   1. Pre-diabetes Valena will continue taking Metformin.   2. Other depression with emotional eating Kieley will follow up with psychiatry about starting topiramate. We discussed the importance of eating regularly to maintain good metabolism.  We also discussed the importance of getting enough protein.  3. Obesity BMI today is 51.38 Amos is currently in the action stage of change. As such, her goal is to continue with weight loss efforts. She has agreed to the Category 2 Plan and keeping a food journal and adhering to recommended goals of 400-500 calories and 35 grams of protein at  supper.   Adlynn will substitute diet juice. She may continue Cheerios or protein oatmeal with [employer] milk at breakfast. She will try not to skip meals.   Exercise goals:  As is.  Behavioral modification strategies: meal planning and cooking strategies.  Ketsia has agreed to follow-up with our clinic in 2-3 weeks.  Objective:   Blood pressure 123/77, pulse 98, temperature 97.8 F (36.6 C), height 5\' 3"  (1.6 m), weight 290 lb (131.5 kg), SpO2 98 %. Body mass index is 51.37 kg/m.  General: Cooperative, alert, well developed, in no acute distress. HEENT: Conjunctivae and lids unremarkable. Cardiovascular: Regular rhythm.  Lungs: Normal work of breathing. Neurologic: No focal deficits.   Lab Results  Component Value Date   CREATININE 0.82 12/28/2020   BUN 13 12/28/2020   NA 139 12/28/2020   K 3.9 12/28/2020   CL 105 12/28/2020   CO2 24 12/28/2020   Lab Results  Component Value Date   ALT 12 12/28/2020   AST 15 12/28/2020   ALKPHOS 55 12/28/2020   BILITOT 0.3 12/28/2020   Lab Results  Component Value Date   HGBA1C 5.5 12/28/2020   HGBA1C 5.5 12/05/2020   HGBA1C 5.7 (H) 08/03/2020   HGBA1C 5.6 07/18/2020   HGBA1C 5.8 (H) 03/26/2020   Lab Results  Component Value Date   INSULIN 6.0 12/05/2020   INSULIN 10.8 07/18/2020   INSULIN 24.7 03/26/2020   Lab Results  Component Value Date   TSH 2.379 12/28/2020   Lab Results  Component  Value Date   CHOL 167 12/28/2020   HDL 71 12/28/2020   LDLCALC 83 12/28/2020   TRIG 65 12/28/2020   CHOLHDL 2.4 12/28/2020   Lab Results  Component Value Date   VD25OH 52.44 12/28/2020   VD25OH 46.8 12/05/2020   VD25OH 91.02 08/03/2020   Lab Results  Component Value Date   WBC 7.7 04/04/2021   HGB 12.7 04/04/2021   HCT 37.1 04/04/2021   MCV 84.9 04/04/2021   PLT 285 04/04/2021   No results found for: IRON, TIBC, FERRITIN  Attestation Statements:   Reviewed by clinician on day of visit: allergies, medications, problem  list, medical history, surgical history, family history, social history, and previous encounter notes.  Time spent on visit including pre-visit chart review and post-visit care and charting was 37 minutes.   I, Lizbeth Bark, RMA, am acting as Location manager for [employer], [employer].  I have reviewed the above documentation for accuracy and completeness, and I agree with the above. -  Georgianne Fick, FNP

## 2021-05-01 ENCOUNTER — Ambulatory Visit (INDEPENDENT_AMBULATORY_CARE_PROVIDER_SITE_OTHER): Payer: Medicare HMO | Admitting: Internal Medicine

## 2021-05-01 ENCOUNTER — Encounter: Payer: Self-pay | Admitting: Internal Medicine

## 2021-05-01 DIAGNOSIS — R0609 Other forms of dyspnea: Secondary | ICD-10-CM | POA: Diagnosis not present

## 2021-05-01 DIAGNOSIS — G8929 Other chronic pain: Secondary | ICD-10-CM

## 2021-05-01 DIAGNOSIS — E538 Deficiency of other specified B group vitamins: Secondary | ICD-10-CM | POA: Diagnosis not present

## 2021-05-01 DIAGNOSIS — Z6841 Body Mass Index (BMI) 40.0 and over, adult: Secondary | ICD-10-CM | POA: Diagnosis not present

## 2021-05-01 DIAGNOSIS — E559 Vitamin D deficiency, unspecified: Secondary | ICD-10-CM

## 2021-05-01 DIAGNOSIS — M25561 Pain in right knee: Secondary | ICD-10-CM

## 2021-05-01 NOTE — Assessment & Plan Note (Signed)
Oa super B complex

## 2021-05-01 NOTE — Progress Notes (Signed)
Subjective:  Patient ID: Brenda Hernandez, female    DOB: 1975/10/10  Age: 46 y.o. MRN: [id number]  CC: No chief complaint on file.   HPI Nesreen Wolfert presents for fatigue, DOE, knee pain Candace is here with her dad.  She is planning to move in with her mother  Outpatient Medications Prior to Visit  Medication Sig Dispense Refill   B Complex-Folic Acid (B COMPLEX PLUS) TABS Take 1 tablet by mouth daily. 100 tablet 5   clomiPRAMINE (ANAFRANIL) 75 MG capsule Take 75 mg by mouth 2 (two) times daily.     cloZAPine (CLOZARIL) 100 MG tablet Take 500 mg by mouth daily.     ibuprofen (ADVIL) 400 MG tablet Take 1 tablet (400 mg total) by mouth every 8 (eight) hours as needed for moderate pain. 90 tablet 2   levonorgestrel-ethinyl estradiol (ALESSE) 0.1-20 MG-MCG tablet Sronyx 0.1 mg-20 mcg tablet  TAKE 1 TABLET BY MOUTH EVERY DAY     metFORMIN (GLUCOPHAGE) 1000 MG tablet Take 1,000 mg by mouth 2 (two) times daily with a meal.     topiramate (TOPAMAX) 25 MG tablet Take 1 tablet (25 mg total) by mouth at bedtime. (Patient not taking: Reported on 05/21/2021) 30 tablet 0   valproic acid (DEPAKENE) 250 MG/5ML solution Take 250 mg by mouth at bedtime.     No facility-administered medications prior to visit.    ROS: Review of Systems  Constitutional:  Negative for activity change, appetite change, chills, fatigue and unexpected weight change.  HENT:  Negative for congestion, mouth sores and sinus pressure.   Eyes:  Negative for visual disturbance.  Respiratory:  Negative for cough and chest tightness.   Gastrointestinal:  Negative for abdominal pain and nausea.  Genitourinary:  Negative for difficulty urinating, frequency and vaginal pain.  Musculoskeletal:  Positive for arthralgias and gait problem. Negative for back pain.  Skin:  Negative for pallor and rash.  Neurological:  Negative for dizziness, tremors, weakness, numbness and headaches.  Psychiatric/Behavioral:  Negative for confusion,  sleep disturbance and suicidal ideas.    Objective:  BP 128/84 (BP Location: Left Arm, Patient Position: Sitting, Cuff Size: Large)    Pulse 97    Temp 98.1 F (36.7 C) (Oral)    Ht 5\' 3"  (1.6 m)    Wt 294 lb (133.4 kg)    SpO2 96%    BMI 52.08 kg/m   BP Readings from Last 3 Encounters:  05/21/21 134/89  05/01/21 128/84  04/30/21 123/77    Wt Readings from Last 3 Encounters:  05/21/21 293 lb (132.9 kg)  05/01/21 294 lb (133.4 kg)  04/30/21 290 lb (131.5 kg)    Physical Exam Constitutional:      General: She is not in acute distress.    Appearance: She is well-developed. She is obese.  HENT:     Head: Normocephalic.     Right Ear: External ear normal.     Left Ear: External ear normal.     Nose: Nose normal.  Eyes:     General:        Right eye: No discharge.        Left eye: No discharge.     Conjunctiva/sclera: Conjunctivae normal.     Pupils: Pupils are equal, round, and reactive to light.  Neck:     Thyroid: No thyromegaly.     Vascular: No JVD.     Trachea: No tracheal deviation.  Cardiovascular:     Rate and Rhythm: Normal rate and  regular rhythm.     Heart sounds: Normal heart sounds.  Pulmonary:     Effort: No respiratory distress.     Breath sounds: No stridor. No wheezing.  Abdominal:     General: Bowel sounds are normal. There is no distension.     Palpations: Abdomen is soft. There is no mass.     Tenderness: There is no abdominal tenderness. There is no guarding or rebound.  Musculoskeletal:        General: Tenderness present.     Cervical back: Normal range of motion and neck supple. No rigidity.  Lymphadenopathy:     Cervical: No cervical adenopathy.  Skin:    Findings: No erythema or rash.  Neurological:     Mental Status: She is oriented to person, place, and time.     Cranial Nerves: No cranial nerve deficit.     Motor: No abnormal muscle tone.     Coordination: Coordination normal.     Deep Tendon Reflexes: Reflexes normal.  Psychiatric:         Behavior: Behavior normal.        Thought Content: Thought content normal.        Judgment: Judgment normal.  Right knee with pain  Lab Results  Component Value Date   WBC 7.3 05/03/2021   HGB 12.0 05/03/2021   HCT 36.9 05/03/2021   PLT 311 05/03/2021   GLUCOSE 92 12/28/2020   CHOL 167 12/28/2020   TRIG 65 12/28/2020   HDL 71 12/28/2020   LDLCALC 83 12/28/2020   ALT 12 12/28/2020   AST 15 12/28/2020   NA 139 12/28/2020   K 3.9 12/28/2020   CL 105 12/28/2020   CREATININE 0.82 12/28/2020   BUN 13 12/28/2020   CO2 24 12/28/2020   TSH 2.379 12/28/2020   HGBA1C 5.5 12/28/2020    No results found.  Assessment & Plan:   Problem List Items Addressed This Visit     Class 3 severe obesity with serious comorbidity and body mass index (BMI) of 50.0 to 59.9 in adult [facility])    Cont w/wt loss clinic She is planning to move in with her mother who may help her better to lose weight on diet      DOE (dyspnea on exertion)    2023 - deconditioning vs other 2D ECHO      Relevant Orders   ECHOCARDIOGRAM COMPLETE   Knee pain, right    Goal wt 225 lbs to qualify for surgery      Low serum vitamin B12    Oa super B complex      Vitamin D deficiency    On Vit D         No orders of the defined types were placed in this encounter.     Follow-up: Return in about 3 months (around 07/29/2021) for a follow-up visit.  Walker Kehr, MD

## 2021-05-01 NOTE — Assessment & Plan Note (Signed)
2023 - deconditioning vs other 2D ECHO

## 2021-05-01 NOTE — Assessment & Plan Note (Signed)
Goal wt 225 lbs to qualify for surgery

## 2021-05-01 NOTE — Assessment & Plan Note (Addendum)
Cont w/wt loss clinic She is planning to move in with her mother who may help her better to lose weight on diet

## 2021-05-01 NOTE — Patient Instructions (Signed)
Sign up for [employer] [employer] library ( via Libby app on your phone or your ipad). If you don't have a library card  - go to any library branch. They will set you up in 15 minutes. It is free. You can check out books to read and to listen, check out magazines and newspapers, movies etc.   The Obesity Code book by Jason Fung   These suggestions will probably help you to improve your metabolism if you are not overweight and to lose weight if you are overweight: 1.  Reduce your consumption of sugars and starches.  Eliminate high fructose corn syrup from your diet.  Reduce your consumption of processed foods.  For desserts try to have seasonal fruits, berries, nuts, cheeses or dark chocolate with more than 70% cacao. 2.  Do not snack 3.  You do not have to eat breakfast.  If you choose to have breakfast - eat plain greek yogurt, eggs, oatmeal (without sugar) - use honey if you need to. 4.  Drink water, freshly brewed unsweetened tea (green, black or herbal) or coffee.  Do not drink sodas including diet sodas , juices, beverages sweetened with artificial sweeteners. 5.  Reduce your consumption of refined grains. 6.  Avoid protein drinks such as Optifast, Slim fast etc. Eat chicken, fish, meat, dairy and beans for your sources of protein. 7.  Natural unprocessed fats like cold pressed virgin olive oil, butter, coconut oil are good for you.  Eat avocados. 8.  Increase your consumption of fiber.  Fruits, berries, vegetables, whole grains, flaxseed, chia seeds, beans, popcorn, nuts, oatmeal are good sources of fiber 9.  Use vinegar in your diet, i.e. apple cider vinegar, red wine or balsamic vinegar 10.  You can try fasting.  For example you can skip breakfast and lunch every other day (24-hour fast) 11.  Stress reduction, good night sleep, relaxation, meditation, yoga and other physical activity is likely to help you to maintain low weight too. 12.  If you drink alcohol, limit your alcohol intake to no more than  2 drinks a day.  

## 2021-05-01 NOTE — Assessment & Plan Note (Signed)
On Vit D 

## 2021-05-03 ENCOUNTER — Other Ambulatory Visit: Payer: Self-pay

## 2021-05-03 ENCOUNTER — Other Ambulatory Visit (HOSPITAL_COMMUNITY)
Admission: AD | Admit: 2021-05-03 | Discharge: 2021-05-03 | Disposition: A | Discharge status: Home / Self Care | Payer: Medicare HMO | Attending: Psychiatry | Admitting: Psychiatry

## 2021-05-03 DIAGNOSIS — Z79899 Other long term (current) drug therapy: Secondary | ICD-10-CM | POA: Diagnosis not present

## 2021-05-03 LAB — CBC WITH DIFFERENTIAL/PLATELET
Abs Immature Granulocytes: 0.01 10*3/uL (ref 0.00–0.07)
Basophils Absolute: 0 10*3/uL (ref 0.0–0.1)
Basophils Relative: 0 %
Eosinophils Absolute: 0.2 10*3/uL (ref 0.0–0.5)
Eosinophils Relative: 2 %
HCT: 36.9 % (ref 36.0–46.0)
Hemoglobin: 12 g/dL (ref 12.0–15.0)
Immature Granulocytes: 0 %
Lymphocytes Relative: 37 %
Lymphs Abs: 2.7 10*3/uL (ref 0.7–4.0)
MCH: 28.4 pg (ref 26.0–34.0)
MCHC: 32.5 g/dL (ref 30.0–36.0)
MCV: 87.4 fL (ref 80.0–100.0)
Monocytes Absolute: 0.6 10*3/uL (ref 0.1–1.0)
Monocytes Relative: 8 %
Neutro Abs: 3.9 10*3/uL (ref 1.7–7.7)
Neutrophils Relative %: 53 %
Platelets: 311 10*3/uL (ref 150–400)
RBC: 4.22 MIL/uL (ref 3.87–5.11)
RDW: 17.2 % — ABNORMAL HIGH (ref 11.5–15.5)
WBC: 7.3 10*3/uL (ref 4.0–10.5)
nRBC: 0 % (ref 0.0–0.2)

## 2021-05-20 ENCOUNTER — Ambulatory Visit (INDEPENDENT_AMBULATORY_CARE_PROVIDER_SITE_OTHER): Payer: Medicare HMO | Admitting: Family Medicine

## 2021-05-21 ENCOUNTER — Ambulatory Visit (INDEPENDENT_AMBULATORY_CARE_PROVIDER_SITE_OTHER): Payer: Medicare HMO | Admitting: Family Medicine

## 2021-05-21 ENCOUNTER — Other Ambulatory Visit: Payer: Self-pay

## 2021-05-21 ENCOUNTER — Encounter (INDEPENDENT_AMBULATORY_CARE_PROVIDER_SITE_OTHER): Payer: Self-pay | Admitting: Family Medicine

## 2021-05-21 VITALS — BP 134/89 | HR 96 | Temp 98.0°F | Ht 63.0 in | Wt 293.0 lb

## 2021-05-21 DIAGNOSIS — R7303 Prediabetes: Secondary | ICD-10-CM | POA: Diagnosis not present

## 2021-05-21 DIAGNOSIS — R638 Other symptoms and signs concerning food and fluid intake: Secondary | ICD-10-CM

## 2021-05-21 DIAGNOSIS — F39 Unspecified mood [affective] disorder: Secondary | ICD-10-CM | POA: Diagnosis not present

## 2021-05-21 DIAGNOSIS — E669 Obesity, unspecified: Secondary | ICD-10-CM

## 2021-05-21 DIAGNOSIS — Z6841 Body Mass Index (BMI) 40.0 and over, adult: Secondary | ICD-10-CM

## 2021-05-22 NOTE — Progress Notes (Signed)
? ? ? ?Chief Complaint:  ? ?OBESITY ?Brenda Hernandez is here to discuss her progress with her obesity treatment plan along with follow-up of her obesity related diagnoses. Brenda Hernandez is on the Category 2 Plan and keeping a food journal and adhering to recommended goals of 400-500 calories and 35 grams of protein at supper and states she is following her eating plan approximately 25% of the time. Brenda Hernandez states she is not exercising regularly. ? ?Today's visit was #: 21 ?Starting weight: 287 lbs ?Starting date: 03/26/2020 ?Today's weight: 293 lbs ?Today's date: 05/21/2021 ?Total lbs lost to date: 0 ?Total lbs lost since last in-office visit: 0 ? ?Interim History: Brenda Hernandez is a patient of Brenda Hernandez.  This is her first office visit with me.  Breakfast - cereal (Honey Nut Cheerios).  Lunch - skips lunch.  Dinner - 2 hotdogs with macaroni and cheese. ? ?Subjective:  ? ?1. Abnormal craving ?Brenda Hernandez is not taking Topamax.  She has not cleared it with her psychiatrist yet and was not sure it was safe.  She reports having cravings at night after dinner mostly. ? ?2. Prediabetes ?Brenda Hernandez eats primarily simple carbs all day long.  She gets in very little protein. ? 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 ? ?Assessment/Plan:  ?No orders of the defined types were placed in this encounter. ? ? ?There are no discontinued medications.  ? ?No orders of the defined types were placed in this encounter. ?  ? ?1. Abnormal craving ?Brenda Hernandez was advised of the risks and benefits of Topamax.  She will ask her psychiatrist in the near future. ? ?2. Prediabetes ?Change metformin to each morning and at dinner (not before bed).  Increase protein intake. ? ?3. 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  She has follow-up with her psychiatrist in April.  Medication management for mood by Psychiatry. ? ?4. Obesity with current  BMI of 51.9 ? ?Brenda Hernandez is currently in the action stage of change. As such, her goal is to continue with weight loss efforts. She has agreed to the Category 2 Plan or keeping a food journal and adhering to recommended goals of 1200 calories and 90+ grams of protein.  ? ?Gave patient meal plan again and the option to do a daily journal.  Handouts and journaling log provided. ? ?Exercise goals:  As is. ? ?Behavioral modification strategies: increasing lean protein intake, decreasing simple carbohydrates, and no skipping meals. ? ?Brenda Hernandez has agreed to follow-up with our clinic in 2-3 weeks with Brenda Hernandez, Brenda Hernandez or Brenda Hernandez. She was informed of the importance of frequent follow-up visits to maximize her success with intensive lifestyle modifications for her multiple health conditions.  ? ?Objective:  ? ?Blood pressure 134/89, pulse 96, temperature 98 ?F (36.7 ?C), temperature source Oral, height '5\' 3"'$  (1.6 m), weight 293 lb (132.9 kg), SpO2 99 %. ?Body mass index is 51.9 kg/m?. ? ?General: Cooperative, alert, well developed, in no acute distress. ?HEENT: Conjunctivae and lids unremarkable. ?Cardiovascular: Regular rhythm.  ?Lungs: Normal work of breathing. ?Neurologic: No focal deficits.  ? ?Lab Results  ?Component Value Date  ? CREATININE 0.82 12/28/2020  ? BUN 13 12/28/2020  ? NA 139 12/28/2020  ? K 3.9 12/28/2020  ? CL 105 12/28/2020  ? CO2 24 12/28/2020  ? ?Lab Results  ?Component Value Date  ? ALT 12 12/28/2020  ? AST 15 12/28/2020  ? ALKPHOS  55 12/28/2020  ? BILITOT 0.3 12/28/2020  ? ?Lab Results  ?Component Value Date  ? HGBA1C 5.5 12/28/2020  ? HGBA1C 5.5 12/05/2020  ? HGBA1C 5.7 (H) 08/03/2020  ? HGBA1C 5.6 07/18/2020  ? HGBA1C 5.8 (H) 03/26/2020  ? ?Lab Results  ?Component Value Date  ? INSULIN 6.0 12/05/2020  ? INSULIN 10.8 07/18/2020  ? INSULIN 24.7 03/26/2020  ? ?Lab Results  ?Component Value Date  ? TSH 2.379 12/28/2020  ? ?Lab Results  ?Component Value Date  ? CHOL 167 12/28/2020  ? HDL 71  12/28/2020  ? Peters 83 12/28/2020  ? TRIG 65 12/28/2020  ? CHOLHDL 2.4 12/28/2020  ? ?Lab Results  ?Component Value Date  ? VD25OH 52.44 12/28/2020  ? VD25OH 46.8 12/05/2020  ? VD25OH 91.02 08/03/2020  ? ?Lab Results  ?Component Value Date  ? WBC 7.3 05/03/2021  ? HGB 12.0 05/03/2021  ? HCT 36.9 05/03/2021  ? MCV 87.4 05/03/2021  ? PLT 311 05/03/2021  ? ?Obesity Behavioral Intervention:  ? ?Approximately 15 minutes were spent on the discussion below. ? ?ASK: ?We discussed the diagnosis of obesity with Brenda Hernandez today and Brenda Hernandez agreed to give [place] permission to discuss obesity behavioral modification therapy today. ? ?ASSESS: ?Brenda Hernandez has the diagnosis of obesity and her BMI today is 51.9. Brenda Hernandez is in the action stage of change.  ? ?ADVISE: ?Brenda Hernandez was educated on the multiple health risks of obesity as well as the benefit of weight loss to improve her health. She was advised of the need for long term treatment and the importance of lifestyle modifications to improve her current health and to decrease her risk of future health problems. ? ?AGREE: ?Multiple dietary modification options and treatment options were discussed and Brenda Hernandez agreed to follow the recommendations documented in the above note. ? ?ARRANGE: ?Brenda Hernandez was educated on the importance of frequent visits to treat obesity as outlined per CMS and USPSTF guidelines and agreed to schedule her next follow up appointment today. ? ?Attestation Statements:  ? ?Reviewed by clinician on day of visit: allergies, medications, problem list, medical history, surgical history, family history, social history, and previous encounter notes. ? ?I, Water quality scientist, CMA, am acting as transcriptionist for [employer], DO. ? ?I have reviewed the above documentation for accuracy and completeness, and I agree with the above. Brenda Hernandez, D.O. ? ?The [place] was signed into law in 2016 which includes the topic of electronic health records.  This  provides immediate access to information in MyChart.  This includes consultation notes, operative notes, office notes, lab results and pathology reports.  If you have any questions about what you read please let us know at your next visit so we can discuss your concerns and take corrective action if need be.  We are right here with you. ? ?

## 2021-05-30 ENCOUNTER — Other Ambulatory Visit (HOSPITAL_COMMUNITY)
Admission: RE | Admit: 2021-05-30 | Discharge: 2021-05-30 | Disposition: A | Discharge status: Home / Self Care | Payer: Medicare HMO | Attending: Psychiatry | Admitting: Psychiatry

## 2021-05-30 DIAGNOSIS — F259 Schizoaffective disorder, unspecified: Secondary | ICD-10-CM | POA: Insufficient documentation

## 2021-05-30 DIAGNOSIS — Z79899 Other long term (current) drug therapy: Secondary | ICD-10-CM | POA: Diagnosis not present

## 2021-05-30 LAB — CBC WITH DIFFERENTIAL/PLATELET
Abs Immature Granulocytes: 0.04 10*3/uL (ref 0.00–0.07)
Basophils Absolute: 0 10*3/uL (ref 0.0–0.1)
Basophils Relative: 0 %
Eosinophils Absolute: 0 10*3/uL (ref 0.0–0.5)
Eosinophils Relative: 0 %
HCT: 35.8 % — ABNORMAL LOW (ref 36.0–46.0)
Hemoglobin: 12.3 g/dL (ref 12.0–15.0)
Immature Granulocytes: 1 %
Lymphocytes Relative: 40 %
Lymphs Abs: 3.3 10*3/uL (ref 0.7–4.0)
MCH: 29.5 pg (ref 26.0–34.0)
MCHC: 34.4 g/dL (ref 30.0–36.0)
MCV: 85.9 fL (ref 80.0–100.0)
Monocytes Absolute: 0.7 10*3/uL (ref 0.1–1.0)
Monocytes Relative: 9 %
Neutro Abs: 4.2 10*3/uL (ref 1.7–7.7)
Neutrophils Relative %: 50 %
Platelets: 297 10*3/uL (ref 150–400)
RBC: 4.17 MIL/uL (ref 3.87–5.11)
RDW: 16 % — ABNORMAL HIGH (ref 11.5–15.5)
WBC: 8.3 10*3/uL (ref 4.0–10.5)
nRBC: 0 % (ref 0.0–0.2)

## 2021-06-04 ENCOUNTER — Encounter (INDEPENDENT_AMBULATORY_CARE_PROVIDER_SITE_OTHER): Payer: Self-pay | Admitting: Nurse Practitioner

## 2021-06-04 ENCOUNTER — Ambulatory Visit (INDEPENDENT_AMBULATORY_CARE_PROVIDER_SITE_OTHER): Payer: Medicare HMO | Admitting: Nurse Practitioner

## 2021-06-04 ENCOUNTER — Other Ambulatory Visit: Payer: Self-pay

## 2021-06-04 VITALS — BP 120/87 | HR 97 | Temp 98.0°F | Ht 63.0 in | Wt 287.0 lb

## 2021-06-04 DIAGNOSIS — R7303 Prediabetes: Secondary | ICD-10-CM | POA: Diagnosis not present

## 2021-06-04 DIAGNOSIS — Z6841 Body Mass Index (BMI) 40.0 and over, adult: Secondary | ICD-10-CM | POA: Diagnosis not present

## 2021-06-04 DIAGNOSIS — E669 Obesity, unspecified: Secondary | ICD-10-CM | POA: Diagnosis not present

## 2021-06-04 DIAGNOSIS — R638 Other symptoms and signs concerning food and fluid intake: Secondary | ICD-10-CM

## 2021-06-04 DIAGNOSIS — F39 Unspecified mood [affective] disorder: Secondary | ICD-10-CM

## 2021-06-04 NOTE — Progress Notes (Signed)
? ? ? ?Chief Complaint:  ? ?OBESITY ?Vallarie is here to discuss her progress with her obesity treatment plan along with follow-up of her obesity related diagnoses. Novella is on the Category 2 Plan and keeping a food journal and adhering to recommended goals of 1200 calories and 90 grams of protein and states she is following her eating plan approximately 25% of the time. Foy states she is not exercising regularly. ? ?Today's visit was #: 13 ?Starting weight: 287 lbs ?Starting date: 03/26/2020 ?Today's weight: 287 lbs ?Today's date: 06/04/2021 ?Total lbs lost to date: 0 ?Total lbs lost since last in-office visit: 6 lbs ? ?Interim History: Evann has done well with weight loss since her last visit.  She has tried LCP and journaling in the past.  Breakfast - cereal with Fairlife milk, [employer] biscuit on the weekends.  Lunch:  Chicken salad, apple on a mini croissant.  Dinner:  Cytogeneticist.  Struggles with hunger and cravings.  ? ?Discussed Category 2 and journaling.  Handouts given on both.  Patient had to leave early with her appointment with me for another appointment.  Will discuss in more detail at next visit. ? ?Subjective:  ? ?1. Prediabetes ?Last A1c was 5.5.  She is taking metformin 1000 mg BID.  Denies side effects. ? ?2. Abnormal craving ?Not taking Topamax.  Plans to discuss with her psychiatrist.  Struggles with cravings. ? 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  She is taking Anafranil, clozapine, Depakene.  Denies side effects. ? ?Assessment/Plan:  ? ?1. Prediabetes ?Continue to follow-up with PCP.  Continue medications as directed. ? ?2. Abnormal craving ?To discuss Topamax with her psychiatrist. ? ?3. 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  Continue medications as directed. ? ?4. Obesity with current BMI 51.0 ? ?Shaneil is currently in the action stage of change. As such, her goal  is to continue with weight loss efforts. She has agreed to the Category 2 Plan or keeping a food journal and adhering to recommended goals of 1200 calories and 85+ grams of protein.  ? ?Exercise goals:  As is. ? ?Behavioral modification strategies: increasing lean protein intake, increasing water intake, no skipping meals, and meal planning and cooking strategies. ? ?Erminia has agreed to follow-up with our clinic in 3 weeks. She was informed of the importance of frequent follow-up visits to maximize her success with intensive lifestyle modifications for her multiple health conditions.  ? ?Objective:  ? ?Blood pressure 120/87, pulse 97, temperature 98 ?F (36.7 ?C), height '5\' 3"'$  (1.6 m), weight 287 lb (130.2 kg), SpO2 97 %. ?Body mass index is 50.84 kg/m?. ? ?General: Cooperative, alert, well developed, in no acute distress. ?HEENT: Conjunctivae and lids unremarkable. ?Cardiovascular: Regular rhythm.  ?Lungs: Normal work of breathing. ?Neurologic: No focal deficits.  ? ?Lab Results  ?Component Value Date  ? CREATININE 0.82 12/28/2020  ? BUN 13 12/28/2020  ? NA 139 12/28/2020  ? K 3.9 12/28/2020  ? CL 105 12/28/2020  ? CO2 24 12/28/2020  ? ?Lab Results  ?Component Value Date  ? ALT 12 12/28/2020  ? AST 15 12/28/2020  ? ALKPHOS 55 12/28/2020  ? BILITOT 0.3 12/28/2020  ? ?Lab Results  ?Component Value Date  ? HGBA1C 5.5 12/28/2020  ? HGBA1C 5.5 12/05/2020  ? HGBA1C 5.7 (H) 08/03/2020  ? HGBA1C 5.6 07/18/2020  ? HGBA1C 5.8 (H) 03/26/2020  ? ?Lab Results  ?Component Value  Date  ? INSULIN 6.0 12/05/2020  ? INSULIN 10.8 07/18/2020  ? INSULIN 24.7 03/26/2020  ? ?Lab Results  ?Component Value Date  ? TSH 2.379 12/28/2020  ? ?Lab Results  ?Component Value Date  ? CHOL 167 12/28/2020  ? HDL 71 12/28/2020  ? [place] 83 12/28/2020  ? TRIG 65 12/28/2020  ? CHOLHDL 2.4 12/28/2020  ? ?Lab Results  ?Component Value Date  ? VD25OH 52.44 12/28/2020  ? VD25OH 46.8 12/05/2020  ? VD25OH 91.02 08/03/2020  ? ?Lab Results  ?Component Value  Date  ? WBC 8.3 05/30/2021  ? HGB 12.3 05/30/2021  ? HCT 35.8 (L) 05/30/2021  ? MCV 85.9 05/30/2021  ? PLT 297 05/30/2021  ? ?Attestation Statements:  ? ?Reviewed by clinician on day of visit: allergies, medications, problem list, medical history, surgical history, family history, social history, and previous encounter notes. ? ?I, Water quality scientist, CMA, am acting as transcriptionist for Everardo Pacific, [place]. ? ?I have reviewed the above documentation for accuracy and completeness, and I agree with the above. Everardo Pacific, FNP ? ?

## 2021-06-11 ENCOUNTER — Ambulatory Visit (HOSPITAL_COMMUNITY): Payer: Medicare HMO

## 2021-06-18 ENCOUNTER — Ambulatory Visit (INDEPENDENT_AMBULATORY_CARE_PROVIDER_SITE_OTHER): Payer: Medicare HMO | Admitting: Nurse Practitioner

## 2021-06-18 ENCOUNTER — Encounter (INDEPENDENT_AMBULATORY_CARE_PROVIDER_SITE_OTHER): Payer: Self-pay | Admitting: Nurse Practitioner

## 2021-06-18 VITALS — BP 121/88 | HR 101 | Temp 97.7°F | Ht 63.0 in | Wt 285.0 lb

## 2021-06-18 DIAGNOSIS — Z6841 Body Mass Index (BMI) 40.0 and over, adult: Secondary | ICD-10-CM

## 2021-06-18 DIAGNOSIS — E538 Deficiency of other specified B group vitamins: Secondary | ICD-10-CM

## 2021-06-18 DIAGNOSIS — E669 Obesity, unspecified: Secondary | ICD-10-CM

## 2021-06-18 DIAGNOSIS — R638 Other symptoms and signs concerning food and fluid intake: Secondary | ICD-10-CM | POA: Diagnosis not present

## 2021-06-18 DIAGNOSIS — R7303 Prediabetes: Secondary | ICD-10-CM | POA: Diagnosis not present

## 2021-06-18 DIAGNOSIS — E559 Vitamin D deficiency, unspecified: Secondary | ICD-10-CM | POA: Diagnosis not present

## 2021-06-19 NOTE — Progress Notes (Signed)
? ? ? ?Chief Complaint:  ? ?OBESITY ?Brenda Hernandez is here to discuss her progress with her obesity treatment plan along with follow-up of her obesity related diagnoses. Brenda Hernandez is on the Category 2 Plan or 1200 calories and 85+ grams of protein and states she is following her eating plan approximately 25% of the time. Brenda Hernandez states she is strength exercise 25 minutes 7 times per week. ? ?Today's visit was #: 23 ?Starting weight: 287 lbs ?Starting date: 03/26/2020 ?Today's weight: 285 lbs ?Today's date: 06/19/2021 ?Total lbs lost to date: 2 ?Total lbs lost since last in-office visit: 2 ? ?Interim History: Brenda Hernandez has done well since her last visit. Breakfast, Honey Nut Cheerios with Fairlife Fat Free milk. Lunch, she skips, snacks is oatmeal. Dinner: hotdog, chicken salad, sausage and veggies. Drinking juice and water. Notes some hunger and some cravings. ? ?Subjective:  ? ?1. Prediabetes ?Brenda Hernandez is currently taking Metformin 1,000 mg twice a day. Denies any side effects. ? ?2. Abnormal craving ?Brenda Hernandez has not started Topamax yet. She plans to discuss with psychiatrist. ? ?3. Vitamin B12 deficiency ?Brenda Hernandez is currently taking an over the counter B12 supplement. Denies any side effects. ? ?4. Vitamin D deficiency ?Brenda Hernandez is not currently on Vit D. ? ?Assessment/Plan:  ? ?1. Prediabetes ?Brenda Hernandez will continue to work on weight loss, exercise, and decreasing simple carbohydrates to help decrease the risk of diabetes.   ?Labs obtained today. ? ?- Comprehensive metabolic panel ?- Hemoglobin A1c ?- Insulin, random ? ?2. Abnormal craving ?Brenda Hernandez to discuss with psychiatry.  ? ?3. Vitamin B12 deficiency ?The diagnosis was reviewed with the patient. Counseling provided today, see below. We will continue to monitor. Orders and follow up as documented in patient record. ? ?Counseling ?The body needs vitamin B12: to make red blood cells; to make DNA; and to help the nerves work properly so they can carry messages from the brain  to the body.  ?The main causes of vitamin B12 deficiency include dietary deficiency, digestive diseases, pernicious anemia, and having a surgery in which part of the stomach or small intestine is removed.  ?Certain medicines can make it harder for the body to absorb vitamin B12. These medicines include: heartburn medications; some antibiotics; some medications used to treat diabetes, gout, and high cholesterol.  ?In some cases, there are no symptoms of this condition. If the condition leads to anemia or nerve damage, various symptoms can occur, such as weakness or fatigue, shortness of breath, and numbness or tingling in your hands and feet.   ?Treatment:  ?May include taking vitamin B12 supplements.  ?Avoid alcohol.  ?Eat lots of healthy foods that contain vitamin B12: ?Beef, pork, chicken, [place], and organ meats, such as liver.  ?Seafood: This includes clams, rainbow trout, salmon, tuna, and haddock. Eggs.  ?Cereal and dairy products that are fortified: This means that vitamin B12 has been added to the food.   ?Labs obtained today. ? ?- Comprehensive metabolic panel ? ?4. Vitamin D deficiency ?Low Vitamin D level contributes to fatigue and are associated with obesity, breast, and colon cancer. We will continue to monitor Vit D. ? ?5. Obesity with current BMI 50.6 ?Brenda Hernandez is currently in the action stage of change. As such, her goal is to continue with weight loss efforts. She has agreed to the Category 2 Plan or 1200 calories and 85 grams of protein. ? ?Exercise goals: As is. ? ?Behavioral modification strategies: increasing lean protein intake, increasing water intake, no skipping meals, and meal planning and cooking  strategies. ? ?Brenda Hernandez has agreed to follow-up with our clinic in 2 weeks. She was informed of the importance of frequent follow-up visits to maximize her success with intensive lifestyle modifications for her multiple health conditions.  ? ?Brenda Hernandez was informed we would discuss her lab results at  her next visit unless there is a critical issue that needs to be addressed sooner. Brenda Hernandez agreed to keep her next visit at the agreed upon time to discuss these results. ? ?Objective:  ? ?Blood pressure 121/88, pulse (!) 101, temperature 97.7 ?F (36.5 ?C), height '5\' 3"'$  (1.6 m), weight 285 lb (129.3 kg), SpO2 97 %. ?Body mass index is 50.49 kg/m?. ? ?General: Cooperative, alert, well developed, in no acute distress. ?HEENT: Conjunctivae and lids unremarkable. ?Cardiovascular: Regular rhythm.  ?Lungs: Normal work of breathing. ?Neurologic: No focal deficits.  ? ?Lab Results  ?Component Value Date  ? CREATININE WILL FOLLOW 06/18/2021  ? BUN WILL FOLLOW 06/18/2021  ? NA WILL FOLLOW 06/18/2021  ? K WILL FOLLOW 06/18/2021  ? CL WILL FOLLOW 06/18/2021  ? CO2 WILL FOLLOW 06/18/2021  ? ?Lab Results  ?Component Value Date  ? ALT WILL FOLLOW 06/18/2021  ? AST WILL FOLLOW 06/18/2021  ? ALKPHOS WILL FOLLOW 06/18/2021  ? BILITOT WILL FOLLOW 06/18/2021  ? ?Lab Results  ?Component Value Date  ? HGBA1C 5.6 06/18/2021  ? HGBA1C 5.5 12/28/2020  ? HGBA1C 5.5 12/05/2020  ? HGBA1C 5.7 (H) 08/03/2020  ? HGBA1C 5.6 07/18/2020  ? ?Lab Results  ?Component Value Date  ? INSULIN 14.1 06/18/2021  ? INSULIN 6.0 12/05/2020  ? INSULIN 10.8 07/18/2020  ? INSULIN 24.7 03/26/2020  ? ?Lab Results  ?Component Value Date  ? TSH 2.379 12/28/2020  ? ?Lab Results  ?Component Value Date  ? CHOL 167 12/28/2020  ? HDL 71 12/28/2020  ? [place] 83 12/28/2020  ? TRIG 65 12/28/2020  ? CHOLHDL 2.4 12/28/2020  ? ?Lab Results  ?Component Value Date  ? VD25OH 52.44 12/28/2020  ? VD25OH 46.8 12/05/2020  ? VD25OH 91.02 08/03/2020  ? ?Lab Results  ?Component Value Date  ? WBC 8.3 05/30/2021  ? HGB 12.3 05/30/2021  ? HCT 35.8 (L) 05/30/2021  ? MCV 85.9 05/30/2021  ? PLT 297 05/30/2021  ? ?No results found for: IRON, TIBC, FERRITIN ? ?Obesity Behavioral Intervention:  ? ?Approximately 15 minutes were spent on the discussion below. ? ?ASK: ?We discussed the diagnosis of  obesity with Brenda Hernandez today and Brenda Hernandez agreed to give [place] permission to discuss obesity behavioral modification therapy today. ? ?ASSESS: ?Brenda Hernandez has the diagnosis of obesity and her BMI today is 50.6. Brenda Hernandez is in the action stage of change.  ? ?ADVISE: ?Brenda Hernandez was educated on the multiple health risks of obesity as well as the benefit of weight loss to improve her health. She was advised of the need for long term treatment and the importance of lifestyle modifications to improve her current health and to decrease her risk of future health problems. ? ?AGREE: ?Multiple dietary modification options and treatment options were discussed and Brenda Hernandez agreed to follow the recommendations documented in the above note. ? ?ARRANGE: ?Brenda Hernandez was educated on the importance of frequent visits to treat obesity as outlined per CMS and USPSTF guidelines and agreed to schedule her next follow up appointment today. ? ?Attestation Statements:  ? ?Reviewed by clinician on day of visit: allergies, medications, problem list, medical history, surgical history, family history, social history, and previous encounter notes. ?  ?I, Brendell Tyus, am acting as  transcriptionist for Everardo Pacific, [place].Marland Kitchen ? ?I have reviewed the above documentation for accuracy and completeness, and I agree with the above. Everardo Pacific, FNP  ?

## 2021-06-20 ENCOUNTER — Ambulatory Visit (HOSPITAL_COMMUNITY): Payer: Medicare HMO | Attending: Cardiology

## 2021-06-20 DIAGNOSIS — R0609 Other forms of dyspnea: Secondary | ICD-10-CM | POA: Diagnosis not present

## 2021-06-20 LAB — COMPREHENSIVE METABOLIC PANEL
ALT: 11 IU/L (ref 0–32)
AST: 17 IU/L (ref 0–40)
Albumin/Globulin Ratio: 1.4 (ref 1.2–2.2)
Albumin: 3.9 g/dL (ref 3.8–4.8)
Alkaline Phosphatase: 74 IU/L (ref 44–121)
BUN/Creatinine Ratio: 15 (ref 9–23)
BUN: 15 mg/dL (ref 6–24)
Bilirubin Total: <0.2 mg/dL (ref 0.0–1.2)
CO2: 22 mmol/L (ref 20–29)
Calcium: 8.9 mg/dL (ref 8.7–10.2)
Chloride: 102 mmol/L (ref 96–106)
Creatinine, Ser: 0.99 mg/dL (ref 0.57–1.00)
Globulin, Total: 2.7 g/dL (ref 1.5–4.5)
Glucose: 94 mg/dL (ref 70–99)
Potassium: 4.4 mmol/L (ref 3.5–5.2)
Sodium: 141 mmol/L (ref 134–144)
Total Protein: 6.6 g/dL (ref 6.0–8.5)
eGFR: 73 mL/min/{1.73_m2} (ref >59)

## 2021-06-20 LAB — ECHOCARDIOGRAM COMPLETE
Area-P 1/2: 4.17 cm2
Calc EF: 59.6 %
S' Lateral: 2.8 cm
Single Plane A2C EF: 57.5 %
Single Plane A4C EF: 63.7 %

## 2021-06-20 LAB — HEMOGLOBIN A1C
Est. average glucose Bld gHb Est-mCnc: 114 mg/dL
Hgb A1c MFr Bld: 5.6 % (ref 4.8–5.6)

## 2021-06-20 LAB — INSULIN, RANDOM: INSULIN: 14.1 u[IU]/mL (ref 2.6–24.9)

## 2021-06-23 DIAGNOSIS — F259 Schizoaffective disorder, unspecified: Secondary | ICD-10-CM | POA: Diagnosis not present

## 2021-06-23 DIAGNOSIS — F429 Obsessive-compulsive disorder, unspecified: Secondary | ICD-10-CM | POA: Diagnosis not present

## 2021-06-23 DIAGNOSIS — G2401 Drug induced subacute dyskinesia: Secondary | ICD-10-CM | POA: Diagnosis not present

## 2021-06-24 DIAGNOSIS — R5383 Other fatigue: Secondary | ICD-10-CM | POA: Diagnosis not present

## 2021-06-24 DIAGNOSIS — R509 Fever, unspecified: Secondary | ICD-10-CM | POA: Diagnosis not present

## 2021-06-24 DIAGNOSIS — Z03818 Encounter for observation for suspected exposure to other biological agents ruled out: Secondary | ICD-10-CM | POA: Diagnosis not present

## 2021-06-24 DIAGNOSIS — R051 Acute cough: Secondary | ICD-10-CM | POA: Diagnosis not present

## 2021-06-24 DIAGNOSIS — J069 Acute upper respiratory infection, unspecified: Secondary | ICD-10-CM | POA: Diagnosis not present

## 2021-06-28 ENCOUNTER — Other Ambulatory Visit (HOSPITAL_COMMUNITY)
Admission: AD | Admit: 2021-06-28 | Discharge: 2021-06-28 | Disposition: A | Discharge status: Home / Self Care | Payer: Medicare HMO | Source: Ambulatory Visit | Attending: Psychiatry | Admitting: Psychiatry

## 2021-06-28 DIAGNOSIS — E559 Vitamin D deficiency, unspecified: Secondary | ICD-10-CM | POA: Insufficient documentation

## 2021-06-28 DIAGNOSIS — R638 Other symptoms and signs concerning food and fluid intake: Secondary | ICD-10-CM | POA: Insufficient documentation

## 2021-06-28 DIAGNOSIS — E538 Deficiency of other specified B group vitamins: Secondary | ICD-10-CM | POA: Diagnosis not present

## 2021-06-28 DIAGNOSIS — R7303 Prediabetes: Secondary | ICD-10-CM | POA: Diagnosis not present

## 2021-06-28 DIAGNOSIS — Z6841 Body Mass Index (BMI) 40.0 and over, adult: Secondary | ICD-10-CM | POA: Diagnosis not present

## 2021-06-28 LAB — COMPREHENSIVE METABOLIC PANEL
ALT: 14 U/L (ref 0–44)
AST: 17 U/L (ref 15–41)
Albumin: 2.8 g/dL — ABNORMAL LOW (ref 3.5–5.0)
Alkaline Phosphatase: 64 U/L (ref 38–126)
Anion gap: 6 (ref 5–15)
BUN: 8 mg/dL (ref 6–20)
CO2: 27 mmol/L (ref 22–32)
Calcium: 8.1 mg/dL — ABNORMAL LOW (ref 8.9–10.3)
Chloride: 105 mmol/L (ref 98–111)
Creatinine, Ser: 0.9 mg/dL (ref 0.44–1.00)
GFR, Estimated: >60 mL/min (ref >60)
Glucose, Bld: 95 mg/dL (ref 70–99)
Potassium: 4 mmol/L (ref 3.5–5.1)
Sodium: 138 mmol/L (ref 135–145)
Total Bilirubin: 0.6 mg/dL (ref 0.3–1.2)
Total Protein: 6.3 g/dL — ABNORMAL LOW (ref 6.5–8.1)

## 2021-06-28 LAB — LIPID PANEL
Cholesterol: 153 mg/dL (ref 0–200)
HDL: 62 mg/dL (ref >40)
LDL Cholesterol: 74 mg/dL (ref 0–99)
Total CHOL/HDL Ratio: 2.5 RATIO
Triglycerides: 86 mg/dL (ref <150)
VLDL: 17 mg/dL (ref 0–40)

## 2021-06-28 LAB — HEMOGLOBIN A1C
Hgb A1c MFr Bld: 5.6 % (ref 4.8–5.6)
Mean Plasma Glucose: 114.02 mg/dL

## 2021-07-02 ENCOUNTER — Encounter (INDEPENDENT_AMBULATORY_CARE_PROVIDER_SITE_OTHER): Payer: Self-pay | Admitting: Nurse Practitioner

## 2021-07-02 ENCOUNTER — Ambulatory Visit (INDEPENDENT_AMBULATORY_CARE_PROVIDER_SITE_OTHER): Payer: Medicare HMO | Admitting: Nurse Practitioner

## 2021-07-02 ENCOUNTER — Telehealth: Payer: Self-pay | Admitting: Internal Medicine

## 2021-07-02 VITALS — BP 113/83 | HR 100 | Temp 98.0°F | Ht 63.0 in | Wt 279.0 lb

## 2021-07-02 DIAGNOSIS — Z6841 Body Mass Index (BMI) 40.0 and over, adult: Secondary | ICD-10-CM

## 2021-07-02 DIAGNOSIS — R638 Other symptoms and signs concerning food and fluid intake: Secondary | ICD-10-CM | POA: Diagnosis not present

## 2021-07-02 DIAGNOSIS — R7303 Prediabetes: Secondary | ICD-10-CM | POA: Diagnosis not present

## 2021-07-02 DIAGNOSIS — E669 Obesity, unspecified: Secondary | ICD-10-CM

## 2021-07-02 NOTE — Telephone Encounter (Signed)
Pt requesting an order for a walker due to a broken knee she states provider is aware of ?

## 2021-07-04 NOTE — Telephone Encounter (Signed)
Ok Thx 

## 2021-07-04 NOTE — Telephone Encounter (Signed)
Notified pt rx ready for pick-up.../lmb 

## 2021-07-14 NOTE — Progress Notes (Signed)
? ? ? ?Chief Complaint:  ? ?OBESITY ?Travia is here to discuss her progress with her obesity treatment plan along with follow-up of her obesity related diagnoses. Makayleigh is on the Category 2 Plan or keeping a food journal and adhering to recommended goals of 1200 calories and 85 grams of protein and states she is following her eating plan approximately 25% of the time. Sherran states she is doing strength training and walking for 10-15 minutes 7 times per week. ? ?Today's visit was #: 24 ?Starting weight: 287 lbs ?Starting date: 03/26/2020 ?Today's weight: 279 lbs ?Today's date: 07/02/2021 ?Total lbs lost to date: 8 lbs ?Total lbs lost since last in-office visit: 6 lbs ? ?Interim History: Layanna has done well with weight loss. She has been eating more sandwiches and eggs since her last visit. She is limiting sweets intake. She is no longer skipping lunch. Her hunger and cravings have decreased but are still present.   ? ?Subjective:  ? ?1. Prediabetes ?Vivienne is currently taking Metformin 1,000 mg twice daily. She denies side effects. Her hungers and cravings have decreased since taking Metformin on a regular basis.  ? ?2. Abnormal craving ?Marelin still hasn't heard back from her psychiatrist about starting Topamax. Her next appointment is in October.  ? ?Assessment/Plan:  ? ?1. Prediabetes ?Anusha will continue her medications as directed. She will continue to work on weight loss, exercise, and decreasing simple carbohydrates to help decrease the risk of diabetes.  ? ?2. Abnormal craving ?Hester will continue to follow up with psychiatrist. Plans to discuss Topamax with psychiatry.  ? ?3. Obesity with current BMI 49.5 ?Jalena is currently in the action stage of change. As such, her goal is to continue with weight loss efforts. She has agreed to the Category 2 Plan.  ? ?Exercise goals: As is.  ? ?Behavioral modification strategies: increasing water intake, no skipping meals, better snacking choices, and  emotional eating strategies. ? ?Marielena has agreed to follow-up with our clinic in 4 weeks. She was informed of the importance of frequent follow-up visits to maximize her success with intensive lifestyle modifications for her multiple health conditions.  ? ?Objective:  ? ?Blood pressure 113/83, pulse 100, temperature 98 ?F (36.7 ?C), height '5\' 3"'$  (1.6 m), weight 279 lb (126.6 kg), last menstrual period 06/25/2021, SpO2 97 %. ?Body mass index is 49.42 kg/m?. ? ?General: Cooperative, alert, well developed, in no acute distress. ?HEENT: Conjunctivae and lids unremarkable. ?Cardiovascular: Regular rhythm.  ?Lungs: Normal work of breathing. ?Neurologic: No focal deficits.  ? ?Lab Results  ?Component Value Date  ? CREATININE 0.90 06/28/2021  ? BUN 8 06/28/2021  ? NA 138 06/28/2021  ? K 4.0 06/28/2021  ? CL 105 06/28/2021  ? CO2 27 06/28/2021  ? ?Lab Results  ?Component Value Date  ? ALT 14 06/28/2021  ? AST 17 06/28/2021  ? ALKPHOS 64 06/28/2021  ? BILITOT 0.6 06/28/2021  ? ?Lab Results  ?Component Value Date  ? HGBA1C 5.6 06/28/2021  ? HGBA1C 5.6 06/18/2021  ? HGBA1C 5.5 12/28/2020  ? HGBA1C 5.5 12/05/2020  ? HGBA1C 5.7 (H) 08/03/2020  ? ?Lab Results  ?Component Value Date  ? INSULIN 14.1 06/18/2021  ? INSULIN 6.0 12/05/2020  ? INSULIN 10.8 07/18/2020  ? INSULIN 24.7 03/26/2020  ? ?Lab Results  ?Component Value Date  ? TSH 2.379 12/28/2020  ? ?Lab Results  ?Component Value Date  ? CHOL 153 06/28/2021  ? HDL 62 06/28/2021  ? [place] 74 06/28/2021  ? TRIG 86  06/28/2021  ? CHOLHDL 2.5 06/28/2021  ? ?Lab Results  ?Component Value Date  ? VD25OH 52.44 12/28/2020  ? VD25OH 46.8 12/05/2020  ? VD25OH 91.02 08/03/2020  ? ?Lab Results  ?Component Value Date  ? WBC 8.3 05/30/2021  ? HGB 12.3 05/30/2021  ? HCT 35.8 (L) 05/30/2021  ? MCV 85.9 05/30/2021  ? PLT 297 05/30/2021  ? ?No results found for: IRON, TIBC, FERRITIN ? ?Attestation Statements:  ? ?Reviewed by clinician on day of visit: allergies, medications, problem list,  medical history, surgical history, family history, social history, and previous encounter notes. ? ?Time spent on visit including pre-visit chart review and post-visit care and charting was 30 minutes.  ? ?I, Lizbeth Bark, RMA, am acting as Location manager for Everardo Pacific, FNP. ? ?I have reviewed the above documentation for accuracy and completeness, and I agree with the above. Everardo Pacific, FNP  ?

## 2021-07-30 ENCOUNTER — Ambulatory Visit (INDEPENDENT_AMBULATORY_CARE_PROVIDER_SITE_OTHER): Payer: Medicare HMO | Admitting: Nurse Practitioner

## 2021-07-30 ENCOUNTER — Encounter (INDEPENDENT_AMBULATORY_CARE_PROVIDER_SITE_OTHER): Payer: Self-pay | Admitting: Nurse Practitioner

## 2021-07-30 VITALS — BP 132/89 | HR 106 | Temp 97.8°F | Ht 63.0 in | Wt 285.0 lb

## 2021-07-30 DIAGNOSIS — F39 Unspecified mood [affective] disorder: Secondary | ICD-10-CM | POA: Diagnosis not present

## 2021-07-30 DIAGNOSIS — E669 Obesity, unspecified: Secondary | ICD-10-CM | POA: Diagnosis not present

## 2021-07-30 DIAGNOSIS — Z6841 Body Mass Index (BMI) 40.0 and over, adult: Secondary | ICD-10-CM | POA: Diagnosis not present

## 2021-07-30 DIAGNOSIS — R7303 Prediabetes: Secondary | ICD-10-CM | POA: Diagnosis not present

## 2021-07-31 ENCOUNTER — Ambulatory Visit: Payer: Medicare HMO | Admitting: Internal Medicine

## 2021-07-31 NOTE — Progress Notes (Signed)
Chief Complaint:   OBESITY Brenda Hernandez is here to discuss her progress with her obesity treatment plan along with follow-up of her obesity related diagnoses. Brenda Hernandez is on the Category 2 Plan and states she is following her eating plan approximately 25% of the time. Brenda Hernandez states she is doing strength training for 15-20 minutes 7 times per week.  Today's visit was #: 25 Starting weight: 287 lbs Starting date: 03/26/2020 Today's weight: 285 lbs Today's date: 07/30/2021 Total lbs lost to date: 2 lbs Total lbs lost since last in-office visit: 0  Interim History: Brenda Hernandez has gotten off track since her last visit. She has been eating a lot of sugary cereal especially over the last week. She has tried protein cereal in the past and didn't like them. She is eating more sweets. She is not following the plan. She is unsure of calories or protein intake. She is drinking juice, limited water. She wonders if it is "eating out of habit".   Subjective:   1. Prediabetes [name]'s last A1C was 5.6. She is taking Metformin 1000 mg twice daily. She denies side effects. She reports polyphagia and cravings.   2. 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   Assessment/Plan:   1. Prediabetes Brenda Hernandez will continue Metformin as directed. She will continue to work on weight loss, exercise, and decreasing simple carbohydrates to help decrease the risk of diabetes.   2. 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 Behavior modification techniques were discussed today to help [name] deal with her emotional/non-hunger eating behaviors.  Orders and follow up as documented in patient record.   3. Obesity with current BMI 50.5 Brenda Hernandez is currently in the action stage of change. As such, her goal is to continue with weight loss efforts. She has agreed to keeping a food journal  and adhering to recommended goals of 1200 calories and 75 plus grams of protein.   Kalli will track calories using MyFitnessPal. We will review [place] at next office visit.   Exercise goals:  As is.   Behavioral modification strategies: increasing vegetables, increasing water intake, no skipping meals, meal planning and cooking strategies, and keeping a strict food journal.  Brenda Hernandez has agreed to follow-up with our clinic in 4 weeks. She was informed of the importance of frequent follow-up visits to maximize her success with intensive lifestyle modifications for her multiple health conditions.   Objective:   Blood pressure 132/89, pulse (!) 106, temperature 97.8 F (36.6 C), height '5\' 3"'$  (1.6 m), weight 285 lb (129.3 kg), SpO2 99 %. Body mass index is 50.49 kg/m.  General: Cooperative, alert, well developed, in no acute distress. HEENT: Conjunctivae and lids unremarkable. Cardiovascular: Regular rhythm.  Lungs: Normal work of breathing. Neurologic: No focal deficits.   Lab Results  Component Value Date   CREATININE 0.90 06/28/2021   BUN 8 06/28/2021   NA 138 06/28/2021   K 4.0 06/28/2021   CL 105 06/28/2021   CO2 27 06/28/2021   Lab Results  Component Value Date   ALT 14 06/28/2021   AST 17 06/28/2021   ALKPHOS 64 06/28/2021   BILITOT 0.6 06/28/2021   Lab Results  Component Value Date   HGBA1C 5.6 06/28/2021   HGBA1C 5.6 06/18/2021   HGBA1C 5.5 12/28/2020   HGBA1C 5.5 12/05/2020   HGBA1C 5.7 (H) 08/03/2020   Lab Results  Component Value Date  INSULIN 14.1 06/18/2021   INSULIN 6.0 12/05/2020   INSULIN 10.8 07/18/2020   INSULIN 24.7 03/26/2020   Lab Results  Component Value Date   TSH 2.379 12/28/2020   Lab Results  Component Value Date   CHOL 153 06/28/2021   HDL 62 06/28/2021   LDLCALC 74 06/28/2021   TRIG 86 06/28/2021   CHOLHDL 2.5 06/28/2021   Lab Results  Component Value Date   VD25OH 52.44 12/28/2020   VD25OH 46.8 12/05/2020   VD25OH 91.02  08/03/2020   Lab Results  Component Value Date   WBC 8.3 05/30/2021   HGB 12.3 05/30/2021   HCT 35.8 (L) 05/30/2021   MCV 85.9 05/30/2021   PLT 297 05/30/2021   No results found for: IRON, TIBC, FERRITIN  Attestation Statements:   Reviewed by clinician on day of visit: allergies, medications, problem list, medical history, surgical history, family history, social history, and previous encounter notes.  Time spent on visit including pre-visit chart review and post-visit care and charting was 30 minutes.   I, Lizbeth Bark, RMA, am acting as Location manager for Everardo Pacific, FNP.  I have reviewed the above documentation for accuracy and completeness, and I agree with the above. Everardo Pacific, FNP

## 2021-08-01 ENCOUNTER — Other Ambulatory Visit (HOSPITAL_COMMUNITY)
Admission: RE | Admit: 2021-08-01 | Discharge: 2021-08-01 | Disposition: A | Discharge status: Home / Self Care | Payer: Medicare HMO | Attending: Psychiatry | Admitting: Psychiatry

## 2021-08-01 DIAGNOSIS — E669 Obesity, unspecified: Secondary | ICD-10-CM | POA: Insufficient documentation

## 2021-08-01 DIAGNOSIS — R7303 Prediabetes: Secondary | ICD-10-CM | POA: Insufficient documentation

## 2021-08-01 DIAGNOSIS — F39 Unspecified mood [affective] disorder: Secondary | ICD-10-CM | POA: Diagnosis not present

## 2021-08-01 DIAGNOSIS — Z6841 Body Mass Index (BMI) 40.0 and over, adult: Secondary | ICD-10-CM | POA: Insufficient documentation

## 2021-08-01 LAB — CBC WITH DIFFERENTIAL/PLATELET
Abs Immature Granulocytes: 0.01 10*3/uL (ref 0.00–0.07)
Basophils Absolute: 0 10*3/uL (ref 0.0–0.1)
Basophils Relative: 0 %
Eosinophils Absolute: 0 10*3/uL (ref 0.0–0.5)
Eosinophils Relative: 0 %
HCT: 36.7 % (ref 36.0–46.0)
Hemoglobin: 12.2 g/dL (ref 12.0–15.0)
Immature Granulocytes: 0 %
Lymphocytes Relative: 40 %
Lymphs Abs: 2.5 10*3/uL (ref 0.7–4.0)
MCH: 28.7 pg (ref 26.0–34.0)
MCHC: 33.2 g/dL (ref 30.0–36.0)
MCV: 86.4 fL (ref 80.0–100.0)
Monocytes Absolute: 0.4 10*3/uL (ref 0.1–1.0)
Monocytes Relative: 7 %
Neutro Abs: 3.3 10*3/uL (ref 1.7–7.7)
Neutrophils Relative %: 53 %
Platelets: 266 10*3/uL (ref 150–400)
RBC: 4.25 MIL/uL (ref 3.87–5.11)
RDW: 17.8 % — ABNORMAL HIGH (ref 11.5–15.5)
WBC: 6.2 10*3/uL (ref 4.0–10.5)
nRBC: 0 % (ref 0.0–0.2)

## 2021-08-06 ENCOUNTER — Encounter: Payer: Self-pay | Admitting: Internal Medicine

## 2021-08-06 ENCOUNTER — Ambulatory Visit (INDEPENDENT_AMBULATORY_CARE_PROVIDER_SITE_OTHER): Payer: Medicare HMO | Admitting: Internal Medicine

## 2021-08-06 DIAGNOSIS — E559 Vitamin D deficiency, unspecified: Secondary | ICD-10-CM | POA: Diagnosis not present

## 2021-08-06 DIAGNOSIS — Z6841 Body Mass Index (BMI) 40.0 and over, adult: Secondary | ICD-10-CM | POA: Diagnosis not present

## 2021-08-06 DIAGNOSIS — G8929 Other chronic pain: Secondary | ICD-10-CM

## 2021-08-06 DIAGNOSIS — E538 Deficiency of other specified B group vitamins: Secondary | ICD-10-CM | POA: Diagnosis not present

## 2021-08-06 DIAGNOSIS — M25562 Pain in left knee: Secondary | ICD-10-CM | POA: Diagnosis not present

## 2021-08-06 DIAGNOSIS — R5383 Other fatigue: Secondary | ICD-10-CM

## 2021-08-06 DIAGNOSIS — R7303 Prediabetes: Secondary | ICD-10-CM

## 2021-08-06 DIAGNOSIS — F3289 Other specified depressive episodes: Secondary | ICD-10-CM

## 2021-08-06 MED ORDER — TOPIRAMATE 25 MG PO TABS: 25.0000 mg | ORAL_TABLET | Freq: Two times a day (BID) | ORAL | 5 refills | Status: DC

## 2021-08-06 NOTE — Assessment & Plan Note (Signed)
CFS - multifactorial.  

## 2021-08-06 NOTE — Assessment & Plan Note (Signed)
On B complex 

## 2021-08-06 NOTE — Assessment & Plan Note (Signed)
Cont to f/u w/[facility]- S. Tickerhoff, NP Lab Results  Component Value Date   HGBA1C 5.6 06/28/2021

## 2021-08-06 NOTE — Assessment & Plan Note (Signed)
On Vit D 

## 2021-08-06 NOTE — Assessment & Plan Note (Signed)
F/u w/Dr [name]

## 2021-08-06 NOTE — Assessment & Plan Note (Addendum)
  Cont to f/u w/[facility]- S. Tickerhoff, NP Worse I renewed Topamax. Jameson will run it by her other doctors  Wt Readings from Last 3 Encounters:  08/06/21 291 lb (132 kg)  07/30/21 285 lb (129.3 kg)  07/02/21 279 lb (126.6 kg)

## 2021-08-06 NOTE — Progress Notes (Signed)
Subjective:  Patient ID: Brenda Hernandez, female    DOB: 07/30/1975  Age: 46 y.o. MRN: [id number]  CC: Follow-up and Asthma   HPI Zoiee Feltz presents for knee pain,B12 def, obesity - worse. Candace wants tore-start/take Topamax.   Outpatient Medications Prior to Visit  Medication Sig Dispense Refill   B Complex-Folic Acid (B COMPLEX PLUS) TABS Take 1 tablet by mouth daily. 100 tablet 5   clomiPRAMINE (ANAFRANIL) 75 MG capsule Take 75 mg by mouth 2 (two) times daily.     cloZAPine (CLOZARIL) 100 MG tablet Take 500 mg by mouth daily.     ibuprofen (ADVIL) 400 MG tablet Take 1 tablet (400 mg total) by mouth every 8 (eight) hours as needed for moderate pain. 90 tablet 2   levonorgestrel-ethinyl estradiol (ALESSE) 0.1-20 MG-MCG tablet Sronyx 0.1 mg-20 mcg tablet  TAKE 1 TABLET BY MOUTH EVERY DAY     metFORMIN (GLUCOPHAGE) 1000 MG tablet Take 1,000 mg by mouth 2 (two) times daily with a meal.     valproic acid (DEPAKENE) 250 MG/5ML solution Take 250 mg by mouth at bedtime.     topiramate (TOPAMAX) 25 MG tablet Take 1 tablet (25 mg total) by mouth at bedtime. 30 tablet 0   No facility-administered medications prior to visit.    ROS: Review of Systems  Constitutional:  Positive for fatigue and unexpected weight change. Negative for activity change, appetite change and chills.  HENT:  Negative for congestion, mouth sores and sinus pressure.   Eyes:  Negative for visual disturbance.  Respiratory:  Negative for cough and chest tightness.   Gastrointestinal:  Negative for abdominal pain and nausea.  Genitourinary:  Negative for difficulty urinating, frequency and vaginal pain.  Musculoskeletal:  Positive for arthralgias and gait problem. Negative for back pain.  Skin:  Negative for pallor and rash.  Neurological:  Negative for dizziness, tremors, weakness, numbness and headaches.  Psychiatric/Behavioral:  Negative for confusion, sleep disturbance and suicidal ideas. The patient is  nervous/anxious.    Objective:  BP 138/80   Pulse 96   Temp 98.5 F (36.9 C) (Oral)   Ht '5\' 3"'$  (1.6 m)   Wt 291 lb (132 kg)   SpO2 99%   BMI 51.55 kg/m   BP Readings from Last 3 Encounters:  08/06/21 138/80  07/30/21 132/89  07/02/21 113/83    Wt Readings from Last 3 Encounters:  08/06/21 291 lb (132 kg)  07/30/21 285 lb (129.3 kg)  07/02/21 279 lb (126.6 kg)    Physical Exam Constitutional:      General: She is not in acute distress.    Appearance: She is well-developed. She is obese.  HENT:     Head: Normocephalic.     Right Ear: External ear normal.     Left Ear: External ear normal.     Nose: Nose normal.  Eyes:     General:        Right eye: No discharge.        Left eye: No discharge.     Conjunctiva/sclera: Conjunctivae normal.     Pupils: Pupils are equal, round, and reactive to light.  Neck:     Thyroid: No thyromegaly.     Vascular: No JVD.     Trachea: No tracheal deviation.  Cardiovascular:     Rate and Rhythm: Normal rate and regular rhythm.     Heart sounds: Normal heart sounds.  Pulmonary:     Effort: No respiratory distress.     Breath  sounds: No stridor. No wheezing.  Abdominal:     General: Bowel sounds are normal. There is no distension.     Palpations: Abdomen is soft. There is no mass.     Tenderness: There is no abdominal tenderness. There is no guarding or rebound.  Musculoskeletal:        General: Tenderness present.     Cervical back: Normal range of motion and neck supple. No rigidity.  Lymphadenopathy:     Cervical: No cervical adenopathy.  Skin:    Findings: No erythema or rash.  Neurological:     Mental Status: She is oriented to person, place, and time.     Cranial Nerves: No cranial nerve deficit.     Motor: No abnormal muscle tone.     Coordination: Coordination normal.     Gait: Gait abnormal.     Deep Tendon Reflexes: Reflexes normal.  Psychiatric:        Behavior: Behavior normal.        Thought Content: Thought  content normal.        Judgment: Judgment normal.  Using a walker L knee w/pain  Lab Results  Component Value Date   WBC 6.2 08/01/2021   HGB 12.2 08/01/2021   HCT 36.7 08/01/2021   PLT 266 08/01/2021   GLUCOSE 95 06/28/2021   CHOL 153 06/28/2021   TRIG 86 06/28/2021   HDL 62 06/28/2021   LDLCALC 74 06/28/2021   ALT 14 06/28/2021   AST 17 06/28/2021   NA 138 06/28/2021   K 4.0 06/28/2021   CL 105 06/28/2021   CREATININE 0.90 06/28/2021   BUN 8 06/28/2021   CO2 27 06/28/2021   TSH 2.379 12/28/2020   HGBA1C 5.6 06/28/2021    No results found.  Assessment & Plan:   Problem List Items Addressed This Visit     Pre-diabetes    Cont to f/u w/[facility]- S. Tickerhoff, NP Lab Results  Component Value Date   HGBA1C 5.6 06/28/2021        Vitamin D deficiency    On Vit D       Low serum vitamin B12    On B complex       Knee pain, left    F/u w/Dr [name]      Class 3 severe obesity with serious comorbidity and body mass index (BMI) of 50.0 to 59.9 in adult [facility])     Cont to f/u w/[facility]- S. Tickerhoff, NP Worse I renewed Topamax. Delanna will run it by her other doctors  Wt Readings from Last 3 Encounters:  08/06/21 291 lb (132 kg)  07/30/21 285 lb (129.3 kg)  07/02/21 279 lb (126.6 kg)        Fatigue    CFS - multifactorial       Other Visit Diagnoses     Other depression with emotional eating       Relevant Medications   topiramate (TOPAMAX) 25 MG tablet         Meds ordered this encounter  Medications   topiramate (TOPAMAX) 25 MG tablet    Sig: Take 1 tablet (25 mg total) by mouth 2 (two) times daily.    Dispense:  60 tablet    Refill:  5      Follow-up: No follow-ups on file.  Walker Kehr, MD

## 2021-08-13 ENCOUNTER — Encounter (INDEPENDENT_AMBULATORY_CARE_PROVIDER_SITE_OTHER): Payer: Self-pay | Admitting: Family Medicine

## 2021-08-13 DIAGNOSIS — F429 Obsessive-compulsive disorder, unspecified: Secondary | ICD-10-CM | POA: Diagnosis not present

## 2021-08-13 DIAGNOSIS — F319 Bipolar disorder, unspecified: Secondary | ICD-10-CM | POA: Diagnosis not present

## 2021-08-13 NOTE — Telephone Encounter (Signed)
Dr.Beasley 

## 2021-08-14 NOTE — Telephone Encounter (Signed)
Patient calling back in about message below. Patient would like a call back at [phone].

## 2021-08-14 NOTE — Telephone Encounter (Signed)
Spoke with patient on the phone after consulting with Dr. Leafy Ro.  Advised patient that these medicines should not be taken together.  She should continue the valproic acid as prescribed by her psychiatrist and discontinue the topiramate.

## 2021-08-25 DIAGNOSIS — E119 Type 2 diabetes mellitus without complications: Secondary | ICD-10-CM | POA: Diagnosis not present

## 2021-08-25 DIAGNOSIS — Z0100 Encounter for examination of eyes and vision without abnormal findings: Secondary | ICD-10-CM | POA: Diagnosis not present

## 2021-08-27 ENCOUNTER — Encounter (INDEPENDENT_AMBULATORY_CARE_PROVIDER_SITE_OTHER): Payer: Self-pay | Admitting: Nurse Practitioner

## 2021-08-27 ENCOUNTER — Ambulatory Visit (INDEPENDENT_AMBULATORY_CARE_PROVIDER_SITE_OTHER): Payer: Medicare HMO | Admitting: Nurse Practitioner

## 2021-08-27 VITALS — BP 129/88 | HR 96 | Temp 97.4°F | Ht 63.0 in | Wt 287.0 lb

## 2021-08-27 DIAGNOSIS — F39 Unspecified mood [affective] disorder: Secondary | ICD-10-CM | POA: Diagnosis not present

## 2021-08-27 DIAGNOSIS — E669 Obesity, unspecified: Secondary | ICD-10-CM

## 2021-08-27 DIAGNOSIS — Z6841 Body Mass Index (BMI) 40.0 and over, adult: Secondary | ICD-10-CM | POA: Diagnosis not present

## 2021-08-28 NOTE — Progress Notes (Signed)
Chief Complaint:   OBESITY Brenda Hernandez is here to discuss her progress with her obesity treatment plan along with follow-up of her obesity related diagnoses. Brenda Hernandez is on the Category 2 Plan and states she is following her eating plan approximately 45% of the time. Brenda Hernandez states she is doing strength training 10-15 minutes 7 times per week.  Today's visit was #: 4 Starting weight: 287 lbs Starting date: 03/26/2020 Today's weight: 287 lbs Today's date: 08/27/2021 Total lbs lost to date: 0 lbs Total lbs lost since last in-office visit: 0  Interim History: Brenda Hernandez reports she is doing better then she has been overall doing. Feels that she is making healthier choices and wataching her portion size. She drinks OJ, Apple juice and Sparkling water. Notes that she need to drink more water.  Subjective:   1. 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 She is taking Depakene 250 mg prescribed by her  psychiatrist since 2001. She has been interested in starting Topamax but I did not prescribe it since she is taking Depakene. I've asked her to discuss with her psychiatrist. She recently saw her PCP and was prescribed Topamax which she is not currently taking.  Assessment/Plan:   1. 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  She is interested in starting Topamax.    2. Obesity with current BMI 50.9 Brenda Hernandez is currently in the action stage of change. As such, her goal is to continue with weight loss efforts. She has agreed to the Category 2 Plan.   Exercise goals: All adults should avoid inactivity. Some physical activity is better than none, and adults who participate in any amount of physical activity gain some health benefits.  Brenda Hernandez does have Silver Sneakers but can not go because she does not drive. Would benefit from pool  therapy.  Encouraged to track using MyFitness Pal, weigh food and measure out portions.   Behavioral modification strategies: increasing water intake, planning for success, and keeping a strict food journal.  Brenda Hernandez has agreed to follow-up with our clinic in 4 weeks. She was informed of the importance of frequent follow-up visits to maximize her success with intensive lifestyle modifications for her multiple health conditions.   Objective:   Blood pressure 129/88, pulse 96, temperature (!) 97.4 F (36.3 C), height '5\' 3"'$  (1.6 m), weight 287 lb (130.2 kg), SpO2 100 %. Body mass index is 50.84 kg/m.  General: Cooperative, alert, well developed, in no acute distress. HEENT: Conjunctivae and lids unremarkable. Cardiovascular: Regular rhythm.  Lungs: Normal work of breathing. Neurologic: No focal deficits.   Lab Results  Component Value Date   CREATININE 0.90 06/28/2021   BUN 8 06/28/2021   NA 138 06/28/2021   K 4.0 06/28/2021   CL 105 06/28/2021   CO2 27 06/28/2021   Lab Results  Component Value Date   ALT 14 06/28/2021   AST 17 06/28/2021   ALKPHOS 64 06/28/2021   BILITOT 0.6 06/28/2021   Lab Results  Component Value Date   HGBA1C 5.6 06/28/2021   HGBA1C 5.6 06/18/2021   HGBA1C 5.5 12/28/2020   HGBA1C 5.5 12/05/2020   HGBA1C 5.7 (H) 08/03/2020   Lab Results  Component Value Date   INSULIN 14.1 06/18/2021   INSULIN 6.0 12/05/2020   INSULIN 10.8 07/18/2020   INSULIN 24.7 03/26/2020   Lab Results  Component Value Date  TSH 2.379 12/28/2020   Lab Results  Component Value Date   CHOL 153 06/28/2021   HDL 62 06/28/2021   LDLCALC 74 06/28/2021   TRIG 86 06/28/2021   CHOLHDL 2.5 06/28/2021   Lab Results  Component Value Date   VD25OH 52.44 12/28/2020   VD25OH 46.8 12/05/2020   VD25OH 91.02 08/03/2020   Lab Results  Component Value Date   WBC 6.2 08/01/2021   HGB 12.2 08/01/2021   HCT 36.7 08/01/2021   MCV 86.4 08/01/2021   PLT 266 08/01/2021   No  results found for: "IRON", "TIBC", "FERRITIN"  Attestation Statements:   Reviewed by clinician on day of visit: allergies, medications, problem list, medical history, surgical history, family history, social history, and previous encounter notes.  Time spent on visit including pre-visit chart review and post-visit care and charting was 30 minutes.   I, Brendell Tyus, RMA, am acting as transcriptionist for Everardo Pacific, FNP..  I have reviewed the above documentation for accuracy and completeness, and I agree with the above. Everardo Pacific, FNP

## 2021-08-29 ENCOUNTER — Other Ambulatory Visit (HOSPITAL_COMMUNITY)
Admission: RE | Admit: 2021-08-29 | Discharge: 2021-08-29 | Disposition: A | Discharge status: Home / Self Care | Payer: Medicare HMO | Attending: Psychiatry | Admitting: Psychiatry

## 2021-08-29 DIAGNOSIS — F259 Schizoaffective disorder, unspecified: Secondary | ICD-10-CM | POA: Insufficient documentation

## 2021-08-29 DIAGNOSIS — Z79899 Other long term (current) drug therapy: Secondary | ICD-10-CM | POA: Diagnosis not present

## 2021-08-29 LAB — CBC WITH DIFFERENTIAL/PLATELET
Abs Immature Granulocytes: 0.03 10*3/uL (ref 0.00–0.07)
Basophils Absolute: 0 10*3/uL (ref 0.0–0.1)
Basophils Relative: 0 %
Eosinophils Absolute: 0 10*3/uL (ref 0.0–0.5)
Eosinophils Relative: 0 %
HCT: 35.1 % — ABNORMAL LOW (ref 36.0–46.0)
Hemoglobin: 12 g/dL (ref 12.0–15.0)
Immature Granulocytes: 0 %
Lymphocytes Relative: 43 %
Lymphs Abs: 3.3 10*3/uL (ref 0.7–4.0)
MCH: 29.4 pg (ref 26.0–34.0)
MCHC: 34.2 g/dL (ref 30.0–36.0)
MCV: 86 fL (ref 80.0–100.0)
Monocytes Absolute: 0.4 10*3/uL (ref 0.1–1.0)
Monocytes Relative: 5 %
Neutro Abs: 3.9 10*3/uL (ref 1.7–7.7)
Neutrophils Relative %: 52 %
Platelets: 279 10*3/uL (ref 150–400)
RBC: 4.08 MIL/uL (ref 3.87–5.11)
RDW: 18.1 % — ABNORMAL HIGH (ref 11.5–15.5)
WBC: 7.6 10*3/uL (ref 4.0–10.5)
nRBC: 0 % (ref 0.0–0.2)

## 2021-09-10 DIAGNOSIS — F319 Bipolar disorder, unspecified: Secondary | ICD-10-CM | POA: Diagnosis not present

## 2021-09-10 DIAGNOSIS — F429 Obsessive-compulsive disorder, unspecified: Secondary | ICD-10-CM | POA: Diagnosis not present

## 2021-09-18 DIAGNOSIS — F319 Bipolar disorder, unspecified: Secondary | ICD-10-CM | POA: Diagnosis not present

## 2021-09-24 ENCOUNTER — Ambulatory Visit (INDEPENDENT_AMBULATORY_CARE_PROVIDER_SITE_OTHER): Payer: Medicare HMO | Admitting: Nurse Practitioner

## 2021-09-24 ENCOUNTER — Encounter (INDEPENDENT_AMBULATORY_CARE_PROVIDER_SITE_OTHER): Payer: Self-pay | Admitting: Nurse Practitioner

## 2021-09-24 VITALS — BP 119/88 | HR 100 | Temp 98.0°F | Ht 63.0 in | Wt 286.0 lb

## 2021-09-24 DIAGNOSIS — Z6841 Body Mass Index (BMI) 40.0 and over, adult: Secondary | ICD-10-CM | POA: Diagnosis not present

## 2021-09-24 DIAGNOSIS — R7303 Prediabetes: Secondary | ICD-10-CM

## 2021-09-24 DIAGNOSIS — E669 Obesity, unspecified: Secondary | ICD-10-CM | POA: Diagnosis not present

## 2021-09-25 DIAGNOSIS — F319 Bipolar disorder, unspecified: Secondary | ICD-10-CM | POA: Diagnosis not present

## 2021-09-25 NOTE — Progress Notes (Signed)
Chief Complaint:   OBESITY Brenda Hernandez is here to discuss her progress with her obesity treatment plan along with follow-up of her obesity related diagnoses. Brenda Hernandez is on the Category 2 Plan and states she is following her eating plan approximately 40% of the time. Brenda Hernandez states she is exercising 0 minutes 0 times per week.  Today's visit was #: 52 Starting weight: 287 lbs Starting date: 03/26/2020 Today's weight: 286 lbs Today's date: 09/24/2021 Total lbs lost to date: 1 lb Total lbs lost since last in-office visit: 1  Interim History: Brenda Hernandez has stopped cereal, biscuits, and waffles since her last visit. She is skipping breakfast. She is eating 1 meal + 1 to 2 snacks per day. Snacks are chips and dip, drinking juice or water. Brenda Hernandez does not drive.  Her mother is taking her to [facility], [place], etc.  She is concerned about what Brenda Hernandez is eating and is trying to help her make better choices because she is concerned about her having diabetes.  She is working on limiting sweets.  Subjective:   1. Prediabetes Brenda Hernandez is currently taking Metformin 1000 mg twice a day, prescribed by Dr. Barbie Banner. Denies any side effects. Reports polyphagia and some cravings.   Assessment/Plan:   1. Prediabetes Continue to follow up with Dr. Barbie Banner. [name] will continue to work on weight loss, exercise, and decreasing simple carbohydrates to help decrease the risk of diabetes.   2. Obesity with current BMI 50.7 I had a long discussion with Brenda Hernandez about drinking water and not skipping meals, eating protein and increasing water intake. She needs to stop eating out, fast food restaurants as frequently as she is.   Brenda Hernandez is currently in the action stage of change. As such, her goal is to continue with weight loss efforts. She has agreed to the Category 2 Plan.   Exercise goals: All adults should avoid inactivity. Some physical activity is better than none, and adults who participate in any amount  of physical activity gain some health benefits.  Chair exercises.  Behavioral modification strategies: increasing lean protein intake, increasing water intake, and planning for success.  Brenda Hernandez has agreed to follow-up with our clinic in 4 weeks. She was informed of the importance of frequent follow-up visits to maximize her success with intensive lifestyle modifications for her multiple health conditions.   Objective:   Blood pressure 119/88, pulse 100, temperature 98 F (36.7 C), height '5\' 3"'$  (1.6 m), weight 286 lb (129.7 kg), SpO2 98 %. Body mass index is 50.66 kg/m.  General: Cooperative, alert, well developed, in no acute distress. HEENT: Conjunctivae and lids unremarkable. Cardiovascular: Regular rhythm.  Lungs: Normal work of breathing. Neurologic: No focal deficits.   Lab Results  Component Value Date   CREATININE 0.90 06/28/2021   BUN 8 06/28/2021   NA 138 06/28/2021   K 4.0 06/28/2021   CL 105 06/28/2021   CO2 27 06/28/2021   Lab Results  Component Value Date   ALT 14 06/28/2021   AST 17 06/28/2021   ALKPHOS 64 06/28/2021   BILITOT 0.6 06/28/2021   Lab Results  Component Value Date   HGBA1C 5.6 06/28/2021   HGBA1C 5.6 06/18/2021   HGBA1C 5.5 12/28/2020   HGBA1C 5.5 12/05/2020   HGBA1C 5.7 (H) 08/03/2020   Lab Results  Component Value Date   INSULIN 14.1 06/18/2021   INSULIN 6.0 12/05/2020   INSULIN 10.8 07/18/2020   INSULIN 24.7 03/26/2020   Lab Results  Component Value Date  TSH 2.379 12/28/2020   Lab Results  Component Value Date   CHOL 153 06/28/2021   HDL 62 06/28/2021   LDLCALC 74 06/28/2021   TRIG 86 06/28/2021   CHOLHDL 2.5 06/28/2021   Lab Results  Component Value Date   VD25OH 52.44 12/28/2020   VD25OH 46.8 12/05/2020   VD25OH 91.02 08/03/2020   Lab Results  Component Value Date   WBC 7.6 08/29/2021   HGB 12.0 08/29/2021   HCT 35.1 (L) 08/29/2021   MCV 86.0 08/29/2021   PLT 279 08/29/2021   No results found for: "IRON",  "TIBC", "FERRITIN"  Attestation Statements:   Reviewed by clinician on day of visit: allergies, medications, problem list, medical history, surgical history, family history, social history, and previous encounter notes.  Time spent on visit including pre-visit chart review and post-visit care and charting was 30 minutes.   I, Brendell Tyus, RMA, am acting as transcriptionist for Everardo Pacific, FNP.  I have reviewed the above documentation for accuracy and completeness, and I agree with the above. Everardo Pacific, FNP

## 2021-09-27 ENCOUNTER — Other Ambulatory Visit (HOSPITAL_COMMUNITY)
Admission: AD | Admit: 2021-09-27 | Discharge: 2021-09-27 | Disposition: A | Discharge status: Home / Self Care | Payer: Medicare HMO | Source: Ambulatory Visit | Attending: Internal Medicine | Admitting: Internal Medicine

## 2021-09-27 DIAGNOSIS — Z79899 Other long term (current) drug therapy: Secondary | ICD-10-CM | POA: Diagnosis not present

## 2021-09-27 LAB — CBC WITH DIFFERENTIAL/PLATELET
Abs Immature Granulocytes: 0.03 10*3/uL (ref 0.00–0.07)
Basophils Absolute: 0 10*3/uL (ref 0.0–0.1)
Basophils Relative: 0 %
Eosinophils Absolute: 0 10*3/uL (ref 0.0–0.5)
Eosinophils Relative: 0 %
HCT: 37.8 % (ref 36.0–46.0)
Hemoglobin: 13.1 g/dL (ref 12.0–15.0)
Immature Granulocytes: 0 %
Lymphocytes Relative: 44 %
Lymphs Abs: 3.6 10*3/uL (ref 0.7–4.0)
MCH: 29.5 pg (ref 26.0–34.0)
MCHC: 34.7 g/dL (ref 30.0–36.0)
MCV: 85.1 fL (ref 80.0–100.0)
Monocytes Absolute: 0.5 10*3/uL (ref 0.1–1.0)
Monocytes Relative: 6 %
Neutro Abs: 4.1 10*3/uL (ref 1.7–7.7)
Neutrophils Relative %: 50 %
Platelets: 272 10*3/uL (ref 150–400)
RBC: 4.44 MIL/uL (ref 3.87–5.11)
RDW: 17.6 % — ABNORMAL HIGH (ref 11.5–15.5)
WBC: 8.1 10*3/uL (ref 4.0–10.5)
nRBC: 0 % (ref 0.0–0.2)

## 2021-10-02 DIAGNOSIS — F319 Bipolar disorder, unspecified: Secondary | ICD-10-CM | POA: Diagnosis not present

## 2021-10-02 DIAGNOSIS — F429 Obsessive-compulsive disorder, unspecified: Secondary | ICD-10-CM | POA: Diagnosis not present

## 2021-10-06 DIAGNOSIS — U071 COVID-19: Secondary | ICD-10-CM | POA: Diagnosis not present

## 2021-10-06 DIAGNOSIS — B349 Viral infection, unspecified: Secondary | ICD-10-CM | POA: Diagnosis not present

## 2021-10-07 ENCOUNTER — Telehealth: Payer: Self-pay | Admitting: Internal Medicine

## 2021-10-07 NOTE — Telephone Encounter (Signed)
MD not in the office until 10/13/21 pls advise on msg../lmb

## 2021-10-07 NOTE — Telephone Encounter (Signed)
I don't see that we saw her for covid-19. Depending on her symptoms, symptom start date and other health conditions she may not need to take an antiviral. I am happy to see her virtually to talk to her. I typically do not prescribe paxlovid for my patients due to high amount of interactions and side effects especially when compared with usual covid-19 symptoms currently.

## 2021-10-07 NOTE — Telephone Encounter (Signed)
Patient tested positive for covid - she was prescribed Paxlovid but was reading the literature that comes with it and found out that clozapine should not be taken with paxlovid.  Please call patient and advise her what to do about this.  She is very worried.

## 2021-10-07 NOTE — Telephone Encounter (Signed)
Notified pt w/ MD response. Pt states she went to physician @ [facility]. She unsure if she going to take. Gave her other counter med that Dr. Alain Marion recommend if she doesn't have symptoms (cough syrup, allergy meds, vitamin d, and zinc). If symptoms persist she may need to take paxolvid that was given.Marland KitchenJohny Chess

## 2021-10-08 ENCOUNTER — Ambulatory Visit (INDEPENDENT_AMBULATORY_CARE_PROVIDER_SITE_OTHER): Payer: Medicare HMO | Admitting: Nurse Practitioner

## 2021-10-08 ENCOUNTER — Telehealth: Payer: Self-pay | Admitting: Internal Medicine

## 2021-10-08 NOTE — Telephone Encounter (Signed)
Pt father called in and wants to know if Cathe Mons can be taken with current listg of medications.   State everyone in the home has COVID and has medications, except for the patient.   Requesting a call back to discuss options.

## 2021-10-08 NOTE — Telephone Encounter (Signed)
Lagrevrio or mulnupirvair does not have drug interactions

## 2021-10-08 NOTE — Telephone Encounter (Signed)
Pls advise on message in absence of PCP.Marland KitchenJohny Hernandez

## 2021-10-09 ENCOUNTER — Encounter: Payer: Self-pay | Admitting: Nurse Practitioner

## 2021-10-09 ENCOUNTER — Telehealth (INDEPENDENT_AMBULATORY_CARE_PROVIDER_SITE_OTHER): Payer: Medicare HMO | Admitting: Nurse Practitioner

## 2021-10-09 DIAGNOSIS — U071 COVID-19: Secondary | ICD-10-CM | POA: Diagnosis not present

## 2021-10-09 MED ORDER — FLUTICASONE PROPIONATE 50 MCG/ACT NA SUSP: 1.0000 | Freq: Every day | NASAL | 6 refills | Status: AC

## 2021-10-09 MED ORDER — MOLNUPIRAVIR EUA 200MG CAPSULE: 4.0000 | ORAL_CAPSULE | Freq: Two times a day (BID) | ORAL | 0 refills | Status: AC

## 2021-10-09 NOTE — Telephone Encounter (Signed)
Pt's father has called again to ask about the RX for covid.   Please send RX to [id number] [place], [place] [address] Has again requested we call them back once the RX has been sent off.

## 2021-10-09 NOTE — Telephone Encounter (Signed)
Notified pt w/MD response.../lmb 

## 2021-10-09 NOTE — Telephone Encounter (Signed)
Patient's father called back and requests that medication be sent to pharmacy since it is safe with her current list of medications.  [id number] [place], [place] - [address] Father requests call back when medication has been called in.

## 2021-10-09 NOTE — Progress Notes (Signed)
   Established Patient Office Visit  An audio-only tele-health visit was completed today for this patient. I connected with  Brenda Hernandez on 10/09/21 utilizing audio-only technology and verified that I am speaking with the correct person using two identifiers. The patient was located at their home, and I was located at the office of [place] at [facility] [address] during the encounter. I discussed the limitations of evaluation and management by telemedicine. The patient expressed understanding and agreed to proceed.   Her father Brenda Hernandez was available to assist in the video visit today.    Subjective   Patient ID: Brenda Hernandez, female    DOB: 01-Mar-1976  Age: 46 y.o. MRN: [id number]  Chief Complaint  Patient presents with   Covid Positive    Patient arrives today for virtual visit for the above. She reports symptoms initially started on 10/04/21. She is feeling improved today. She continues to have congestion and cough, but denies SOB. She was originally prescribed paxlovid but never took due to her having concerns with possible medication interactions. She arrives today to discuss a different treatment option. She reports covid 19 vaccine x 5.     Review of Systems  Constitutional:  Negative for chills and fever.  HENT:  Positive for congestion.   Respiratory:  Positive for cough. Negative for shortness of breath.   Cardiovascular:  Negative for chest pain.  Gastrointestinal:  Negative for diarrhea, nausea and vomiting.  Neurological:  Positive for dizziness (much improved).      Objective:     There were no vitals taken for this visit.   Physical Exam Comprehensive physical exam not completed today as office visit was conducted remotely.  Patient appears well over phone.  Patient was alert and oriented, and appeared to have appropriate judgment.   No results found for any visits on 10/09/21.    The 10-year ASCVD risk score (Arnett DK, et al., 2019) is: 1.3%     Assessment & Plan:  1.  We discussed possibly not treating as patient reports symptoms are improving.  However patient and father would like to take antiviral medication aimed at preventing serious infection.  Due to concerns of medication interactions will prescribe molnupiravir that she will take twice a day for 5 days.  Patient encouraged to quarantine through the end of today and can consider returning to the community starting tomorrow if symptoms have improved but to wear a mask until at least 10 days after symptoms started.  Patient reports her understanding.  Also prescribed Flonase nasal spray for nasal congestion.  Patient warned to call [phone] if she experiences elevated temperature, shortness of breath, severe coughing, severe fatigue.  They report understanding. Problem List Items Addressed This Visit   None Visit Diagnoses     COVID-19    -  Primary   Relevant Medications   molnupiravir EUA (LAGEVRIO) 200 mg CAPS capsule   fluticasone (FLONASE) 50 MCG/ACT nasal spray       Return if symptoms worsen or fail to improve.    Brenda Ards, NP

## 2021-10-09 NOTE — Telephone Encounter (Signed)
Call pt spoke w/ father inform him of the conversation tht was given this morning y Dr. Quay Burow since MD was out of the office. He wanted somebody to write the Lagrevrio rx now. Inform him on yesterday we told [name] she will need to have virtual w/ one of our providers if she want script. Did not want at that time. Did give her recommendation of what she can take over the counter what Dr. Camila Li recommends. Father would like virtual.. Made appt w/ Judson Roch, NP.Marland KitchenJohny Hernandez

## 2021-10-15 ENCOUNTER — Ambulatory Visit (INDEPENDENT_AMBULATORY_CARE_PROVIDER_SITE_OTHER): Payer: Medicare HMO | Admitting: Adult Health

## 2021-10-22 ENCOUNTER — Encounter (INDEPENDENT_AMBULATORY_CARE_PROVIDER_SITE_OTHER): Payer: Self-pay

## 2021-10-29 DIAGNOSIS — F429 Obsessive-compulsive disorder, unspecified: Secondary | ICD-10-CM | POA: Diagnosis not present

## 2021-10-29 DIAGNOSIS — F319 Bipolar disorder, unspecified: Secondary | ICD-10-CM | POA: Diagnosis not present

## 2021-10-30 DIAGNOSIS — F319 Bipolar disorder, unspecified: Secondary | ICD-10-CM | POA: Diagnosis not present

## 2021-10-31 ENCOUNTER — Ambulatory Visit (INDEPENDENT_AMBULATORY_CARE_PROVIDER_SITE_OTHER): Payer: Medicare HMO

## 2021-10-31 DIAGNOSIS — Z0000 Encounter for general adult medical examination without abnormal findings: Secondary | ICD-10-CM | POA: Diagnosis not present

## 2021-10-31 NOTE — Patient Instructions (Signed)
Brenda Hernandez , Thank you for taking time to come for your Medicare Wellness Visit. I appreciate your ongoing commitment to your health goals. Please review the following plan we discussed and let me know if I can assist you in the future.   Screening recommendations/referrals: Colonoscopy: Never done Mammogram: 12/2020; need records from [place]: Never done Recommended yearly ophthalmology/optometry visit for glaucoma screening and checkup Recommended yearly dental visit for hygiene and checkup  Vaccinations: Influenza vaccine: 12/24/2020 Pneumococcal vaccine: never done (due at age 84) Tdap vaccine: 05/14/2017; due every 10 years Shingles vaccine: never done (due at age 36)  Covid-19: 05/27/2019, 06/17/2019  Advanced directives: No; Advance directive discussed with you today. I have provided a copy for you to complete at home and have notarized. Once this is complete please bring a copy in to our office so we can scan it into your chart.  Conditions/risks identified: Yes  Next appointment: Please schedule your next Medicare Wellness Visit with your Nurse Health Advisor in 1 year by calling [phone].  Preventive Care 40-64 Years, Female Preventive care refers to lifestyle choices and visits with your health care provider that can promote health and wellness. What does preventive care include? A yearly physical exam. This is also called an annual well check. Dental exams once or twice a year. Routine eye exams. Ask your health care provider how often you should have your eyes checked. Personal lifestyle choices, including: Daily care of your teeth and gums. Regular physical activity. Eating a healthy diet. Avoiding tobacco and drug use. Limiting alcohol use. Practicing safe sex. Taking low-dose aspirin daily starting at age 16. Taking vitamin and mineral supplements as recommended by your health care provider. What happens during an annual well  check? The services and screenings done by your health care provider during your annual well check will depend on your age, overall health, lifestyle risk factors, and family history of disease. Counseling  Your health care provider may ask you questions about your: Alcohol use. Tobacco use. Drug use. Emotional well-being. Home and relationship well-being. Sexual activity. Eating habits. Work and work Statistician. Method of birth control. Menstrual cycle. Pregnancy history. Screening  You may have the following tests or measurements: Height, weight, and BMI. Blood pressure. Lipid and cholesterol levels. These may be checked every 5 years, or more frequently if you are over 21 years old. Skin check. Lung cancer screening. You may have this screening every year starting at age 17 if you have a 30-pack-year history of smoking and currently smoke or have quit within the past 15 years. Fecal occult blood test (FOBT) of the stool. You may have this test every year starting at age 85. Flexible sigmoidoscopy or colonoscopy. You may have a sigmoidoscopy every 5 years or a colonoscopy every 10 years starting at age 3. Hepatitis C blood test. Hepatitis B blood test. Sexually transmitted disease (STD) testing. Diabetes screening. This is done by checking your blood sugar (glucose) after you have not eaten for a while (fasting). You may have this done every 1-3 years. Mammogram. This may be done every 1-2 years. Talk to your health care provider about when you should start having regular mammograms. This may depend on whether you have a family history of breast cancer. BRCA-related cancer screening. This may be done if you have a family history of breast, ovarian, tubal, or peritoneal cancers. Pelvic exam and Pap test. This may be done every 3 years starting at age 81. Starting at age  30, this may be done every 5 years if you have a Pap test in combination with an HPV test. Bone density scan. This  is done to screen for osteoporosis. You may have this scan if you are at high risk for osteoporosis. Discuss your test results, treatment options, and if necessary, the need for more tests with your health care provider. Vaccines  Your health care provider may recommend certain vaccines, such as: Influenza vaccine. This is recommended every year. Tetanus, diphtheria, and acellular pertussis (Tdap, Td) vaccine. You may need a Td booster every 10 years. Zoster vaccine. You may need this after age 72. Pneumococcal 13-valent conjugate (PCV13) vaccine. You may need this if you have certain conditions and were not previously vaccinated. Pneumococcal polysaccharide (PPSV23) vaccine. You may need one or two doses if you smoke cigarettes or if you have certain conditions. Talk to your health care provider about which screenings and vaccines you need and how often you need them. This information is not intended to replace advice given to you by your health care provider. Make sure you discuss any questions you have with your health care provider. Document Released: 03/29/2015 Document Revised: 11/20/2015 Document Reviewed: 01/01/2015 Elsevier Interactive Patient Education  2017 [place] Prevention in the Home Falls can cause injuries. They can happen to people of all ages. There are many things you can do to make your home safe and to help prevent falls. What can I do on the outside of my home? Regularly fix the edges of walkways and driveways and fix any cracks. Remove anything that might make you trip as you walk through a door, such as a raised step or threshold. Trim any bushes or trees on the path to your home. Use bright outdoor lighting. Clear any walking paths of anything that might make someone trip, such as rocks or tools. Regularly check to see if handrails are loose or broken. Make sure that both sides of any steps have handrails. Any raised decks and porches should have  guardrails on the edges. Have any leaves, snow, or ice cleared regularly. Use sand or salt on walking paths during winter. Clean up any spills in your garage right away. This includes oil or grease spills. What can I do in the bathroom? Use night lights. Install grab bars by the toilet and in the tub and shower. Do not use towel bars as grab bars. Use non-skid mats or decals in the tub or shower. If you need to sit down in the shower, use a plastic, non-slip stool. Keep the floor dry. Clean up any water that spills on the floor as soon as it happens. Remove soap buildup in the tub or shower regularly. Attach bath mats securely with double-sided non-slip rug tape. Do not have throw rugs and other things on the floor that can make you trip. What can I do in the bedroom? Use night lights. Make sure that you have a light by your bed that is easy to reach. Do not use any sheets or blankets that are too big for your bed. They should not hang down onto the floor. Have a firm chair that has side arms. You can use this for support while you get dressed. Do not have throw rugs and other things on the floor that can make you trip. What can I do in the kitchen? Clean up any spills right away. Avoid walking on wet floors. Keep items that you use a lot in  easy-to-reach places. If you need to reach something above you, use a strong step stool that has a grab bar. Keep electrical cords out of the way. Do not use floor polish or wax that makes floors slippery. If you must use wax, use non-skid floor wax. Do not have throw rugs and other things on the floor that can make you trip. What can I do with my stairs? Do not leave any items on the stairs. Make sure that there are handrails on both sides of the stairs and use them. Fix handrails that are broken or loose. Make sure that handrails are as long as the stairways. Check any carpeting to make sure that it is firmly attached to the stairs. Fix any carpet  that is loose or worn. Avoid having throw rugs at the top or bottom of the stairs. If you do have throw rugs, attach them to the floor with carpet tape. Make sure that you have a light switch at the top of the stairs and the bottom of the stairs. If you do not have them, ask someone to add them for you. What else can I do to help prevent falls? Wear shoes that: Do not have high heels. Have rubber bottoms. Are comfortable and fit you well. Are closed at the toe. Do not wear sandals. If you use a stepladder: Make sure that it is fully opened. Do not climb a closed stepladder. Make sure that both sides of the stepladder are locked into place. Ask someone to hold it for you, if possible. Clearly mark and make sure that you can see: Any grab bars or handrails. First and last steps. Where the edge of each step is. Use tools that help you move around (mobility aids) if they are needed. These include: Canes. Walkers. Scooters. Crutches. Turn on the lights when you go into a dark area. Replace any light bulbs as soon as they burn out. Set up your furniture so you have a clear path. Avoid moving your furniture around. If any of your floors are uneven, fix them. If there are any pets around you, be aware of where they are. Review your medicines with your doctor. Some medicines can make you feel dizzy. This can increase your chance of falling. Ask your doctor what other things that you can do to help prevent falls. This information is not intended to replace advice given to you by your health care provider. Make sure you discuss any questions you have with your health care provider. Document Released: 12/27/2008 Document Revised: 08/08/2015 Document Reviewed: 04/06/2014 Elsevier Interactive Patient Education  2017 [employer].

## 2021-10-31 NOTE — Progress Notes (Cosign Needed Addendum)
I connected with [name] today by telephone and verified that I am speaking with the correct person using two identifiers. Location patient: home Location provider: work Persons participating in the virtual visit: patient, provider.   I discussed the limitations, risks, security and privacy concerns of performing an evaluation and management service by telephone and the availability of in person appointments. I also discussed with the patient that there may be a patient responsible charge related to this service. The patient expressed understanding and verbally consented to this telephonic visit.    Interactive audio and video telecommunications were attempted between this provider and patient, however failed, due to patient having technical difficulties OR patient did not have access to video capability.  We continued and completed visit with audio only.  Some vital signs may be absent or patient reported.   Time Spent with patient on telephone encounter: 30 minutes  Subjective:   Brenda Hernandez is a 46 y.o. female who presents for Medicare Annual (Subsequent) preventive examination.  Review of Systems     Cardiac Risk Factors include: advanced age (>41mn, >>71women);obesity (BMI >30kg/m2);sedentary lifestyle     Objective:    There were no vitals filed for this visit. There is no height or weight on file to calculate BMI.     10/31/2021    2:05 PM 10/09/2020    5:10 PM 05/02/2020    5:11 PM 10/12/2018    2:26 PM  Advanced Directives  Does Patient Have a Medical Advance Directive? No No No No  Would patient like information on creating a medical advance directive? No - Patient declined Yes (MAU/Ambulatory/Procedural Areas - Information given) No - Patient declined     Current Medications (verified) Outpatient Encounter Medications as of 10/31/2021  Medication Sig   B Complex-Folic Acid (B COMPLEX PLUS) TABS Take 1 tablet by mouth daily.   clomiPRAMINE (ANAFRANIL) 75 MG  capsule Take 75 mg by mouth 2 (two) times daily.   cloZAPine (CLOZARIL) 100 MG tablet Take 500 mg by mouth daily.   fluticasone (FLONASE) 50 MCG/ACT nasal spray Place 1 spray into both nostrils daily.   ibuprofen (ADVIL) 400 MG tablet Take 1 tablet (400 mg total) by mouth every 8 (eight) hours as needed for moderate pain.   levonorgestrel-ethinyl estradiol (ALESSE) 0.1-20 MG-MCG tablet Sronyx 0.1 mg-20 mcg tablet  TAKE 1 TABLET BY MOUTH EVERY DAY   metFORMIN (GLUCOPHAGE) 1000 MG tablet Take 1,000 mg by mouth 2 (two) times daily with a meal.   valproic acid (DEPAKENE) 250 MG/5ML solution Take 250 mg by mouth at bedtime.   No facility-administered encounter medications on file as of 10/31/2021.    Allergies (verified) Citric acid and Diphenhydramine   History: Past Medical History:  Diagnosis Date   Abnormal pap    Bipolar 1 disorder (HCC)    GERD (gastroesophageal reflux disease)    Pre-diabetes    Schizophrenia (HCC)    Swallowing difficulty    History reviewed. No pertinent surgical history. Family History  Problem Relation Age of Onset   Diabetes Maternal Grandmother    Diabetes Maternal Grandfather    Diabetes Paternal Grandmother    Diabetes Paternal Grandfather    Breast cancer Maternal Aunt    Breast cancer Paternal Aunt    Diabetes Father    High blood pressure Father    High Cholesterol Father    Social History   Socioeconomic History   Marital status: Divorced    Spouse name: Not on file   Number  of children: Not on file   Years of education: Not on file   Highest education level: Not on file  Occupational History   Occupation: disability  Tobacco Use   Smoking status: Never   Smokeless tobacco: Never  Vaping Use   Vaping Use: Never used  Substance and Sexual Activity   Alcohol use: No   Drug use: No   Sexual activity: Not Currently    Birth control/protection: None, Abstinence  Other Topics Concern   Not on file  Social History Narrative   Not on  file   Social Determinants of Health   Financial Resource Strain: Low Risk  (10/31/2021)   Overall Financial Resource Strain (CARDIA)    Difficulty of Paying Living Expenses: Not hard at all  Food Insecurity: No Food Insecurity (10/31/2021)   Hunger Vital Sign    Worried About Running Out of Food in the Last Year: Never true    [place] in the Last Year: Never true  Transportation Needs: No Transportation Needs (10/31/2021)   PRAPARE - Hydrologist (Medical): No    Lack of Transportation (Non-Medical): No  Physical Activity: Sufficiently Active (10/31/2021)   Exercise Vital Sign    Days of Exercise per Week: 5 days    Minutes of Exercise per Session: 30 min  Stress: No Stress Concern Present (10/31/2021)   [place]    Feeling of Stress : Not at all  Social Connections: Socially Isolated (10/31/2021)   Social Connection and Isolation Panel [NHANES]    Frequency of Communication with Friends and Family: More than three times a week    Frequency of Social Gatherings with Friends and Family: Twice a week    Attends Religious Services: Never    Marine scientist or Organizations: No    Attends Music therapist: Never    Marital Status: Divorced    Tobacco Counseling Counseling given: Not Answered   Clinical Intake:  Pre-visit preparation completed: Yes  Pain : No/denies pain     Nutritional Risks: None Diabetes: No  How often do you need to have someone help you when you read instructions, pamphlets, or other written materials from your doctor or pharmacy?: 1 - Never What is the last grade level you completed in school?: B.A. Degree from [employer]  Diabetic? Prediabetic  Interpreter Needed?: No  Information entered by :: Penne Rosenstock,LPN.   Activities of Daily Living    10/31/2021    2:21 PM  In your present state of health, do you have  any difficulty performing the following activities:  Hearing? 0  Vision? 0  Difficulty concentrating or making decisions? 0  Walking or climbing stairs? 1  Dressing or bathing? 0  Doing errands, shopping? 0  Preparing Food and eating ? N  Using the Toilet? N  In the past six months, have you accidently leaked urine? Y  Do you have problems with loss of bowel control? N  Managing your Medications? N  Managing your Finances? N  Housekeeping or managing your Housekeeping? Y    Patient Care Team: Plotnikov, Evie Lacks, MD as PCP - General (Internal Medicine) Eliseo Gum, MD (Psychiatry) Netta Cedars, MD as Consulting Physician (Orthopedic Surgery) Starlyn Skeans, MD Everardo Pacific, [place] (Nurse Practitioner) Madilyn Hook, [place] as Consulting Physician (Optometry) Ob/Gyn, [facility] as Consulting Physician (Obstetrics and Gynecology)  Indicate any recent Medical Services you may have received  from other than [facility] providers in the past year (date may be approximate).     Assessment:   This is a routine wellness examination for Daniesha.  Hearing/Vision screen Hearing Screening - Comments:: Patient denied any hearing difficulty.   No hearing aids.  Vision Screening - Comments:: Patient does wear corrective lenses/contacts.  [facility] exam done by: [facility]   Dietary issues and exercise activities discussed: Current Exercise Habits: Home exercise routine, Type of exercise: strength training/weights;stretching, Time (Minutes): 30, Frequency (Times/Week): 5, Weekly Exercise (Minutes/Week): 150, Intensity: Mild, Exercise limited by: orthopedic condition(s);psychological condition(s)   Goals Addressed             This Visit's Progress    To lose weight so that I can have knee surgery.        Depression Screen    10/31/2021    2:19 PM 03/26/2020    8:02 AM 10/12/2018    2:27 PM 07/02/2015    6:01 PM 10/11/2014    3:59 PM  PHQ 2/9 Scores  PHQ - 2 Score 0 4 0  0 0  PHQ- 9 Score  10 0      Fall Risk    10/31/2021    2:07 PM 09/23/2020    2:41 PM 10/12/2018    2:27 PM  [place] in the past year? 0 1 0  Number falls in past yr: 0 0   Injury with Fall? 0 1   Risk for fall due to : No Fall Risks Impaired balance/gait;Impaired mobility;Orthopedic patient Medication side effect  Follow up Falls evaluation completed  Falls evaluation completed;Education provided;Falls prevention discussed    FALL RISK PREVENTION PERTAINING TO THE HOME:  Any stairs in or around the home? No  If so, are there any without handrails? No  Home free of loose throw rugs in walkways, pet beds, electrical cords, etc? Yes  Adequate lighting in your home to reduce risk of falls? Yes   ASSISTIVE DEVICES UTILIZED TO PREVENT FALLS:  Life alert? No  Use of a cane, walker or w/c? No  Grab bars in the bathroom? Yes  Shower chair or bench in shower? Yes  Elevated toilet seat or a handicapped toilet? No   TIMED UP AND GO:  Was the test performed? No .  Length of time to ambulate 10 feet: n/a sec.   Appearance of gait: Gait not evaluated during this visit.  Cognitive Function:        10/31/2021    2:22 PM 10/12/2018    2:29 PM  6CIT Screen  What Year? 0 points 0 points  What month? 0 points 0 points  What time? 0 points 0 points  Count back from 20 0 points 0 points  Months in reverse 0 points 0 points  Repeat phrase 0 points 0 points  Total Score 0 points 0 points    Immunizations Immunization History  Administered Date(s) Administered   HPV 9-valent 03/11/2020, 05/29/2020, 08/16/2020   Influenza Inj Mdck Quad Pf 01/02/2017   Influenza,inj,Quad PF,6+ Mos 02/13/2018, 12/24/2018, 02/19/2020, 12/24/2020   PFIZER(Purple Top)SARS-COV-2 Vaccination 05/27/2019, 06/17/2019   PPD Test 05/20/2017   Tdap 05/14/2017    TDAP status: Up to date  Flu Vaccine status: Up to date  Pneumococcal vaccine status: Declined,  Education has been provided regarding  the importance of this vaccine but patient still declined. Advised may receive this vaccine at local pharmacy or Health Dept. Aware to provide a copy of the vaccination record  if obtained from local pharmacy or Health Dept. Verbalized acceptance and understanding.   Covid-19 vaccine status: Completed vaccines  Qualifies for Shingles Vaccine? No   Zostavax completed No   Shingrix Completed?: No.    Education has been provided regarding the importance of this vaccine. Patient has been advised to call insurance company to determine out of pocket expense if they have not yet received this vaccine. Advised may also receive vaccine at local pharmacy or Health Dept. Verbalized acceptance and understanding.  Screening Tests Health Maintenance  Topic Date Due   FOOT EXAM  Never done   HIV Screening  Never done   Hepatitis C Screening  Never done   Diabetic kidney evaluation - Urine ACR  10/23/2015   COVID-19 Vaccine (3 - Pfizer risk series) 07/15/2019   MAMMOGRAM  01/10/2020   HPV VACCINES (3 - Risk 3-dose SCDM series) 12/16/2020   INFLUENZA VACCINE  10/14/2021   HEMOGLOBIN A1C  12/28/2021   Diabetic kidney evaluation - GFR measurement  06/29/2022   OPHTHALMOLOGY EXAM  08/26/2022   PAP SMEAR-Modifier  02/13/2024   TETANUS/TDAP  05/15/2027    Health Maintenance  Health Maintenance Due  Topic Date Due   FOOT EXAM  Never done   HIV Screening  Never done   Hepatitis C Screening  Never done   Diabetic kidney evaluation - Urine ACR  10/23/2015   COVID-19 Vaccine (3 - Pfizer risk series) 07/15/2019   MAMMOGRAM  01/10/2020   HPV VACCINES (3 - Risk 3-dose SCDM series) 12/16/2020   INFLUENZA VACCINE  10/14/2021    Colorectal screening: never done  Mammogram status: Completed 12/2020. Repeat every year  Bone Density screening: never done  Lung Cancer Screening: (Low Dose CT Chest recommended if Age 69-80 years, 30 pack-year currently smoking OR have quit w/in 15years.) does not qualify.    Lung Cancer Screening Referral: no  Additional Screening:  Hepatitis C Screening: does qualify; Completed no  Vision Screening: Recommended annual ophthalmology exams for early detection of glaucoma and other disorders of the eye. Is the patient up to date with their annual eye exam?  Yes  Who is the provider or what is the name of the office in which the patient attends annual eye exams? Madilyn Hook, OD. If pt is not established with a provider, would they like to be referred to a provider to establish care? No .   Dental Screening: Recommended annual dental exams for proper oral hygiene  Community Resource Referral / Chronic Care Management: CRR required this visit?  No   CCM required this visit?  No      Plan:     I have personally reviewed and noted the following in the patient's chart:   Medical and social history Use of alcohol, tobacco or illicit drugs  Current medications and supplements including opioid prescriptions. Patient is not currently taking opioid prescriptions. Functional ability and status Nutritional status Physical activity Advanced directives List of other physicians Hospitalizations, surgeries, and ER visits in previous 12 months Vitals Screenings to include cognitive, depression, and falls Referrals and appointments  In addition, I have reviewed and discussed with patient certain preventive protocols, quality metrics, and best practice recommendations. A written personalized care plan for preventive services as well as general preventive health recommendations were provided to patient.     Sheral Flow, LPN   07/19/3974   Nurse Notes:  There were no vitals filed for this visit. There is no height or weight on file to calculate  BMI. Patient stated that she has no issues with gait or balance; does not use any assistive devices.  Medical screening examination/treatment/procedure(s) were performed by non-physician practitioner and as  supervising physician I was immediately available for consultation/collaboration.  I agree with above. Lew Dawes, MD

## 2021-11-01 ENCOUNTER — Other Ambulatory Visit (HOSPITAL_COMMUNITY)
Admission: AD | Admit: 2021-11-01 | Discharge: 2021-11-01 | Disposition: A | Discharge status: Home / Self Care | Payer: Medicare HMO | Source: Ambulatory Visit | Attending: Psychology | Admitting: Psychology

## 2021-11-01 DIAGNOSIS — E538 Deficiency of other specified B group vitamins: Secondary | ICD-10-CM | POA: Insufficient documentation

## 2021-11-01 DIAGNOSIS — E559 Vitamin D deficiency, unspecified: Secondary | ICD-10-CM | POA: Insufficient documentation

## 2021-11-01 DIAGNOSIS — F259 Schizoaffective disorder, unspecified: Secondary | ICD-10-CM | POA: Insufficient documentation

## 2021-11-01 DIAGNOSIS — R7309 Other abnormal glucose: Secondary | ICD-10-CM | POA: Diagnosis not present

## 2021-11-01 DIAGNOSIS — Z79899 Other long term (current) drug therapy: Secondary | ICD-10-CM | POA: Diagnosis not present

## 2021-11-01 LAB — COMPREHENSIVE METABOLIC PANEL
ALT: 11 U/L (ref 0–44)
AST: 17 U/L (ref 15–41)
Albumin: 3.3 g/dL — ABNORMAL LOW (ref 3.5–5.0)
Alkaline Phosphatase: 66 U/L (ref 38–126)
Anion gap: 11 (ref 5–15)
BUN: 9 mg/dL (ref 6–20)
CO2: 22 mmol/L (ref 22–32)
Calcium: 8.7 mg/dL — ABNORMAL LOW (ref 8.9–10.3)
Chloride: 106 mmol/L (ref 98–111)
Creatinine, Ser: 0.93 mg/dL (ref 0.44–1.00)
GFR, Estimated: >60 mL/min (ref >60)
Glucose, Bld: 91 mg/dL (ref 70–99)
Potassium: 4.1 mmol/L (ref 3.5–5.1)
Sodium: 139 mmol/L (ref 135–145)
Total Bilirubin: 0.4 mg/dL (ref 0.3–1.2)
Total Protein: 7.5 g/dL (ref 6.5–8.1)

## 2021-11-01 LAB — CBC WITH DIFFERENTIAL/PLATELET
Abs Immature Granulocytes: 0.03 10*3/uL (ref 0.00–0.07)
Basophils Absolute: 0 10*3/uL (ref 0.0–0.1)
Basophils Relative: 0 %
Eosinophils Absolute: 0 10*3/uL (ref 0.0–0.5)
Eosinophils Relative: 0 %
HCT: 40.3 % (ref 36.0–46.0)
Hemoglobin: 13.6 g/dL (ref 12.0–15.0)
Immature Granulocytes: 0 %
Lymphocytes Relative: 49 %
Lymphs Abs: 3.9 10*3/uL (ref 0.7–4.0)
MCH: 29.6 pg (ref 26.0–34.0)
MCHC: 33.7 g/dL (ref 30.0–36.0)
MCV: 87.8 fL (ref 80.0–100.0)
Monocytes Absolute: 0.3 10*3/uL (ref 0.1–1.0)
Monocytes Relative: 4 %
Neutro Abs: 3.8 10*3/uL (ref 1.7–7.7)
Neutrophils Relative %: 47 %
Platelets: 247 10*3/uL (ref 150–400)
RBC: 4.59 MIL/uL (ref 3.87–5.11)
RDW: 17.5 % — ABNORMAL HIGH (ref 11.5–15.5)
WBC: 8 10*3/uL (ref 4.0–10.5)
nRBC: 0 % (ref 0.0–0.2)

## 2021-11-01 LAB — LIPID PANEL
Cholesterol: 193 mg/dL (ref 0–200)
HDL: 73 mg/dL (ref >40)
LDL Cholesterol: 101 mg/dL — ABNORMAL HIGH (ref 0–99)
Total CHOL/HDL Ratio: 2.6 RATIO
Triglycerides: 94 mg/dL (ref <150)
VLDL: 19 mg/dL (ref 0–40)

## 2021-11-01 LAB — FOLATE: Folate: >40 ng/mL (ref >5.9)

## 2021-11-01 LAB — VALPROIC ACID LEVEL: Valproic Acid Lvl: 50 ug/mL (ref 50.0–100.0)

## 2021-11-01 LAB — VITAMIN B12: Vitamin B-12: 300 pg/mL (ref 180–914)

## 2021-11-01 LAB — TSH: TSH: 2.443 u[IU]/mL (ref 0.350–4.500)

## 2021-11-01 LAB — HEMOGLOBIN A1C
Hgb A1c MFr Bld: 5.2 % (ref 4.8–5.6)
Mean Plasma Glucose: 102.54 mg/dL

## 2021-11-05 ENCOUNTER — Ambulatory Visit (INDEPENDENT_AMBULATORY_CARE_PROVIDER_SITE_OTHER): Payer: Medicare HMO | Admitting: Internal Medicine

## 2021-11-05 ENCOUNTER — Encounter: Payer: Self-pay | Admitting: Internal Medicine

## 2021-11-05 DIAGNOSIS — M25562 Pain in left knee: Secondary | ICD-10-CM

## 2021-11-05 DIAGNOSIS — F250 Schizoaffective disorder, bipolar type: Secondary | ICD-10-CM | POA: Diagnosis not present

## 2021-11-05 DIAGNOSIS — G8929 Other chronic pain: Secondary | ICD-10-CM | POA: Diagnosis not present

## 2021-11-05 DIAGNOSIS — E538 Deficiency of other specified B group vitamins: Secondary | ICD-10-CM

## 2021-11-05 DIAGNOSIS — Z6841 Body Mass Index (BMI) 40.0 and over, adult: Secondary | ICD-10-CM

## 2021-11-05 DIAGNOSIS — F3170 Bipolar disorder, currently in remission, most recent episode unspecified: Secondary | ICD-10-CM | POA: Diagnosis not present

## 2021-11-05 DIAGNOSIS — R7303 Prediabetes: Secondary | ICD-10-CM

## 2021-11-05 MED ORDER — VALPROIC ACID 250 MG/5ML PO SOLN: 250.0000 mg | Freq: Every day | ORAL | 2 refills | Status: DC

## 2021-11-05 MED ORDER — CLOZAPINE 100 MG PO TABS: 500.0000 mg | ORAL_TABLET | Freq: Every day | ORAL | 2 refills | Status: AC

## 2021-11-05 NOTE — Progress Notes (Signed)
Subjective:  Patient ID: Brenda Hernandez, female    DOB: [birth year]  Age: 46 y.o. MRN: [id number]  CC: Follow-up (3 month f/u)   HPI Mennie Brockbank presents for knee pain, obesity  Zyriah needs to be <225 lbs, BMI <40 for knee surgery F/u w/Dr [name]  Outpatient Medications Prior to Visit  Medication Sig Dispense Refill   B Complex-Folic Acid (B COMPLEX PLUS) TABS Take 1 tablet by mouth daily. 100 tablet 5   clomiPRAMINE (ANAFRANIL) 75 MG capsule Take 75 mg by mouth 2 (two) times daily.     fluticasone (FLONASE) 50 MCG/ACT nasal spray Place 1 spray into both nostrils daily. 16 g 6   ibuprofen (ADVIL) 400 MG tablet Take 1 tablet (400 mg total) by mouth every 8 (eight) hours as needed for moderate pain. 90 tablet 2   levonorgestrel-ethinyl estradiol (ALESSE) 0.1-20 MG-MCG tablet Sronyx 0.1 mg-20 mcg tablet  TAKE 1 TABLET BY MOUTH EVERY DAY     metFORMIN (GLUCOPHAGE) 1000 MG tablet Take 1,000 mg by mouth 2 (two) times daily with a meal.     cloZAPine (CLOZARIL) 100 MG tablet Take 500 mg by mouth daily.     valproic acid (DEPAKENE) 250 MG/5ML solution Take 250 mg by mouth at bedtime.     No facility-administered medications prior to visit.    ROS: Review of Systems  Constitutional:  Positive for fatigue. Negative for activity change, appetite change, chills and unexpected weight change.  HENT:  Negative for congestion, mouth sores and sinus pressure.   Eyes:  Negative for visual disturbance.  Respiratory:  Negative for cough and chest tightness.   Gastrointestinal:  Negative for abdominal pain, nausea and vomiting.  Genitourinary:  Negative for difficulty urinating, frequency and vaginal pain.  Musculoskeletal:  Positive for arthralgias and gait problem. Negative for back pain.  Skin:  Negative for pallor and rash.  Neurological:  Negative for dizziness, tremors, weakness, numbness and headaches.  Psychiatric/Behavioral:  Negative for confusion, decreased concentration, dysphoric  mood, sleep disturbance and suicidal ideas. The patient is not nervous/anxious.     Objective:  BP 134/82 (BP Location: Left Arm)   Pulse (!) 107   Temp 97.9 F (36.6 C) (Oral)   Ht '5\' 3"'$  (1.6 m)   Wt 280 lb (127 kg)   SpO2 97%   BMI 49.60 kg/m   BP Readings from Last 3 Encounters:  11/05/21 134/82  09/24/21 119/88  08/27/21 129/88    Wt Readings from Last 3 Encounters:  11/05/21 280 lb (127 kg)  09/24/21 286 lb (129.7 kg)  08/27/21 287 lb (130.2 kg)    Physical Exam Constitutional:      General: She is not in acute distress.    Appearance: She is well-developed. She is obese.  HENT:     Head: Normocephalic.     Right Ear: External ear normal.     Left Ear: External ear normal.     Nose: Nose normal.  Eyes:     General:        Right eye: No discharge.        Left eye: No discharge.     Conjunctiva/sclera: Conjunctivae normal.     Pupils: Pupils are equal, round, and reactive to light.  Neck:     Thyroid: No thyromegaly.     Vascular: No JVD.     Trachea: No tracheal deviation.  Cardiovascular:     Rate and Rhythm: Normal rate and regular rhythm.     Heart sounds: Normal  heart sounds.  Pulmonary:     Effort: No respiratory distress.     Breath sounds: No stridor. No wheezing.  Abdominal:     General: Bowel sounds are normal. There is no distension.     Palpations: Abdomen is soft. There is no mass.     Tenderness: There is abdominal tenderness. There is no guarding or rebound.  Musculoskeletal:        General: No tenderness.     Cervical back: Normal range of motion and neck supple. No rigidity.  Lymphadenopathy:     Cervical: No cervical adenopathy.  Skin:    Findings: No erythema or rash.  Neurological:     Cranial Nerves: No cranial nerve deficit.     Motor: No abnormal muscle tone.     Coordination: Coordination normal.     Deep Tendon Reflexes: Reflexes normal.  Psychiatric:        Behavior: Behavior normal.        Thought Content: Thought  content normal.        Judgment: Judgment normal.   Using a walker  Lab Results  Component Value Date   WBC 8.0 11/01/2021   HGB 13.6 11/01/2021   HCT 40.3 11/01/2021   PLT 247 11/01/2021   GLUCOSE 91 11/01/2021   CHOL 193 11/01/2021   TRIG 94 11/01/2021   HDL 73 11/01/2021   LDLCALC 101 (H) 11/01/2021   ALT 11 11/01/2021   AST 17 11/01/2021   NA 139 11/01/2021   K 4.1 11/01/2021   CL 106 11/01/2021   CREATININE 0.93 11/01/2021   BUN 9 11/01/2021   CO2 22 11/01/2021   TSH 2.443 11/01/2021   HGBA1C 5.2 11/01/2021    No results found.  Assessment & Plan:   Problem List Items Addressed This Visit     [place] psychiatrist Dr Michaela Corner, DNP - OV in Nov 2023. I renewed her meds till that appt. Clozaril Depakane      Class 3 severe obesity with serious comorbidity and body mass index (BMI) of 50.0 to 59.9 in adult [facility])    Lafaye needs to be <225 lbs, BMI <40 for knee surgery F/u w/Dr [name]      Knee pain, left    Jacquelinne needs to be <225 lbs, BMI <40 for knee surgery F/u w/Dr [name]      Low serum vitamin B12    On B12      Pre-diabetes    Monitor glucose Cont w/wt loss effort      Schizoaffective disorder [facility])    New psychiatrist Dr Michaela Corner, [place] in Nov 2023. I renewed her meds till that appt. Clozaril Depakane         Meds ordered this encounter  Medications   cloZAPine (CLOZARIL) 100 MG tablet    Sig: Take 5 tablets (500 mg total) by mouth daily.    Dispense:  150 tablet    Refill:  2   valproic acid (DEPAKENE) 250 MG/5ML solution    Sig: Take 5 mLs (250 mg total) by mouth at bedtime.    Dispense:  600 mL    Refill:  2      Follow-up: Return in about 3 months (around 02/05/2022) for a follow-up visit.  Walker Kehr, MD

## 2021-11-05 NOTE — Assessment & Plan Note (Addendum)
New psychiatrist Dr Michaela Corner, [place] in Nov 2023. I renewed her meds till that appt. Clozaril Depakane

## 2021-11-05 NOTE — Patient Instructions (Signed)
Try trekking poles 

## 2021-11-05 NOTE — Assessment & Plan Note (Addendum)
Brenda Hernandez needs to be <225 lbs, BMI <40 for knee surgery F/u w/Dr [name] Try trekking poles

## 2021-11-05 NOTE — Assessment & Plan Note (Signed)
Brenda Hernandez needs to be <225 lbs, BMI <40 for knee surgery F/u w/Dr [name]

## 2021-11-05 NOTE — Assessment & Plan Note (Signed)
On B12 

## 2021-11-05 NOTE — Assessment & Plan Note (Signed)
Monitor glucose Cont w/wt loss effort

## 2021-11-05 NOTE — Assessment & Plan Note (Addendum)
New psychiatrist Dr Michaela Corner, Brenda Hernandez in Nov 2023. I renewed her meds till that appt. Clozaril Depakane

## 2021-11-10 ENCOUNTER — Ambulatory Visit (INDEPENDENT_AMBULATORY_CARE_PROVIDER_SITE_OTHER): Payer: Medicare HMO | Admitting: Adult Health

## 2021-11-12 ENCOUNTER — Ambulatory Visit (INDEPENDENT_AMBULATORY_CARE_PROVIDER_SITE_OTHER): Payer: Medicare HMO | Admitting: Adult Health

## 2021-11-12 ENCOUNTER — Encounter (INDEPENDENT_AMBULATORY_CARE_PROVIDER_SITE_OTHER): Payer: Self-pay | Admitting: Adult Health

## 2021-11-12 VITALS — BP 123/87 | HR 78 | Temp 97.9°F | Ht 63.0 in | Wt 276.0 lb

## 2021-11-12 DIAGNOSIS — F39 Unspecified mood [affective] disorder: Secondary | ICD-10-CM

## 2021-11-12 DIAGNOSIS — M25562 Pain in left knee: Secondary | ICD-10-CM | POA: Diagnosis not present

## 2021-11-12 DIAGNOSIS — R7303 Prediabetes: Secondary | ICD-10-CM

## 2021-11-12 DIAGNOSIS — Z6841 Body Mass Index (BMI) 40.0 and over, adult: Secondary | ICD-10-CM

## 2021-11-12 DIAGNOSIS — G8929 Other chronic pain: Secondary | ICD-10-CM | POA: Diagnosis not present

## 2021-11-12 DIAGNOSIS — R6889 Other general symptoms and signs: Secondary | ICD-10-CM | POA: Diagnosis not present

## 2021-11-12 DIAGNOSIS — E669 Obesity, unspecified: Secondary | ICD-10-CM

## 2021-11-12 LAB — VITAMIN D 1,25 DIHYDROXY
Vitamin D 1, 25 (OH)2 Total: 43 pg/mL
Vitamin D2 1, 25 (OH)2: <10 pg/mL
Vitamin D3 1, 25 (OH)2: 37 pg/mL

## 2021-11-13 DIAGNOSIS — R6889 Other general symptoms and signs: Secondary | ICD-10-CM | POA: Diagnosis not present

## 2021-11-13 DIAGNOSIS — F319 Bipolar disorder, unspecified: Secondary | ICD-10-CM | POA: Diagnosis not present

## 2021-11-14 DIAGNOSIS — Z79899 Other long term (current) drug therapy: Secondary | ICD-10-CM | POA: Diagnosis not present

## 2021-11-16 NOTE — Progress Notes (Unsigned)
Chief Complaint:   OBESITY Brenda Hernandez is here to discuss her progress with her obesity treatment plan along with follow-up of her obesity related diagnoses. Brenda Hernandez is on the Category 2 Plan and states she is following her eating plan approximately 50% of the time. Brenda Hernandez states she is doing strengthening exercises 15 minutes 7 times per week.  Today's visit was #: 28 Starting weight: 287 lbs Starting date: 03/26/2020 Today's weight: 276 lbs Today's date: 11/12/2021 Total lbs lost to date: 11 lbs Total lbs lost since last in-office visit: 10 lbs  Interim History: 10/09/2021 Covid-19 positive.  She was treated with a five day course of ***. Eats one large meal, example ***.  Dessert with tea, chess cake cup.  Breakfast croissant with ham and cheese, stewed apples, grits. Lunch ***  Subjective:   1. Prediabetes Psychiatrist Sharon Seller, DNP provides Metformin 1000 mg BID.   2. Chronic pain of left knee Current BMI 48.9, requires BMI of <40 for total left knee replacement.    3. 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  Brenda Hernandez 75 mg BID, Brenda Hernandez 100 mg 5 tablets daily, Brenda Hernandez 250 mg/5 mL at bedtime.   Assessment/Plan:   1. Prediabetes Increase protein, decrease carbohydrate and sugar. Follow up with Sharon Seller, [facility] 01/28/2022.  2. Chronic pain of left knee Continue with weight loss efforts.   3. Mood disorder ([place]) with emotional eating Follow up with [place] as directed.   4. Obesity with current BMI 48.9 Handouts:  1)Additional breakfast options.  2)Eating out guide. 3) protein content of food.   Brenda Hernandez is currently in the action stage of change. As such, her goal is to continue with weight loss efforts. She has agreed to the Category 2 Plan.   Exercise goals:  As is.   Behavioral modification strategies: increasing lean protein intake, decreasing simple carbohydrates, meal planning and cooking strategies, keeping  healthy foods in the home, and planning for success.  Brenda Hernandez has agreed to follow-up with our clinic in 4 weeks. She was informed of the importance of frequent follow-up visits to maximize her success with intensive lifestyle modifications for her multiple health conditions.   Objective:   Blood pressure 123/87, pulse 78, temperature 97.9 F (36.6 C), height '5\' 3"'$  (1.6 m), weight 276 lb (125.2 kg), SpO2 99 %. Body mass index is 48.89 kg/m.  General: Cooperative, alert, well developed, in no acute distress. HEENT: Conjunctivae and lids unremarkable. Cardiovascular: Regular rhythm.  Lungs: Normal work of breathing. Neurologic: No focal deficits.   Lab Results  Component Value Date   CREATININE 0.93 11/01/2021   BUN 9 11/01/2021   NA 139 11/01/2021   K 4.1 11/01/2021   CL 106 11/01/2021   CO2 22 11/01/2021   Lab Results  Component Value Date   ALT 11 11/01/2021   AST 17 11/01/2021   ALKPHOS 66 11/01/2021   BILITOT 0.4 11/01/2021   Lab Results  Component Value Date   HGBA1C 5.2 11/01/2021   HGBA1C 5.6 06/28/2021   HGBA1C 5.6 06/18/2021   HGBA1C 5.5 12/28/2020   HGBA1C 5.5 12/05/2020   Lab Results  Component Value Date   INSULIN 14.1 06/18/2021   INSULIN 6.0 12/05/2020   INSULIN 10.8 07/18/2020   INSULIN 24.7 03/26/2020   Lab Results  Component Value Date   TSH 2.443 11/01/2021   Lab Results  Component Value Date   CHOL 193 11/01/2021   HDL 73 11/01/2021   LDLCALC 101 (H) 11/01/2021  TRIG 94 11/01/2021   CHOLHDL 2.6 11/01/2021   Lab Results  Component Value Date   VD25OH 52.44 12/28/2020   VD25OH 46.8 12/05/2020   VD25OH 91.02 08/03/2020   Lab Results  Component Value Date   WBC 8.0 11/01/2021   HGB 13.6 11/01/2021   HCT 40.3 11/01/2021   MCV 87.8 11/01/2021   PLT 247 11/01/2021   No results found for: "IRON", "TIBC", "FERRITIN"  Obesity Behavioral Intervention:   Approximately 15 minutes were spent on the discussion below.  ASK: We  discussed the diagnosis of obesity with Brenda Hernandez today and Brenda Hernandez agreed to give [place] permission to discuss obesity behavioral modification therapy today.  ASSESS: Brenda Hernandez has the diagnosis of obesity and her BMI today is 48.9. Brenda Hernandez is in the action stage of change.   ADVISE: Brenda Hernandez was educated on the multiple health risks of obesity as well as the benefit of weight loss to improve her health. She was advised of the need for long term treatment and the importance of lifestyle modifications to improve her current health and to decrease her risk of future health problems.  AGREE: Multiple dietary modification options and treatment options were discussed and Brenda Hernandez agreed to follow the recommendations documented in the above note.  ARRANGE: Brenda Hernandez was educated on the importance of frequent visits to treat obesity as outlined per CMS and USPSTF guidelines and agreed to schedule her next follow up appointment today.  Attestation Statements:   Reviewed by clinician on day of visit: allergies, medications, problem list, medical history, surgical history, family history, social history, and previous encounter notes.  Time spent on visit including pre-visit chart review and post-visit care and charting was 29 minutes.   I, Davy Pique, RMA, am acting as Location manager for [name], NP.  I have reviewed the above documentation for accuracy and completeness, and I agree with the above. -  ***

## 2021-11-21 DIAGNOSIS — R6889 Other general symptoms and signs: Secondary | ICD-10-CM | POA: Diagnosis not present

## 2021-11-21 DIAGNOSIS — F319 Bipolar disorder, unspecified: Secondary | ICD-10-CM | POA: Diagnosis not present

## 2021-11-21 DIAGNOSIS — F429 Obsessive-compulsive disorder, unspecified: Secondary | ICD-10-CM | POA: Diagnosis not present

## 2021-11-27 DIAGNOSIS — R6889 Other general symptoms and signs: Secondary | ICD-10-CM | POA: Diagnosis not present

## 2021-11-27 DIAGNOSIS — F319 Bipolar disorder, unspecified: Secondary | ICD-10-CM | POA: Diagnosis not present

## 2021-11-29 ENCOUNTER — Other Ambulatory Visit (HOSPITAL_COMMUNITY)
Admission: AD | Admit: 2021-11-29 | Discharge: 2021-11-29 | Disposition: A | Discharge status: Home / Self Care | Payer: Medicare HMO | Attending: Psychology | Admitting: Psychology

## 2021-11-29 DIAGNOSIS — Z79899 Other long term (current) drug therapy: Secondary | ICD-10-CM | POA: Diagnosis not present

## 2021-11-29 LAB — CBC WITH DIFFERENTIAL/PLATELET
Abs Immature Granulocytes: 0.06 10*3/uL (ref 0.00–0.07)
Basophils Absolute: 0 10*3/uL (ref 0.0–0.1)
Basophils Relative: 0 %
Eosinophils Absolute: 0 10*3/uL (ref 0.0–0.5)
Eosinophils Relative: 0 %
HCT: 37.8 % (ref 36.0–46.0)
Hemoglobin: 13.1 g/dL (ref 12.0–15.0)
Immature Granulocytes: 1 %
Lymphocytes Relative: 52 %
Lymphs Abs: 4.4 10*3/uL — ABNORMAL HIGH (ref 0.7–4.0)
MCH: 30.3 pg (ref 26.0–34.0)
MCHC: 34.7 g/dL (ref 30.0–36.0)
MCV: 87.5 fL (ref 80.0–100.0)
Monocytes Absolute: 0.6 10*3/uL (ref 0.1–1.0)
Monocytes Relative: 7 %
Neutro Abs: 3.3 10*3/uL (ref 1.7–7.7)
Neutrophils Relative %: 40 %
Platelets: 260 10*3/uL (ref 150–400)
RBC: 4.32 MIL/uL (ref 3.87–5.11)
RDW: 17.5 % — ABNORMAL HIGH (ref 11.5–15.5)
WBC: 8.4 10*3/uL (ref 4.0–10.5)
nRBC: 0 % (ref 0.0–0.2)

## 2021-12-04 DIAGNOSIS — R6889 Other general symptoms and signs: Secondary | ICD-10-CM | POA: Diagnosis not present

## 2021-12-04 DIAGNOSIS — F319 Bipolar disorder, unspecified: Secondary | ICD-10-CM | POA: Diagnosis not present

## 2021-12-04 DIAGNOSIS — F429 Obsessive-compulsive disorder, unspecified: Secondary | ICD-10-CM | POA: Diagnosis not present

## 2021-12-10 ENCOUNTER — Encounter (INDEPENDENT_AMBULATORY_CARE_PROVIDER_SITE_OTHER): Payer: Self-pay | Admitting: Adult Health

## 2021-12-10 ENCOUNTER — Ambulatory Visit (INDEPENDENT_AMBULATORY_CARE_PROVIDER_SITE_OTHER): Payer: Medicare HMO | Admitting: Adult Health

## 2021-12-10 VITALS — BP 122/85 | HR 92 | Temp 97.8°F | Ht 63.0 in | Wt 278.0 lb

## 2021-12-10 DIAGNOSIS — E669 Obesity, unspecified: Secondary | ICD-10-CM

## 2021-12-10 DIAGNOSIS — Z6841 Body Mass Index (BMI) 40.0 and over, adult: Secondary | ICD-10-CM

## 2021-12-10 DIAGNOSIS — R5383 Other fatigue: Secondary | ICD-10-CM | POA: Diagnosis not present

## 2021-12-10 DIAGNOSIS — E559 Vitamin D deficiency, unspecified: Secondary | ICD-10-CM

## 2021-12-10 DIAGNOSIS — R6889 Other general symptoms and signs: Secondary | ICD-10-CM | POA: Diagnosis not present

## 2021-12-12 NOTE — Progress Notes (Unsigned)
Chief Complaint:   OBESITY Brenda Hernandez is here to discuss her progress with her obesity treatment plan along with follow-up of her obesity related diagnoses. Brenda Hernandez is on the Category 2 Plan and states she is following her eating plan approximately 45% of the time. Brenda Hernandez states she is doing strength training 10-15 minutes 7 times per week.  Today's visit was #: 82 Starting weight: 287 lbs Starting date: 03/26/2020 Today's weight: 278 lbs Today's date: 12/10/2021 Total lbs lost to date: 9 lbs Total lbs lost since last in-office visit: +2 lbs  Interim History: [facility].  Brenda Seller, DNP, medical treatment.  Brenda Gentile, LCSW, weekly therapy sessions.  Follow up with B Alroy Dust in November 2023. She estimates 1200 calories per day. She eats [employer]achi shrimp roll, rice, macaroni cheese, corn dog with french fries.  She wakes up at noon, eats one large meal at night.  Jimmy Dean sandwich, stewed apple.   Subjective:   1. Vitamin D deficiency 11/06/2021, Vitamin D1 43, Vitamin D2 <10, Vitamin D3 37  2. Other fatigue ***  Assessment/Plan:   1. Vitamin D deficiency Start OTC multivitamin.   2. Other fatigue Follow category 2 meal plan.   3. Obesity with current BMI 49.3 1) Set up regular rides to the gym to swim via [employer]. [facility])  [place] is currently in the action stage of change. As such, her goal is to continue with weight loss efforts. She has agreed to the Category 2 Plan.   Exercise goals:  Continue with strength training and add in swimming, 1 every 2 weeks.   Behavioral modification strategies: increasing lean protein intake, decreasing simple carbohydrates, meal planning and cooking strategies, keeping healthy foods in the home, and planning for success.  Brenda Hernandez has agreed to follow-up with our clinic in 4 weeks. She was informed of the importance of frequent follow-up visits to maximize her success with intensive lifestyle  modifications for her multiple health conditions.   Objective:   Blood pressure 122/85, pulse 92, temperature 97.8 F (36.6 C), height '5\' 3"'$  (1.6 m), weight 278 lb (126.1 kg), SpO2 97 %. Body mass index is 49.25 kg/m.  General: Cooperative, alert, well developed, in no acute distress. HEENT: Conjunctivae and lids unremarkable. Cardiovascular: Regular rhythm.  Lungs: Normal work of breathing. Neurologic: No focal deficits.   Lab Results  Component Value Date   CREATININE 0.93 11/01/2021   BUN 9 11/01/2021   NA 139 11/01/2021   K 4.1 11/01/2021   CL 106 11/01/2021   CO2 22 11/01/2021   Lab Results  Component Value Date   ALT 11 11/01/2021   AST 17 11/01/2021   ALKPHOS 66 11/01/2021   BILITOT 0.4 11/01/2021   Lab Results  Component Value Date   HGBA1C 5.2 11/01/2021   HGBA1C 5.6 06/28/2021   HGBA1C 5.6 06/18/2021   HGBA1C 5.5 12/28/2020   HGBA1C 5.5 12/05/2020   Lab Results  Component Value Date   INSULIN 14.1 06/18/2021   INSULIN 6.0 12/05/2020   INSULIN 10.8 07/18/2020   INSULIN 24.7 03/26/2020   Lab Results  Component Value Date   TSH 2.443 11/01/2021   Lab Results  Component Value Date   CHOL 193 11/01/2021   HDL 73 11/01/2021   LDLCALC 101 (H) 11/01/2021   TRIG 94 11/01/2021   CHOLHDL 2.6 11/01/2021   Lab Results  Component Value Date   VD25OH 52.44 12/28/2020   VD25OH 46.8 12/05/2020   VD25OH 91.02 08/03/2020  Lab Results  Component Value Date   WBC 8.4 11/29/2021   HGB 13.1 11/29/2021   HCT 37.8 11/29/2021   MCV 87.5 11/29/2021   PLT 260 11/29/2021   No results found for: "IRON", "TIBC", "FERRITIN"  Obesity Behavioral Intervention:   Approximately 15 minutes were spent on the discussion below.  ASK: We discussed the diagnosis of obesity with [name] today and [name] agreed to give [place] permission to discuss obesity behavioral modification therapy today.  ASSESS: Brenda Hernandez has the diagnosis of obesity and her BMI today is 49.3.  [place] is in the action stage of change.   ADVISE: [name] was educated on the multiple health risks of obesity as well as the benefit of weight loss to improve her health. She was advised of the need for long term treatment and the importance of lifestyle modifications to improve her current health and to decrease her risk of future health problems.  AGREE: Multiple dietary modification options and treatment options were discussed and [name] agreed to follow the recommendations documented in the above note.  ARRANGE: Brenda Hernandez was educated on the importance of frequent visits to treat obesity as outlined per CMS and USPSTF guidelines and agreed to schedule her next follow up appointment today.  Attestation Statements:   Reviewed by clinician on day of visit: allergies, medications, problem list, medical history, surgical history, family history, social history, and previous encounter notes.  I, Davy Pique, RMA, am acting as Location manager for [name], NP.  I have reviewed the above documentation for accuracy and completeness, and I agree with the above. -  ***

## 2021-12-19 DIAGNOSIS — R6889 Other general symptoms and signs: Secondary | ICD-10-CM | POA: Diagnosis not present

## 2021-12-19 DIAGNOSIS — F319 Bipolar disorder, unspecified: Secondary | ICD-10-CM | POA: Diagnosis not present

## 2022-01-01 DIAGNOSIS — F319 Bipolar disorder, unspecified: Secondary | ICD-10-CM | POA: Diagnosis not present

## 2022-01-01 DIAGNOSIS — R6889 Other general symptoms and signs: Secondary | ICD-10-CM | POA: Diagnosis not present

## 2022-01-03 ENCOUNTER — Other Ambulatory Visit (HOSPITAL_COMMUNITY)
Admission: RE | Admit: 2022-01-03 | Discharge: 2022-01-03 | Disposition: A | Discharge status: Home / Self Care | Payer: Medicare HMO | Source: Ambulatory Visit | Attending: Psychology | Admitting: Psychology

## 2022-01-03 DIAGNOSIS — Z79899 Other long term (current) drug therapy: Secondary | ICD-10-CM | POA: Diagnosis not present

## 2022-01-03 LAB — CBC WITH DIFFERENTIAL/PLATELET
Abs Immature Granulocytes: 0.02 10*3/uL (ref 0.00–0.07)
Basophils Absolute: 0 10*3/uL (ref 0.0–0.1)
Basophils Relative: 0 %
Eosinophils Absolute: 0 10*3/uL (ref 0.0–0.5)
Eosinophils Relative: 0 %
HCT: 40.4 % (ref 36.0–46.0)
Hemoglobin: 14.1 g/dL (ref 12.0–15.0)
Immature Granulocytes: 0 %
Lymphocytes Relative: 49 %
Lymphs Abs: 4.6 10*3/uL — ABNORMAL HIGH (ref 0.7–4.0)
MCH: 30.7 pg (ref 26.0–34.0)
MCHC: 34.9 g/dL (ref 30.0–36.0)
MCV: 87.8 fL (ref 80.0–100.0)
Monocytes Absolute: 0.7 10*3/uL (ref 0.1–1.0)
Monocytes Relative: 7 %
Neutro Abs: 4.2 10*3/uL (ref 1.7–7.7)
Neutrophils Relative %: 44 %
Platelets: 266 10*3/uL (ref 150–400)
RBC: 4.6 MIL/uL (ref 3.87–5.11)
RDW: 16.6 % — ABNORMAL HIGH (ref 11.5–15.5)
WBC: 9.6 10*3/uL (ref 4.0–10.5)
nRBC: 0 % (ref 0.0–0.2)

## 2022-01-07 ENCOUNTER — Encounter (INDEPENDENT_AMBULATORY_CARE_PROVIDER_SITE_OTHER): Payer: Self-pay | Admitting: Adult Health

## 2022-01-07 ENCOUNTER — Ambulatory Visit (INDEPENDENT_AMBULATORY_CARE_PROVIDER_SITE_OTHER): Payer: Medicare HMO | Admitting: Adult Health

## 2022-01-07 VITALS — BP 119/84 | HR 97 | Temp 98.2°F | Ht 63.0 in | Wt 272.0 lb

## 2022-01-07 DIAGNOSIS — R6889 Other general symptoms and signs: Secondary | ICD-10-CM | POA: Diagnosis not present

## 2022-01-07 DIAGNOSIS — E669 Obesity, unspecified: Secondary | ICD-10-CM | POA: Diagnosis not present

## 2022-01-07 DIAGNOSIS — R7303 Prediabetes: Secondary | ICD-10-CM | POA: Diagnosis not present

## 2022-01-07 DIAGNOSIS — Z6841 Body Mass Index (BMI) 40.0 and over, adult: Secondary | ICD-10-CM

## 2022-01-07 DIAGNOSIS — F3160 Bipolar disorder, current episode mixed, unspecified: Secondary | ICD-10-CM

## 2022-01-08 DIAGNOSIS — F429 Obsessive-compulsive disorder, unspecified: Secondary | ICD-10-CM | POA: Diagnosis not present

## 2022-01-08 DIAGNOSIS — R6889 Other general symptoms and signs: Secondary | ICD-10-CM | POA: Diagnosis not present

## 2022-01-08 DIAGNOSIS — F319 Bipolar disorder, unspecified: Secondary | ICD-10-CM | POA: Diagnosis not present

## 2022-01-10 NOTE — Progress Notes (Unsigned)
Chief Complaint:   OBESITY Brenda Hernandez is here to discuss her progress with her obesity treatment plan along with follow-up of her obesity related diagnoses. Brenda Hernandez is on the Category 2 Plan and states she is following her eating plan approximately 50% of the time. Brenda Hernandez states she is not exercising due to a broken knee.   Today's visit was #: 47 Starting weight: 287 lbs Starting date: 03/26/2020 Today's weight: 272 lbs Today's date: 01/07/2022 Total lbs lost to date: 15 lbs Total lbs lost since last in-office visit: 6 lbs  Interim History: Currently snacking on fruit (Cantaloupe, grapes, raspberries), vegetable (carrots with dip).  Bioimpedance results reviewed with patient.  Muscle mass +0.2 lbs, adipose mass -6.2 lbs Plan on *** log to next office visit.   Subjective:   1. Prediabetes A1c ***,  11/12/2021 office visit B. Alroy Dust DNP manages metformin 1000 mg BID with meals.   2. Bipolar affective disorder, current episode mixed, current episode severity unspecified ([place]) B. Alroy Dust DNP manages mental health medication Clozaril 100 mg, 5 tablets every night.  Depakene, 100m's at bedtime. Metformin 1000 mg BID with meals.  She denies any suicidal or homicidal ideations.   Assessment/Plan:   1. Prediabetes Continue metformin therapy per mental health provider.   2. Bipolar affective disorder, current episode mixed, current episode severity unspecified ([place] Follow up with B.Alroy DustDNP, 01/28/2022.  3. Obesity with current BMI 48.3 Handout Food tracking log  Brenda Hernandez is currently in the action stage of change. As such, her goal is to continue with weight loss efforts. She has agreed to the Category 2 Plan.   Exercise goals: All adults should avoid inactivity. Some physical activity is better than none, and adults who participate in any amount of physical activity gain some health benefits. Try some seated exercises, ask [employer] about transport to the gym.   Behavioral  modification strategies: increasing lean protein intake, decreasing simple carbohydrates, meal planning and cooking strategies, keeping healthy foods in the home, and planning for success.  Brenda Hernandez has agreed to follow-up with our clinic in 4 weeks. She was informed of the importance of frequent follow-up visits to maximize her success with intensive lifestyle modifications for her multiple health conditions.   Objective:   Blood pressure 119/84, pulse 97, temperature 98.2 F (36.8 C), height '5\' 3"'$  (1.6 m), weight 272 lb (123.4 kg), SpO2 98 %. Body mass index is 48.18 kg/m.  General: Cooperative, alert, well developed, in no acute distress. HEENT: Conjunctivae and lids unremarkable. Cardiovascular: Regular rhythm.  Lungs: Normal work of breathing. Neurologic: No focal deficits.   Lab Results  Component Value Date   CREATININE 0.93 11/01/2021   BUN 9 11/01/2021   NA 139 11/01/2021   K 4.1 11/01/2021   CL 106 11/01/2021   CO2 22 11/01/2021   Lab Results  Component Value Date   ALT 11 11/01/2021   AST 17 11/01/2021   ALKPHOS 66 11/01/2021   BILITOT 0.4 11/01/2021   Lab Results  Component Value Date   HGBA1C 5.2 11/01/2021   HGBA1C 5.6 06/28/2021   HGBA1C 5.6 06/18/2021   HGBA1C 5.5 12/28/2020   HGBA1C 5.5 12/05/2020   Lab Results  Component Value Date   INSULIN 14.1 06/18/2021   INSULIN 6.0 12/05/2020   INSULIN 10.8 07/18/2020   INSULIN 24.7 03/26/2020   Lab Results  Component Value Date   TSH 2.443 11/01/2021   Lab Results  Component Value Date   CHOL 193 11/01/2021   HDL  73 11/01/2021   LDLCALC 101 (H) 11/01/2021   TRIG 94 11/01/2021   CHOLHDL 2.6 11/01/2021   Lab Results  Component Value Date   VD25OH 52.44 12/28/2020   VD25OH 46.8 12/05/2020   VD25OH 91.02 08/03/2020   Lab Results  Component Value Date   WBC 9.6 01/03/2022   HGB 14.1 01/03/2022   HCT 40.4 01/03/2022   MCV 87.8 01/03/2022   PLT 266 01/03/2022   No results found for: "IRON",  "TIBC", "FERRITIN"  Attestation Statements:   Reviewed by clinician on day of visit: allergies, medications, problem list, medical history, surgical history, family history, social history, and previous encounter notes.  Time spent on visit including pre-visit chart review and post-visit care and charting was 26 minutes.   I, Davy Pique, RMA, am acting as Location manager for [name], NP.  I have reviewed the above documentation for accuracy and completeness, and I agree with the above. -  ***

## 2022-01-21 DIAGNOSIS — R6889 Other general symptoms and signs: Secondary | ICD-10-CM | POA: Diagnosis not present

## 2022-01-22 DIAGNOSIS — F319 Bipolar disorder, unspecified: Secondary | ICD-10-CM | POA: Diagnosis not present

## 2022-01-22 DIAGNOSIS — F429 Obsessive-compulsive disorder, unspecified: Secondary | ICD-10-CM | POA: Diagnosis not present

## 2022-01-22 DIAGNOSIS — R6889 Other general symptoms and signs: Secondary | ICD-10-CM | POA: Diagnosis not present

## 2022-01-23 DIAGNOSIS — R6889 Other general symptoms and signs: Secondary | ICD-10-CM | POA: Diagnosis not present

## 2022-01-28 DIAGNOSIS — R6889 Other general symptoms and signs: Secondary | ICD-10-CM | POA: Diagnosis not present

## 2022-01-28 DIAGNOSIS — F319 Bipolar disorder, unspecified: Secondary | ICD-10-CM | POA: Diagnosis not present

## 2022-01-28 DIAGNOSIS — F429 Obsessive-compulsive disorder, unspecified: Secondary | ICD-10-CM | POA: Diagnosis not present

## 2022-01-31 ENCOUNTER — Other Ambulatory Visit (HOSPITAL_COMMUNITY)
Admission: RE | Admit: 2022-01-31 | Discharge: 2022-01-31 | Disposition: A | Discharge status: Home / Self Care | Payer: Medicare HMO | Attending: Psychology | Admitting: Psychology

## 2022-01-31 DIAGNOSIS — Z79899 Other long term (current) drug therapy: Secondary | ICD-10-CM | POA: Insufficient documentation

## 2022-01-31 LAB — CBC WITH DIFFERENTIAL/PLATELET
Abs Immature Granulocytes: 0.01 10*3/uL (ref 0.00–0.07)
Basophils Absolute: 0 10*3/uL (ref 0.0–0.1)
Basophils Relative: 0 %
Eosinophils Absolute: 0 10*3/uL (ref 0.0–0.5)
Eosinophils Relative: 0 %
HCT: 37.2 % (ref 36.0–46.0)
Hemoglobin: 13.1 g/dL (ref 12.0–15.0)
Immature Granulocytes: 0 %
Lymphocytes Relative: 56 %
Lymphs Abs: 3.8 10*3/uL (ref 0.7–4.0)
MCH: 31.1 pg (ref 26.0–34.0)
MCHC: 35.2 g/dL (ref 30.0–36.0)
MCV: 88.4 fL (ref 80.0–100.0)
Monocytes Absolute: 0.6 10*3/uL (ref 0.1–1.0)
Monocytes Relative: 8 %
Neutro Abs: 2.5 10*3/uL (ref 1.7–7.7)
Neutrophils Relative %: 36 %
Platelets: 250 10*3/uL (ref 150–400)
RBC: 4.21 MIL/uL (ref 3.87–5.11)
RDW: 16.5 % — ABNORMAL HIGH (ref 11.5–15.5)
WBC: 6.9 10*3/uL (ref 4.0–10.5)
nRBC: 0 % (ref 0.0–0.2)

## 2022-02-02 DIAGNOSIS — F319 Bipolar disorder, unspecified: Secondary | ICD-10-CM | POA: Diagnosis not present

## 2022-02-02 DIAGNOSIS — F429 Obsessive-compulsive disorder, unspecified: Secondary | ICD-10-CM | POA: Diagnosis not present

## 2022-02-02 DIAGNOSIS — R6889 Other general symptoms and signs: Secondary | ICD-10-CM | POA: Diagnosis not present

## 2022-02-03 ENCOUNTER — Ambulatory Visit (INDEPENDENT_AMBULATORY_CARE_PROVIDER_SITE_OTHER): Payer: Medicare HMO | Admitting: Family Medicine

## 2022-02-03 ENCOUNTER — Encounter (INDEPENDENT_AMBULATORY_CARE_PROVIDER_SITE_OTHER): Payer: Self-pay

## 2022-02-03 ENCOUNTER — Encounter (INDEPENDENT_AMBULATORY_CARE_PROVIDER_SITE_OTHER): Payer: Self-pay | Admitting: Family Medicine

## 2022-02-03 VITALS — BP 128/91 | HR 103 | Temp 98.0°F | Ht 63.0 in | Wt 271.0 lb

## 2022-02-03 DIAGNOSIS — F319 Bipolar disorder, unspecified: Secondary | ICD-10-CM

## 2022-02-03 DIAGNOSIS — R7303 Prediabetes: Secondary | ICD-10-CM

## 2022-02-03 DIAGNOSIS — G8929 Other chronic pain: Secondary | ICD-10-CM

## 2022-02-03 DIAGNOSIS — Z6841 Body Mass Index (BMI) 40.0 and over, adult: Secondary | ICD-10-CM

## 2022-02-03 DIAGNOSIS — E669 Obesity, unspecified: Secondary | ICD-10-CM

## 2022-02-12 ENCOUNTER — Ambulatory Visit (INDEPENDENT_AMBULATORY_CARE_PROVIDER_SITE_OTHER): Payer: Medicare HMO | Admitting: Internal Medicine

## 2022-02-12 ENCOUNTER — Encounter: Payer: Self-pay | Admitting: Internal Medicine

## 2022-02-12 VITALS — BP 126/78 | HR 95 | Temp 98.2°F | Ht 63.0 in | Wt 272.0 lb

## 2022-02-12 DIAGNOSIS — R7303 Prediabetes: Secondary | ICD-10-CM

## 2022-02-12 DIAGNOSIS — R32 Unspecified urinary incontinence: Secondary | ICD-10-CM | POA: Diagnosis not present

## 2022-02-12 DIAGNOSIS — N926 Irregular menstruation, unspecified: Secondary | ICD-10-CM | POA: Insufficient documentation

## 2022-02-12 DIAGNOSIS — G8929 Other chronic pain: Secondary | ICD-10-CM | POA: Diagnosis not present

## 2022-02-12 DIAGNOSIS — M25562 Pain in left knee: Secondary | ICD-10-CM

## 2022-02-12 DIAGNOSIS — Z6841 Body Mass Index (BMI) 40.0 and over, adult: Secondary | ICD-10-CM

## 2022-02-12 DIAGNOSIS — F250 Schizoaffective disorder, bipolar type: Secondary | ICD-10-CM | POA: Diagnosis not present

## 2022-02-12 DIAGNOSIS — Z8742 Personal history of other diseases of the female genital tract: Secondary | ICD-10-CM | POA: Insufficient documentation

## 2022-02-12 DIAGNOSIS — R6889 Other general symptoms and signs: Secondary | ICD-10-CM | POA: Diagnosis not present

## 2022-02-12 DIAGNOSIS — N898 Other specified noninflammatory disorders of vagina: Secondary | ICD-10-CM | POA: Insufficient documentation

## 2022-02-12 MED ORDER — DIVALPROEX SODIUM ER 500 MG PO TB24: 500.0000 mg | ORAL_TABLET | Freq: Every day | ORAL | 5 refills | Status: DC

## 2022-02-12 NOTE — Assessment & Plan Note (Addendum)
Using a walker Trying to loose wt for surgery Brenda Hernandez needs to be <225 lbs, BMI <40 for knee surgery

## 2022-02-12 NOTE — Progress Notes (Signed)
Subjective:  Patient ID: Brenda Hernandez, female    DOB: 11/14/1975  Age: 46 y.o. MRN: [id number]  CC: Follow-up   HPI Brenda Hernandez presents for knee pain, obesity, pre-DM  Outpatient Medications Prior to Visit  Medication Sig Dispense Refill   B Complex-Folic Acid (B COMPLEX PLUS) TABS Take 1 tablet by mouth daily. 100 tablet 5   clomiPRAMINE (ANAFRANIL) 75 MG capsule Take 75 mg by mouth 2 (two) times daily.     cloZAPine (CLOZARIL) 100 MG tablet Take 5 tablets (500 mg total) by mouth daily. 150 tablet 2   fluticasone (FLONASE) 50 MCG/ACT nasal spray Place 1 spray into both nostrils daily. 16 g 6   ibuprofen (ADVIL) 400 MG tablet Take 1 tablet (400 mg total) by mouth every 8 (eight) hours as needed for moderate pain. 90 tablet 2   levonorgestrel-ethinyl estradiol (ALESSE) 0.1-20 MG-MCG tablet Sronyx 0.1 mg-20 mcg tablet  TAKE 1 TABLET BY MOUTH EVERY DAY     metFORMIN (GLUCOPHAGE) 1000 MG tablet Take 1,000 mg by mouth 2 (two) times daily with a meal.     ondansetron (ZOFRAN) 4 MG tablet Take 4 mg by mouth once.     promethazine (PHENERGAN) 6.25 MG/5ML syrup Take 6.25 mg by mouth every 4 (four) hours as needed.     Study - CAPTIVA - aspirin 81 mg tablet (PI-Sethi) Chew 1 tablet by mouth daily.     topiramate (TOPAMAX) 25 MG tablet Take 25 mg by mouth 2 (two) times daily.     cephALEXin (KEFLEX) 500 MG capsule Take 500 mg by mouth 4 (four) times daily.     divalproex (DEPAKOTE ER) 500 MG 24 hr tablet Take 500 mg by mouth daily.     valproic acid (DEPAKENE) 250 MG/5ML solution Take 5 mLs (250 mg total) by mouth at bedtime. 600 mL 2   No facility-administered medications prior to visit.    ROS: Review of Systems  Constitutional:  Negative for activity change, appetite change, chills, fatigue and unexpected weight change.  HENT:  Negative for congestion, mouth sores and sinus pressure.   Eyes:  Negative for visual disturbance.  Respiratory:  Negative for cough and chest tightness.    Gastrointestinal:  Negative for abdominal pain and nausea.  Genitourinary:  Negative for difficulty urinating, frequency and vaginal pain.  Musculoskeletal:  Positive for arthralgias and gait problem. Negative for back pain.  Skin:  Negative for pallor and rash.  Neurological:  Negative for dizziness, tremors, weakness, numbness and headaches.  Psychiatric/Behavioral:  Negative for confusion and sleep disturbance.     Objective:  BP 126/78 (BP Location: Right Arm, Patient Position: Sitting, Cuff Size: Normal)   Pulse 95   Temp 98.2 F (36.8 C) (Oral)   Ht '5\' 3"'$  (1.6 m)   Wt 272 lb (123.4 kg)   SpO2 99%   BMI 48.18 kg/m   BP Readings from Last 3 Encounters:  02/12/22 126/78  02/03/22 (!) 128/91  01/07/22 119/84    Wt Readings from Last 3 Encounters:  02/12/22 272 lb (123.4 kg)  02/03/22 271 lb (122.9 kg)  01/07/22 272 lb (123.4 kg)    Physical Exam Constitutional:      General: She is not in acute distress.    Appearance: She is well-developed. She is obese.  HENT:     Head: Normocephalic.     Right Ear: External ear normal.     Left Ear: External ear normal.     Nose: Nose normal.  Eyes:  General:        Right eye: No discharge.        Left eye: No discharge.     Conjunctiva/sclera: Conjunctivae normal.     Pupils: Pupils are equal, round, and reactive to light.  Neck:     Thyroid: No thyromegaly.     Vascular: No JVD.     Trachea: No tracheal deviation.  Cardiovascular:     Rate and Rhythm: Normal rate and regular rhythm.     Heart sounds: Normal heart sounds.  Pulmonary:     Effort: No respiratory distress.     Breath sounds: No stridor. No wheezing.  Abdominal:     General: Bowel sounds are normal. There is no distension.     Palpations: Abdomen is soft. There is no mass.     Tenderness: There is no abdominal tenderness. There is no guarding or rebound.  Musculoskeletal:        General: Tenderness present.     Cervical back: Normal range of motion  and neck supple. No rigidity.  Lymphadenopathy:     Cervical: No cervical adenopathy.  Skin:    Findings: No erythema or rash.  Neurological:     Mental Status: She is oriented to person, place, and time.     Cranial Nerves: No cranial nerve deficit.     Motor: No abnormal muscle tone.     Coordination: Coordination normal.     Gait: Gait abnormal.     Deep Tendon Reflexes: Reflexes normal.  Psychiatric:        Behavior: Behavior normal.        Thought Content: Thought content normal.        Judgment: Judgment normal.   Using a walker  Lab Results  Component Value Date   WBC 6.9 01/31/2022   HGB 13.1 01/31/2022   HCT 37.2 01/31/2022   PLT 250 01/31/2022   GLUCOSE 91 11/01/2021   CHOL 193 11/01/2021   TRIG 94 11/01/2021   HDL 73 11/01/2021   LDLCALC 101 (H) 11/01/2021   ALT 11 11/01/2021   AST 17 11/01/2021   NA 139 11/01/2021   K 4.1 11/01/2021   CL 106 11/01/2021   CREATININE 0.93 11/01/2021   BUN 9 11/01/2021   CO2 22 11/01/2021   TSH 2.443 11/01/2021   HGBA1C 5.2 11/01/2021    No results found.  Assessment & Plan:   Problem List Items Addressed This Visit     Class 3 severe obesity with serious comorbidity and body mass index (BMI) of 50.0 to 59.9 in adult ([place]) - Primary    On diet, Metformin Cont to f/u w/[facility] q 3 wks      Incontinence in female    New Episodic - likely Rx related Check UA      Knee pain, left    Using a walker Trying to loose wt for surgery Brenda Hernandez needs to be <225 lbs, BMI <40 for knee surgery      Pre-diabetes    On Metformin Trying to loose wt for surgery Brenda Hernandez needs to be <225 lbs, BMI <40 for knee surgery      Relevant Orders   Urinalysis   Schizoaffective disorder ([place])    Depakane d/c due to teeth problems On Depakote ER now         Meds ordered this encounter  Medications   divalproex (DEPAKOTE ER) 500 MG 24 hr tablet    Sig: Take 1 tablet (500 mg total) by  mouth daily.    Dispense:   30 tablet    Refill:  5      Follow-up: Return in about 3 months (around 05/14/2022) for a follow-up visit.  Walker Kehr, MD

## 2022-02-12 NOTE — Assessment & Plan Note (Addendum)
On diet, Metformin Cont to f/u w/[facility] q 3 wks

## 2022-02-12 NOTE — Assessment & Plan Note (Signed)
Depakane d/c due to teeth problems On Depakote ER now

## 2022-02-12 NOTE — Assessment & Plan Note (Signed)
On Metformin Trying to loose wt for surgery Brenda Hernandez needs to be <225 lbs, BMI <40 for knee surgery

## 2022-02-12 NOTE — Assessment & Plan Note (Signed)
New Episodic - likely Rx related Check UA

## 2022-02-17 NOTE — Progress Notes (Signed)
Chief Complaint:   OBESITY Brenda Hernandez is here to discuss her progress with her obesity treatment plan along with follow-up of her obesity related diagnoses. Brenda Hernandez is on the Category 2 Plan and states she is following her eating plan approximately 25% of the time. Brenda Hernandez states she is doing strength training and using her exercise bike 35 minutes 1-2 times per week.  Today's visit was #: 44 Starting weight: 287 lbs Starting date: 03/26/2020 Today's weight: 271 lbs Today's date: 02/03/2022 Total lbs lost to date: 16 lbs Total lbs lost since last in-office visit: 1 lb  Interim History: She tends to skip breakfast, (gets up late).  Eats a pack of Ramen with mushrooms and butter.  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  Eats berries for a snack at night.  Typically stay up late but does not feel hungry.  She denies using SSB's intake.  Exercise is limited by bilateral knee pain and lower back pain.   Subjective:   1. Pre-diabetes She is taking metformin 1000 mg daily (not BID) per psych.  She denies GI upset.  Denies carb or sugar cravings.   2. Other chronic pain Chronic pain has been a barrier for increasing exercise.  Has transportation issues getting to the gym and lacks at home options.  Motivation for making change is low.   3. Bipolar affective disorder, remission status unspecified ([place]) Changed to Depakote 500 mg 4 tablets once a day with food.  Managed by Sharon Seller, NP.  Mood stable.    Assessment/Plan:   1. Pre-diabetes Continue metformin as prescribed. Reduce intake of refined carbohydrates and added sugar.  2. Other chronic pain Continue plan of car per PCP.  Look for improvements in weight bearing joint pain with weight loss.  3. Bipolar affective disorder, remission status unspecified ([place]) Continue plan of care per PCP. Continue to practice mindful eating and good sleep hygiene.  4. Obesity, current BMI 48.0 1) Reviewed overall  progress:  decreased 16 lbs in  10 months.   2) Reviewed pateints current motivation level to make behavior changes.   Brenda Hernandez is currently in the action stage of change. As such, her goal is to continue with weight loss efforts. She has agreed to the Category 2 Plan.   Exercise goals: All adults should avoid inactivity. Some physical activity is better than none, and adults who participate in any amount of physical activity gain some health benefits.  Behavioral modification strategies: increasing lean protein intake, increasing vegetables, increasing water intake, no skipping meals, meal planning and cooking strategies, keeping healthy foods in the home, and decreasing junk food.  Brenda Hernandez has agreed to follow-up with our clinic in 5 weeks. She was informed of the importance of frequent follow-up visits to maximize her success with intensive lifestyle modifications for her multiple health conditions.   Objective:   Blood pressure (!) 128/91, pulse (!) 103, temperature 98 F (36.7 C), height '5\' 3"'$  (1.6 m), weight 271 lb (122.9 kg), SpO2 99 %. Body mass index is 48.01 kg/m.  General: Cooperative, alert, well developed, in no acute distress. HEENT: Conjunctivae and lids unremarkable. Cardiovascular: Regular rhythm.  Lungs: Normal work of breathing. Neurologic: No focal deficits.   Lab Results  Component Value Date   CREATININE 0.93 11/01/2021   BUN 9 11/01/2021   NA 139 11/01/2021   K 4.1 11/01/2021   CL 106 11/01/2021   CO2 22 11/01/2021   Lab Results  Component Value Date  ALT 11 11/01/2021   AST 17 11/01/2021   ALKPHOS 66 11/01/2021   BILITOT 0.4 11/01/2021   Lab Results  Component Value Date   HGBA1C 5.2 11/01/2021   HGBA1C 5.6 06/28/2021   HGBA1C 5.6 06/18/2021   HGBA1C 5.5 12/28/2020   HGBA1C 5.5 12/05/2020   Lab Results  Component Value Date   INSULIN 14.1 06/18/2021   INSULIN 6.0 12/05/2020   INSULIN 10.8 07/18/2020   INSULIN 24.7 03/26/2020   Lab Results   Component Value Date   TSH 2.443 11/01/2021   Lab Results  Component Value Date   CHOL 193 11/01/2021   HDL 73 11/01/2021   LDLCALC 101 (H) 11/01/2021   TRIG 94 11/01/2021   CHOLHDL 2.6 11/01/2021   Lab Results  Component Value Date   VD25OH 52.44 12/28/2020   VD25OH 46.8 12/05/2020   VD25OH 91.02 08/03/2020   Lab Results  Component Value Date   WBC 6.9 01/31/2022   HGB 13.1 01/31/2022   HCT 37.2 01/31/2022   MCV 88.4 01/31/2022   PLT 250 01/31/2022   No results found for: "IRON", "TIBC", "FERRITIN"  Attestation Statements:   Reviewed by clinician on day of visit: allergies, medications, problem list, medical history, surgical history, family history, social history, and previous encounter notes.  I, Davy Pique, am acting as Location manager for Loyal Gambler, DO.  I have reviewed the above documentation for accuracy and completeness, and I agree with the above. Dell Ponto, DO

## 2022-02-18 DIAGNOSIS — R6889 Other general symptoms and signs: Secondary | ICD-10-CM | POA: Diagnosis not present

## 2022-02-19 DIAGNOSIS — F429 Obsessive-compulsive disorder, unspecified: Secondary | ICD-10-CM | POA: Diagnosis not present

## 2022-02-19 DIAGNOSIS — R6889 Other general symptoms and signs: Secondary | ICD-10-CM | POA: Diagnosis not present

## 2022-02-19 DIAGNOSIS — F319 Bipolar disorder, unspecified: Secondary | ICD-10-CM | POA: Diagnosis not present

## 2022-02-20 DIAGNOSIS — R6889 Other general symptoms and signs: Secondary | ICD-10-CM | POA: Diagnosis not present

## 2022-02-25 DIAGNOSIS — R6889 Other general symptoms and signs: Secondary | ICD-10-CM | POA: Diagnosis not present

## 2022-02-26 DIAGNOSIS — F429 Obsessive-compulsive disorder, unspecified: Secondary | ICD-10-CM | POA: Diagnosis not present

## 2022-02-26 DIAGNOSIS — F319 Bipolar disorder, unspecified: Secondary | ICD-10-CM | POA: Diagnosis not present

## 2022-02-26 DIAGNOSIS — R6889 Other general symptoms and signs: Secondary | ICD-10-CM | POA: Diagnosis not present

## 2022-02-27 ENCOUNTER — Other Ambulatory Visit (HOSPITAL_COMMUNITY)
Admission: AD | Admit: 2022-02-27 | Discharge: 2022-02-27 | Disposition: A | Discharge status: Home / Self Care | Payer: Medicare HMO | Source: Ambulatory Visit | Attending: Psychiatry | Admitting: Psychiatry

## 2022-02-27 DIAGNOSIS — Z79899 Other long term (current) drug therapy: Secondary | ICD-10-CM | POA: Insufficient documentation

## 2022-02-27 LAB — CBC WITH DIFFERENTIAL/PLATELET
Abs Immature Granulocytes: 0.02 10*3/uL (ref 0.00–0.07)
Basophils Absolute: 0 10*3/uL (ref 0.0–0.1)
Basophils Relative: 0 %
Eosinophils Absolute: 0 10*3/uL (ref 0.0–0.5)
Eosinophils Relative: 0 %
HCT: 35.7 % — ABNORMAL LOW (ref 36.0–46.0)
Hemoglobin: 12.7 g/dL (ref 12.0–15.0)
Immature Granulocytes: 0 %
Lymphocytes Relative: 50 %
Lymphs Abs: 3.4 10*3/uL (ref 0.7–4.0)
MCH: 31.1 pg (ref 26.0–34.0)
MCHC: 35.6 g/dL (ref 30.0–36.0)
MCV: 87.5 fL (ref 80.0–100.0)
Monocytes Absolute: 0.5 10*3/uL (ref 0.1–1.0)
Monocytes Relative: 8 %
Neutro Abs: 2.8 10*3/uL (ref 1.7–7.7)
Neutrophils Relative %: 42 %
Platelets: 258 10*3/uL (ref 150–400)
RBC: 4.08 MIL/uL (ref 3.87–5.11)
RDW: 16.5 % — ABNORMAL HIGH (ref 11.5–15.5)
WBC: 6.8 10*3/uL (ref 4.0–10.5)
nRBC: 0 % (ref 0.0–0.2)

## 2022-03-06 DIAGNOSIS — R6889 Other general symptoms and signs: Secondary | ICD-10-CM | POA: Diagnosis not present

## 2022-03-11 ENCOUNTER — Ambulatory Visit (INDEPENDENT_AMBULATORY_CARE_PROVIDER_SITE_OTHER): Payer: Medicare HMO | Admitting: Physician Assistant

## 2022-03-11 ENCOUNTER — Encounter (INDEPENDENT_AMBULATORY_CARE_PROVIDER_SITE_OTHER): Payer: Self-pay | Admitting: Physician Assistant

## 2022-03-11 VITALS — BP 127/86 | HR 102 | Temp 98.2°F | Ht 63.0 in | Wt 271.0 lb

## 2022-03-11 DIAGNOSIS — Z6841 Body Mass Index (BMI) 40.0 and over, adult: Secondary | ICD-10-CM | POA: Diagnosis not present

## 2022-03-11 DIAGNOSIS — E669 Obesity, unspecified: Secondary | ICD-10-CM

## 2022-03-11 DIAGNOSIS — R7303 Prediabetes: Secondary | ICD-10-CM

## 2022-03-11 DIAGNOSIS — F319 Bipolar disorder, unspecified: Secondary | ICD-10-CM

## 2022-03-19 DIAGNOSIS — Z01411 Encounter for gynecological examination (general) (routine) with abnormal findings: Secondary | ICD-10-CM | POA: Diagnosis not present

## 2022-03-19 DIAGNOSIS — N6452 Nipple discharge: Secondary | ICD-10-CM | POA: Diagnosis not present

## 2022-03-19 DIAGNOSIS — Z309 Encounter for contraceptive management, unspecified: Secondary | ICD-10-CM | POA: Diagnosis not present

## 2022-03-19 DIAGNOSIS — Z1231 Encounter for screening mammogram for malignant neoplasm of breast: Secondary | ICD-10-CM | POA: Diagnosis not present

## 2022-03-19 DIAGNOSIS — R6889 Other general symptoms and signs: Secondary | ICD-10-CM | POA: Diagnosis not present

## 2022-03-19 DIAGNOSIS — Z6841 Body Mass Index (BMI) 40.0 and over, adult: Secondary | ICD-10-CM | POA: Diagnosis not present

## 2022-03-19 DIAGNOSIS — R87610 Atypical squamous cells of undetermined significance on cytologic smear of cervix (ASC-US): Secondary | ICD-10-CM | POA: Diagnosis not present

## 2022-03-25 NOTE — Progress Notes (Unsigned)
Chief Complaint:   OBESITY Brenda Hernandez is here to discuss her progress with her obesity treatment plan along with follow-up of her obesity related diagnoses. Brenda Hernandez is on the Category 2 Plan and states she is following her eating plan approximately 30% of the time. Brenda Hernandez states she is going to the gym 45-60 minutes 1-2 times per week.  Today's visit was #: 8 Starting weight: 287 lbs Starting date: 03/26/2020 Today's weight: 271 lbs Today's date: 03/11/2022 Total lbs lost to date: 6 lbs Total lbs lost since last in-office visit: 0  Interim History: "I did well until holidays-" she reports she generally eats only 1 main meal daily. Does not eat much during the day--gets up at 10am if she has to go to the gym which is 1-2 times weekly.  She otherwise gets up much later during the day and usually does not go to bed until about 2 AM.  Breakfast- she skips most of time unless going to the gym.  She reports she snacks on berries through the day after she gets up. Then she normally eats much later at 11pm-Ramen noodles/greens-then berries.  Does not really get hungry consistently during the day, and again only eats 1 main meal at night which does not meet her protein or calorie goals.  We discussed the importance of getting more protein and calories throughout the day in order to maintain her metabolic rate to help promote weight loss.  Subjective:   1. Pre-diabetes On 11/01/2021, A1c was 5.2- decreased from previous lab-She is on metformin 1000 mg twice a day per patient.  Denies any side effects.  Working on decreasing simple carbs, increasing protein to promote weight loss.  2. Bipolar affective disorder, remission status unspecified (Brenda Hernandez) Medications per psychiatry-Brittany Alroy Dust, NP.  Mood is stable.  Assessment/Plan:   1. Pre-diabetes Continue Metformin, prescribed nutrition plan to decrease simple carbohydrates, increase lean protein and exercise to promote weight loss.  We  discussed the importance of getting consistent protein and calories for her overall health, metabolic rate, and for promotion of weight loss.  2. Bipolar affective disorder, remission status unspecified (Brenda Hernandez) Continue plan of care per PCP and psychiatrist.  3. Obesity, current BMI 48.1 Brenda Hernandez is currently in the action stage of change. As such, her goal is to continue with weight loss efforts. She has agreed to the Category 2 Plan.   Exercise goals: As is.  Behavioral modification strategies: increasing lean protein intake, decreasing simple carbohydrates, increasing water intake, keeping healthy foods in the home, and planning for success.  Brenda Hernandez has agreed to follow-up with our clinic in 4 weeks. She was informed of the importance of frequent follow-up visits to maximize her success with intensive lifestyle modifications for her multiple health conditions.   Objective:   Blood pressure 127/86, pulse (!) 102, temperature 98.2 F (36.8 C), height '5\' 3"'$  (1.6 m), weight 271 lb (122.9 kg), SpO2 95 %. Body mass index is 48.01 kg/m.  General: Cooperative, alert, well developed, in no acute distress. HEENT: Conjunctivae and lids unremarkable. Cardiovascular: Regular rhythm.  Lungs: Normal work of breathing. Neurologic: No focal deficits.   Lab Results  Component Value Date   CREATININE 0.93 11/01/2021   BUN 9 11/01/2021   NA 139 11/01/2021   K 4.1 11/01/2021   CL 106 11/01/2021   CO2 22 11/01/2021   Lab Results  Component Value Date   ALT 11 11/01/2021   AST 17 11/01/2021   ALKPHOS 66 11/01/2021   BILITOT  0.4 11/01/2021   Lab Results  Component Value Date   HGBA1C 5.2 11/01/2021   HGBA1C 5.6 06/28/2021   HGBA1C 5.6 06/18/2021   HGBA1C 5.5 12/28/2020   HGBA1C 5.5 12/05/2020   Lab Results  Component Value Date   INSULIN 14.1 06/18/2021   INSULIN 6.0 12/05/2020   INSULIN 10.8 07/18/2020   INSULIN 24.7 03/26/2020   Lab Results  Component Value Date   TSH 2.443  11/01/2021   Lab Results  Component Value Date   CHOL 193 11/01/2021   HDL 73 11/01/2021   LDLCALC 101 (H) 11/01/2021   TRIG 94 11/01/2021   CHOLHDL 2.6 11/01/2021   Lab Results  Component Value Date   VD25OH 52.44 12/28/2020   VD25OH 46.8 12/05/2020   VD25OH 91.02 08/03/2020   Lab Results  Component Value Date   WBC 6.8 02/27/2022   HGB 12.7 02/27/2022   HCT 35.7 (L) 02/27/2022   MCV 87.5 02/27/2022   PLT 258 02/27/2022   No results found for: "IRON", "TIBC", "FERRITIN"  Attestation Statements:   Reviewed by clinician on day of visit: allergies, medications, problem list, medical history, surgical history, family history, social history, and previous encounter notes.  I, Brendell Tyus, am acting as transcriptionist for [employer], PA.  I have reviewed the above documentation for accuracy and completeness, and I agree with the above. -  Lorrin Bodner,PA-C

## 2022-04-02 DIAGNOSIS — R6889 Other general symptoms and signs: Secondary | ICD-10-CM | POA: Diagnosis not present

## 2022-04-02 DIAGNOSIS — F319 Bipolar disorder, unspecified: Secondary | ICD-10-CM | POA: Diagnosis not present

## 2022-04-04 ENCOUNTER — Other Ambulatory Visit (HOSPITAL_COMMUNITY)
Admission: RE | Admit: 2022-04-04 | Discharge: 2022-04-04 | Disposition: A | Discharge status: Home / Self Care | Payer: Medicare HMO | Source: Ambulatory Visit | Attending: Psychology | Admitting: Psychology

## 2022-04-04 DIAGNOSIS — Z79899 Other long term (current) drug therapy: Secondary | ICD-10-CM | POA: Diagnosis not present

## 2022-04-04 LAB — CBC WITH DIFFERENTIAL/PLATELET
Abs Immature Granulocytes: 0.02 10*3/uL (ref 0.00–0.07)
Basophils Absolute: 0 10*3/uL (ref 0.0–0.1)
Basophils Relative: 0 %
Eosinophils Absolute: 0 10*3/uL (ref 0.0–0.5)
Eosinophils Relative: 0 %
HCT: 41 % (ref 36.0–46.0)
Hemoglobin: 14.4 g/dL (ref 12.0–15.0)
Immature Granulocytes: 0 %
Lymphocytes Relative: 61 %
Lymphs Abs: 5.2 10*3/uL — ABNORMAL HIGH (ref 0.7–4.0)
MCH: 31.2 pg (ref 26.0–34.0)
MCHC: 35.1 g/dL (ref 30.0–36.0)
MCV: 88.9 fL (ref 80.0–100.0)
Monocytes Absolute: 0.5 10*3/uL (ref 0.1–1.0)
Monocytes Relative: 6 %
Neutro Abs: 2.8 10*3/uL (ref 1.7–7.7)
Neutrophils Relative %: 33 %
Platelets: 270 10*3/uL (ref 150–400)
RBC: 4.61 MIL/uL (ref 3.87–5.11)
RDW: 17.2 % — ABNORMAL HIGH (ref 11.5–15.5)
WBC: 8.6 10*3/uL (ref 4.0–10.5)
nRBC: 0 % (ref 0.0–0.2)

## 2022-04-06 ENCOUNTER — Other Ambulatory Visit: Payer: Self-pay | Admitting: Obstetrics & Gynecology

## 2022-04-06 DIAGNOSIS — N6452 Nipple discharge: Secondary | ICD-10-CM

## 2022-04-13 ENCOUNTER — Ambulatory Visit (INDEPENDENT_AMBULATORY_CARE_PROVIDER_SITE_OTHER): Payer: Medicare HMO | Admitting: Family Medicine

## 2022-04-13 ENCOUNTER — Encounter (INDEPENDENT_AMBULATORY_CARE_PROVIDER_SITE_OTHER): Payer: Self-pay | Admitting: Family Medicine

## 2022-04-13 VITALS — BP 120/84 | HR 97 | Temp 98.5°F | Ht 63.0 in

## 2022-04-13 DIAGNOSIS — F258 Other schizoaffective disorders: Secondary | ICD-10-CM

## 2022-04-13 DIAGNOSIS — G8929 Other chronic pain: Secondary | ICD-10-CM

## 2022-04-13 DIAGNOSIS — E669 Obesity, unspecified: Secondary | ICD-10-CM

## 2022-04-13 DIAGNOSIS — M25562 Pain in left knee: Secondary | ICD-10-CM | POA: Diagnosis not present

## 2022-04-13 DIAGNOSIS — M25561 Pain in right knee: Secondary | ICD-10-CM | POA: Diagnosis not present

## 2022-04-13 DIAGNOSIS — Z6841 Body Mass Index (BMI) 40.0 and over, adult: Secondary | ICD-10-CM

## 2022-04-13 DIAGNOSIS — R7303 Prediabetes: Secondary | ICD-10-CM

## 2022-04-13 DIAGNOSIS — R6889 Other general symptoms and signs: Secondary | ICD-10-CM | POA: Diagnosis not present

## 2022-04-16 DIAGNOSIS — F319 Bipolar disorder, unspecified: Secondary | ICD-10-CM | POA: Diagnosis not present

## 2022-04-16 DIAGNOSIS — R6889 Other general symptoms and signs: Secondary | ICD-10-CM | POA: Diagnosis not present

## 2022-04-16 DIAGNOSIS — F429 Obsessive-compulsive disorder, unspecified: Secondary | ICD-10-CM | POA: Diagnosis not present

## 2022-04-21 ENCOUNTER — Other Ambulatory Visit: Payer: Medicare HMO

## 2022-04-23 DIAGNOSIS — F319 Bipolar disorder, unspecified: Secondary | ICD-10-CM | POA: Diagnosis not present

## 2022-04-23 DIAGNOSIS — R6889 Other general symptoms and signs: Secondary | ICD-10-CM | POA: Diagnosis not present

## 2022-04-23 NOTE — Progress Notes (Signed)
Chief Complaint:   OBESITY Brenda Hernandez is here to discuss her progress with her obesity treatment plan along with follow-up of her obesity related diagnoses. Brenda Hernandez is on the Category 2 Plan and states she is following her eating plan approximately 25% of the time. Brenda Hernandez states she is doing strength exercises for 5-10 minutes 7 times per week.  Today's visit was #: 11 Starting weight: 287 lbs Starting date: 03/26/2020 Today's weight: 266 lbs Today's date: 04/13/2022 Total lbs lost to date: 21 Total lbs lost since last in-office visit: -5  Interim History: Net weight loss: 21 pounds in 2 years equals 7.3% total body weight loss. Skips breakfast.  1 meal a day is dinner-Ramen noodles with mushrooms and butter.  Sometimes adds green beans or peas.  Snacks on berries.  Drinking sparkling ICE. Sprite or orange juice when eating out 1 time per week.  Denies sugar cravings.  Has had doughnuts.  Exercise limited by knee pain.  Added 3-4 eggs per day. Subjective:   1. Bilateral chronic knee pain Has seen Ortho and needs to be less than 225 pounds in order to have surgery. Using walker to ambulate. Uses ibuprofen as needed for pain.  Notably, her knee pain is contributed to weight gain with lack of walking.  2. Pre-diabetes 11/01/2021 A1c 5.2.  On metformin 1000 mg twice daily per psychiatry. Continues to consume a high carb diet with lack of exercise.  3. Other schizoaffective disorders ([place]) Stable mood, managed by Keturah Shavers, DNP. On Depakote ER 500 mg daily. Has good support system.  Assessment/Plan:   1. Bilateral chronic knee pain Consider adding water exercise-discussed local options.  2. Pre-diabetes Continue to work on low sugar/lower carb diet.  3. Other schizoaffective disorders ([place]) Continue current meds per psychiatry. Her mood disorder and mood medications have contributed to her slow progress with obesity treatment.  4. Obesity, current BMI 47.3 1.   Reviewed progress: Up 0.8 pounds of muscle mass, down 5.8 pounds body fat in 4 weeks.  Brenda Hernandez is currently in the action stage of change. As such, her goal is to continue with weight loss efforts. She has agreed to the Category 2 Plan.   Exercise goals: All adults should avoid inactivity. Some physical activity is better than none, and adults who participate in any amount of physical activity gain some health benefits.  Behavioral modification strategies: increasing lean protein intake, increasing vegetables, increasing water intake, decreasing liquid calories, decreasing alcohol intake, meal planning and cooking strategies, keeping healthy foods in the home, better snacking choices, avoiding temptations, and decreasing junk food.  Brenda Hernandez has agreed to follow-up with our clinic in 4 weeks. She was informed of the importance of frequent follow-up visits to maximize her success with intensive lifestyle modifications for her multiple health conditions.    Objective:   Blood pressure 120/84, pulse 97, temperature 98.5 F (36.9 C), height 5' 3"$  (1.6 m), SpO2 100 %. Body mass index is 48.01 kg/m.  General: Cooperative, alert, well developed, in no acute distress. HEENT: Conjunctivae and lids unremarkable. Cardiovascular: Regular rhythm.  Lungs: Normal work of breathing. Neurologic: No focal deficits.   Lab Results  Component Value Date   CREATININE 0.93 11/01/2021   BUN 9 11/01/2021   NA 139 11/01/2021   K 4.1 11/01/2021   CL 106 11/01/2021   CO2 22 11/01/2021   Lab Results  Component Value Date   ALT 11 11/01/2021   AST 17 11/01/2021   ALKPHOS 66 11/01/2021  BILITOT 0.4 11/01/2021   Lab Results  Component Value Date   HGBA1C 5.2 11/01/2021   HGBA1C 5.6 06/28/2021   HGBA1C 5.6 06/18/2021   HGBA1C 5.5 12/28/2020   HGBA1C 5.5 12/05/2020   Lab Results  Component Value Date   INSULIN 14.1 06/18/2021   INSULIN 6.0 12/05/2020   INSULIN 10.8 07/18/2020   INSULIN 24.7  03/26/2020   Lab Results  Component Value Date   TSH 2.443 11/01/2021   Lab Results  Component Value Date   CHOL 193 11/01/2021   HDL 73 11/01/2021   LDLCALC 101 (H) 11/01/2021   TRIG 94 11/01/2021   CHOLHDL 2.6 11/01/2021   Lab Results  Component Value Date   VD25OH 52.44 12/28/2020   VD25OH 46.8 12/05/2020   VD25OH 91.02 08/03/2020   Lab Results  Component Value Date   WBC 8.6 04/04/2022   HGB 14.4 04/04/2022   HCT 41.0 04/04/2022   MCV 88.9 04/04/2022   PLT 270 04/04/2022   No results found for: "IRON", "TIBC", "FERRITIN"   Attestation Statements:   Reviewed by clinician on day of visit: allergies, medications, problem list, medical history, surgical history, family history, social history, and previous encounter notes.  I have personally spent 30 minutes total time today in preparation, patient care, nutritional counseling and documentation for this visit, including the following: review of clinical lab tests; review of medical tests/procedures/services.    I, Georgianne Fick, FNP, am acting as transcriptionist for Dr. Loyal Gambler.  I have reviewed the above documentation for accuracy and completeness, and I agree with the above. Dell Ponto, DO

## 2022-05-01 ENCOUNTER — Other Ambulatory Visit (HOSPITAL_COMMUNITY)
Admission: AD | Admit: 2022-05-01 | Discharge: 2022-05-01 | Disposition: A | Discharge status: Home / Self Care | Payer: Medicare HMO | Attending: Psychology | Admitting: Psychology

## 2022-05-01 DIAGNOSIS — Z79899 Other long term (current) drug therapy: Secondary | ICD-10-CM | POA: Diagnosis not present

## 2022-05-01 LAB — CBC WITH DIFFERENTIAL/PLATELET
Abs Immature Granulocytes: 0.01 10*3/uL (ref 0.00–0.07)
Basophils Absolute: 0 10*3/uL (ref 0.0–0.1)
Basophils Relative: 0 %
Eosinophils Absolute: 0 10*3/uL (ref 0.0–0.5)
Eosinophils Relative: 0 %
HCT: 37.8 % (ref 36.0–46.0)
Hemoglobin: 13.2 g/dL (ref 12.0–15.0)
Immature Granulocytes: 0 %
Lymphocytes Relative: 51 %
Lymphs Abs: 3.2 10*3/uL (ref 0.7–4.0)
MCH: 30.7 pg (ref 26.0–34.0)
MCHC: 34.9 g/dL (ref 30.0–36.0)
MCV: 87.9 fL (ref 80.0–100.0)
Monocytes Absolute: 0.4 10*3/uL (ref 0.1–1.0)
Monocytes Relative: 6 %
Neutro Abs: 2.7 10*3/uL (ref 1.7–7.7)
Neutrophils Relative %: 43 %
Platelets: 283 10*3/uL (ref 150–400)
RBC: 4.3 MIL/uL (ref 3.87–5.11)
RDW: 16.6 % — ABNORMAL HIGH (ref 11.5–15.5)
WBC: 6.2 10*3/uL (ref 4.0–10.5)
nRBC: 0 % (ref 0.0–0.2)

## 2022-05-07 DIAGNOSIS — F319 Bipolar disorder, unspecified: Secondary | ICD-10-CM | POA: Diagnosis not present

## 2022-05-07 DIAGNOSIS — R6889 Other general symptoms and signs: Secondary | ICD-10-CM | POA: Diagnosis not present

## 2022-05-13 ENCOUNTER — Encounter (INDEPENDENT_AMBULATORY_CARE_PROVIDER_SITE_OTHER): Payer: Self-pay | Admitting: Family Medicine

## 2022-05-13 ENCOUNTER — Ambulatory Visit (INDEPENDENT_AMBULATORY_CARE_PROVIDER_SITE_OTHER): Payer: Medicare HMO | Admitting: Family Medicine

## 2022-05-13 VITALS — BP 124/84 | HR 98 | Temp 97.8°F | Ht 63.0 in | Wt 260.0 lb

## 2022-05-13 DIAGNOSIS — E669 Obesity, unspecified: Secondary | ICD-10-CM | POA: Diagnosis not present

## 2022-05-13 DIAGNOSIS — R632 Polyphagia: Secondary | ICD-10-CM | POA: Diagnosis not present

## 2022-05-13 DIAGNOSIS — Z6841 Body Mass Index (BMI) 40.0 and over, adult: Secondary | ICD-10-CM | POA: Diagnosis not present

## 2022-05-13 DIAGNOSIS — R6889 Other general symptoms and signs: Secondary | ICD-10-CM | POA: Diagnosis not present

## 2022-05-14 DIAGNOSIS — F319 Bipolar disorder, unspecified: Secondary | ICD-10-CM | POA: Diagnosis not present

## 2022-05-14 DIAGNOSIS — F429 Obsessive-compulsive disorder, unspecified: Secondary | ICD-10-CM | POA: Diagnosis not present

## 2022-05-21 DIAGNOSIS — R6889 Other general symptoms and signs: Secondary | ICD-10-CM | POA: Diagnosis not present

## 2022-05-21 DIAGNOSIS — F319 Bipolar disorder, unspecified: Secondary | ICD-10-CM | POA: Diagnosis not present

## 2022-05-28 ENCOUNTER — Other Ambulatory Visit: Payer: Medicare HMO

## 2022-05-28 DIAGNOSIS — F429 Obsessive-compulsive disorder, unspecified: Secondary | ICD-10-CM | POA: Diagnosis not present

## 2022-05-28 DIAGNOSIS — R6889 Other general symptoms and signs: Secondary | ICD-10-CM | POA: Diagnosis not present

## 2022-05-28 DIAGNOSIS — F319 Bipolar disorder, unspecified: Secondary | ICD-10-CM | POA: Diagnosis not present

## 2022-05-29 ENCOUNTER — Other Ambulatory Visit (HOSPITAL_COMMUNITY)
Admission: AD | Admit: 2022-05-29 | Discharge: 2022-05-29 | Disposition: A | Discharge status: Home / Self Care | Payer: Medicare HMO | Attending: Psychology | Admitting: Psychology

## 2022-05-29 DIAGNOSIS — E669 Obesity, unspecified: Secondary | ICD-10-CM | POA: Diagnosis not present

## 2022-05-29 DIAGNOSIS — R632 Polyphagia: Secondary | ICD-10-CM | POA: Insufficient documentation

## 2022-05-29 DIAGNOSIS — Z6841 Body Mass Index (BMI) 40.0 and over, adult: Secondary | ICD-10-CM | POA: Diagnosis not present

## 2022-05-29 LAB — CBC WITH DIFFERENTIAL/PLATELET
Abs Immature Granulocytes: 0.01 10*3/uL (ref 0.00–0.07)
Basophils Absolute: 0 10*3/uL (ref 0.0–0.1)
Basophils Relative: 0 %
Eosinophils Absolute: 0 10*3/uL (ref 0.0–0.5)
Eosinophils Relative: 0 %
HCT: 37 % (ref 36.0–46.0)
Hemoglobin: 13 g/dL (ref 12.0–15.0)
Immature Granulocytes: 0 %
Lymphocytes Relative: 56 %
Lymphs Abs: 4.2 10*3/uL — ABNORMAL HIGH (ref 0.7–4.0)
MCH: 31.2 pg (ref 26.0–34.0)
MCHC: 35.1 g/dL (ref 30.0–36.0)
MCV: 88.7 fL (ref 80.0–100.0)
Monocytes Absolute: 0.4 10*3/uL (ref 0.1–1.0)
Monocytes Relative: 6 %
Neutro Abs: 2.9 10*3/uL (ref 1.7–7.7)
Neutrophils Relative %: 38 %
Platelets: 279 10*3/uL (ref 150–400)
RBC: 4.17 MIL/uL (ref 3.87–5.11)
RDW: 16.9 % — ABNORMAL HIGH (ref 11.5–15.5)
WBC: 7.6 10*3/uL (ref 4.0–10.5)
nRBC: 0 % (ref 0.0–0.2)

## 2022-06-01 NOTE — Progress Notes (Signed)
Chief Complaint:   OBESITY Van is here to discuss her progress with her obesity treatment plan along with follow-up of her obesity related diagnoses. Riverlyn is on the Category 2 Plan and states she is following her eating plan approximately 25% of the time. Nashali states she is doing 0 minutes 0 times per week.  Today's visit was #: 40 Starting weight: 287 lbs Starting date: 03/26/2020 Today's weight: 260 lbs Today's date: 05/13/2022 Total lbs lost to date: 27 Total lbs lost since last in-office visit: 6  Interim History: Kelsey continues to do very well with weight loss.  She is working on increasing protein and vegetables.  She is still eating out more than ideal but she is trying to make better choices.  She is thinking about adding exercise.  Subjective:   1. Polyphagia Kaytlyn is on metformin to help decrease polyphagia.  She is on some psychiatric medications which contributes to weight gain and the metformin may help to mitigate this.  Assessment/Plan:   1. Polyphagia Maximina was encouraged to continue metformin and we will plan on rechecking labs in the next month.  2. BMI 45.0-49.9, adult (HCC)  3. Obesity, Beginning BMI 50.84 Adlynn is currently in the action stage of change. As such, her goal is to continue with weight loss efforts. She has agreed to practicing portion control and making smarter food choices, such as increasing vegetables and decreasing simple carbohydrates.   Exercise goals: Start goal is 50 minutes per week.   Behavioral modification strategies: decreasing simple carbohydrates and no skipping meals.  Leoma has agreed to follow-up with our clinic in 4 weeks. She was informed of the importance of frequent follow-up visits to maximize her success with intensive lifestyle modifications for her multiple health conditions.   Objective:   Blood pressure 124/84, pulse 98, temperature 97.8 F (36.6 C), height 5\' 3"  (1.6 m), weight 260 lb  (117.9 kg), SpO2 97 %. Body mass index is 46.06 kg/m.  Lab Results  Component Value Date   CREATININE 0.93 11/01/2021   BUN 9 11/01/2021   NA 139 11/01/2021   K 4.1 11/01/2021   CL 106 11/01/2021   CO2 22 11/01/2021   Lab Results  Component Value Date   ALT 11 11/01/2021   AST 17 11/01/2021   ALKPHOS 66 11/01/2021   BILITOT 0.4 11/01/2021   Lab Results  Component Value Date   HGBA1C 5.2 11/01/2021   HGBA1C 5.6 06/28/2021   HGBA1C 5.6 06/18/2021   HGBA1C 5.5 12/28/2020   HGBA1C 5.5 12/05/2020   Lab Results  Component Value Date   INSULIN 14.1 06/18/2021   INSULIN 6.0 12/05/2020   INSULIN 10.8 07/18/2020   INSULIN 24.7 03/26/2020   Lab Results  Component Value Date   TSH 2.443 11/01/2021   Lab Results  Component Value Date   CHOL 193 11/01/2021   HDL 73 11/01/2021   LDLCALC 101 (H) 11/01/2021   TRIG 94 11/01/2021   CHOLHDL 2.6 11/01/2021   Lab Results  Component Value Date   VD25OH 52.44 12/28/2020   VD25OH 46.8 12/05/2020   VD25OH 91.02 08/03/2020   Lab Results  Component Value Date   WBC 7.6 05/29/2022   HGB 13.0 05/29/2022   HCT 37.0 05/29/2022   MCV 88.7 05/29/2022   PLT 279 05/29/2022   No results found for: "IRON", "TIBC", "FERRITIN"  Attestation Statements:   Reviewed by clinician on day of visit: allergies, medications, problem list, medical history, surgical history, family history,  social history, and previous encounter notes.  Time spent on visit including pre-visit chart review and post-visit care and charting was 20 minutes.   I, Trixie Dredge, am acting as transcriptionist for Dennard Nip, MD.  I have reviewed the above documentation for accuracy and completeness, and I agree with the above. -  Dennard Nip, MD

## 2022-06-04 DIAGNOSIS — R6889 Other general symptoms and signs: Secondary | ICD-10-CM | POA: Diagnosis not present

## 2022-06-04 DIAGNOSIS — F429 Obsessive-compulsive disorder, unspecified: Secondary | ICD-10-CM | POA: Diagnosis not present

## 2022-06-04 DIAGNOSIS — F319 Bipolar disorder, unspecified: Secondary | ICD-10-CM | POA: Diagnosis not present

## 2022-06-10 ENCOUNTER — Ambulatory Visit (INDEPENDENT_AMBULATORY_CARE_PROVIDER_SITE_OTHER): Payer: Medicare HMO | Admitting: Internal Medicine

## 2022-06-10 ENCOUNTER — Encounter: Payer: Self-pay | Admitting: Internal Medicine

## 2022-06-10 DIAGNOSIS — F258 Other schizoaffective disorders: Secondary | ICD-10-CM

## 2022-06-10 DIAGNOSIS — Z6841 Body Mass Index (BMI) 40.0 and over, adult: Secondary | ICD-10-CM | POA: Diagnosis not present

## 2022-06-10 DIAGNOSIS — E538 Deficiency of other specified B group vitamins: Secondary | ICD-10-CM | POA: Diagnosis not present

## 2022-06-10 DIAGNOSIS — E559 Vitamin D deficiency, unspecified: Secondary | ICD-10-CM | POA: Diagnosis not present

## 2022-06-10 DIAGNOSIS — R7303 Prediabetes: Secondary | ICD-10-CM | POA: Diagnosis not present

## 2022-06-10 DIAGNOSIS — R6889 Other general symptoms and signs: Secondary | ICD-10-CM | POA: Diagnosis not present

## 2022-06-10 NOTE — Assessment & Plan Note (Signed)
Labs are pending in April 2024

## 2022-06-10 NOTE — Assessment & Plan Note (Signed)
On B complex 

## 2022-06-10 NOTE — Assessment & Plan Note (Signed)
Stable Labs are pending in April 2024

## 2022-06-10 NOTE — Progress Notes (Signed)
Subjective:  Patient ID: Brenda Hernandez, female    DOB: January 03, 1976  Age: 47 y.o. MRN: [id number]  CC: Follow-up (4 mnth f/u)   HPI Brenda Hernandez presents for knee OA, obesity, low B12  Labs are pending in April 2024   Outpatient Medications Prior to Visit  Medication Sig Dispense Refill   B Complex-Folic Acid (B COMPLEX PLUS) TABS Take 1 tablet by mouth daily. 100 tablet 5   clomiPRAMINE (ANAFRANIL) 75 MG capsule Take 75 mg by mouth 2 (two) times daily.     cloZAPine (CLOZARIL) 100 MG tablet Take 5 tablets (500 mg total) by mouth daily. 150 tablet 2   divalproex (DEPAKOTE ER) 500 MG 24 hr tablet Take 1 tablet (500 mg total) by mouth daily. (Patient taking differently: Take 2,000 mg by mouth daily.) 30 tablet 5   fluticasone (FLONASE) 50 MCG/ACT nasal spray Place 1 spray into both nostrils daily. 16 g 6   ibuprofen (ADVIL) 400 MG tablet Take 1 tablet (400 mg total) by mouth every 8 (eight) hours as needed for moderate pain. 90 tablet 2   levonorgestrel-ethinyl estradiol (ALESSE) 0.1-20 MG-MCG tablet Sronyx 0.1 mg-20 mcg tablet  TAKE 1 TABLET BY MOUTH EVERY DAY     metFORMIN (GLUCOPHAGE) 1000 MG tablet Take 1,000 mg by mouth 2 (two) times daily with a meal.     Study - CAPTIVA - aspirin 81 mg tablet (PI-Sethi) Chew 1 tablet by mouth daily.     ondansetron (ZOFRAN) 4 MG tablet Take 4 mg by mouth once.     promethazine (PHENERGAN) 6.25 MG/5ML syrup Take 6.25 mg by mouth every 4 (four) hours as needed.     No facility-administered medications prior to visit.    ROS: Review of Systems  Constitutional:  Negative for activity change, appetite change, chills, fatigue and unexpected weight change.  HENT:  Negative for congestion, mouth sores and sinus pressure.   Eyes:  Negative for visual disturbance.  Respiratory:  Negative for cough and chest tightness.   Gastrointestinal:  Negative for abdominal pain and nausea.  Genitourinary:  Negative for difficulty urinating, frequency and vaginal  pain.  Musculoskeletal:  Positive for gait problem. Negative for back pain.  Skin:  Negative for pallor and rash.  Neurological:  Negative for dizziness, tremors, weakness, numbness and headaches.  Psychiatric/Behavioral:  Negative for confusion and sleep disturbance.     Objective:  BP 130/78 (BP Location: Left Arm, Patient Position: Sitting, Cuff Size: Large)   Pulse 95   Temp 98.4 F (36.9 C) (Oral)   Ht 5\' 3"  (1.6 m)   Wt 263 lb (119.3 kg)   LMP 05/13/2022 (Exact Date)   SpO2 98%   BMI 46.59 kg/m   BP Readings from Last 3 Encounters:  06/10/22 130/78  05/13/22 124/84  04/13/22 120/84    Wt Readings from Last 3 Encounters:  06/10/22 263 lb (119.3 kg)  05/13/22 260 lb (117.9 kg)  03/11/22 271 lb (122.9 kg)    Physical Exam Constitutional:      General: She is not in acute distress.    Appearance: She is well-developed. She is obese.  HENT:     Head: Normocephalic.     Right Ear: External ear normal.     Left Ear: External ear normal.     Nose: Nose normal.  Eyes:     General:        Right eye: No discharge.        Left eye: No discharge.  Conjunctiva/sclera: Conjunctivae normal.     Pupils: Pupils are equal, round, and reactive to light.  Neck:     Thyroid: No thyromegaly.     Vascular: No JVD.     Trachea: No tracheal deviation.  Cardiovascular:     Rate and Rhythm: Normal rate and regular rhythm.     Heart sounds: Normal heart sounds.  Pulmonary:     Effort: No respiratory distress.     Breath sounds: No stridor. No wheezing.  Abdominal:     General: Bowel sounds are normal. There is no distension.     Palpations: Abdomen is soft. There is no mass.     Tenderness: There is no abdominal tenderness. There is no guarding or rebound.  Musculoskeletal:        General: No tenderness.     Cervical back: Normal range of motion and neck supple. No rigidity.  Lymphadenopathy:     Cervical: No cervical adenopathy.  Skin:    Findings: No erythema or rash.   Neurological:     Mental Status: She is oriented to person, place, and time.     Cranial Nerves: No cranial nerve deficit.     Motor: No abnormal muscle tone.     Coordination: Coordination normal.     Deep Tendon Reflexes: Reflexes normal.  Psychiatric:        Behavior: Behavior normal.        Thought Content: Thought content normal.        Judgment: Judgment normal.    No cane or a walker  Lab Results  Component Value Date   WBC 7.6 05/29/2022   HGB 13.0 05/29/2022   HCT 37.0 05/29/2022   PLT 279 05/29/2022   GLUCOSE 91 11/01/2021   CHOL 193 11/01/2021   TRIG 94 11/01/2021   HDL 73 11/01/2021   LDLCALC 101 (H) 11/01/2021   ALT 11 11/01/2021   AST 17 11/01/2021   NA 139 11/01/2021   K 4.1 11/01/2021   CL 106 11/01/2021   CREATININE 0.93 11/01/2021   BUN 9 11/01/2021   CO2 22 11/01/2021   TSH 2.443 11/01/2021   HGBA1C 5.2 11/01/2021    No results found.  Assessment & Plan:   Problem List Items Addressed This Visit       Other   Class 3 severe obesity with serious comorbidity and body mass index (BMI) of 50.0 to 59.9 in adult ([place]) - Primary    Max wt was 303 lbs. Pt's goal is 225 lbs      Low serum vitamin B12    On B complex      Pre-diabetes    Check A1c Labs are pending in April 2024      Schizoaffective disorder [facility])    Stable Labs are pending in April 2024      Vitamin D deficiency    Labs are pending in April 2024         No orders of the defined types were placed in this encounter.     Follow-up: Return in about 6 months (around 12/11/2022) for a follow-up visit.  Walker Kehr, MD

## 2022-06-10 NOTE — Assessment & Plan Note (Addendum)
Check A1c Labs are pending in April 2024

## 2022-06-10 NOTE — Assessment & Plan Note (Signed)
Max wt was 303 lbs. Pt's goal is 225 lbs

## 2022-06-11 ENCOUNTER — Ambulatory Visit (INDEPENDENT_AMBULATORY_CARE_PROVIDER_SITE_OTHER): Payer: Medicare HMO | Admitting: Family Medicine

## 2022-06-11 DIAGNOSIS — R6889 Other general symptoms and signs: Secondary | ICD-10-CM | POA: Diagnosis not present

## 2022-06-11 DIAGNOSIS — F319 Bipolar disorder, unspecified: Secondary | ICD-10-CM | POA: Diagnosis not present

## 2022-06-17 ENCOUNTER — Encounter (INDEPENDENT_AMBULATORY_CARE_PROVIDER_SITE_OTHER): Payer: Self-pay | Admitting: Family Medicine

## 2022-06-17 ENCOUNTER — Ambulatory Visit (INDEPENDENT_AMBULATORY_CARE_PROVIDER_SITE_OTHER): Payer: Medicare HMO | Admitting: Family Medicine

## 2022-06-17 VITALS — BP 128/90 | HR 94 | Temp 97.8°F | Ht 63.0 in | Wt 256.0 lb

## 2022-06-17 DIAGNOSIS — Z6841 Body Mass Index (BMI) 40.0 and over, adult: Secondary | ICD-10-CM | POA: Diagnosis not present

## 2022-06-17 DIAGNOSIS — E669 Obesity, unspecified: Secondary | ICD-10-CM

## 2022-06-17 DIAGNOSIS — R6889 Other general symptoms and signs: Secondary | ICD-10-CM | POA: Diagnosis not present

## 2022-06-17 DIAGNOSIS — E538 Deficiency of other specified B group vitamins: Secondary | ICD-10-CM | POA: Diagnosis not present

## 2022-06-17 DIAGNOSIS — E559 Vitamin D deficiency, unspecified: Secondary | ICD-10-CM

## 2022-06-17 DIAGNOSIS — R7303 Prediabetes: Secondary | ICD-10-CM

## 2022-06-18 DIAGNOSIS — R6889 Other general symptoms and signs: Secondary | ICD-10-CM | POA: Diagnosis not present

## 2022-06-18 DIAGNOSIS — F319 Bipolar disorder, unspecified: Secondary | ICD-10-CM | POA: Diagnosis not present

## 2022-06-18 LAB — LIPID PANEL WITH LDL/HDL RATIO
Cholesterol, Total: 168 mg/dL (ref 100–199)
HDL: 54 mg/dL (ref >39)
LDL Chol Calc (NIH): 98 mg/dL (ref 0–99)
LDL/HDL Ratio: 1.8 ratio (ref 0.0–3.2)
Triglycerides: 84 mg/dL (ref 0–149)
VLDL Cholesterol Cal: 16 mg/dL (ref 5–40)

## 2022-06-18 LAB — CMP14+EGFR
ALT: 9 IU/L (ref 0–32)
AST: 16 IU/L (ref 0–40)
Albumin/Globulin Ratio: 1.3 (ref 1.2–2.2)
Albumin: 3.5 g/dL — ABNORMAL LOW (ref 3.9–4.9)
Alkaline Phosphatase: 70 IU/L (ref 44–121)
BUN/Creatinine Ratio: 10 (ref 9–23)
BUN: 9 mg/dL (ref 6–24)
Bilirubin Total: 0.2 mg/dL (ref 0.0–1.2)
CO2: 23 mmol/L (ref 20–29)
Calcium: 8.5 mg/dL — ABNORMAL LOW (ref 8.7–10.2)
Chloride: 102 mmol/L (ref 96–106)
Creatinine, Ser: 0.92 mg/dL (ref 0.57–1.00)
Globulin, Total: 2.6 g/dL (ref 1.5–4.5)
Glucose: 84 mg/dL (ref 70–99)
Potassium: 4.3 mmol/L (ref 3.5–5.2)
Sodium: 139 mmol/L (ref 134–144)
Total Protein: 6.1 g/dL (ref 6.0–8.5)
eGFR: 79 mL/min/{1.73_m2} (ref >59)

## 2022-06-18 LAB — TSH: TSH: 1.33 u[IU]/mL (ref 0.450–4.500)

## 2022-06-18 LAB — HEMOGLOBIN A1C
Est. average glucose Bld gHb Est-mCnc: 114 mg/dL
Hgb A1c MFr Bld: 5.6 % (ref 4.8–5.6)

## 2022-06-18 LAB — VITAMIN B12: Vitamin B-12: 337 pg/mL (ref 232–1245)

## 2022-06-18 LAB — INSULIN, RANDOM: INSULIN: 9.9 u[IU]/mL (ref 2.6–24.9)

## 2022-06-18 LAB — VITAMIN D 25 HYDROXY (VIT D DEFICIENCY, FRACTURES): Vit D, 25-Hydroxy: 31.9 ng/mL (ref 30.0–100.0)

## 2022-06-22 ENCOUNTER — Other Ambulatory Visit: Payer: Medicare HMO

## 2022-06-22 NOTE — Progress Notes (Unsigned)
Chief Complaint:   OBESITY Brenda Hernandez is here to discuss her progress with her obesity treatment plan along with follow-up of her obesity related diagnoses. Brenda Hernandez is on practicing portion control and making smarter food choices, such as increasing vegetables and decreasing simple carbohydrates and states she is following her eating plan approximately 50% of the time. Brenda Hernandez states she is doing 0 minutes 0 times per week.  Today's visit was #: 35 Starting weight: 287 lbs Starting date: 03/26/2020 Today's weight: 256 lbs Today's date: 06/17/2022 Total lbs lost to date: 31 Total lbs lost since last in-office visit: 4  Interim History: Brenda Hernandez continues to do well with her weight loss. She has had some challenges but she has been able to make better food choices overall.   Subjective:   1. Pre-diabetes Brenda Hernandez is on metformin and she is working on decreasing simple carbohydrates. No side effects were noted.   2. Vitamin D deficiency Brenda Hernandez is not on Vitamin D anymore, and she was told to stop by another provider. Her level may be below goal and she is due for labs.   3. B12 deficiency Brenda Hernandez is on B complex vitamins, and she is due for labs. Her last level was within normal limits.   Assessment/Plan:   1. Pre-diabetes We will check labs today. Brenda Hernandez will continue metformin 1,000 mg BID.   - CMP14+EGFR - Lipid Panel With LDL/HDL Ratio - Insulin, random - Hemoglobin A1c - TSH  2. Vitamin D deficiency We will check labs today, and we will follow-up at [name]'s next visit.   - VITAMIN D 25 Hydroxy (Vit-D Deficiency, Fractures)  3. B12 deficiency We will check labs today. Brenda Hernandez will continue her B complex vitamins as is.   - Vitamin B12  4. BMI 45.0-49.9, adult  5. Obesity, Beginning BMI 50.84 Brenda Hernandez is currently in the action stage of change. As such, her goal is to continue with weight loss efforts. She has agreed to keeping a food journal and adhering to  recommended goals of 1400 calories and 80+ grams of protein daily.   Behavioral modification strategies: increasing lean protein intake and emotional eating strategies.  Brenda Hernandez has agreed to follow-up with our clinic in 4 weeks. She was informed of the importance of frequent follow-up visits to maximize her success with intensive lifestyle modifications for her multiple health conditions.   Brenda Hernandez was informed we would discuss her lab results at her next visit unless there is a critical issue that needs to be addressed sooner. Brenda Hernandez agreed to keep her next visit at the agreed upon time to discuss these results.  Objective:   Blood pressure (!) 128/90, pulse 94, temperature 97.8 F (36.6 C), height 5\' 3"  (1.6 m), weight 256 lb (116.1 kg), last menstrual period 05/13/2022, SpO2 97 %. Body mass index is 45.35 kg/m.  Lab Results  Component Value Date   CREATININE 0.92 06/17/2022   BUN 9 06/17/2022   NA 139 06/17/2022   K 4.3 06/17/2022   CL 102 06/17/2022   CO2 23 06/17/2022   Lab Results  Component Value Date   ALT 9 06/17/2022   AST 16 06/17/2022   ALKPHOS 70 06/17/2022   BILITOT 0.2 06/17/2022   Lab Results  Component Value Date   HGBA1C 5.6 06/17/2022   HGBA1C 5.2 11/01/2021   HGBA1C 5.6 06/28/2021   HGBA1C 5.6 06/18/2021   HGBA1C 5.5 12/28/2020   Lab Results  Component Value Date   INSULIN 9.9 06/17/2022   INSULIN  14.1 06/18/2021   INSULIN 6.0 12/05/2020   INSULIN 10.8 07/18/2020   INSULIN 24.7 03/26/2020   Lab Results  Component Value Date   TSH 1.330 06/17/2022   Lab Results  Component Value Date   CHOL 168 06/17/2022   HDL 54 06/17/2022   LDLCALC 98 06/17/2022   TRIG 84 06/17/2022   CHOLHDL 2.6 11/01/2021   Lab Results  Component Value Date   VD25OH 31.9 06/17/2022   VD25OH 52.44 12/28/2020   VD25OH 46.8 12/05/2020   Lab Results  Component Value Date   WBC 7.6 05/29/2022   HGB 13.0 05/29/2022   HCT 37.0 05/29/2022   MCV 88.7 05/29/2022    PLT 279 05/29/2022   No results found for: "IRON", "TIBC", "FERRITIN"  Attestation Statements:   Reviewed by clinician on day of visit: allergies, medications, problem list, medical history, surgical history, family history, social history, and previous encounter notes.   I, Burt Knack, am acting as transcriptionist for Quillian Quince, MD.  I have reviewed the above documentation for accuracy and completeness, and I agree with the above. -  Quillian Quince, MD

## 2022-06-23 ENCOUNTER — Ambulatory Visit
Admission: RE | Admit: 2022-06-23 | Discharge: 2022-06-23 | Disposition: A | Discharge status: Home / Self Care | Payer: Medicare HMO | Source: Ambulatory Visit | Attending: Obstetrics & Gynecology | Admitting: Obstetrics & Gynecology

## 2022-06-23 ENCOUNTER — Other Ambulatory Visit: Payer: Self-pay | Admitting: Obstetrics & Gynecology

## 2022-06-23 DIAGNOSIS — N6452 Nipple discharge: Secondary | ICD-10-CM | POA: Diagnosis not present

## 2022-06-23 DIAGNOSIS — N6310 Unspecified lump in the right breast, unspecified quadrant: Secondary | ICD-10-CM

## 2022-06-23 DIAGNOSIS — R6889 Other general symptoms and signs: Secondary | ICD-10-CM | POA: Diagnosis not present

## 2022-06-25 DIAGNOSIS — R6889 Other general symptoms and signs: Secondary | ICD-10-CM | POA: Diagnosis not present

## 2022-06-25 DIAGNOSIS — F429 Obsessive-compulsive disorder, unspecified: Secondary | ICD-10-CM | POA: Diagnosis not present

## 2022-06-25 DIAGNOSIS — F319 Bipolar disorder, unspecified: Secondary | ICD-10-CM | POA: Diagnosis not present

## 2022-07-02 DIAGNOSIS — R6889 Other general symptoms and signs: Secondary | ICD-10-CM | POA: Diagnosis not present

## 2022-07-02 DIAGNOSIS — F319 Bipolar disorder, unspecified: Secondary | ICD-10-CM | POA: Diagnosis not present

## 2022-07-03 ENCOUNTER — Other Ambulatory Visit (HOSPITAL_COMMUNITY)
Admission: RE | Admit: 2022-07-03 | Discharge: 2022-07-03 | Disposition: A | Discharge status: Home / Self Care | Payer: Medicare HMO | Attending: Psychology | Admitting: Psychology

## 2022-07-03 DIAGNOSIS — F319 Bipolar disorder, unspecified: Secondary | ICD-10-CM | POA: Diagnosis not present

## 2022-07-03 LAB — CBC WITH DIFFERENTIAL/PLATELET
Abs Immature Granulocytes: 0.01 10*3/uL (ref 0.00–0.07)
Basophils Absolute: 0 10*3/uL (ref 0.0–0.1)
Basophils Relative: 0 %
Eosinophils Absolute: 0 10*3/uL (ref 0.0–0.5)
Eosinophils Relative: 0 %
HCT: 37.7 % (ref 36.0–46.0)
Hemoglobin: 13.3 g/dL (ref 12.0–15.0)
Immature Granulocytes: 0 %
Lymphocytes Relative: 51 %
Lymphs Abs: 3.2 10*3/uL (ref 0.7–4.0)
MCH: 31.2 pg (ref 26.0–34.0)
MCHC: 35.3 g/dL (ref 30.0–36.0)
MCV: 88.5 fL (ref 80.0–100.0)
Monocytes Absolute: 0.4 10*3/uL (ref 0.1–1.0)
Monocytes Relative: 6 %
Neutro Abs: 2.7 10*3/uL (ref 1.7–7.7)
Neutrophils Relative %: 43 %
Platelets: 225 10*3/uL (ref 150–400)
RBC: 4.26 MIL/uL (ref 3.87–5.11)
RDW: 16.2 % — ABNORMAL HIGH (ref 11.5–15.5)
WBC: 6.3 10*3/uL (ref 4.0–10.5)
nRBC: 0 % (ref 0.0–0.2)

## 2022-07-15 ENCOUNTER — Ambulatory Visit (INDEPENDENT_AMBULATORY_CARE_PROVIDER_SITE_OTHER): Payer: Medicare HMO | Admitting: Family Medicine

## 2022-07-15 DIAGNOSIS — F429 Obsessive-compulsive disorder, unspecified: Secondary | ICD-10-CM | POA: Diagnosis not present

## 2022-07-15 DIAGNOSIS — F319 Bipolar disorder, unspecified: Secondary | ICD-10-CM | POA: Diagnosis not present

## 2022-07-15 DIAGNOSIS — R6889 Other general symptoms and signs: Secondary | ICD-10-CM | POA: Diagnosis not present

## 2022-07-16 ENCOUNTER — Other Ambulatory Visit: Payer: Medicare HMO

## 2022-07-16 ENCOUNTER — Ambulatory Visit
Admission: RE | Admit: 2022-07-16 | Discharge: 2022-07-16 | Disposition: A | Discharge status: Home / Self Care | Payer: Medicare HMO | Source: Ambulatory Visit | Attending: Obstetrics & Gynecology | Admitting: Obstetrics & Gynecology

## 2022-07-16 DIAGNOSIS — D241 Benign neoplasm of right breast: Secondary | ICD-10-CM | POA: Diagnosis not present

## 2022-07-16 DIAGNOSIS — N6315 Unspecified lump in the right breast, overlapping quadrants: Secondary | ICD-10-CM | POA: Diagnosis not present

## 2022-07-16 DIAGNOSIS — N6452 Nipple discharge: Secondary | ICD-10-CM

## 2022-07-16 DIAGNOSIS — N6310 Unspecified lump in the right breast, unspecified quadrant: Secondary | ICD-10-CM

## 2022-07-16 HISTORY — PX: BREAST BIOPSY: SHX20

## 2022-07-20 ENCOUNTER — Telehealth: Payer: Self-pay | Admitting: *Deleted

## 2022-07-20 NOTE — Telephone Encounter (Signed)
Rec'd forms for " [employer] [place] Application" . Placed in MD purple folder to complete.Marland KitchenRaechel Hernandez

## 2022-07-21 ENCOUNTER — Encounter (INDEPENDENT_AMBULATORY_CARE_PROVIDER_SITE_OTHER): Payer: Self-pay | Admitting: Family Medicine

## 2022-07-21 ENCOUNTER — Ambulatory Visit (INDEPENDENT_AMBULATORY_CARE_PROVIDER_SITE_OTHER): Payer: Medicare HMO | Admitting: Family Medicine

## 2022-07-21 VITALS — BP 116/82 | HR 96 | Temp 97.8°F | Ht 63.0 in | Wt 250.0 lb

## 2022-07-21 DIAGNOSIS — E538 Deficiency of other specified B group vitamins: Secondary | ICD-10-CM

## 2022-07-21 DIAGNOSIS — M25562 Pain in left knee: Secondary | ICD-10-CM | POA: Diagnosis not present

## 2022-07-21 DIAGNOSIS — Z6841 Body Mass Index (BMI) 40.0 and over, adult: Secondary | ICD-10-CM | POA: Diagnosis not present

## 2022-07-21 DIAGNOSIS — R6889 Other general symptoms and signs: Secondary | ICD-10-CM | POA: Diagnosis not present

## 2022-07-21 DIAGNOSIS — G8929 Other chronic pain: Secondary | ICD-10-CM

## 2022-07-21 DIAGNOSIS — R7303 Prediabetes: Secondary | ICD-10-CM

## 2022-07-21 DIAGNOSIS — E669 Obesity, unspecified: Secondary | ICD-10-CM

## 2022-07-21 DIAGNOSIS — E559 Vitamin D deficiency, unspecified: Secondary | ICD-10-CM

## 2022-07-21 DIAGNOSIS — M25561 Pain in right knee: Secondary | ICD-10-CM | POA: Diagnosis not present

## 2022-07-21 MED ORDER — CYANOCOBALAMIN 500 MCG PO TABS
500.0000 ug | ORAL_TABLET | Freq: Every day | ORAL | 0 refills | Status: DC
Start: 2022-07-21 — End: 2022-12-01

## 2022-07-21 MED ORDER — VITAMIN D (ERGOCALCIFEROL) 1.25 MG (50000 UNIT) PO CAPS
50000.0000 [IU] | ORAL_CAPSULE | ORAL | 0 refills | Status: DC
Start: 2022-07-21 — End: 2022-09-23

## 2022-07-21 NOTE — Assessment & Plan Note (Signed)
DJD both knees, has been seen by ortho Goal BMI <40 (225 lb) for TKR Limits walking and ambulates with a cane  Continue active plan for weight reduction Info given on [facility] for water exercise.  We set a goal for 2 days/ wk.

## 2022-07-21 NOTE — Progress Notes (Signed)
Office: [phone]  /  Fax: [phone]  WEIGHT SUMMARY AND BIOMETRICS  Starting Date: 03/26/20  Starting Weight: 287lb   Weight Lost Since Last Visit: 6lb   Vitals Temp: 97.8 F (36.6 C) BP: 116/82 Pulse Rate: 96 SpO2: 99 %   Body Composition  Body Fat %: 55.1 % Fat Mass (lbs): 138 lbs Muscle Mass (lbs): 106.8 lbs Visceral Fat Rating : 17     HPI  Chief Complaint: OBESITY  Brenda Hernandez is here to discuss her progress with her obesity treatment plan. She is on the the Category 2 Plan and states she is following her eating plan approximately 45 % of the time. She states she is exercising 0 minutes 0 times per week.   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 Body mass index is 44.29 kg/m.  General: She is overweight, cooperative, alert, well developed, and in no acute distress. PSYCH: Has normal mood, affect and thought process.   Lungs: Normal breathing effort, no conversational dyspnea.   ASSESSMENT AND PLAN  TREATMENT PLAN FOR OBESITY:  Recommended Dietary Goals  Brenda Hernandez is currently in the action stage of change. As such, her goal is to continue weight management plan. She has agreed to practicing portion control and making smarter food choices, such as increasing vegetables  and decreasing simple carbohydrates.  Behavioral Intervention  We discussed the following Behavioral Modification Strategies today: increasing lean protein intake, decreasing simple carbohydrates , increasing vegetables, increasing lower glycemic fruits, increasing fiber rich foods, avoiding skipping meals, increasing water intake, work on managing stress, creating time for self-care and relaxation measures, avoiding temptations and identifying enticing environmental cues, continue to practice mindfulness when eating, and planning for success.  Additional resources provided today: NA  Recommended Physical Activity Goals  Brenda Hernandez has been advised to work up to 150 minutes of moderate intensity aerobic activity a week and strengthening exercises 2-3 times per week for cardiovascular health, weight loss maintenance and preservation of muscle mass.   She has agreed to Start aerobic activity with a goal of 150 minutes a week at moderate intensity.   Water exercise goal: 2 days/ wk  Pharmacotherapy changes for the treatment of obesity: none  ASSOCIATED CONDITIONS ADDRESSED TODAY  Vitamin D deficiency Assessment & Plan: Last vitamin D Lab Results  Component Value Date   VD25OH 31.9 06/17/2022   Reviewed her labs from last visit.  She has been off of a vitamin D supplement after her level reached 91 in 2022.  Her energy level has been fairly low.  We discussed how vitamin D is important for energy, immune function, bone health and leptin sensitivity.  Begin RX vitamin D 50,000 IU once weekly and recheck level  in 3 mos  Orders: -     Vitamin D (Ergocalciferol); Take 1 capsule (50,000 Units total) by mouth every 7 (seven) days.  Dispense: 12 capsule; Refill: 0  Obesity, Beginning BMI 50.84  BMI 40.0-44.9, adult [facility])  Pre-diabetes Assessment & Plan: Lab Results  Component Value Date   HGBA1C 5.6 06/17/2022   Reviewed labs from last visit.  A1c in normal range on metformin 1000 mg bid.   She is tolerating this well without adverse side effect.  She has room for improvement with a low sugar/ lower starch diet and regular exercise but she is starting to see better improvements over the past month.  Fasting insulin down to 9.9 from 14.1.  Continue to journal food intake.  Add in water exercise 2 days/ wk and continue metformin 1000 mg bid per psych.   Bilateral chronic knee pain Assessment & Plan: DJD both knees, has been seen by ortho Goal BMI <40 (225 lb) for TKR Limits walking and ambulates with a cane  Continue active plan for weight reduction Info given on [facility] for water exercise.  We set a goal for 2 days/ wk.   Low serum vitamin B12 Assessment & Plan: Reviewed lab from last visit.   B12 still in the low normal range at 337.  She has been taking a B complex vitamin daily Denies paresthesias but has fatigue  D/c B complex vitamin Begin RX Vitamin B12 500 mcg daily Recheck level in 3 mos  Orders: -     Cyanocobalamin; Take 1 tablet (500 mcg total) by mouth daily.  Dispense: 90 tablet; Refill: 0      She was informed of the importance of frequent follow up visits to maximize her success with intensive lifestyle modifications for her multiple health conditions.   ATTESTASTION STATEMENTS:  Reviewed by clinician on day of visit: allergies, medications, problem list, medical history, surgical history, family history, social history, and previous encounter notes pertinent to obesity diagnosis.   I have personally spent 30 minutes total time today in preparation, patient care, nutritional counseling and documentation for this visit, including the following: review of clinical lab tests; review of medical tests/procedures/services.      Glennis Brink, DO DABFM, DABOM [facility] [address] [place], [place] [address] [phone]

## 2022-07-21 NOTE — Assessment & Plan Note (Addendum)
Lab Results  Component Value Date   HGBA1C 5.6 06/17/2022   Reviewed labs from last visit.  A1c in normal range on metformin 1000 mg bid.  She is tolerating this well without adverse side effect.  She has room for improvement with a low sugar/ lower starch diet and regular exercise but she is starting to see better improvements over the past month.  Fasting insulin down to 9.9 from 14.1.  Continue to journal food intake.  Add in water exercise 2 days/ wk and continue metformin 1000 mg bid per psych.

## 2022-07-21 NOTE — Assessment & Plan Note (Signed)
Reviewed lab from last visit.   B12 still in the low normal range at 337.  She has been taking a B complex vitamin daily Denies paresthesias but has fatigue  D/c B complex vitamin Begin RX Vitamin B12 500 mcg daily Recheck level in 3 mos

## 2022-07-21 NOTE — Telephone Encounter (Signed)
Done. Thanks.

## 2022-07-21 NOTE — Assessment & Plan Note (Signed)
Last vitamin D Lab Results  Component Value Date   VD25OH 31.9 06/17/2022   Reviewed her labs from last visit.  She has been off of a vitamin D supplement after her level reached 91 in 2022.  Her energy level has been fairly low.  We discussed how vitamin D is important for energy, immune function, bone health and leptin sensitivity.  Begin RX vitamin D 50,000 IU once weekly and recheck level in 3 mos

## 2022-07-21 NOTE — Telephone Encounter (Signed)
Called pt no answer LMOM [employer] [place] forms will be mail to her address.Marland KitchenRaechel Hernandez

## 2022-07-22 DIAGNOSIS — F429 Obsessive-compulsive disorder, unspecified: Secondary | ICD-10-CM | POA: Diagnosis not present

## 2022-07-22 DIAGNOSIS — F319 Bipolar disorder, unspecified: Secondary | ICD-10-CM | POA: Diagnosis not present

## 2022-07-22 DIAGNOSIS — R6889 Other general symptoms and signs: Secondary | ICD-10-CM | POA: Diagnosis not present

## 2022-07-24 ENCOUNTER — Telehealth: Payer: Self-pay | Admitting: Internal Medicine

## 2022-07-24 NOTE — Telephone Encounter (Signed)
Pt called checking on the status for her forms she drop them off last week. Please call pt when they are completed.

## 2022-07-27 ENCOUNTER — Telehealth (INDEPENDENT_AMBULATORY_CARE_PROVIDER_SITE_OTHER): Payer: Self-pay | Admitting: Family Medicine

## 2022-07-27 NOTE — Telephone Encounter (Signed)
Called pt inform forms was mailed to her address. She states she received them on Sat.Marland KitchenRaechel Hernandez

## 2022-07-27 NOTE — Telephone Encounter (Signed)
Patient wants to know if Dr. Cathey Endow wants her to continue taking the b12 with the complex vitamins or stop taking the b12 all together? Patient would like if Dr. Cathey Endow calls her back. Brenda Hernandez

## 2022-07-28 DIAGNOSIS — F429 Obsessive-compulsive disorder, unspecified: Secondary | ICD-10-CM | POA: Diagnosis not present

## 2022-07-28 DIAGNOSIS — F319 Bipolar disorder, unspecified: Secondary | ICD-10-CM | POA: Diagnosis not present

## 2022-07-28 DIAGNOSIS — R6889 Other general symptoms and signs: Secondary | ICD-10-CM | POA: Diagnosis not present

## 2022-07-28 NOTE — Telephone Encounter (Signed)
See my chart message

## 2022-07-30 DIAGNOSIS — F429 Obsessive-compulsive disorder, unspecified: Secondary | ICD-10-CM | POA: Diagnosis not present

## 2022-07-30 DIAGNOSIS — F319 Bipolar disorder, unspecified: Secondary | ICD-10-CM | POA: Diagnosis not present

## 2022-07-31 ENCOUNTER — Other Ambulatory Visit (HOSPITAL_COMMUNITY)
Admission: AD | Admit: 2022-07-31 | Discharge: 2022-07-31 | Disposition: A | Discharge status: Home / Self Care | Payer: Medicare HMO | Attending: Psychology | Admitting: Psychology

## 2022-07-31 DIAGNOSIS — Z79899 Other long term (current) drug therapy: Secondary | ICD-10-CM | POA: Insufficient documentation

## 2022-07-31 LAB — CBC WITH DIFFERENTIAL/PLATELET
Abs Immature Granulocytes: 0.02 10*3/uL (ref 0.00–0.07)
Basophils Absolute: 0 10*3/uL (ref 0.0–0.1)
Basophils Relative: 0 %
Eosinophils Absolute: 0 10*3/uL (ref 0.0–0.5)
Eosinophils Relative: 0 %
HCT: 35.9 % — ABNORMAL LOW (ref 36.0–46.0)
Hemoglobin: 12.3 g/dL (ref 12.0–15.0)
Immature Granulocytes: 0 %
Lymphocytes Relative: 54 %
Lymphs Abs: 4.5 10*3/uL — ABNORMAL HIGH (ref 0.7–4.0)
MCH: 30.9 pg (ref 26.0–34.0)
MCHC: 34.3 g/dL (ref 30.0–36.0)
MCV: 90.2 fL (ref 80.0–100.0)
Monocytes Absolute: 0.5 10*3/uL (ref 0.1–1.0)
Monocytes Relative: 6 %
Neutro Abs: 3.2 10*3/uL (ref 1.7–7.7)
Neutrophils Relative %: 40 %
Platelets: 262 10*3/uL (ref 150–400)
RBC: 3.98 MIL/uL (ref 3.87–5.11)
RDW: 16 % — ABNORMAL HIGH (ref 11.5–15.5)
WBC: 8.2 10*3/uL (ref 4.0–10.5)
nRBC: 0 % (ref 0.0–0.2)

## 2022-08-06 DIAGNOSIS — R6889 Other general symptoms and signs: Secondary | ICD-10-CM | POA: Diagnosis not present

## 2022-08-06 DIAGNOSIS — F319 Bipolar disorder, unspecified: Secondary | ICD-10-CM | POA: Diagnosis not present

## 2022-08-06 DIAGNOSIS — F429 Obsessive-compulsive disorder, unspecified: Secondary | ICD-10-CM | POA: Diagnosis not present

## 2022-08-13 ENCOUNTER — Ambulatory Visit: Payer: Self-pay | Admitting: General Surgery

## 2022-08-13 DIAGNOSIS — D241 Benign neoplasm of right breast: Secondary | ICD-10-CM | POA: Diagnosis not present

## 2022-08-19 ENCOUNTER — Other Ambulatory Visit: Payer: Self-pay | Admitting: General Surgery

## 2022-08-19 DIAGNOSIS — D241 Benign neoplasm of right breast: Secondary | ICD-10-CM

## 2022-08-27 DIAGNOSIS — F319 Bipolar disorder, unspecified: Secondary | ICD-10-CM | POA: Diagnosis not present

## 2022-08-28 ENCOUNTER — Other Ambulatory Visit (HOSPITAL_COMMUNITY)
Admission: RE | Admit: 2022-08-28 | Discharge: 2022-08-28 | Disposition: A | Discharge status: Home / Self Care | Payer: Medicare HMO | Attending: Psychology | Admitting: Psychology

## 2022-08-28 DIAGNOSIS — Z79899 Other long term (current) drug therapy: Secondary | ICD-10-CM | POA: Diagnosis not present

## 2022-08-28 LAB — CBC WITH DIFFERENTIAL/PLATELET
Abs Immature Granulocytes: 0.01 10*3/uL (ref 0.00–0.07)
Basophils Absolute: 0 10*3/uL (ref 0.0–0.1)
Basophils Relative: 0 %
Eosinophils Absolute: 0 10*3/uL (ref 0.0–0.5)
Eosinophils Relative: 0 %
HCT: 37 % (ref 36.0–46.0)
Hemoglobin: 12.2 g/dL (ref 12.0–15.0)
Immature Granulocytes: 0 %
Lymphocytes Relative: 51 %
Lymphs Abs: 3.3 10*3/uL (ref 0.7–4.0)
MCH: 29.9 pg (ref 26.0–34.0)
MCHC: 33 g/dL (ref 30.0–36.0)
MCV: 90.7 fL (ref 80.0–100.0)
Monocytes Absolute: 0.3 10*3/uL (ref 0.1–1.0)
Monocytes Relative: 5 %
Neutro Abs: 2.8 10*3/uL (ref 1.7–7.7)
Neutrophils Relative %: 44 %
Platelets: 286 10*3/uL (ref 150–400)
RBC: 4.08 MIL/uL (ref 3.87–5.11)
RDW: 15.1 % (ref 11.5–15.5)
WBC: 6.5 10*3/uL (ref 4.0–10.5)
nRBC: 0 % (ref 0.0–0.2)

## 2022-09-01 ENCOUNTER — Ambulatory Visit (INDEPENDENT_AMBULATORY_CARE_PROVIDER_SITE_OTHER): Payer: Medicare HMO | Admitting: Physician Assistant

## 2022-09-01 ENCOUNTER — Encounter (INDEPENDENT_AMBULATORY_CARE_PROVIDER_SITE_OTHER): Payer: Self-pay | Admitting: Physician Assistant

## 2022-09-01 VITALS — BP 104/71 | HR 82 | Temp 97.8°F | Ht 63.0 in | Wt 257.0 lb

## 2022-09-01 DIAGNOSIS — E559 Vitamin D deficiency, unspecified: Secondary | ICD-10-CM

## 2022-09-01 DIAGNOSIS — R7303 Prediabetes: Secondary | ICD-10-CM

## 2022-09-01 DIAGNOSIS — E538 Deficiency of other specified B group vitamins: Secondary | ICD-10-CM

## 2022-09-01 DIAGNOSIS — Z6841 Body Mass Index (BMI) 40.0 and over, adult: Secondary | ICD-10-CM | POA: Diagnosis not present

## 2022-09-01 NOTE — Progress Notes (Signed)
.smr  Office: [phone]  /  Fax: [phone]  WEIGHT SUMMARY AND BIOMETRICS  Vitals Temp: 97.8 F (36.6 C) BP: 104/71 Pulse Rate: 82 SpO2: 98 %   Anthropometric Measurements Height: 5\' 3"  (1.6 m) Weight: 257 lb (116.6 kg) BMI (Calculated): 45.54 Weight at Last Visit: 250 lb Weight Lost Since Last Visit: 0 lb Weight Gained Since Last Visit: 7 lb Starting Weight: 287 lb   Body Composition  Body Fat %: 53.2 % Fat Mass (lbs): 136.8 lbs Muscle Mass (lbs): 114.4 lbs Visceral Fat Rating : 17   Other Clinical Data Fasting: No Labs: No Today's Visit #: 8 Starting Date: 03/26/20     HPI  Chief Complaint: OBESITY  Brenda Hernandez is here to discuss her progress with her obesity treatment plan. She is on the the Category 2 Plan and states she is following her eating plan approximately 25 % of the time. She states she is exercising walking some minutes several times per week.   Interval History:  Since last office visit she up 7 lbs Bio impedence: Up 7.6 lbs muscle mass Down 1.2 lbs adipose mass  Hunger/appetite-Struggles to meet calorie and protein goals.,  Cravings- for ramen noodles has improved with working with her therapist. She is eating some Uncrustables when she gets hungry late at night and we discussed trying to make sure she is getting adequate protein and calories during the day.  Stress- reports manageable. Sees her therapist regularly. 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  Exercise-Walking more lately- Reports not using a cane or walker currently .  She is working to loose weight to have left knee replacement surgery in the future, but is hopeful she might be able to avoid surgery if she looses enough weight. Hydration-Working on increasing water intake.    Pharmacotherapy: metformin for prediabetes- Prescribed by Psychiatry. No GI side effects.   TREATMENT PLAN FOR OBESITY:  Recommended  Dietary Goals  Brenda Hernandez is currently in the action stage of change. As such, her goal is to continue weight management plan. She has agreed to the Category 2 Plan.  Behavioral Intervention  We discussed the following Behavioral Modification Strategies today: increasing lean protein intake, decreasing simple carbohydrates , increasing vegetables, increasing lower glycemic fruits, avoiding skipping meals, increasing water intake, reading food labels , keeping healthy foods at home, decreasing eating out or consumption of processed foods, and making healthy choices when eating convenient foods, continue to practice mindfulness when eating, and planning for success.  Additional resources provided today: NA  Recommended Physical Activity Goals  Brenda Hernandez has been advised to work up to 150 minutes of moderate intensity aerobic activity a week and strengthening exercises 2-3 times per week for cardiovascular health, weight loss maintenance and preservation of muscle mass.   She has agreed to Continue current level of physical activity  and Think about ways to increase daily physical activity and overcoming barriers to exercise   Pharmacotherapy We discussed various medication options to help Brenda Hernandez with her weight loss efforts and we both agreed to continue metformin for prediabetes.    Return in about 4 weeks (around 09/29/2022).Marland Kitchen She was informed of the importance of frequent follow up visits to maximize her success with intensive lifestyle modifications for her multiple health conditions.  PHYSICAL EXAM:  Blood pressure 104/71, pulse 82, temperature 97.8 F (36.6 C), height 5\' 3"  (1.6 m), weight 257 lb (116.6 kg), SpO2 98 %. Body mass index is 45.53 kg/m.  General: She is overweight, cooperative, alert, well developed, and in no acute distress. PSYCH: Has normal mood, affect and thought process.   Cardiovascular: HR 80's regular, BP 104/71 Lungs: Normal breathing effort, no conversational  dyspnea. Neuro: no focal deficits  DIAGNOSTIC DATA REVIEWED:  BMET    Component Value Date/Time   NA 139 06/17/2022 1436   K 4.3 06/17/2022 1436   CL 102 06/17/2022 1436   CO2 23 06/17/2022 1436   GLUCOSE 84 06/17/2022 1436   GLUCOSE 91 11/01/2021 1120   BUN 9 06/17/2022 1436   CREATININE 0.92 06/17/2022 1436   CALCIUM 8.5 (L) 06/17/2022 1436   GFRNONAA >60 11/01/2021 1120   GFRAA 98 03/26/2020 1012   Lab Results  Component Value Date   HGBA1C 5.6 06/17/2022   HGBA1C 5.7 (H) 09/01/2014   Lab Results  Component Value Date   INSULIN 9.9 06/17/2022   INSULIN 24.7 03/26/2020   Lab Results  Component Value Date   TSH 1.330 06/17/2022   CBC    Component Value Date/Time   WBC 6.5 08/28/2022 1550   RBC 4.08 08/28/2022 1550   HGB 12.2 08/28/2022 1550   HGB 12.6 03/26/2020 1012   HCT 37.0 08/28/2022 1550   HCT 38.2 03/26/2020 1012   PLT 286 08/28/2022 1550   PLT 298 03/26/2020 1012   MCV 90.7 08/28/2022 1550   MCV 88 03/26/2020 1012   MCH 29.9 08/28/2022 1550   MCHC 33.0 08/28/2022 1550   RDW 15.1 08/28/2022 1550   RDW 15.8 (H) 03/26/2020 1012   Iron Studies No results found for: "IRON", "TIBC", "FERRITIN", "IRONPCTSAT" Lipid Panel     Component Value Date/Time   CHOL 168 06/17/2022 1436   TRIG 84 06/17/2022 1436   HDL 54 06/17/2022 1436   CHOLHDL 2.6 11/01/2021 1120   VLDL 19 11/01/2021 1120   LDLCALC 98 06/17/2022 1436   Hepatic Function Panel     Component Value Date/Time   PROT 6.1 06/17/2022 1436   ALBUMIN 3.5 (L) 06/17/2022 1436   AST 16 06/17/2022 1436   ALT 9 06/17/2022 1436   ALKPHOS 70 06/17/2022 1436   BILITOT 0.2 06/17/2022 1436      Component Value Date/Time   TSH 1.330 06/17/2022 1436   Nutritional Lab Results  Component Value Date   VD25OH 31.9 06/17/2022   VD25OH 52.44 12/28/2020   VD25OH 46.8 12/05/2020    ASSOCIATED CONDITIONS ADDRESSED TODAY  ASSESSMENT AND PLAN  Problem List Items Addressed This Visit      Pre-diabetes - Primary   Vitamin D deficiency   Low serum vitamin B12   Morbid obesity (HCC)   BMI 45.0-49.9, adult (HCC)   Prediabetes Last A1c was 5.6/ Insulin 9.9- improved, but not at goal.   Medication(s):  metformin 1000 mg twice daily. No GI or other side effects with metformin.  Polyphagia:No She is working on nutrition plan to decrease simple carbohydrates, increase lean proteins and exercise to promote weight loss, improve glycemic control and prevent progression to Type 2 diabetes.   Lab Results  Component Value Date   HGBA1C 5.6 06/17/2022   HGBA1C 5.2 11/01/2021   HGBA1C 5.6 06/28/2021   HGBA1C 5.6 06/18/2021   HGBA1C 5.5 12/28/2020   Lab Results  Component Value Date   INSULIN 9.9 06/17/2022   INSULIN 14.1 06/18/2021   INSULIN 6.0 12/05/2020   INSULIN 10.8 07/18/2020   INSULIN 24.7 03/26/2020    Plan: Continue  metformin 1000 mg twice daily - Prescribed by Psychiatry.  Continue working on nutrition plan to decrease simple carbohydrates, increase lean proteins and exercise to promote weight loss, improve glycemic control and prevent progression to Type 2 diabetes.    Vitamin D Deficiency Vitamin D is not at goal of 50.  Most recent vitamin D level was 31.9. She is on  prescription ergocalciferol 50,000 IU weekly. Lab Results  Component Value Date   VD25OH 31.9 06/17/2022   VD25OH 52.44 12/28/2020   VD25OH 46.8 12/05/2020    Plan: Continue  prescription ergocalciferol 50,000 IU weekly Low vitamin D levels can be associated with adiposity and may result in leptin resistance and weight gain. Also associated with fatigue. Currently on vitamin D supplementation without any adverse effects.    Low serum B 12 level:  Patient advised to take B 12 supplement 500 mcg daily. Last level low normal 337.  Endorses fatigue.  Plan: Advised to continue B 12  500 mcg daily. Plan to recheck labs in 2-3 months.     ATTESTASTION STATEMENTS:  Reviewed by clinician on  day of visit: allergies, medications, problem list, medical history, surgical history, family history, social history, and previous encounter notes.   I have personally spent 42 minutes total time today in preparation, patient care, nutritional counseling and documentation for this visit, including the following: review of clinical lab tests; review of medical tests/procedures/services.      Craig Wisnewski, PA-C

## 2022-09-10 DIAGNOSIS — F319 Bipolar disorder, unspecified: Secondary | ICD-10-CM | POA: Diagnosis not present

## 2022-09-10 DIAGNOSIS — F429 Obsessive-compulsive disorder, unspecified: Secondary | ICD-10-CM | POA: Diagnosis not present

## 2022-09-12 ENCOUNTER — Other Ambulatory Visit (INDEPENDENT_AMBULATORY_CARE_PROVIDER_SITE_OTHER): Payer: Self-pay | Admitting: Family Medicine

## 2022-09-12 DIAGNOSIS — E559 Vitamin D deficiency, unspecified: Secondary | ICD-10-CM

## 2022-09-14 ENCOUNTER — Telehealth (INDEPENDENT_AMBULATORY_CARE_PROVIDER_SITE_OTHER): Payer: Self-pay | Admitting: Physician Assistant

## 2022-09-14 NOTE — Telephone Encounter (Signed)
Patient stated she has two questions:   1) how many b12 tablets do she take a day? 2) Can you send vitamin D 50,000 unit once weekly to pharmacy?

## 2022-09-24 DIAGNOSIS — F319 Bipolar disorder, unspecified: Secondary | ICD-10-CM | POA: Diagnosis not present

## 2022-09-28 ENCOUNTER — Other Ambulatory Visit: Payer: Self-pay

## 2022-09-28 ENCOUNTER — Encounter (HOSPITAL_BASED_OUTPATIENT_CLINIC_OR_DEPARTMENT_OTHER): Payer: Self-pay | Admitting: General Surgery

## 2022-09-29 ENCOUNTER — Encounter (INDEPENDENT_AMBULATORY_CARE_PROVIDER_SITE_OTHER): Payer: Self-pay | Admitting: Physician Assistant

## 2022-09-29 ENCOUNTER — Ambulatory Visit (INDEPENDENT_AMBULATORY_CARE_PROVIDER_SITE_OTHER): Payer: Medicare HMO | Admitting: Physician Assistant

## 2022-09-29 VITALS — BP 133/95 | HR 87 | Temp 98.1°F | Ht 63.0 in | Wt 260.0 lb

## 2022-09-29 DIAGNOSIS — E559 Vitamin D deficiency, unspecified: Secondary | ICD-10-CM | POA: Diagnosis not present

## 2022-09-29 DIAGNOSIS — Z6841 Body Mass Index (BMI) 40.0 and over, adult: Secondary | ICD-10-CM | POA: Diagnosis not present

## 2022-09-29 DIAGNOSIS — R7303 Prediabetes: Secondary | ICD-10-CM

## 2022-09-29 DIAGNOSIS — E538 Deficiency of other specified B group vitamins: Secondary | ICD-10-CM

## 2022-09-29 MED ORDER — VITAMIN D (ERGOCALCIFEROL) 1.25 MG (50000 UNIT) PO CAPS
50000.0000 [IU] | ORAL_CAPSULE | ORAL | 0 refills | Status: DC
Start: 2022-09-29 — End: 2022-12-01

## 2022-09-29 NOTE — Progress Notes (Signed)
.smr  Office: [phone]  /  Fax: [phone]  WEIGHT SUMMARY AND BIOMETRICS  Vitals Temp: 98.1 F (36.7 C) BP: (!) 133/95 Pulse Rate: 87 SpO2: 98 %   Anthropometric Measurements Height: 5\' 3"  (1.6 m) Weight: 260 lb (117.9 kg) BMI (Calculated): 46.07 Weight at Last Visit: 257lb Weight Lost Since Last Visit: 0lb Weight Gained Since Last Visit: 3lb Starting Weight: 287lb Total Weight Loss (lbs): 27 lb (12.2 kg)   Body Composition  Body Fat %: 56.4 % Fat Mass (lbs): 147 lbs Muscle Mass (lbs): 107.8 lbs Visceral Fat Rating : 18   Other Clinical Data Fasting: No Labs: no Today's Visit #: 39 Starting Date: 03/26/20     HPI  Chief Complaint: OBESITY  Arlenne is here to discuss her progress with her obesity treatment plan. She is on the the Category 2 Plan and states she is following her eating plan approximately 25-30 % of the time. She states she is exercising walking around the house 30 minutes 5-7 times per week.   Interval History:  Since last office visit she up 3 lbs.  Down 27 lbs overall/ TBW loss 9.41% since 03/26/20  Hunger/appetite-Struggles with skipping meals/not meeting protein goals. We discussed the importance of regular meals and adequate protein intake for weight loss and overall health.  Cravings- Doing better not fixating on certain foods like ramen noodles. But does tend to get into patterns of eating higher calorie foods like glazed walnuts or honey roasted pecans at times. Discussed better higher protein/lower sugar options Stress- manageable. Sees therapist regularly.  Sleep- Feels mostly restorative Exercise-Trying to walk some around the house. 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   4) Switch to fat free or skim milk products from whole milk products.   Pharmacotherapy: metformin 1000 mg twice daily. Prescribed by psychiatry. No GI or other side effects with metformin.   TREATMENT PLAN FOR OBESITY:  Recommended Dietary Goals  Saida is currently in the action stage of change. As such, her goal is to continue weight management plan. She has agreed to the Category 2 Plan.  Behavioral Intervention  We discussed the following Behavioral Modification Strategies today: increasing lean protein intake, decreasing simple carbohydrates , increasing vegetables, increasing lower glycemic fruits, avoiding skipping meals, increasing water intake, keeping healthy foods at home, identifying sources and decreasing liquid calories, continue to practice mindfulness when eating, and planning for success.  Additional resources provided today: NA  Recommended Physical Activity Goals  Shakemia has been advised to work up to 150 minutes of moderate intensity aerobic activity a week and strengthening exercises 2-3 times per week for cardiovascular health, weight loss maintenance and preservation of muscle mass.   She has agreed to  try chair yoga exercises 2-3 times weekly for 15-25 minutes   Pharmacotherapy We discussed various medication options to help Anesha with her weight loss efforts and we both agreed to continue metformin 1000 mg twice daily for prediabetes.    Return in about 4 weeks (around 10/27/2022).Marland Kitchen She was informed of the importance of frequent follow up visits to maximize her success with  intensive lifestyle modifications for her multiple health conditions.  PHYSICAL EXAM:  Blood pressure (!) 133/95, pulse 87, temperature 98.1 F (36.7 C), height 5\' 3"  (1.6 m), weight 260 lb (117.9 kg), SpO2 98%. Body mass index is 46.06 kg/m.  General: She is overweight,  cooperative, alert, well developed, and in no acute distress. PSYCH: Has normal mood, affect and thought process.   Cardiovascular: HR 80's regular, BP 133/95- up today Lungs: Normal breathing effort, no conversational dyspnea.  DIAGNOSTIC DATA REVIEWED:  BMET    Component Value Date/Time   NA 139 06/17/2022 1436   K 4.3 06/17/2022 1436   CL 102 06/17/2022 1436   CO2 23 06/17/2022 1436   GLUCOSE 84 06/17/2022 1436   GLUCOSE 91 11/01/2021 1120   BUN 9 06/17/2022 1436   CREATININE 0.92 06/17/2022 1436   CALCIUM 8.5 (L) 06/17/2022 1436   GFRNONAA >60 11/01/2021 1120   GFRAA 98 03/26/2020 1012   Lab Results  Component Value Date   HGBA1C 5.6 06/17/2022   HGBA1C 5.7 (H) 09/01/2014   Lab Results  Component Value Date   INSULIN 9.9 06/17/2022   INSULIN 24.7 03/26/2020   Lab Results  Component Value Date   TSH 1.330 06/17/2022   CBC    Component Value Date/Time   WBC 6.5 08/28/2022 1550   RBC 4.08 08/28/2022 1550   HGB 12.2 08/28/2022 1550   HGB 12.6 03/26/2020 1012   HCT 37.0 08/28/2022 1550   HCT 38.2 03/26/2020 1012   PLT 286 08/28/2022 1550   PLT 298 03/26/2020 1012   MCV 90.7 08/28/2022 1550   MCV 88 03/26/2020 1012   MCH 29.9 08/28/2022 1550   MCHC 33.0 08/28/2022 1550   RDW 15.1 08/28/2022 1550   RDW 15.8 (H) 03/26/2020 1012   Iron Studies No results found for: "IRON", "TIBC", "FERRITIN", "IRONPCTSAT" Lipid Panel     Component Value Date/Time   CHOL 168 06/17/2022 1436   TRIG 84 06/17/2022 1436   HDL 54 06/17/2022 1436   CHOLHDL 2.6 11/01/2021 1120   VLDL 19 11/01/2021 1120   LDLCALC 98 06/17/2022 1436   Hepatic Function Panel     Component Value Date/Time   PROT 6.1 06/17/2022 1436   ALBUMIN 3.5 (L) 06/17/2022 1436   AST 16 06/17/2022 1436   ALT 9 06/17/2022 1436   ALKPHOS 70 06/17/2022 1436   BILITOT 0.2 06/17/2022 1436      Component Value Date/Time   TSH 1.330 06/17/2022 1436   Nutritional Lab Results  Component Value Date   VD25OH  31.9 06/17/2022   VD25OH 52.44 12/28/2020   VD25OH 46.8 12/05/2020    ASSOCIATED CONDITIONS ADDRESSED TODAY  ASSESSMENT AND PLAN  Problem List Items Addressed This Visit     Pre-diabetes - Primary   Vitamin D deficiency   Relevant Medications   Vitamin D, Ergocalciferol, (DRISDOL) 1.25 MG (50000 UNIT) CAPS capsule   Morbid obesity (HCC)   BMI 45.0-49.9, adult (HCC)   Prediabetes Last A1c was 5.6- at goal. Insulin 9.9 - improved, but not at goal  Medication(s):  metformin 1000 mg twice daily. No GI or other side effects with metformin.  Polyphagia:No Lab Results  Component Value Date   HGBA1C 5.6 06/17/2022   HGBA1C 5.2 11/01/2021   HGBA1C 5.6 06/28/2021   HGBA1C 5.6 06/18/2021   HGBA1C 5.5 12/28/2020   Lab Results  Component Value Date   INSULIN 9.9 06/17/2022   INSULIN 14.1 06/18/2021   INSULIN 6.0 12/05/2020   INSULIN 10.8 07/18/2020  INSULIN 24.7 03/26/2020    Plan: Continue  metformin 1000 mg twice daily.   Prescribed by psychiatry  Continue working on nutrition plan to decrease simple carbohydrates, increase lean proteins and exercise to promote weight loss, improve glycemic control and prevent progression to Type 2 diabetes.    Vitamin D Deficiency Vitamin D is not at goal of 50.  Most recent vitamin D level was 31.9. She is on  prescription ergocalciferol 50,000 IU weekly. Lab Results  Component Value Date   VD25OH 31.9 06/17/2022   VD25OH 52.44 12/28/2020   VD25OH 46.8 12/05/2020    Plan: Continue and refill  prescription ergocalciferol 50,000 IU weekly Low vitamin D levels can be associated with adiposity and may result in leptin resistance and weight gain. Also associated with fatigue. Currently on vitamin D supplementation without any adverse effects.  Recheck vitamin D level 3-4 times yearly to optimize supplemenation.     ATTESTASTION STATEMENTS:  Reviewed by clinician on day of visit: allergies, medications, problem list, medical history,  surgical history, family history, social history, and previous encounter notes.   I have personally spent 44 minutes total time today in preparation, patient care, nutritional counseling and documentation for this visit, including the following: review of clinical lab tests; review of medical tests/procedures/services.      Tinzley Dalia, PA-C

## 2022-10-02 ENCOUNTER — Other Ambulatory Visit (HOSPITAL_COMMUNITY)
Admission: AD | Admit: 2022-10-02 | Discharge: 2022-10-02 | Disposition: A | Discharge status: Home / Self Care | Payer: Medicare HMO | Source: Home / Self Care | Attending: Psychology | Admitting: Psychology

## 2022-10-02 DIAGNOSIS — Z79899 Other long term (current) drug therapy: Secondary | ICD-10-CM | POA: Insufficient documentation

## 2022-10-02 LAB — CBC WITH DIFFERENTIAL/PLATELET
Abs Immature Granulocytes: 0.02 10*3/uL (ref 0.00–0.07)
Basophils Absolute: 0 10*3/uL (ref 0.0–0.1)
Basophils Relative: 0 %
Eosinophils Absolute: 0 10*3/uL (ref 0.0–0.5)
Eosinophils Relative: 0 %
HCT: 36.8 % (ref 36.0–46.0)
Hemoglobin: 12.3 g/dL (ref 12.0–15.0)
Immature Granulocytes: 0 %
Lymphocytes Relative: 52 %
Lymphs Abs: 4.5 10*3/uL — ABNORMAL HIGH (ref 0.7–4.0)
MCH: 30.3 pg (ref 26.0–34.0)
MCHC: 33.4 g/dL (ref 30.0–36.0)
MCV: 90.6 fL (ref 80.0–100.0)
Monocytes Absolute: 0.6 10*3/uL (ref 0.1–1.0)
Monocytes Relative: 7 %
Neutro Abs: 3.6 10*3/uL (ref 1.7–7.7)
Neutrophils Relative %: 41 %
Platelets: 250 10*3/uL (ref 150–400)
RBC: 4.06 MIL/uL (ref 3.87–5.11)
RDW: 15.4 % (ref 11.5–15.5)
WBC: 8.8 10*3/uL (ref 4.0–10.5)
nRBC: 0 % (ref 0.0–0.2)

## 2022-10-07 ENCOUNTER — Encounter (HOSPITAL_BASED_OUTPATIENT_CLINIC_OR_DEPARTMENT_OTHER)
Admission: RE | Admit: 2022-10-07 | Discharge: 2022-10-07 | Disposition: A | Discharge status: Home / Self Care | Payer: Medicare HMO | Source: Ambulatory Visit | Attending: General Surgery | Admitting: General Surgery

## 2022-10-07 DIAGNOSIS — F319 Bipolar disorder, unspecified: Secondary | ICD-10-CM | POA: Diagnosis not present

## 2022-10-07 DIAGNOSIS — D241 Benign neoplasm of right breast: Secondary | ICD-10-CM | POA: Diagnosis not present

## 2022-10-07 DIAGNOSIS — Z6841 Body Mass Index (BMI) 40.0 and over, adult: Secondary | ICD-10-CM | POA: Diagnosis not present

## 2022-10-07 LAB — BASIC METABOLIC PANEL
Anion gap: 10 (ref 5–15)
BUN: 13 mg/dL (ref 6–20)
CO2: 24 mmol/L (ref 22–32)
Calcium: 8.2 mg/dL — ABNORMAL LOW (ref 8.9–10.3)
Chloride: 103 mmol/L (ref 98–111)
Creatinine, Ser: 0.91 mg/dL (ref 0.44–1.00)
GFR, Estimated: >60 mL/min (ref >60)
Glucose, Bld: 82 mg/dL (ref 70–99)
Potassium: 4.1 mmol/L (ref 3.5–5.1)
Sodium: 137 mmol/L (ref 135–145)

## 2022-10-07 MED ORDER — CHLORHEXIDINE GLUCONATE CLOTH 2 % EX PADS: 6.0000 | MEDICATED_PAD | Freq: Once | CUTANEOUS | Status: DC

## 2022-10-07 NOTE — Progress Notes (Signed)
Reviewed EKG with Dr. Renold Don, no concerns for scheduled surgery.

## 2022-10-07 NOTE — Progress Notes (Signed)

## 2022-10-08 ENCOUNTER — Other Ambulatory Visit: Payer: Self-pay | Admitting: General Surgery

## 2022-10-08 ENCOUNTER — Ambulatory Visit
Admission: RE | Admit: 2022-10-08 | Discharge: 2022-10-08 | Disposition: A | Discharge status: Home / Self Care | Payer: Medicare HMO | Source: Ambulatory Visit | Attending: General Surgery | Admitting: General Surgery

## 2022-10-08 DIAGNOSIS — D241 Benign neoplasm of right breast: Secondary | ICD-10-CM | POA: Diagnosis not present

## 2022-10-08 HISTORY — PX: BREAST BIOPSY: SHX20

## 2022-10-09 ENCOUNTER — Ambulatory Visit (HOSPITAL_BASED_OUTPATIENT_CLINIC_OR_DEPARTMENT_OTHER): Payer: Medicare HMO | Admitting: Anesthesiology

## 2022-10-09 ENCOUNTER — Other Ambulatory Visit: Payer: Self-pay

## 2022-10-09 ENCOUNTER — Encounter (HOSPITAL_BASED_OUTPATIENT_CLINIC_OR_DEPARTMENT_OTHER): Payer: Self-pay | Admitting: General Surgery

## 2022-10-09 ENCOUNTER — Ambulatory Visit
Admission: RE | Admit: 2022-10-09 | Discharge: 2022-10-09 | Disposition: A | Discharge status: Home / Self Care | Payer: Medicare HMO | Source: Ambulatory Visit | Attending: General Surgery | Admitting: General Surgery

## 2022-10-09 ENCOUNTER — Ambulatory Visit (HOSPITAL_BASED_OUTPATIENT_CLINIC_OR_DEPARTMENT_OTHER)
Admission: RE | Admit: 2022-10-09 | Discharge: 2022-10-09 | Disposition: A | Discharge status: Home / Self Care | Payer: Medicare HMO | Attending: General Surgery | Admitting: General Surgery

## 2022-10-09 ENCOUNTER — Encounter (HOSPITAL_BASED_OUTPATIENT_CLINIC_OR_DEPARTMENT_OTHER)
Admission: RE | Disposition: A | Discharge status: Home / Self Care | Payer: Self-pay | Source: Home / Self Care | Attending: General Surgery

## 2022-10-09 DIAGNOSIS — F319 Bipolar disorder, unspecified: Secondary | ICD-10-CM

## 2022-10-09 DIAGNOSIS — D241 Benign neoplasm of right breast: Secondary | ICD-10-CM | POA: Insufficient documentation

## 2022-10-09 DIAGNOSIS — Z6841 Body Mass Index (BMI) 40.0 and over, adult: Secondary | ICD-10-CM | POA: Diagnosis not present

## 2022-10-09 DIAGNOSIS — Z01818 Encounter for other preprocedural examination: Secondary | ICD-10-CM

## 2022-10-09 HISTORY — PX: BREAST LUMPECTOMY WITH RADIOACTIVE SEED LOCALIZATION: SHX6424

## 2022-10-09 SURGERY — BREAST LUMPECTOMY WITH RADIOACTIVE SEED LOCALIZATION
Anesthesia: General | Site: Breast | Laterality: Right

## 2022-10-09 MED ORDER — LIDOCAINE 2% (20 MG/ML) 5 ML SYRINGE
INTRAMUSCULAR | Status: AC
  Filled 2022-10-09: qty 5

## 2022-10-09 MED ORDER — ONDANSETRON HCL 4 MG/2ML IJ SOLN
INTRAMUSCULAR | Status: DC | PRN
  Administered 2022-10-09: 4 mg via INTRAVENOUS

## 2022-10-09 MED ORDER — DEXAMETHASONE SODIUM PHOSPHATE 10 MG/ML IJ SOLN
INTRAMUSCULAR | Status: AC
  Filled 2022-10-09: qty 1

## 2022-10-09 MED ORDER — ACETAMINOPHEN 500 MG PO TABS
1000.0000 mg | ORAL_TABLET | ORAL | Status: AC
  Administered 2022-10-09: 1000 mg via ORAL

## 2022-10-09 MED ORDER — MIDAZOLAM HCL 2 MG/2ML IJ SOLN
INTRAMUSCULAR | Status: AC
  Filled 2022-10-09: qty 2

## 2022-10-09 MED ORDER — OXYCODONE HCL 5 MG/5ML PO SOLN: 5.0000 mg | Freq: Once | ORAL | Status: DC | PRN

## 2022-10-09 MED ORDER — ACETAMINOPHEN 500 MG PO TABS: 1000.0000 mg | ORAL_TABLET | Freq: Once | ORAL | Status: DC | PRN

## 2022-10-09 MED ORDER — ACETAMINOPHEN 500 MG PO TABS
ORAL_TABLET | ORAL | Status: AC
  Filled 2022-10-09: qty 2

## 2022-10-09 MED ORDER — PHENYLEPHRINE 80 MCG/ML (10ML) SYRINGE FOR IV PUSH (FOR BLOOD PRESSURE SUPPORT)
PREFILLED_SYRINGE | INTRAVENOUS | Status: AC
  Filled 2022-10-09: qty 10

## 2022-10-09 MED ORDER — BUPIVACAINE-EPINEPHRINE (PF) 0.25% -1:200000 IJ SOLN
INTRAMUSCULAR | Status: DC | PRN
  Administered 2022-10-09: 18 mL

## 2022-10-09 MED ORDER — BUPIVACAINE-EPINEPHRINE (PF) 0.25% -1:200000 IJ SOLN
INTRAMUSCULAR | Status: AC
  Filled 2022-10-09: qty 30

## 2022-10-09 MED ORDER — CELECOXIB 200 MG PO CAPS
200.0000 mg | ORAL_CAPSULE | ORAL | Status: AC
  Administered 2022-10-09: 200 mg via ORAL

## 2022-10-09 MED ORDER — FENTANYL CITRATE (PF) 100 MCG/2ML IJ SOLN
INTRAMUSCULAR | Status: AC
  Filled 2022-10-09: qty 2

## 2022-10-09 MED ORDER — GABAPENTIN 300 MG PO CAPS
300.0000 mg | ORAL_CAPSULE | ORAL | Status: AC
  Administered 2022-10-09: 300 mg via ORAL

## 2022-10-09 MED ORDER — ACETAMINOPHEN 160 MG/5ML PO SOLN: 1000.0000 mg | Freq: Once | ORAL | Status: DC | PRN

## 2022-10-09 MED ORDER — GABAPENTIN 300 MG PO CAPS
ORAL_CAPSULE | ORAL | Status: AC
  Filled 2022-10-09: qty 1

## 2022-10-09 MED ORDER — PHENYLEPHRINE HCL (PRESSORS) 10 MG/ML IV SOLN
INTRAVENOUS | Status: DC | PRN
  Administered 2022-10-09: 160 ug via INTRAVENOUS

## 2022-10-09 MED ORDER — PROPOFOL 10 MG/ML IV BOLUS
INTRAVENOUS | Status: DC | PRN
Start: 2022-10-09 — End: 2022-10-09
  Administered 2022-10-09: 200 mg via INTRAVENOUS

## 2022-10-09 MED ORDER — ATROPINE SULFATE 0.4 MG/ML IV SOLN
INTRAVENOUS | Status: AC
  Filled 2022-10-09: qty 1

## 2022-10-09 MED ORDER — SUCCINYLCHOLINE CHLORIDE 200 MG/10ML IV SOSY
PREFILLED_SYRINGE | INTRAVENOUS | Status: AC
  Filled 2022-10-09: qty 10

## 2022-10-09 MED ORDER — FENTANYL CITRATE (PF) 100 MCG/2ML IJ SOLN: 25.0000 ug | INTRAMUSCULAR | Status: DC | PRN

## 2022-10-09 MED ORDER — CEFAZOLIN SODIUM-DEXTROSE 2-4 GM/100ML-% IV SOLN
INTRAVENOUS | Status: AC
  Filled 2022-10-09: qty 100

## 2022-10-09 MED ORDER — DEXAMETHASONE SODIUM PHOSPHATE 4 MG/ML IJ SOLN
INTRAMUSCULAR | Status: DC | PRN
  Administered 2022-10-09: 5 mg via INTRAVENOUS

## 2022-10-09 MED ORDER — OXYCODONE HCL 5 MG PO TABS: 5.0000 mg | ORAL_TABLET | Freq: Four times a day (QID) | ORAL | 0 refills | Status: DC | PRN

## 2022-10-09 MED ORDER — CEFAZOLIN SODIUM-DEXTROSE 2-4 GM/100ML-% IV SOLN
2.0000 g | INTRAVENOUS | Status: AC
  Administered 2022-10-09: 2 g via INTRAVENOUS

## 2022-10-09 MED ORDER — OXYCODONE HCL 5 MG PO TABS: 5.0000 mg | ORAL_TABLET | Freq: Once | ORAL | Status: DC | PRN

## 2022-10-09 MED ORDER — ONDANSETRON HCL 4 MG/2ML IJ SOLN
INTRAMUSCULAR | Status: AC
  Filled 2022-10-09: qty 2

## 2022-10-09 MED ORDER — CELECOXIB 200 MG PO CAPS
ORAL_CAPSULE | ORAL | Status: AC
  Filled 2022-10-09: qty 1

## 2022-10-09 MED ORDER — ACETAMINOPHEN 10 MG/ML IV SOLN: 1000.0000 mg | Freq: Once | INTRAVENOUS | Status: DC | PRN

## 2022-10-09 MED ORDER — MIDAZOLAM HCL 5 MG/5ML IJ SOLN
INTRAMUSCULAR | Status: DC | PRN
  Administered 2022-10-09: 2 mg via INTRAVENOUS

## 2022-10-09 MED ORDER — FENTANYL CITRATE (PF) 100 MCG/2ML IJ SOLN
INTRAMUSCULAR | Status: DC | PRN
  Administered 2022-10-09 (×2): 50 ug via INTRAVENOUS
  Administered 2022-10-09: 25 ug via INTRAVENOUS

## 2022-10-09 MED ORDER — EPHEDRINE 5 MG/ML INJ
INTRAVENOUS | Status: AC
  Filled 2022-10-09: qty 5

## 2022-10-09 MED ORDER — LIDOCAINE HCL (CARDIAC) PF 100 MG/5ML IV SOSY
PREFILLED_SYRINGE | INTRAVENOUS | Status: DC | PRN
  Administered 2022-10-09: 50 mg via INTRAVENOUS

## 2022-10-09 MED ORDER — 0.9 % SODIUM CHLORIDE (POUR BTL) OPTIME
TOPICAL | Status: DC | PRN
  Administered 2022-10-09: 1000 mL

## 2022-10-09 MED ORDER — LACTATED RINGERS IV SOLN: INTRAVENOUS | Status: DC

## 2022-10-09 SURGICAL SUPPLY — 39 items
ADH SKN CLS APL DERMABOND .7 (GAUZE/BANDAGES/DRESSINGS) ×1
APL PRP STRL LF DISP 70% ISPRP (MISCELLANEOUS) ×1
APPLIER CLIP 9.375 MED OPEN (MISCELLANEOUS)
APR CLP MED 9.3 20 MLT OPN (MISCELLANEOUS)
BLADE SURG 15 STRL LF DISP TIS (BLADE) ×2 IMPLANT
BLADE SURG 15 STRL SS (BLADE) ×1
CANISTER SUC SOCK COL 7IN (MISCELLANEOUS) ×2 IMPLANT
CANISTER SUCT 1200ML W/VALVE (MISCELLANEOUS) ×2 IMPLANT
CHLORAPREP W/TINT 26 (MISCELLANEOUS) ×2 IMPLANT
CLIP APPLIE 9.375 MED OPEN (MISCELLANEOUS) IMPLANT
COVER BACK TABLE 60X90IN (DRAPES) ×2 IMPLANT
COVER MAYO STAND STRL (DRAPES) ×2 IMPLANT
COVER PROBE CYLINDRICAL 5X96 (MISCELLANEOUS) ×2 IMPLANT
DERMABOND ADVANCED .7 DNX12 (GAUZE/BANDAGES/DRESSINGS) ×2 IMPLANT
DRAPE LAPAROSCOPIC ABDOMINAL (DRAPES) ×2 IMPLANT
DRAPE UTILITY XL STRL (DRAPES) ×2 IMPLANT
ELECT COATED BLADE 2.86 ST (ELECTRODE) ×2 IMPLANT
ELECT REM PT RETURN 9FT ADLT (ELECTROSURGICAL) ×1
ELECTRODE REM PT RTRN 9FT ADLT (ELECTROSURGICAL) ×2 IMPLANT
GLOVE BIO SURGEON STRL SZ7.5 (GLOVE) ×4 IMPLANT
GOWN STRL REUS W/ TWL LRG LVL3 (GOWN DISPOSABLE) ×4 IMPLANT
GOWN STRL REUS W/TWL LRG LVL3 (GOWN DISPOSABLE) ×2
KIT MARKER MARGIN INK (KITS) ×2 IMPLANT
NDL HYPO 25X1 1.5 SAFETY (NEEDLE) IMPLANT
NEEDLE HYPO 25X1 1.5 SAFETY (NEEDLE) IMPLANT
NS IRRIG 1000ML POUR BTL (IV SOLUTION) IMPLANT
PACK BASIN DAY SURGERY FS (CUSTOM PROCEDURE TRAY) ×2 IMPLANT
PENCIL SMOKE EVACUATOR (MISCELLANEOUS) ×2 IMPLANT
SLEEVE SCD COMPRESS KNEE MED (STOCKING) ×2 IMPLANT
SPIKE FLUID TRANSFER (MISCELLANEOUS) IMPLANT
SPONGE T-LAP 18X18 ~~LOC~~+RFID (SPONGE) ×2 IMPLANT
SUT MON AB 4-0 PC3 18 (SUTURE) ×2 IMPLANT
SUT SILK 2 0 SH (SUTURE) IMPLANT
SUT VICRYL 3-0 CR8 SH (SUTURE) ×2 IMPLANT
SYR CONTROL 10ML LL (SYRINGE) IMPLANT
TOWEL GREEN STERILE FF (TOWEL DISPOSABLE) ×2 IMPLANT
TRAY FAXITRON CT DISP (TRAY / TRAY PROCEDURE) ×2 IMPLANT
TUBE CONNECTING 20X1/4 (TUBING) ×2 IMPLANT
YANKAUER SUCT BULB TIP NO VENT (SUCTIONS) IMPLANT

## 2022-10-09 NOTE — Interval H&P Note (Signed)
History and Physical Interval Note:  10/09/2022 8:35 AM  Brenda Hernandez  has presented today for surgery, with the diagnosis of RIGHT BREAST PAPILLOMA.  The various methods of treatment have been discussed with the patient and family. After consideration of risks, benefits and other options for treatment, the patient has consented to  Procedure(s): RIGHT BREAST LUMPECTOMY WITH RADIOACTIVE SEED LOCALIZATION (Right) as a surgical intervention.  The patient's history has been reviewed, patient examined, no change in status, stable for surgery.  I have reviewed the patient's chart and labs.  Questions were answered to the patient's satisfaction.     [name]

## 2022-10-09 NOTE — Discharge Instructions (Addendum)
  Post Anesthesia Home Care Instructions  Activity: Get plenty of rest for the remainder of the day. A responsible individual must stay with you for 24 hours following the procedure.  For the next 24 hours, DO NOT: -Drive a car -Advertising copywriter -Drink alcoholic beverages -Take any medication unless instructed by your physician -Make any legal decisions or sign important papers.  Meals: Start with liquid foods such as gelatin or soup. Progress to regular foods as tolerated. Avoid greasy, spicy, heavy foods. If nausea and/or vomiting occur, drink only clear liquids until the nausea and/or vomiting subsides. Call your physician if vomiting continues.  Special Instructions/Symptoms: Your throat may feel dry or sore from the anesthesia or the breathing tube placed in your throat during surgery. If this causes discomfort, gargle with warm salt water. The discomfort should disappear within 24 hours.  Tylenol can be taken after 1:18 pm if needed Ibuprofen can be taken after 3:18 pm if needed

## 2022-10-09 NOTE — Anesthesia Preprocedure Evaluation (Signed)
Anesthesia Evaluation  Patient identified by MRN, date of birth, ID band Patient awake    Reviewed: Allergy & Precautions, NPO status , Patient's Chart, lab work & pertinent test results  History of Anesthesia Complications (+) history of anesthetic complications  Airway Mallampati: III  TM Distance: >3 FB Neck ROM: Full    Dental  (+) Dental Advisory Given, Teeth Intact   Pulmonary neg pulmonary ROS   breath sounds clear to auscultation       Cardiovascular negative cardio ROS  Rhythm:Regular     Neuro/Psych  PSYCHIATRIC DISORDERS   Bipolar Disorder Schizophrenia  negative neurological ROS     GI/Hepatic Neg liver ROS,GERD  ,,  Endo/Other    Morbid obesity  Renal/GU negative Renal ROS     Musculoskeletal negative musculoskeletal ROS (+)    Abdominal   Peds  Hematology negative hematology ROS (+) Lab Results      Component                Value               Date                      WBC                      8.8                 10/02/2022                HGB                      12.3                10/02/2022                HCT                      36.8                10/02/2022                MCV                      90.6                10/02/2022                PLT                      250                 10/02/2022                Anesthesia Other Findings   Reproductive/Obstetrics Lab Results      Component                Value               Date                      PREGTESTUR               Negative            05/04/2012  Anesthesia Physical Anesthesia Plan  ASA: 3  Anesthesia Plan: General   Post-op Pain Management: Tylenol PO (pre-op)*, Celebrex PO (pre-op)* and Gabapentin PO (pre-op)*   Induction: Intravenous  PONV Risk Score and Plan: Ondansetron, Dexamethasone, Propofol infusion and TIVA  Airway Management Planned:  LMA  Additional Equipment: None  Intra-op Plan:   Post-operative Plan: Extubation in OR  Informed Consent: I have reviewed the patients History and Physical, chart, labs and discussed the procedure including the risks, benefits and alternatives for the proposed anesthesia with the patient or authorized representative who has indicated his/her understanding and acceptance.     Dental advisory given  Plan Discussed with: CRNA  Anesthesia Plan Comments:        Anesthesia Quick Evaluation

## 2022-10-09 NOTE — Anesthesia Postprocedure Evaluation (Signed)
Anesthesia Post Note  Patient: Neurosurgeon  Procedure(s) Performed: RIGHT BREAST LUMPECTOMY WITH RADIOACTIVE SEED LOCALIZATION (Right: Breast)     Patient location during evaluation: PACU Anesthesia Type: General Level of consciousness: awake and alert Pain management: pain level controlled Vital Signs Assessment: post-procedure vital signs reviewed and stable Respiratory status: spontaneous breathing, nonlabored ventilation and respiratory function stable Cardiovascular status: blood pressure returned to baseline and stable Postop Assessment: no apparent nausea or vomiting Anesthetic complications: no  No notable events documented.  Last Vitals:  Vitals:   10/09/22 1030 10/09/22 1101  BP: (!) 140/94 (!) 140/91  Pulse: 89 93  Resp: (!) 22 18  Temp:  (!) 36.2 C  SpO2: 93% 97%    Last Pain:  Vitals:   10/09/22 1101  TempSrc:   PainSc: 0-No pain                 Brenda Hernandez

## 2022-10-09 NOTE — Op Note (Signed)
10/09/2022  9:45 AM  PATIENT:  Brenda Hernandez  47 y.o. female  PRE-OPERATIVE DIAGNOSIS:  RIGHT BREAST PAPILLOMA  POST-OPERATIVE DIAGNOSIS:  RIGHT BREAST PAPILLOMA  PROCEDURE:  Procedure(s): RIGHT BREAST LUMPECTOMY WITH RADIOACTIVE SEED LOCALIZATION (Right)  SURGEON:  Surgeons and Role:    * Griselda Miner, MD - Primary  PHYSICIAN ASSISTANT:   ASSISTANTS: none   ANESTHESIA:   local and general  EBL:  10 mL   BLOOD ADMINISTERED:none  DRAINS: none   LOCAL MEDICATIONS USED:  MARCAINE     SPECIMEN:  Source of Specimen:  right breast tissue  DISPOSITION OF SPECIMEN:  PATHOLOGY  COUNTS:  YES  TOURNIQUET:  * No tourniquets in log *  DICTATION: .Dragon Dictation  After informed consent was obtained the patient was brought to the operating room and placed in the supine position on the operating table.  After adequate induction of general anesthesia the patient's right breast was prepped with ChloraPrep, allowed to dry, and draped in usual sterile manner.  An appropriate timeout was performed.  Previously an I-125 seed was placed in the lower outer subareolar right breast to mark an area of intraductal papilloma.  Patient also had nipple discharge as her presenting symptoms.  I was able to identify a duct in the lower portion of the nipple that seem to be the most prominent drainage when the breast was compressed.  I was able to recannulate this duct with a small silver probe.  The area around the radioactive seed was then infiltrated with quarter percent Marcaine.  A curvilinear incision was then made along the lower outer edge of the areola with a 15 blade knife.  The incision was carried through the skin and subcutaneous tissue sharply with electrocautery.  Dissection was then carried around the radiograph to see while checking the area of radioactivity frequently.  I was also able to palpate the silver probe at this point and it appeared to be also in the same quadrant in the general  area of the seed.  Once the tissue was removed from the patient it was then oriented with the appropriate paint colors.  A specimen radiograph was obtained that showed the clip and seed to be within the specimen.  The specimen was then sent to pathology for further evaluation.  Hemostasis was achieved using the Bovie electrocautery.  The wound was irrigated with saline and infiltrated with more quarter percent Marcaine.  The deep layer of the incision was then closed with interrupted 3-0 Vicryl stitches.  The skin was then closed with interrupted 4-0 Monocryl subcuticular stitches.  Dermabond dressings were then applied.  The patient tolerated the procedure well.  At the end of the case all needle sponge and instrument counts were correct.  The patient was then awakened and taken to recovery in stable condition.  PLAN OF CARE: Discharge to home after PACU  PATIENT DISPOSITION:  PACU - hemodynamically stable.   Delay start of Pharmacological VTE agent (>24hrs) due to surgical blood loss or risk of bleeding: not applicable

## 2022-10-09 NOTE — Transfer of Care (Signed)
Immediate Anesthesia Transfer of Care Note  Patient: Brenda Hernandez  Procedure(s) Performed: RIGHT BREAST LUMPECTOMY WITH RADIOACTIVE SEED LOCALIZATION (Right: Breast)  Patient Location: PACU  Anesthesia Type:General  Level of Consciousness: drowsy and patient cooperative  Airway & Oxygen Therapy: Patient Spontanous Breathing and Patient connected to face mask oxygen  Post-op Assessment: Report given to RN and Post -op Vital signs reviewed and stable  Post vital signs: Reviewed and stable  Last Vitals:  Vitals Value Taken Time  BP 130/92 10/09/22 0949  Temp    Pulse 87 10/09/22 0950  Resp 25 10/09/22 0950  SpO2 100 % 10/09/22 0950  Vitals shown include unfiled device data.  Last Pain:  Vitals:   10/09/22 0713  TempSrc: Temporal  PainSc: 0-No pain      Patients Stated Pain Goal: 3 (10/09/22 0713)  Complications: No notable events documented.

## 2022-10-09 NOTE — Anesthesia Procedure Notes (Signed)
Procedure Name: LMA Insertion Date/Time: 10/09/2022 8:58 AM  Performed by: Ronnette Hila, CRNAPre-anesthesia Checklist: Patient identified, Emergency Drugs available, Suction available and Patient being monitored Patient Re-evaluated:Patient Re-evaluated prior to induction Oxygen Delivery Method: Circle System Utilized Preoxygenation: Pre-oxygenation with 100% oxygen Induction Type: IV induction Ventilation: Mask ventilation without difficulty LMA: LMA inserted LMA Size: 4.0 Number of attempts: 1 Airway Equipment and Method: bite block Placement Confirmation: positive ETCO2 Tube secured with: Tape Dental Injury: Teeth and Oropharynx as per pre-operative assessment

## 2022-10-09 NOTE — H&P (Signed)
REFERRING PHYSICIAN: Plotnikov, Georgina Quint, MD PROVIDER: Lindell Noe, MD MRN: [id number] DOB: [birth year] Subjective   Chief Complaint: New Consultation (RT BREAST)  History of Present Illness: Brenda Hernandez is a 47 y.o. female who is seen today as an office consultation for evaluation of New Consultation (RT BREAST)  We are asked to see the patient in consultation by Dr. Posey Rea to evaluate her for discharge from the right nipple. The patient is a 47 year old black female who has been experiencing some intermittent discharge from the right nipple for about the last 6 to 8 months. She describes the discharge as sometimes clear and sometimes yellow and sometimes bloody. She denies any breast pain. She underwent a mammogram and ultrasound which did show a 9 mm intraductal mass in the lateral subareolar right breast. This was biopsied and came back as a papilloma. She denies any family history of breast cancer. She is otherwise in good health and does not smoke.  Review of Systems: A complete review of systems was obtained from the patient. I have reviewed this information and discussed as appropriate with the patient. See HPI as well for other ROS.  ROS   Medical History: Past Medical History:  Diagnosis Date  GERD (gastroesophageal reflux disease)  OCD (obsessive compulsive disorder) 11/19/2011  Schizoaffective disorder, bipolar type (CMS/HHS-HCC)  Schizoaffective disorder, chronic condition with acute exacerbation (CMS/HHS-HCC) 11/19/2011  Haldol, Risperidone, Prolixin, Abilify, Luvox,   Patient Active Problem List  Diagnosis  Schizoaffective disorder, chronic condition with acute exacerbation (CMS/HHS-HCC)  OCD (obsessive compulsive disorder)  HTN (hypertension)  Metabolic syndrome  Intraductal papilloma of right breast   History reviewed. No pertinent surgical history.   No Known Allergies  Current Outpatient Medications on File Prior to Visit  Medication Sig Dispense  Refill  aspirin 81 MG chewable tablet Take 81 mg by mouth once daily  clomiPRAMINE (ANAFRANIL) 75 MG capsule Take 75 mg by mouth 2 (two) times daily  cloZAPine (CLOZARIL) 100 MG tablet Take by mouth  divalproex (DEPAKOTE ER) 500 MG ER tablet TAKE FOUR TABLETS DAILY IN THE EVENING  ibuprofen (MOTRIN) 400 MG tablet Take by mouth  metFORMIN (GLUCOPHAGE) 1000 MG tablet Take 1,000 mg by mouth 2 (two) times daily with meals  valproic acid (DEPAKENE) 250 mg/5 mL (5 mL) oral solution Take 50 mLs (2,500 mg total) by mouth nightly. 1500 mL 0  valproic acid (DEPAKENE) 250 mg/5 mL oral solution TAKE 50 MLS AT BEDTIME   No current facility-administered medications on file prior to visit.   Family History  Problem Relation Age of Onset  Bipolar disorder Paternal Aunt    Social History   Tobacco Use  Smoking Status Never  Smokeless Tobacco Never    Social History   Socioeconomic History  Marital status: Single  Spouse name: Kermit  Number of children: 0  Years of education: 12  Occupational History  Occupation: Disabled  Tobacco Use  Smoking status: Never  Smokeless tobacco: Never  Vaping Use  Vaping status: Never Used  Substance and Sexual Activity  Alcohol use: No  Drug use: No  Sexual activity: Yes  Partners: Male  Comment: Trying to conceive   Social Determinants of Health   Financial Resource Strain: Low Risk (10/31/2021)  Received from [facility]  Overall Financial Resource Strain (CARDIA)  Difficulty of Paying Living Expenses: Not hard at all  Food Insecurity: No Food Insecurity (06/06/2022)  Received from [facility]  Hunger Vital Sign  Worried About Running Out of Food in the Last  Year: Never true  Ran Out of Food in the Last Year: Never true  Transportation Needs: No Transportation Needs (06/06/2022)  Received from [facility] - Transportation  Lack of Transportation (Medical): No  Lack of Transportation (Non-Medical): No  Physical Activity: Inactive  (06/06/2022)  Received from [facility]  Exercise Vital Sign  Days of Exercise per Week: 0 days  Minutes of Exercise per Session: 30 min  Stress: No Stress Concern Present (06/06/2022)  Received from [facility] - Occupational Stress Questionnaire  Feeling of Stress : Not at all  Social Connections: Moderately Integrated (06/06/2022)  Received from [facility]  Social Connection and Isolation Panel [NHANES]  Frequency of Communication with Friends and Family: More than three times a week  Frequency of Social Gatherings with Friends and Family: Three times a week  Attends Religious Services: More than 4 times per year  Active Member of Clubs or Organizations: Yes  Attends Banker Meetings: 1 to 4 times per year  Marital Status: Widowed   Objective:   Vitals:  BP: 110/80  Pulse: 89  Temp: 36.7 C (98 F)  SpO2: 97%  Weight: (!) 118.5 kg (261 lb 3.2 oz)  Height: 162.6 cm (5\' 4" )  PainSc: 0-No pain   Body mass index is 44.83 kg/m.  Physical Exam Vitals reviewed.  Constitutional:  General: She is not in acute distress. Appearance: Normal appearance.  HENT:  Head: Normocephalic and atraumatic.  Right Ear: External ear normal.  Left Ear: External ear normal.  Nose: Nose normal.  Mouth/Throat:  Mouth: Mucous membranes are moist.  Pharynx: Oropharynx is clear.  Eyes:  General: No scleral icterus. Extraocular Movements: Extraocular movements intact.  Conjunctiva/sclera: Conjunctivae normal.  Pupils: Pupils are equal, round, and reactive to light.  Cardiovascular:  Rate and Rhythm: Normal rate and regular rhythm.  Pulses: Normal pulses.  Heart sounds: Normal heart sounds.  Pulmonary:  Effort: Pulmonary effort is normal. No respiratory distress.  Breath sounds: Normal breath sounds.  Abdominal:  General: Bowel sounds are normal.  Palpations: Abdomen is soft.  Tenderness: There is no abdominal tenderness.   Musculoskeletal:  General: No swelling, tenderness or deformity. Normal range of motion.  Cervical back: Normal range of motion and neck supple.  Skin: General: Skin is warm and dry.  Coloration: Skin is not jaundiced.  Neurological:  General: No focal deficit present.  Mental Status: She is alert and oriented to person, place, and time.  Psychiatric:  Mood and Affect: Mood normal.  Behavior: Behavior normal.     Breast: There is no palpable mass in either breast. There is no palpable axillary, supraclavicular, or cervical lymphadenopathy. I cannot reproduce the discharge with palpation of the breast today.  Labs, Imaging and Diagnostic Testing:  Assessment and Plan:   Diagnoses and all orders for this visit:  Intraductal papilloma of right breast   The patient appears to have a 9 mm intraductal papilloma in the lateral subareolar right breast that may be the source of the discharge. Because of its size and appearance and because the discharge has been bloody the recommendation is to have this area removed. She would also like to have this done. I have discussed with her in detail the risks and benefits of the operation as well as some of the technical aspects including the use of a radioactive seed for localization and she understands and wishes to proceed.

## 2022-10-12 ENCOUNTER — Encounter (HOSPITAL_BASED_OUTPATIENT_CLINIC_OR_DEPARTMENT_OTHER): Payer: Self-pay | Admitting: General Surgery

## 2022-10-26 NOTE — Progress Notes (Signed)
.smr  Office: [phone]  /  Fax: [phone]  WEIGHT SUMMARY AND BIOMETRICS  Vitals Temp: (!) 97.5 F (36.4 C) BP: 138/86 Pulse Rate: 86 SpO2: 97 %   Anthropometric Measurements Height: 5\' 3"  (1.6 m) Weight: 260 lb (117.9 kg) BMI (Calculated): 46.07 Weight at Last Visit: 260 lb Weight Lost Since Last Visit: 0 lb Weight Gained Since Last Visit: 0 lb Starting Weight: 287 lb Total Weight Loss (lbs): 27 lb (12.2 kg)   Body Composition  Body Fat %: 54.8 % Fat Mass (lbs): 142.6 lbs Muscle Mass (lbs): 111.8 lbs Visceral Fat Rating : 18   Other Clinical Data Fasting: no Labs: no Today's Visit #: 40 Starting Date: 03/26/20     HPI  Chief Complaint: OBESITY  Brenda Hernandez is here to discuss her progress with her obesity treatment plan. She is on the the Category 2 Plan and states she is following her eating plan approximately 45 % of the time. She states she is exercising walking 30  minutes 7 times per week.   Interval History:  Since last office visit she maintained weight.  Bio impedence scale was reviewed with the patient:  Up 4 lbs in muscle mass Down 4.4 lbs adipose mass Visceral adipose stable at 18  Hunger/appetite-Reports trying to make better choices. Continues to fixate on certain foods for a period of time and has difficulty not over eating- like with Uncrustables or Ramen noodles. Discussed needs to improve protein intake with goal of 85 grams daily   She has been working with her therapist on this behavior to minimize Uncrustable to 2/day  Cravings- Allows herself ice cream once weekly. Discussed healthy alternatives Exercise-walking more and not having to use her walker or cane as much recently.  Hydration-trying to do better with drinking enough. She has discovered she does like [employer] skim milk !   She had a breast biopsy 10/09/22- Path : A. BREAST, RIGHT W/SEED, LUMPECTOMY:  - Benign intraductal papilloma  - No malignancy identified    Pharmacotherapy: metformin 1000 mg twice daily. Prescribed by psychiatry. No GI or other side effects with metformin.   TREATMENT PLAN FOR OBESITY: Down 27 lbs overall TBW loss 9.41% since 03/26/20  Recommended Dietary Goals  Brenda Hernandez is currently in the action stage of change. As such, her goal is to continue weight management plan. She has agreed to the Category 2 Plan.  Behavioral Intervention  We discussed the following Behavioral Modification Strategies today: increasing lean protein intake, decreasing simple carbohydrates , increasing vegetables, increasing lower glycemic fruits, avoiding skipping meals, increasing water intake, keeping healthy foods at home, decreasing eating out or consumption of processed foods, and making healthy choices when eating convenient foods, emotional eating strategies and understanding the difference between hunger signals and cravings, continue to practice mindfulness when eating, planning for success, and better snacking choices.  Additional resources provided today: Provided High protein foods hand out  Recommended Physical Activity Goals  Brenda Hernandez has been advised to work up to 150 minutes of moderate intensity aerobic activity a week and strengthening exercises 2-3 times per week for cardiovascular health, weight loss maintenance and preservation of muscle mass.   She has agreed to Continue current level of physical activity  and Increase the intensity, frequency or duration of aerobic exercises   Discussed and provided information concerning [facility] Right start programs  Pharmacotherapy We discussed various medication options to help Brenda Hernandez with her weight loss efforts and we both agreed to continue metformin for primary indication of prediabetes.  Return in about 4 weeks (around 11/24/2022).Marland Kitchen She was informed of the importance of frequent follow up visits to maximize her success with intensive lifestyle modifications for her multiple health  conditions.  PHYSICAL EXAM:  Blood pressure 138/86, pulse 86, temperature (!) 97.5 F (36.4 C), height 5\' 3"  (1.6 m), weight 260 lb (117.9 kg), last menstrual period 10/07/2022, SpO2 97%. Body mass index is 46.06 kg/m.  General: She is overweight, cooperative, alert, well developed, and in no acute distress. PSYCH: Has normal mood, affect and thought process.   Cardiovascular: HR 80's regular, BP 138/86 Lungs: Normal breathing effort, no conversational dyspnea.  DIAGNOSTIC DATA REVIEWED:  BMET    Component Value Date/Time   NA 137 10/07/2022 1100   NA 139 06/17/2022 1436   K 4.1 10/07/2022 1100   CL 103 10/07/2022 1100   CO2 24 10/07/2022 1100   GLUCOSE 82 10/07/2022 1100   BUN 13 10/07/2022 1100   BUN 9 06/17/2022 1436   CREATININE 0.91 10/07/2022 1100   CALCIUM 8.2 (L) 10/07/2022 1100   GFRNONAA >60 10/07/2022 1100   GFRAA 98 03/26/2020 1012   Lab Results  Component Value Date   HGBA1C 5.6 06/17/2022   HGBA1C 5.7 (H) 09/01/2014   Lab Results  Component Value Date   INSULIN 9.9 06/17/2022   INSULIN 24.7 03/26/2020   Lab Results  Component Value Date   TSH 1.330 06/17/2022   CBC    Component Value Date/Time   WBC 8.8 10/02/2022 1110   RBC 4.06 10/02/2022 1110   HGB 12.3 10/02/2022 1110   HGB 12.6 03/26/2020 1012   HCT 36.8 10/02/2022 1110   HCT 38.2 03/26/2020 1012   PLT 250 10/02/2022 1110   PLT 298 03/26/2020 1012   MCV 90.6 10/02/2022 1110   MCV 88 03/26/2020 1012   MCH 30.3 10/02/2022 1110   MCHC 33.4 10/02/2022 1110   RDW 15.4 10/02/2022 1110   RDW 15.8 (H) 03/26/2020 1012   Iron Studies No results found for: "IRON", "TIBC", "FERRITIN", "IRONPCTSAT" Lipid Panel     Component Value Date/Time   CHOL 168 06/17/2022 1436   TRIG 84 06/17/2022 1436   HDL 54 06/17/2022 1436   CHOLHDL 2.6 11/01/2021 1120   VLDL 19 11/01/2021 1120   LDLCALC 98 06/17/2022 1436   Hepatic Function Panel     Component Value Date/Time   PROT 6.1 06/17/2022 1436    ALBUMIN 3.5 (L) 06/17/2022 1436   AST 16 06/17/2022 1436   ALT 9 06/17/2022 1436   ALKPHOS 70 06/17/2022 1436   BILITOT 0.2 06/17/2022 1436      Component Value Date/Time   TSH 1.330 06/17/2022 1436   Nutritional Lab Results  Component Value Date   VD25OH 31.9 06/17/2022   VD25OH 52.44 12/28/2020   VD25OH 46.8 12/05/2020    ASSOCIATED CONDITIONS ADDRESSED TODAY  ASSESSMENT AND PLAN  Problem List Items Addressed This Visit     Pre-diabetes - Primary   Vitamin D deficiency   Low serum vitamin B12   Morbid obesity (HCC)   BMI 45.0-49.9, adult (HCC)   Prediabetes Last A1c was 5.6~ stable/ Insulin 9.9- improved overall.   Medication(s):  metformin 1000 mg twice daily per psychiatry  Reports no GI or other side effects with metformin.  Polyphagia:No Lab Results  Component Value Date   HGBA1C 5.6 06/17/2022   HGBA1C 5.2 11/01/2021   HGBA1C 5.6 06/28/2021   HGBA1C 5.6 06/18/2021   HGBA1C 5.5 12/28/2020   Lab Results  Component Value  Date   INSULIN 9.9 06/17/2022   INSULIN 14.1 06/18/2021   INSULIN 6.0 12/05/2020   INSULIN 10.8 07/18/2020   INSULIN 24.7 03/26/2020    Plan: Continue  metformin 1000 mg twice daily per psychiatry.  Continue working on nutrition plan to decrease simple carbohydrates, increase lean proteins and exercise to promote weight loss, improve glycemic control and prevent progression to Type 2 diabetes.  Recheck A1c over the next 1-2 months.   Vitamin D Deficiency Vitamin D is not at goal of 50.  Most recent vitamin D level was 31.9. She is on  prescription ergocalciferol 50,000 IU weekly. No N/V, muscle weakness or other side effects with vitamin D.  Lab Results  Component Value Date   VD25OH 31.9 06/17/2022   VD25OH 52.44 12/28/2020   VD25OH 46.8 12/05/2020    Plan: Continue  prescription ergocalciferol 50,000 IU weekly No refill needed this visit.  Low vitamin D levels can be associated with adiposity and may result in leptin  resistance and weight gain. Also associated with fatigue. Currently on vitamin D supplementation without any adverse effects.  Recheck vitamin D level in next 1-2 months to optimize supplementation/avoid over supplementation.   Low serum B 12 level:  On B 12 500 mcg daily . No side effects. Last B 12 level  Plan: Continue B 12 and recheck level in next 1-2 months.   ATTESTASTION STATEMENTS:  Reviewed by clinician on day of visit: allergies, medications, problem list, medical history, surgical history, family history, social history, and previous encounter notes.   I have personally spent 34 minutes total time today in preparation, patient care, nutritional counseling and documentation for this visit, including the following: review of clinical lab tests; review of medical tests/procedures/services.      Brenda Lamagna, PA-C

## 2022-10-27 ENCOUNTER — Encounter (INDEPENDENT_AMBULATORY_CARE_PROVIDER_SITE_OTHER): Payer: Self-pay | Admitting: Physician Assistant

## 2022-10-27 ENCOUNTER — Ambulatory Visit (INDEPENDENT_AMBULATORY_CARE_PROVIDER_SITE_OTHER): Payer: Medicare HMO | Admitting: Physician Assistant

## 2022-10-27 VITALS — BP 138/86 | HR 86 | Temp 97.5°F | Ht 63.0 in | Wt 260.0 lb

## 2022-10-27 DIAGNOSIS — R7303 Prediabetes: Secondary | ICD-10-CM | POA: Diagnosis not present

## 2022-10-27 DIAGNOSIS — E538 Deficiency of other specified B group vitamins: Secondary | ICD-10-CM | POA: Diagnosis not present

## 2022-10-27 DIAGNOSIS — Z6841 Body Mass Index (BMI) 40.0 and over, adult: Secondary | ICD-10-CM | POA: Diagnosis not present

## 2022-10-27 DIAGNOSIS — E559 Vitamin D deficiency, unspecified: Secondary | ICD-10-CM | POA: Diagnosis not present

## 2022-10-30 ENCOUNTER — Other Ambulatory Visit (HOSPITAL_COMMUNITY)
Admission: RE | Admit: 2022-10-30 | Discharge: 2022-10-30 | Disposition: A | Discharge status: Home / Self Care | Payer: Medicare HMO | Attending: Psychology | Admitting: Psychology

## 2022-10-30 DIAGNOSIS — Z79899 Other long term (current) drug therapy: Secondary | ICD-10-CM | POA: Diagnosis not present

## 2022-10-30 LAB — CBC WITH DIFFERENTIAL/PLATELET
Abs Immature Granulocytes: 0.01 10*3/uL (ref 0.00–0.07)
Basophils Absolute: 0 10*3/uL (ref 0.0–0.1)
Basophils Relative: 0 %
Eosinophils Absolute: 0 10*3/uL (ref 0.0–0.5)
Eosinophils Relative: 0 %
HCT: 37.1 % (ref 36.0–46.0)
Hemoglobin: 12.4 g/dL (ref 12.0–15.0)
Immature Granulocytes: 0 %
Lymphocytes Relative: 46 %
Lymphs Abs: 3.8 10*3/uL (ref 0.7–4.0)
MCH: 30.2 pg (ref 26.0–34.0)
MCHC: 33.4 g/dL (ref 30.0–36.0)
MCV: 90.3 fL (ref 80.0–100.0)
Monocytes Absolute: 0.4 10*3/uL (ref 0.1–1.0)
Monocytes Relative: 5 %
Neutro Abs: 4 10*3/uL (ref 1.7–7.7)
Neutrophils Relative %: 49 %
Platelets: 274 10*3/uL (ref 150–400)
RBC: 4.11 MIL/uL (ref 3.87–5.11)
RDW: 16.1 % — ABNORMAL HIGH (ref 11.5–15.5)
WBC: 8.3 10*3/uL (ref 4.0–10.5)
nRBC: 0 % (ref 0.0–0.2)

## 2022-12-01 ENCOUNTER — Ambulatory Visit (INDEPENDENT_AMBULATORY_CARE_PROVIDER_SITE_OTHER): Payer: Medicare HMO | Admitting: Physician Assistant

## 2022-12-01 ENCOUNTER — Encounter (INDEPENDENT_AMBULATORY_CARE_PROVIDER_SITE_OTHER): Payer: Self-pay | Admitting: Physician Assistant

## 2022-12-01 VITALS — BP 134/85 | HR 102 | Temp 98.3°F | Ht 63.0 in | Wt 272.0 lb

## 2022-12-01 DIAGNOSIS — E538 Deficiency of other specified B group vitamins: Secondary | ICD-10-CM

## 2022-12-01 DIAGNOSIS — Z6841 Body Mass Index (BMI) 40.0 and over, adult: Secondary | ICD-10-CM | POA: Diagnosis not present

## 2022-12-01 DIAGNOSIS — E559 Vitamin D deficiency, unspecified: Secondary | ICD-10-CM | POA: Diagnosis not present

## 2022-12-01 DIAGNOSIS — R7303 Prediabetes: Secondary | ICD-10-CM | POA: Diagnosis not present

## 2022-12-01 MED ORDER — CYANOCOBALAMIN 500 MCG PO TABS: 500.0000 ug | ORAL_TABLET | Freq: Every day | ORAL | 0 refills | Status: DC

## 2022-12-01 MED ORDER — VITAMIN D (ERGOCALCIFEROL) 1.25 MG (50000 UNIT) PO CAPS
50000.0000 [IU] | ORAL_CAPSULE | ORAL | 0 refills | Status: DC
Start: 2022-12-01 — End: 2022-12-29

## 2022-12-01 NOTE — Progress Notes (Signed)
.smr  Office: [phone]  /  Fax: [phone]  WEIGHT SUMMARY AND BIOMETRICS  Vitals Temp: 98.3 F (36.8 C) BP: 134/85 Pulse Rate: (!) 102 SpO2: 98 %   Anthropometric Measurements Height: 5\' 3"  (1.6 m) Weight: 272 lb (123.4 kg) BMI (Calculated): 48.19 Weight at Last Visit: 260 lb Weight Lost Since Last Visit: 0 Weight Gained Since Last Visit: 12 lb Starting Weight: 287 lb Total Weight Loss (lbs): 15 lb (6.804 kg) Peak Weight: 303 lb   Body Composition  Body Fat %: 58.3 % Fat Mass (lbs): 158.8 lbs Muscle Mass (lbs): 107.8 lbs Visceral Fat Rating : 20   Other Clinical Data Fasting: yes Labs: no Today's Visit #: 40 Starting Date: 03/26/20     HPI  Chief Complaint: OBESITY  Brenda Hernandez is here to discuss her progress with her obesity treatment plan. She is on the the Category 2 Plan and states she is following her eating plan approximately 25 % of the time. She states she is exercising 0 minutes 0 times per week.   Interval History:  Since last office visit she is up 12 lbs.  Brenda Hernandez, a 47 year old female with a history of prediabetes, vitamin D deficiency, and low B12, presents for a follow-up on her obesity treatment plan. She reports struggling with weight gain despite giving up certain foods like Un[employer]ust[employer]. She admits to eating mostly at night, with recent meals consisting of Pasta Roni and frozen vegetables from [employer]. She acknowledges that her food choices, particularly the Pasta Roni, may be contributing to her weight gain. She also mentions introducing blueberry pancakes into her diet.  Brenda Hernandez reports that her physical activity has been limited due to transportation issues, which have also affected her ability to go to the gym. 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 She usually eats after getting her grandmother to bed, recognizing that this late-night eating habit may not be beneficial for her weight  loss efforts.  She has been taking metformin (1000 mg twice a day) without any side effects and has been compliant with her vitamin D and B12 supplements. She has not had her A1c checked since April.   Pharmacotherapy: metformin 1000 mg twice daily. Prescribed by psychiatry. No GI or other side effects with metformin.   Needs BMI < 40 to have knee replacement surgery   TREATMENT PLAN FOR OBESITY: Obesity Predominantly sedentary lifestyle with high carbohydrate diet. Discussed healthier food options and the importance of regular physical activity. -Encouraged to increase protein intake to 85g daily. -Recommended low carb tortillas, cottage cheese, and protein pancakes as healthier food options. -Encouraged to try YouTube exercise videos such as "Walking with Verlon Au". Recommended Dietary Goals  Brenda Hernandez is currently in the action stage of change. As such, her goal is to continue weight management plan. She has agreed to the Category 2 Plan.  Behavioral Intervention  We discussed the following Behavioral Modification Strategies today: increasing lean protein intake, decreasing simple carbohydrates , increasing vegetables, increasing lower glycemic fruits, increasing fiber rich foods, increasing water intake, work on meal planning and preparation, keeping healthy foods at home, emotional eating strategies and understanding the difference between hunger signals and cravings, work on managing stress, creating time for self-care and relaxation measures, continue to practice mindfulness when eating, and planning for success.  Additional resources provided today: NA  Recommended Physical Activity Goals  Brenda Hernandez has been advised to work up to 150 minutes of moderate intensity aerobic activity a week  and strengthening exercises 2-3 times per week for cardiovascular health, weight loss maintenance and preservation of muscle mass.   She has agreed to Think about ways to increase daily physical  activity and overcoming barriers to exercise and Increase physical activity in their day and reduce sedentary time (increase NEAT).   Pharmacotherapy We discussed various medication options to help Brenda Hernandez with her weight loss efforts and we both agreed to continue metformin for prediabetes per psychiatry.    Return in about 4 weeks (around 12/29/2022) for Fasting Lab.Marland Kitchen She was informed of the importance of frequent follow up visits to maximize her success with intensive lifestyle modifications for her multiple health conditions.  PHYSICAL EXAM:  Blood pressure 134/85, pulse (!) 102, temperature 98.3 F (36.8 C), height 5\' 3"  (1.6 m), weight 272 lb (123.4 kg), SpO2 98%. Body mass index is 48.18 kg/m.  General: She is overweight, cooperative, alert, well developed, and in no acute distress. PSYCH: Has normal mood, affect and thought process.   Cardiovascular: HR 102, BP 134/85.  Lungs: Normal breathing effort, no conversational dyspnea.  DIAGNOSTIC DATA REVIEWED:  BMET    Component Value Date/Time   NA 137 10/07/2022 1100   NA 139 06/17/2022 1436   K 4.1 10/07/2022 1100   CL 103 10/07/2022 1100   CO2 24 10/07/2022 1100   GLUCOSE 82 10/07/2022 1100   BUN 13 10/07/2022 1100   BUN 9 06/17/2022 1436   CREATININE 0.91 10/07/2022 1100   CALCIUM 8.2 (L) 10/07/2022 1100   GFRNONAA >60 10/07/2022 1100   GFRAA 98 03/26/2020 1012   Lab Results  Component Value Date   HGBA1C 5.6 06/17/2022   HGBA1C 5.7 (H) 09/01/2014   Lab Results  Component Value Date   INSULIN 9.9 06/17/2022   INSULIN 24.7 03/26/2020   Lab Results  Component Value Date   TSH 1.330 06/17/2022   CBC    Component Value Date/Time   WBC 8.3 10/30/2022 1450   RBC 4.11 10/30/2022 1450   HGB 12.4 10/30/2022 1450   HGB 12.6 03/26/2020 1012   HCT 37.1 10/30/2022 1450   HCT 38.2 03/26/2020 1012   PLT 274 10/30/2022 1450   PLT 298 03/26/2020 1012   MCV 90.3 10/30/2022 1450   MCV 88 03/26/2020 1012   MCH  30.2 10/30/2022 1450   MCHC 33.4 10/30/2022 1450   RDW 16.1 (H) 10/30/2022 1450   RDW 15.8 (H) 03/26/2020 1012   Iron Studies No results found for: "IRON", "TIBC", "FERRITIN", "IRONPCTSAT" Lipid Panel     Component Value Date/Time   CHOL 168 06/17/2022 1436   TRIG 84 06/17/2022 1436   HDL 54 06/17/2022 1436   CHOLHDL 2.6 11/01/2021 1120   VLDL 19 11/01/2021 1120   LDLCALC 98 06/17/2022 1436   Hepatic Function Panel     Component Value Date/Time   PROT 6.1 06/17/2022 1436   ALBUMIN 3.5 (L) 06/17/2022 1436   AST 16 06/17/2022 1436   ALT 9 06/17/2022 1436   ALKPHOS 70 06/17/2022 1436   BILITOT 0.2 06/17/2022 1436      Component Value Date/Time   TSH 1.330 06/17/2022 1436   Nutritional Lab Results  Component Value Date   VD25OH 31.9 06/17/2022   VD25OH 52.44 12/28/2020   VD25OH 46.8 12/05/2020    ASSOCIATED CONDITIONS ADDRESSED TODAY  ASSESSMENT AND PLAN  Problem List Items Addressed This Visit     Pre-diabetes - Primary   Vitamin D deficiency   Relevant Medications   Vitamin D, Ergocalciferol, (DRISDOL)  1.25 MG (50000 UNIT) CAPS capsule   Low serum vitamin B12   Relevant Medications   cyanocobalamin (VITAMIN B12) 500 MCG tablet   Morbid obesity (HCC)    Prediabetes On Metformin 1000mg  twice daily without side effects. Last A1C check was in April. -Continue Metformin 1000mg  twice daily- prescribed by psychiatry. -Check A1C, Vitamin D, and B12 levels on next visit (12/29/2022).  Vitamin D deficiency On Ergocalciferol 50,000 units weekly without side effects. -Continue current Ergocalciferol 50,000 units weekly and recheck vitamin D levels next visit.   Vitamin B12 deficiency On sublingual B 12 500 mcg daily supplementation without side effects. -Continue current Vitamin B12 supplementation and recheck B 12 level next visit..  General Health Maintenance Encouraged to increase protein intake and decrease simple carbohydrate intake.  -Encouraged to drink  64 to 80 ounces of sugar-free fluids daily. -Next appointment scheduled for 12/29/2022 for fasting labs.  ATTESTASTION STATEMENTS:  Reviewed by clinician on day of visit: allergies, medications, problem list, medical history, surgical history, family history, social history, and previous encounter notes.   I have personally spent 40 minutes total time today in preparation, patient care, nutritional counseling and documentation for this visit, including the following: review of clinical lab tests; review of medical tests/procedures/services.      Daiel Strohecker, PA-C

## 2022-12-03 DIAGNOSIS — F319 Bipolar disorder, unspecified: Secondary | ICD-10-CM | POA: Diagnosis not present

## 2022-12-04 ENCOUNTER — Other Ambulatory Visit (HOSPITAL_COMMUNITY)
Admission: AD | Admit: 2022-12-04 | Discharge: 2022-12-04 | Disposition: A | Discharge status: Home / Self Care | Payer: Medicare HMO | Attending: Psychology | Admitting: Psychology

## 2022-12-04 DIAGNOSIS — Z79899 Other long term (current) drug therapy: Secondary | ICD-10-CM | POA: Diagnosis not present

## 2022-12-04 LAB — CBC WITH DIFFERENTIAL/PLATELET
Abs Immature Granulocytes: 0.02 10*3/uL (ref 0.00–0.07)
Basophils Absolute: 0 10*3/uL (ref 0.0–0.1)
Basophils Relative: 0 %
Eosinophils Absolute: 0 10*3/uL (ref 0.0–0.5)
Eosinophils Relative: 0 %
HCT: 35.7 % — ABNORMAL LOW (ref 36.0–46.0)
Hemoglobin: 11.9 g/dL — ABNORMAL LOW (ref 12.0–15.0)
Immature Granulocytes: 0 %
Lymphocytes Relative: 51 %
Lymphs Abs: 3.6 10*3/uL (ref 0.7–4.0)
MCH: 30.8 pg (ref 26.0–34.0)
MCHC: 33.3 g/dL (ref 30.0–36.0)
MCV: 92.5 fL (ref 80.0–100.0)
Monocytes Absolute: 0.4 10*3/uL (ref 0.1–1.0)
Monocytes Relative: 6 %
Neutro Abs: 3 10*3/uL (ref 1.7–7.7)
Neutrophils Relative %: 43 %
Platelets: 238 10*3/uL (ref 150–400)
RBC: 3.86 MIL/uL — ABNORMAL LOW (ref 3.87–5.11)
RDW: 16.5 % — ABNORMAL HIGH (ref 11.5–15.5)
WBC: 7.1 10*3/uL (ref 4.0–10.5)
nRBC: 0 % (ref 0.0–0.2)

## 2022-12-15 ENCOUNTER — Ambulatory Visit (INDEPENDENT_AMBULATORY_CARE_PROVIDER_SITE_OTHER): Payer: Medicare HMO | Admitting: Internal Medicine

## 2022-12-15 ENCOUNTER — Encounter: Payer: Self-pay | Admitting: Internal Medicine

## 2022-12-15 VITALS — BP 122/78 | HR 95 | Temp 98.3°F | Ht 63.0 in | Wt 272.0 lb

## 2022-12-15 DIAGNOSIS — E538 Deficiency of other specified B group vitamins: Secondary | ICD-10-CM

## 2022-12-15 DIAGNOSIS — E559 Vitamin D deficiency, unspecified: Secondary | ICD-10-CM

## 2022-12-15 DIAGNOSIS — F258 Other schizoaffective disorders: Secondary | ICD-10-CM

## 2022-12-15 DIAGNOSIS — M25562 Pain in left knee: Secondary | ICD-10-CM

## 2022-12-15 DIAGNOSIS — R7303 Prediabetes: Secondary | ICD-10-CM | POA: Diagnosis not present

## 2022-12-15 DIAGNOSIS — G8929 Other chronic pain: Secondary | ICD-10-CM

## 2022-12-15 DIAGNOSIS — M25561 Pain in right knee: Secondary | ICD-10-CM

## 2022-12-15 DIAGNOSIS — D242 Benign neoplasm of left breast: Secondary | ICD-10-CM | POA: Diagnosis not present

## 2022-12-15 NOTE — Assessment & Plan Note (Signed)
On B complex 

## 2022-12-15 NOTE — Assessment & Plan Note (Signed)
Goal wt 225 lbs to qualify for surgery L knee pain/OA

## 2022-12-15 NOTE — Assessment & Plan Note (Signed)
On Vit D 

## 2022-12-15 NOTE — Assessment & Plan Note (Signed)
Cont w/Clozaril, Depakote ER

## 2022-12-15 NOTE — Progress Notes (Signed)
Subjective:  Patient ID: Brenda Hernandez, female    DOB: 09/02/1975  Age: 47 y.o. MRN: [id number]  CC: No chief complaint on file.   HPI Brenda Hernandez presents for knee OA, s/p breast bx, schizoaffective disorder, elevated glucose  Outpatient Medications Prior to Visit  Medication Sig Dispense Refill   aspirin EC 81 MG tablet Take 81 mg by mouth daily. Swallow whole.     B Complex-Folic Acid (B COMPLEX PLUS) TABS Take 1 tablet by mouth daily. 100 tablet 5   clomiPRAMINE (ANAFRANIL) 75 MG capsule Take 75 mg by mouth 2 (two) times daily.     cloZAPine (CLOZARIL) 100 MG tablet Take 5 tablets (500 mg total) by mouth daily. 150 tablet 2   cyanocobalamin (VITAMIN B12) 500 MCG tablet Take 1 tablet (500 mcg total) by mouth daily. 90 tablet 0   divalproex (DEPAKOTE ER) 500 MG 24 hr tablet Take 1 tablet (500 mg total) by mouth daily. (Patient taking differently: Take 2,000 mg by mouth daily. Take 4 po in the evening) 30 tablet 5   fluticasone (FLONASE) 50 MCG/ACT nasal spray Place 1 spray into both nostrils daily. 16 g 6   levonorgestrel-ethinyl estradiol (ALESSE) 0.1-20 MG-MCG tablet Sronyx 0.1 mg-20 mcg tablet  TAKE 1 TABLET BY MOUTH EVERY DAY     metFORMIN (GLUCOPHAGE) 1000 MG tablet Take 1,000 mg by mouth 2 (two) times daily with a meal.     oxyCODONE (ROXICODONE) 5 MG immediate release tablet Take 1 tablet (5 mg total) by mouth every 6 (six) hours as needed for severe pain. 10 tablet 0   Vitamin D, Ergocalciferol, (DRISDOL) 1.25 MG (50000 UNIT) CAPS capsule Take 1 capsule (50,000 Units total) by mouth every 7 (seven) days. 12 capsule 0   No facility-administered medications prior to visit.    ROS: Review of Systems  Constitutional:  Positive for fatigue. Negative for activity change, appetite change, chills and unexpected weight change.  HENT:  Negative for congestion, mouth sores and sinus pressure.   Eyes:  Negative for visual disturbance.  Respiratory:  Negative for cough and chest  tightness.   Gastrointestinal:  Negative for abdominal pain and nausea.  Genitourinary:  Negative for difficulty urinating, frequency and vaginal pain.  Musculoskeletal:  Positive for arthralgias and gait problem. Negative for back pain.  Skin:  Negative for pallor and rash.  Neurological:  Negative for dizziness, tremors, weakness, numbness and headaches.  Psychiatric/Behavioral:  Negative for confusion, decreased concentration, sleep disturbance and suicidal ideas.     Objective:  BP 122/78 (BP Location: Left Arm, Patient Position: Sitting, Cuff Size: Normal)   Pulse 95   Temp 98.3 F (36.8 C) (Oral)   Ht 5\' 3"  (1.6 m)   Wt 272 lb (123.4 kg)   SpO2 96%   BMI 48.18 kg/m   BP Readings from Last 3 Encounters:  12/15/22 122/78  12/01/22 134/85  10/27/22 138/86    Wt Readings from Last 3 Encounters:  12/15/22 272 lb (123.4 kg)  12/01/22 272 lb (123.4 kg)  10/27/22 260 lb (117.9 kg)    Physical Exam Constitutional:      General: She is not in acute distress.    Appearance: She is well-developed. She is obese.  HENT:     Head: Normocephalic.     Right Ear: External ear normal.     Left Ear: External ear normal.     Nose: Nose normal.  Eyes:     General:        Right  eye: No discharge.        Left eye: No discharge.     Conjunctiva/sclera: Conjunctivae normal.     Pupils: Pupils are equal, round, and reactive to light.  Neck:     Thyroid: No thyromegaly.     Vascular: No JVD.     Trachea: No tracheal deviation.  Cardiovascular:     Rate and Rhythm: Normal rate and regular rhythm.     Heart sounds: Normal heart sounds.  Pulmonary:     Effort: No respiratory distress.     Breath sounds: No stridor. No wheezing.  Abdominal:     General: Bowel sounds are normal. There is no distension.     Palpations: Abdomen is soft. There is no mass.     Tenderness: There is no abdominal tenderness. There is no guarding or rebound.  Musculoskeletal:        General: No tenderness.      Cervical back: Normal range of motion and neck supple. No rigidity.  Lymphadenopathy:     Cervical: No cervical adenopathy.  Skin:    Findings: No erythema or rash.  Neurological:     Mental Status: She is oriented to person, place, and time.     Cranial Nerves: No cranial nerve deficit.     Motor: No abnormal muscle tone.     Coordination: Coordination abnormal.     Gait: Gait abnormal.     Deep Tendon Reflexes: Reflexes normal.  Psychiatric:        Behavior: Behavior normal.        Thought Content: Thought content normal.        Judgment: Judgment normal.   L knee w/pain  Lab Results  Component Value Date   WBC 7.1 12/04/2022   HGB 11.9 (L) 12/04/2022   HCT 35.7 (L) 12/04/2022   PLT 238 12/04/2022   GLUCOSE 82 10/07/2022   CHOL 168 06/17/2022   TRIG 84 06/17/2022   HDL 54 06/17/2022   LDLCALC 98 06/17/2022   ALT 9 06/17/2022   AST 16 06/17/2022   NA 137 10/07/2022   K 4.1 10/07/2022   CL 103 10/07/2022   CREATININE 0.91 10/07/2022   BUN 13 10/07/2022   CO2 24 10/07/2022   TSH 1.330 06/17/2022   HGBA1C 5.6 06/17/2022    No results found.  Assessment & Plan:   Problem List Items Addressed This Visit     Pre-diabetes    Check A1c Labs are pending  w/Healthy wt management      Schizoaffective disorder (HCC)    Cont w/Clozaril, Depakote ER       Vitamin D deficiency    On Vit D      Low serum vitamin B12 - Primary    On B complex      Bilateral chronic knee pain     Goal wt 225 lbs to qualify for surgery L knee pain/OA      Fibroadenoma of left breast    S/p bx 7.2024 - benign         No orders of the defined types were placed in this encounter.     Follow-up: Return in about 6 months (around 06/15/2023) for a follow-up visit.  Sonda Primes, MD

## 2022-12-15 NOTE — Assessment & Plan Note (Signed)
S/p bx 7.2024 - benign

## 2022-12-15 NOTE — Assessment & Plan Note (Addendum)
Check A1c Labs are pending  w/Healthy wt management

## 2022-12-17 DIAGNOSIS — F319 Bipolar disorder, unspecified: Secondary | ICD-10-CM | POA: Diagnosis not present

## 2022-12-28 ENCOUNTER — Other Ambulatory Visit (INDEPENDENT_AMBULATORY_CARE_PROVIDER_SITE_OTHER): Payer: Self-pay | Admitting: Physician Assistant

## 2022-12-28 DIAGNOSIS — E559 Vitamin D deficiency, unspecified: Secondary | ICD-10-CM

## 2022-12-29 ENCOUNTER — Encounter (INDEPENDENT_AMBULATORY_CARE_PROVIDER_SITE_OTHER): Payer: Self-pay | Admitting: Physician Assistant

## 2022-12-29 ENCOUNTER — Ambulatory Visit (INDEPENDENT_AMBULATORY_CARE_PROVIDER_SITE_OTHER): Payer: Medicare HMO | Admitting: Physician Assistant

## 2022-12-29 VITALS — BP 124/85 | HR 91 | Temp 98.1°F | Ht 63.0 in | Wt 269.0 lb

## 2022-12-29 DIAGNOSIS — R5383 Other fatigue: Secondary | ICD-10-CM

## 2022-12-29 DIAGNOSIS — Z6841 Body Mass Index (BMI) 40.0 and over, adult: Secondary | ICD-10-CM

## 2022-12-29 DIAGNOSIS — R7303 Prediabetes: Secondary | ICD-10-CM | POA: Diagnosis not present

## 2022-12-29 DIAGNOSIS — E538 Deficiency of other specified B group vitamins: Secondary | ICD-10-CM

## 2022-12-29 DIAGNOSIS — E559 Vitamin D deficiency, unspecified: Secondary | ICD-10-CM | POA: Diagnosis not present

## 2022-12-29 DIAGNOSIS — F258 Other schizoaffective disorders: Secondary | ICD-10-CM

## 2022-12-29 MED ORDER — VITAMIN D (ERGOCALCIFEROL) 1.25 MG (50000 UNIT) PO CAPS
50000.0000 [IU] | ORAL_CAPSULE | ORAL | 0 refills | Status: DC
Start: 2022-12-29 — End: 2023-01-27

## 2022-12-29 NOTE — Progress Notes (Signed)
.smr  Office: [phone]  /  Fax: [phone]  WEIGHT SUMMARY AND BIOMETRICS  Vitals Temp: 98.1 F (36.7 C) BP: 124/85 Pulse Rate: 91 SpO2: 97 %   Anthropometric Measurements Height: 5\' 3"  (1.6 m) Weight: 269 lb (122 kg) BMI (Calculated): 47.66 Weight at Last Visit: 272 lb Weight Lost Since Last Visit: 3 lb Weight Gained Since Last Visit: 0 Starting Weight: 287 lb Total Weight Loss (lbs): 18 lb (8.165 kg) Peak Weight: 303 lb   Body Composition  Body Fat %: 57 % Fat Mass (lbs): 153.8 lbs Muscle Mass (lbs): 110.2 lbs Visceral Fat Rating : 19   Other Clinical Data Fasting: yes Labs: yes Today's Visit #: 41 Starting Date: 03/26/20     HPI  Chief Complaint: OBESITY  Brenda Hernandez is here to discuss her progress with her obesity treatment plan. She is on the the Category 2 Plan and states she is following her eating plan approximately 75 % of the time. She states she is exercising 0 minutes 0 times per week.   Interval History:  Since last office visit she is down 3 lbs.   The patient, a 47 year old with prediabetes, vitamin D deficiency, and schizoaffective disorder, presents for a follow-up visit regarding her obesity treatment plan.  She reports a recent weight loss, despite experiencing leg pain that has limited her ability to exercise.  She attributes her weight loss to dietary changes, including increased consumption of vegetables and portion-controlled fruit and cottage cheese snacks.  She also reports walking more, despite the pain. She has been taking her medications as prescribed, including metformin for prediabetes and various psychiatric medications for schizoaffective disorder. She also takes vitamin B12 and vitamin D supplements.   Fasting labs obtained today. She was informed we would discuss her lab results at her next visit unless there is a critical issue that needs to be addressed sooner. She agreed to keep her next visit at the agreed upon time to  discuss these results.   Needs BMI < 40 to have knee replacement surgery   Pharmacotherapy: metformin 1000 mg twice daily. Prescribed by psychiatry. No GI or other side effects with metformin.   TREATMENT PLAN FOR OBESITY:  Recommended Dietary Goals  Brenda Hernandez is currently in the action stage of change. As such, her goal is to continue weight management plan. She has agreed to the Category 2 Plan.  Behavioral Intervention  We discussed the following Behavioral Modification Strategies today: continue to work on maintaining a reduced calorie state, getting the recommended amount of protein, incorporating whole foods, making healthy choices, staying well hydrated and practicing mindfulness when eating..  Additional resources provided today: NA  Recommended Physical Activity Goals  Brenda Hernandez has been advised to work up to 150 minutes of moderate intensity aerobic activity a week and strengthening exercises 2-3 times per week for cardiovascular health, weight loss maintenance and preservation of muscle mass.   She has agreed to Think about enjoyable ways to increase daily physical activity and overcoming barriers to exercise and Increase physical activity in their day and reduce sedentary time (increase NEAT).   Pharmacotherapy We discussed various medication options to help Brenda Hernandez with her weight loss efforts and we both agreed to continue metformin for prediabetes.    Return in about 4 weeks (around 01/26/2023).Marland Kitchen She was informed of the importance of frequent follow up visits to maximize her success with intensive lifestyle modifications for her multiple health conditions.  PHYSICAL EXAM:  Blood pressure 124/85, pulse 91, temperature 98.1 F (36.7  C), height 5\' 3"  (1.6 m), weight 269 lb (122 kg), SpO2 97%. Body mass index is 47.65 kg/m.  General: She is overweight, cooperative, alert, well developed, and in no acute distress. PSYCH: Has normal mood, affect and thought process.    Cardiovascular: HR 90's BP 124/85 Lungs: Normal breathing effort, no conversational dyspnea.  DIAGNOSTIC DATA REVIEWED:  BMET    Component Value Date/Time   NA 137 10/07/2022 1100   NA 139 06/17/2022 1436   K 4.1 10/07/2022 1100   CL 103 10/07/2022 1100   CO2 24 10/07/2022 1100   GLUCOSE 82 10/07/2022 1100   BUN 13 10/07/2022 1100   BUN 9 06/17/2022 1436   CREATININE 0.91 10/07/2022 1100   CALCIUM 8.2 (L) 10/07/2022 1100   GFRNONAA >60 10/07/2022 1100   GFRAA 98 03/26/2020 1012   Lab Results  Component Value Date   HGBA1C 5.6 06/17/2022   HGBA1C 5.7 (H) 09/01/2014   Lab Results  Component Value Date   INSULIN 9.9 06/17/2022   INSULIN 24.7 03/26/2020   Lab Results  Component Value Date   TSH 1.330 06/17/2022   CBC    Component Value Date/Time   WBC 7.1 12/04/2022 1640   RBC 3.86 (L) 12/04/2022 1640   HGB 11.9 (L) 12/04/2022 1640   HGB 12.6 03/26/2020 1012   HCT 35.7 (L) 12/04/2022 1640   HCT 38.2 03/26/2020 1012   PLT 238 12/04/2022 1640   PLT 298 03/26/2020 1012   MCV 92.5 12/04/2022 1640   MCV 88 03/26/2020 1012   MCH 30.8 12/04/2022 1640   MCHC 33.3 12/04/2022 1640   RDW 16.5 (H) 12/04/2022 1640   RDW 15.8 (H) 03/26/2020 1012   Iron Studies No results found for: "IRON", "TIBC", "FERRITIN", "IRONPCTSAT" Lipid Panel     Component Value Date/Time   CHOL 168 06/17/2022 1436   TRIG 84 06/17/2022 1436   HDL 54 06/17/2022 1436   CHOLHDL 2.6 11/01/2021 1120   VLDL 19 11/01/2021 1120   LDLCALC 98 06/17/2022 1436   Hepatic Function Panel     Component Value Date/Time   PROT 6.1 06/17/2022 1436   ALBUMIN 3.5 (L) 06/17/2022 1436   AST 16 06/17/2022 1436   ALT 9 06/17/2022 1436   ALKPHOS 70 06/17/2022 1436   BILITOT 0.2 06/17/2022 1436      Component Value Date/Time   TSH 1.330 06/17/2022 1436   Nutritional Lab Results  Component Value Date   VD25OH 31.9 06/17/2022   VD25OH 52.44 12/28/2020   VD25OH 46.8 12/05/2020    ASSOCIATED CONDITIONS  ADDRESSED TODAY  ASSESSMENT AND PLAN  Problem List Items Addressed This Visit     Pre-diabetes - Primary   Relevant Orders   CMP14+EGFR   Hemoglobin A1c   Insulin, random   Lipid Panel With LDL/HDL Ratio   Vitamin D deficiency   Relevant Medications   Vitamin D, Ergocalciferol, (DRISDOL) 1.25 MG (50000 UNIT) CAPS capsule   Other Relevant Orders   VITAMIN D 25 Hydroxy (Vit-D Deficiency, Fractures)   Low serum vitamin B12   Relevant Orders   Vitamin B12   CBC with Differential/Platelet   Morbid obesity (HCC)   Fatigue   Relevant Orders   TSH  Obesity Weight loss progress noted, with a decrease from 272 lbs to 269 lbs. Patient has incorporated low-calorie vegetables and portion-controlled fruit and cottage cheese into her diet. Despite leg pain limiting exercise, patient has been engaging in walking and stair climbing. -Continue current dietary changes and physical activity  as tolerated. -Next follow-up in 4 weeks on January 27, 2023 at 2:15pm.  Prediabetes Patient is on Metformin 1000mg  twice daily. No new symptoms reported. -Continue Metformin 1000mg  twice daily prescribed by psychiatry. -Order labs including A1C and insulin level., and lipids, electrolytes and liver and renal function.  Continue working on nutrition plan to decrease simple carbohydrates, increase lean proteins and exercise to promote weight loss, improve glycemic control and prevent progression to Type 2 diabetes.   Vitamin D Deficiency Vitamin D is at goal of 50.  Most recent vitamin D level was 31.9. She is on  prescription ergocalciferol 50,000 IU weekly. Lab Results  Component Value Date   VD25OH 70.31 01/02/2023   VD25OH 31.9 06/17/2022   VD25OH 52.44 12/28/2020    Plan: Continue and refill  prescription ergocalciferol 50,000 IU weekly Low vitamin D levels can be associated with adiposity and may result in leptin resistance and weight gain. Also associated with fatigue. Currently on vitamin D  supplementation without any adverse effects.  Recheck vitamin D level today to optimize supplementation.   Schizoaffective disorder Patient is on Depakote 500mg  four times daily at night, Clozaril 5 tablets daily, and Clomipramine 75mg  twice daily. No new psychiatric symptoms reported. -Update medication list to reflect current dosing of Depakote. -Continue current psychiatric medications per psychiatry. She sees counselor weekly.  Fatigue Patient reports unclear energy levels. -Order labs including CBC, chemistry, TSH, and B12 level.  General Health Maintenance -Order lipid panel. -Advise patient on strategies to avoid overindulgence in treats during upcoming Halloween event.  ATTESTASTION STATEMENTS:  Reviewed by clinician on day of visit: allergies, medications, problem list, medical history, surgical history, family history, social history, and previous encounter notes.   I have personally spent 30 minutes total time today in preparation, patient care, nutritional counseling and documentation for this visit, including the following: review of clinical lab tests; review of medical tests/procedures/services.      Ruhama Lehew, PA-C

## 2022-12-30 ENCOUNTER — Ambulatory Visit: Payer: Medicare HMO

## 2022-12-30 VITALS — Ht 63.0 in | Wt 272.0 lb

## 2022-12-30 DIAGNOSIS — Z0000 Encounter for general adult medical examination without abnormal findings: Secondary | ICD-10-CM | POA: Diagnosis not present

## 2022-12-30 NOTE — Progress Notes (Cosign Needed Addendum)
Subjective:   Brenda Hernandez is a 47 y.o. female who presents for Medicare Annual (Subsequent) preventive examination.  Visit Complete: Virtual I connected with  Katharine Look on 12/30/22 by a audio enabled telemedicine application and verified that I am speaking with the correct person using two identifiers.  Patient Location: Home  Provider Location: Office/Clinic  I discussed the limitations of evaluation and management by telemedicine. The patient expressed understanding and agreed to proceed.  Vital Signs: Because this visit was a virtual/telehealth visit, some criteria may be missing or patient reported. Any vitals not documented were not able to be obtained and vitals that have been documented are patient reported.  Cardiac Risk Factors include: advanced age (>59men, >66 women);sedentary lifestyle;obesity (BMI >30kg/m2);family history of premature cardiovascular disease     Objective:    Today's Vitals   12/30/22 1403  Weight: 272 lb (123.4 kg)  Height: 5\' 3"  (1.6 m)  PainSc: 0-No pain   Body mass index is 48.18 kg/m.     12/30/2022    2:07 PM 10/09/2022    7:10 AM 09/28/2022   11:57 AM 10/31/2021    2:05 PM 10/09/2020    5:10 PM 05/02/2020    5:11 PM 10/12/2018    2:26 PM  Advanced Directives  Does Patient Have a Medical Advance Directive? No No No No No No No  Would patient like information on creating a medical advance directive? No - Patient declined No - Patient declined No - Patient declined No - Patient declined Yes (MAU/Ambulatory/Procedural Areas - Information given) No - Patient declined     Current Medications (verified) Outpatient Encounter Medications as of 12/30/2022  Medication Sig   aspirin EC 81 MG tablet Take 81 mg by mouth daily. Swallow whole.   B Complex-Folic Acid (B COMPLEX PLUS) TABS Take 1 tablet by mouth daily. (Patient not taking: Reported on 12/30/2022)   clomiPRAMINE (ANAFRANIL) 75 MG capsule Take 75 mg by mouth 2 (two) times daily.    cloZAPine (CLOZARIL) 100 MG tablet Take 5 tablets (500 mg total) by mouth daily.   cyanocobalamin (VITAMIN B12) 500 MCG tablet Take 1 tablet (500 mcg total) by mouth daily.   divalproex (DEPAKOTE ER) 500 MG 24 hr tablet Take 1 tablet (500 mg total) by mouth daily. (Patient taking differently: Take 2,000 mg by mouth daily. Take 4 po in the evening)   fluticasone (FLONASE) 50 MCG/ACT nasal spray Place 1 spray into both nostrils daily.   levonorgestrel-ethinyl estradiol (ALESSE) 0.1-20 MG-MCG tablet Sronyx 0.1 mg-20 mcg tablet  TAKE 1 TABLET BY MOUTH EVERY DAY   metFORMIN (GLUCOPHAGE) 1000 MG tablet Take 1,000 mg by mouth 2 (two) times daily with a meal.   oxyCODONE (ROXICODONE) 5 MG immediate release tablet Take 1 tablet (5 mg total) by mouth every 6 (six) hours as needed for severe pain.   Vitamin D, Ergocalciferol, (DRISDOL) 1.25 MG (50000 UNIT) CAPS capsule Take 1 capsule (50,000 Units total) by mouth every 7 (seven) days.   No facility-administered encounter medications on file as of 12/30/2022.    Allergies (verified) Citric acid and Diphenhydramine   History: Past Medical History:  Diagnosis Date   Abnormal pap    Bipolar 1 disorder (HCC)    GERD (gastroesophageal reflux disease)    Pre-diabetes    Schizophrenia (HCC)    Swallowing difficulty    Past Surgical History:  Procedure Laterality Date   BREAST BIOPSY Right 07/16/2022   [place] RT BREAST BX W LOC DEV 1ST LESION IMG  BX SPEC US GUIDE 07/16/2022 [facility]   BREAST BIOPSY  10/08/2022   [place] RT RADIOACTIVE SEED LOC 10/08/2022 [facility]   BREAST LUMPECTOMY WITH RADIOACTIVE SEED LOCALIZATION Right 10/09/2022   Procedure: RIGHT BREAST LUMPECTOMY WITH RADIOACTIVE SEED LOCALIZATION;  Surgeon: Griselda Miner, MD;  Location: [facility];  Service: General;  Laterality: Right;   Family History  Problem Relation Age of Onset   Diabetes Maternal Grandmother    Diabetes Maternal Grandfather    Diabetes Paternal  Grandmother    Diabetes Paternal Grandfather    Breast cancer Maternal Aunt    Breast cancer Paternal Aunt    Diabetes Father    High blood pressure Father    High Cholesterol Father    Social History   Socioeconomic History   Marital status: Divorced    Spouse name: Not on file   Number of children: Not on file   Years of education: Not on file   Highest education level: Bachelor's degree (e.g., BA, AB, BS)  Occupational History   Occupation: disability  Tobacco Use   Smoking status: Never   Smokeless tobacco: Never  Vaping Use   Vaping status: Never Used  Substance and Sexual Activity   Alcohol use: No   Drug use: No   Sexual activity: Not Currently    Birth control/protection: Abstinence, Pill  Other Topics Concern   Not on file  Social History Narrative   Not on file   Social Determinants of Health   Financial Resource Strain: Low Risk  (12/30/2022)   Overall Financial Resource Strain (CARDIA)    Difficulty of Paying Living Expenses: Not hard at all  Food Insecurity: No Food Insecurity (12/30/2022)   Hunger Vital Sign    Worried About Running Out of Food in the Last Year: Never true    Ran Out of Food in the Last Year: Never true  Transportation Needs: No Transportation Needs (12/30/2022)   PRAPARE - Administrator, Civil Service (Medical): No    Lack of Transportation (Non-Medical): No  Physical Activity: Inactive (12/30/2022)   Exercise Vital Sign    Days of Exercise per Week: 0 days    Minutes of Exercise per Session: 0 min  Stress: No Stress Concern Present (12/30/2022)   [employer] of Occupational Health - Occupational Stress Questionnaire    Feeling of Stress : Not at all  Social Connections: Moderately Integrated (12/30/2022)   Social Connection and Isolation Panel [NHANES]    Frequency of Communication with Friends and Family: More than three times a week    Frequency of Social Gatherings with Friends and Family: Three times a week     Attends Religious Services: More than 4 times per year    Active Member of Clubs or Organizations: Yes    Attends Banker Meetings: 1 to 4 times per year    Marital Status: Divorced    Tobacco Counseling Counseling given: Not Answered   Clinical Intake:  Pre-visit preparation completed: Yes  Pain : No/denies pain Pain Score: 0-No pain     BMI - recorded: 48.18 Nutritional Status: BMI > 30  Obese Nutritional Risks: None Diabetes: Yes CBG done?: No Did pt. bring in CBG monitor from home?: No  How often do you need to have someone help you when you read instructions, pamphlets, or other written materials from your doctor or pharmacy?: 1 - Never What is the last grade level you completed in school?: B.A. DEGREE FROM [employer]  Interpreter Needed?: No  Information entered by :: Demontre Padin N. Trenace Coughlin, LPN.   Activities of Daily Living    12/30/2022    2:10 PM 10/09/2022    7:12 AM  In your present state of health, do you have any difficulty performing the following activities:  Hearing? 0 0  Vision? 0 0  Difficulty concentrating or making decisions? 0 0  Walking or climbing stairs? 0 0  Dressing or bathing? 0 0  Doing errands, shopping? 0   Preparing Food and eating ? N   Using the Toilet? N   In the past six months, have you accidently leaked urine? N   Do you have problems with loss of bowel control? N   Managing your Medications? N   Managing your Finances? N   Housekeeping or managing your Housekeeping? N     Patient Care Team: Plotnikov, Georgina Quint, MD as PCP - General (Internal Medicine) Beverely Low, MD as Consulting Physician (Orthopedic Surgery) Wilder Glade, MD Irene Limbo, FNP (Nurse Practitioner) Illene Labrador, OD as Consulting Physician (Optometry) Ob/Gyn, [facility] as Consulting Physician (Obstetrics and Gynecology) Levan Hurst, Counselor as Counselor (Psychiatry) Griselda Miner, MD as Consulting  Physician (General Surgery)  Indicate any recent Medical Services you may have received from other than [facility] providers in the past year (date may be approximate).     Assessment:   This is a routine wellness examination for Janet.  Hearing/Vision screen Hearing Screening - Comments:: Patient denied any hearing difficulty.   No hearing aids.  Vision Screening - Comments:: Patient does wear corrective lenses/contacts.  Annual eye exam done by: Illene Labrador, OD.    Goals Addressed   None   Depression Screen    12/30/2022    2:16 PM 12/15/2022    2:02 PM 02/12/2022    1:18 PM 10/31/2021    2:19 PM 03/26/2020    8:02 AM 10/12/2018    2:27 PM 07/02/2015    6:01 PM  PHQ 2/9 Scores  PHQ - 2 Score 0 0 0 0 4 0 0  PHQ- 9 Score 0    10 0     Fall Risk    12/30/2022    2:08 PM 12/15/2022    2:02 PM 02/12/2022    1:17 PM 10/31/2021    2:07 PM 09/23/2020    2:41 PM  Fall Risk   Falls in the past year? 1 0 0 0 1  Number falls in past yr: 0 0 0 0 0  Injury with Fall? 0 0 0 0 1  Risk for fall due to :  No Fall Risks No Fall Risks No Fall Risks Impaired balance/gait;Impaired mobility;Orthopedic patient  Follow up Falls evaluation completed;Education provided Falls evaluation completed Falls evaluation completed Falls evaluation completed     MEDICARE RISK AT HOME: Medicare Risk at Home Any stairs in or around the home?: No If so, are there any without handrails?: No Home free of loose throw rugs in walkways, pet beds, electrical cords, etc?: Yes Adequate lighting in your home to reduce risk of falls?: Yes Life alert?: No Use of a cane, walker or w/c?: Yes Grab bars in the bathroom?: Yes Shower chair or bench in shower?: Yes Elevated toilet seat or a handicapped toilet?: Yes  TIMED UP AND GO:  Was the test performed?  No    Cognitive Function:    12/30/2022    2:15 PM  MMSE - Mini Mental State Exam  Not completed: Unable  to complete        12/30/2022    2:10 PM  10/31/2021    2:22 PM 10/12/2018    2:29 PM  6CIT Screen  What Year? 0 points 0 points 0 points  What month? 0 points 0 points 0 points  What time? 0 points 0 points 0 points  Count back from 20 0 points 0 points 0 points  Months in reverse 0 points 0 points 0 points  Repeat phrase 0 points 0 points 0 points  Total Score 0 points 0 points 0 points    Immunizations Immunization History  Administered Date(s) Administered   HPV 9-valent 03/11/2020, 05/29/2020, 08/16/2020   Influenza Inj Mdck Quad Pf 01/02/2017   Influenza,inj,Quad PF,6+ Mos 02/13/2018, 12/24/2018, 02/19/2020, 12/24/2020, 01/29/2022   PFIZER(Purple Top)SARS-COV-2 Vaccination 05/27/2019, 06/17/2019   PPD Test 05/20/2017   Tdap 05/14/2017    TDAP status: Up to date  Flu Vaccine status: Due, Education has been provided regarding the importance of this vaccine. Advised may receive this vaccine at local pharmacy or Health Dept. Aware to provide a copy of the vaccination record if obtained from local pharmacy or Health Dept. Verbalized acceptance and understanding.  Pneumococcal vaccine status: Declined,  Education has been provided regarding the importance of this vaccine but patient still declined. Advised may receive this vaccine at local pharmacy or Health Dept. Aware to provide a copy of the vaccination record if obtained from local pharmacy or Health Dept. Verbalized acceptance and understanding.   Covid-19 vaccine status: Completed vaccines  Qualifies for Shingles Vaccine? No   Zostavax completed No   Shingrix Completed?: No.    Education has been provided regarding the importance of this vaccine. Patient has been advised to call insurance company to determine out of pocket expense if they have not yet received this vaccine. Advised may also receive vaccine at local pharmacy or Health Dept. Verbalized acceptance and understanding.  Screening Tests Health Maintenance  Topic Date Due   FOOT EXAM  Never done   HIV  Screening  Never done   Diabetic kidney evaluation - Urine ACR  Never done   Hepatitis C Screening  Never done   COVID-19 Vaccine (3 - Pfizer risk series) 07/15/2019   MAMMOGRAM  01/10/2020   HPV VACCINES (3 - Risk 3-dose SCDM series) 12/16/2020   OPHTHALMOLOGY EXAM  08/26/2022   Colonoscopy  Never done   HEMOGLOBIN A1C  12/17/2022   INFLUENZA VACCINE  06/14/2023 (Originally 10/15/2022)   Diabetic kidney evaluation - eGFR measurement  10/07/2023   Medicare Annual Wellness (AWV)  12/30/2023   Cervical Cancer Screening (HPV/Pap Cotest)  02/13/2024   DTaP/Tdap/Td (2 - Td or Tdap) 05/15/2027    Health Maintenance  Health Maintenance Due  Topic Date Due   FOOT EXAM  Never done   HIV Screening  Never done   Diabetic kidney evaluation - Urine ACR  Never done   Hepatitis C Screening  Never done   COVID-19 Vaccine (3 - Pfizer risk series) 07/15/2019   MAMMOGRAM  01/10/2020   HPV VACCINES (3 - Risk 3-dose SCDM series) 12/16/2020   OPHTHALMOLOGY EXAM  08/26/2022   Colonoscopy  Never done   HEMOGLOBIN A1C  12/17/2022    Colorectal cancer screening: Patient will speak with PCP regarding Cologuard at next visit.  Mammogram status: Completed 06/23/2022. Repeat every year  Bone Density scan: Never done  Lung Cancer Screening: (Low Dose CT Chest recommended if Age 75-80 years, 20 pack-year currently smoking OR have quit w/in  15years.) does not qualify.   Lung Cancer Screening Referral: no  Additional Screening:  Hepatitis C Screening: does qualify; Completed: no  Vision Screening: Recommended annual ophthalmology exams for early detection of glaucoma and other disorders of the eye. Is the patient up to date with their annual eye exam?  Yes  Who is the provider or what is the name of the office in which the patient attends annual eye exams? Illene Labrador, OD. If pt is not established with a provider, would they like to be referred to a provider to establish care? No .   Dental Screening:  Recommended annual dental exams for proper oral hygiene  Diabetic Foot Exam: N/A  Community Resource Referral / Chronic Care Management: CRR required this visit?  No   CCM required this visit?  No     Plan:     I have personally reviewed and noted the following in the patient's chart:   Medical and social history Use of alcohol, tobacco or illicit drugs  Current medications and supplements including opioid prescriptions. Patient is currently taking opioid prescriptions. Information provided to patient regarding non-opioid alternatives. Patient advised to discuss non-opioid treatment plan with their provider. Functional ability and status Nutritional status Physical activity Advanced directives List of other physicians Hospitalizations, surgeries, and ER visits in previous 12 months Vitals Screenings to include cognitive, depression, and falls Referrals and appointments  In addition, I have reviewed and discussed with patient certain preventive protocols, quality metrics, and best practice recommendations. A written personalized care plan for preventive services as well as general preventive health recommendations were provided to patient.     Mickeal Needy, LPN   [id number]   After Visit Summary: (MyChart) Due to this being a telephonic visit, the after visit summary with patients personalized plan was offered to patient via MyChart   Nurse Notes: N/A    Medical screening examination/treatment/procedure(s) were performed by non-physician practitioner and as supervising physician I was immediately available for consultation/collaboration.  I agree with above. Jacinta Shoe, MD

## 2022-12-30 NOTE — Patient Instructions (Signed)
Ms. Brenda Hernandez , Thank you for taking time to come for your Medicare Wellness Visit. I appreciate your ongoing commitment to your health goals. Please review the following plan we discussed and let me know if I can assist you in the future.   Referrals/Orders/Follow-Ups/Clinician Recommendations: No  This is a list of the screening recommended for you and due dates:  Health Maintenance  Topic Date Due   Complete foot exam   Never done   HIV Screening  Never done   Yearly kidney health urinalysis for diabetes  Never done   Hepatitis C Screening  Never done   COVID-19 Vaccine (3 - Pfizer risk series) 07/15/2019   Mammogram  01/10/2020   HPV Vaccine (3 - Risk 3-dose SCDM series) 12/16/2020   Eye exam for diabetics  08/26/2022   Colon Cancer Screening  Never done   Hemoglobin A1C  12/17/2022   Flu Shot  06/14/2023*   Yearly kidney function blood test for diabetes  10/07/2023   Medicare Annual Wellness Visit  12/30/2023   Pap with HPV screening  02/13/2024   DTaP/Tdap/Td vaccine (2 - Td or Tdap) 05/15/2027  *Topic was postponed. The date shown is not the original due date.    Advanced directives: (Declined) Advance directive discussed with you today. Even though you declined this today, please call our office should you change your mind, and we can give you the proper paperwork for you to fill out.  Next Medicare Annual Wellness Visit scheduled for next year: Yes

## 2022-12-31 DIAGNOSIS — F319 Bipolar disorder, unspecified: Secondary | ICD-10-CM | POA: Diagnosis not present

## 2023-01-02 ENCOUNTER — Other Ambulatory Visit (HOSPITAL_COMMUNITY)
Admission: AD | Admit: 2023-01-02 | Discharge: 2023-01-02 | Disposition: A | Discharge status: Home / Self Care | Payer: Medicare HMO | Attending: Physician Assistant | Admitting: Physician Assistant

## 2023-01-02 DIAGNOSIS — E559 Vitamin D deficiency, unspecified: Secondary | ICD-10-CM | POA: Insufficient documentation

## 2023-01-02 DIAGNOSIS — R5383 Other fatigue: Secondary | ICD-10-CM | POA: Diagnosis not present

## 2023-01-02 DIAGNOSIS — R7303 Prediabetes: Secondary | ICD-10-CM | POA: Diagnosis not present

## 2023-01-02 DIAGNOSIS — E538 Deficiency of other specified B group vitamins: Secondary | ICD-10-CM | POA: Diagnosis not present

## 2023-01-02 LAB — CBC WITH DIFFERENTIAL/PLATELET
Abs Immature Granulocytes: 0.03 10*3/uL (ref 0.00–0.07)
Basophils Absolute: 0 10*3/uL (ref 0.0–0.1)
Basophils Relative: 0 %
Eosinophils Absolute: 0.1 10*3/uL (ref 0.0–0.5)
Eosinophils Relative: 2 %
HCT: 39.4 % (ref 36.0–46.0)
Hemoglobin: 13.2 g/dL (ref 12.0–15.0)
Immature Granulocytes: 0 %
Lymphocytes Relative: 48 %
Lymphs Abs: 3.8 10*3/uL (ref 0.7–4.0)
MCH: 30.2 pg (ref 26.0–34.0)
MCHC: 33.5 g/dL (ref 30.0–36.0)
MCV: 90.2 fL (ref 80.0–100.0)
Monocytes Absolute: 0.3 10*3/uL (ref 0.1–1.0)
Monocytes Relative: 4 %
Neutro Abs: 3.6 10*3/uL (ref 1.7–7.7)
Neutrophils Relative %: 46 %
Platelets: 257 10*3/uL (ref 150–400)
RBC: 4.37 MIL/uL (ref 3.87–5.11)
RDW: 15.7 % — ABNORMAL HIGH (ref 11.5–15.5)
WBC: 7.9 10*3/uL (ref 4.0–10.5)
nRBC: 0 % (ref 0.0–0.2)

## 2023-01-02 LAB — LIPID PANEL
Cholesterol: 176 mg/dL (ref 0–200)
HDL: 59 mg/dL (ref >40)
LDL Cholesterol: 92 mg/dL (ref 0–99)
Total CHOL/HDL Ratio: 3 {ratio}
Triglycerides: 125 mg/dL (ref <150)
VLDL: 25 mg/dL (ref 0–40)

## 2023-01-02 LAB — HEMOGLOBIN A1C
Hgb A1c MFr Bld: 5.4 % (ref 4.8–5.6)
Mean Plasma Glucose: 108.28 mg/dL

## 2023-01-02 LAB — COMPREHENSIVE METABOLIC PANEL
ALT: 11 U/L (ref 0–44)
AST: 15 U/L (ref 15–41)
Albumin: 3.2 g/dL — ABNORMAL LOW (ref 3.5–5.0)
Alkaline Phosphatase: 65 U/L (ref 38–126)
Anion gap: 12 (ref 5–15)
BUN: 12 mg/dL (ref 6–20)
CO2: 23 mmol/L (ref 22–32)
Calcium: 8.3 mg/dL — ABNORMAL LOW (ref 8.9–10.3)
Chloride: 105 mmol/L (ref 98–111)
Creatinine, Ser: 1.01 mg/dL — ABNORMAL HIGH (ref 0.44–1.00)
GFR, Estimated: >60 mL/min (ref >60)
Glucose, Bld: 92 mg/dL (ref 70–99)
Potassium: 3.6 mmol/L (ref 3.5–5.1)
Sodium: 140 mmol/L (ref 135–145)
Total Bilirubin: 0.4 mg/dL (ref 0.3–1.2)
Total Protein: 7.1 g/dL (ref 6.5–8.1)

## 2023-01-02 LAB — VITAMIN B12: Vitamin B-12: 323 pg/mL (ref 180–914)

## 2023-01-02 LAB — VITAMIN D 25 HYDROXY (VIT D DEFICIENCY, FRACTURES): Vit D, 25-Hydroxy: 70.31 ng/mL (ref 30–100)

## 2023-01-02 LAB — TSH: TSH: 2.256 u[IU]/mL (ref 0.350–4.500)

## 2023-01-05 LAB — INSULIN, RANDOM: Insulin: 9.8 u[IU]/mL (ref 2.6–24.9)

## 2023-01-21 DIAGNOSIS — F319 Bipolar disorder, unspecified: Secondary | ICD-10-CM | POA: Diagnosis not present

## 2023-01-26 DIAGNOSIS — F429 Obsessive-compulsive disorder, unspecified: Secondary | ICD-10-CM | POA: Diagnosis not present

## 2023-01-26 DIAGNOSIS — F319 Bipolar disorder, unspecified: Secondary | ICD-10-CM | POA: Diagnosis not present

## 2023-01-27 ENCOUNTER — Ambulatory Visit (INDEPENDENT_AMBULATORY_CARE_PROVIDER_SITE_OTHER): Payer: Medicare HMO | Admitting: Physician Assistant

## 2023-01-27 ENCOUNTER — Encounter (INDEPENDENT_AMBULATORY_CARE_PROVIDER_SITE_OTHER): Payer: Self-pay | Admitting: Physician Assistant

## 2023-01-27 VITALS — BP 120/85 | HR 104 | Temp 98.7°F | Ht 63.0 in | Wt 262.0 lb

## 2023-01-27 DIAGNOSIS — R7303 Prediabetes: Secondary | ICD-10-CM | POA: Diagnosis not present

## 2023-01-27 DIAGNOSIS — E559 Vitamin D deficiency, unspecified: Secondary | ICD-10-CM | POA: Diagnosis not present

## 2023-01-27 DIAGNOSIS — Z6841 Body Mass Index (BMI) 40.0 and over, adult: Secondary | ICD-10-CM | POA: Diagnosis not present

## 2023-01-27 DIAGNOSIS — F3160 Bipolar disorder, current episode mixed, unspecified: Secondary | ICD-10-CM

## 2023-01-27 MED ORDER — VITAMIN D (ERGOCALCIFEROL) 1.25 MG (50000 UNIT) PO CAPS: 50000.0000 [IU] | ORAL_CAPSULE | ORAL | 0 refills | Status: DC

## 2023-01-27 NOTE — Progress Notes (Signed)
SUBJECTIVE:  Chief Complaint: Obesity  Interim History: Brenda Hernandez, a 47 year old individual with a history of prediabetes, vitamin D deficiency, chronic bilateral knee pain, and bipolar affective disorder, presents for a follow-up visit regarding her obesity treatment plan. She has recently moved in with her mother and reports a weight loss of seven pounds since the last visit. She attributes this weight loss to dietary changes and increased physical activity all of which have improved following her move to live with her mother who has been supporting her efforts to eat better, keep a normal schedule and exercise more frequently. .  She has been making efforts to eat healthier, focusing on portion control and avoiding late-night eating. Her diet includes rotisserie chicken, chicken salad sandwiches, eggs, bacon, and cottage cheese with fruit cups. She has been mindful of her sugar intake, avoiding foods like stewed apples and yams due to their high sugar content.  In addition to dietary changes, she has increased her physical activity by walking up and down a flight of stairs in her two-story house daily. She reports no changes in her medications and is taking vitamin D supplements every seven days. She has not experienced any problems with her vitamin D supplementation.  Her recent lab results show an A1c of 5.4, an insulin level of 9.8, and normal thyroid function. Hemoglobin and hematocrit are now within the normal range. She is scheduled for a follow-up visit in a month.  Brenda Hernandez is here to discuss her progress with her obesity treatment plan. She is on the Category 2 Plan and states she is following her eating plan approximately 35 % of the time. She states she is exercising 10 minutes 7 times per week.  Needs BMI < 40 to have knee replacement surgery    Pharmacotherapy: metformin 1000 mg twice daily. Prescribed by psychiatry. No GI or other side effects with metformin.    OBJECTIVE: Visit Diagnoses: Problem List Items Addressed This Visit     Pre-diabetes - Primary   Vitamin D deficiency   Relevant Medications   Vitamin D, Ergocalciferol, (DRISDOL) 1.25 MG (50000 UNIT) CAPS capsule   Morbid obesity (HCC)   Obesity 47 year old with obesity, down seven pounds since the last visit. Making dietary changes and increasing physical activity, including daily stair walking. Receiving support from mother. Discussed portion control strategies for Thanksgiving, emphasizing protein intake and small portions of sides. - Continue current dietary modifications - Continue physical activity regimen - Implement portion control strategies during Thanksgiving - Follow up in one month  Prediabetes Prediabetes with recent A1c of 5.4, within goal range. Lifestyle changes positively impacting blood sugar levels. On metformin 1000 mg BID per psychiatry.  Continue working on nutrition plan to decrease simple carbohydrates, increase lean proteins and exercise to promote weight loss, improve glycemic control and prevent progression to Type 2 diabetes.  - Maintain current dietary and exercise regimen  Vitamin D Deficiency Vitamin D deficiency, currently taking vitamin D weekly. Recent labs show improved levels. Plan to change supplementation to every 14 days. - Change/refill Ergocalciferol 50,000 units to every 14 days - Prescribe a 90-day supply   Bipolar Disorder Bipolar disorder, managed by psychiatry. Regular therapy sessions. No changes in psychiatric medications discussed. - Continue current psychiatric management and therapy sessions  General Health Maintenance Positive lifestyle changes, including dietary modifications and increased physical activity. Ensuring adequate hydration and avoiding late-night eating. Discussed avoiding high-sugar foods and beverages. - Encourage continued healthy lifestyle choices - Monitor hydration status - Advise caution  with high-sugar  foods and beverages  Follow-up - Follow up on December 12th at 2:15 PM. Vitals Temp: 98.7 F (37.1 C) BP: 120/85 Pulse Rate: (!) 104 SpO2: 99 %   Anthropometric Measurements Height: 5\' 3"  (1.6 m) Weight: 262 lb (118.8 kg) BMI (Calculated): 46.42 Weight at Last Visit: 269 lb Weight Lost Since Last Visit: 7 lb Weight Gained Since Last Visit: 0 Starting Weight: 287 lb Total Weight Loss (lbs): 25 lb (11.3 kg) Peak Weight: 303 lb   Body Composition  Body Fat %: 55.8 % Fat Mass (lbs): 146.6 lbs Muscle Mass (lbs): 110.4 lbs Visceral Fat Rating : 18   Other Clinical Data Fasting: yes Labs: no Today's Visit #: 42 Starting Date: 03/26/20     ASSESSMENT AND PLAN:  Diet: Brenda Hernandez is currently in the action stage of change. As such, her goal is to continue with weight loss efforts. She has agreed to Category 2 Plan.  Exercise: Brenda Hernandez has been instructed to continue exercising as is for weight loss and overall health benefits.   Behavior Modification:  We discussed the following Behavioral Modification Strategies today: increasing lean protein intake, decreasing simple carbohydrates, increasing vegetables, increase H2O intake, decrease liquid calories, increase high fiber foods, no skipping meals, ways to avoid nighttime snacking, better snacking choices, holiday eating strategies, and planning for success. We discussed various medication options to help Brenda Hernandez with her weight loss efforts and we both agreed to continue metformin as prescribed for prediabetes and continue to work on nutritional and behavioral strategies to promote weight loss.  .  Return in about 4 weeks (around 02/24/2023).Marland Kitchen She was informed of the importance of frequent follow up visits to maximize her success with intensive lifestyle modifications for her multiple health conditions.  Attestation Statements:   Reviewed by clinician on day of visit: allergies, medications, problem list, medical history,  surgical history, family history, social history, and previous encounter notes.   Time spent on visit including pre-visit chart review and post-visit care and charting was 32 minutes.    Brenda Siegert, PA-C

## 2023-01-29 ENCOUNTER — Other Ambulatory Visit (HOSPITAL_COMMUNITY)
Admission: AD | Admit: 2023-01-29 | Discharge: 2023-01-29 | Disposition: A | Discharge status: Home / Self Care | Payer: Medicare HMO | Attending: Psychology | Admitting: Psychology

## 2023-01-29 DIAGNOSIS — Z79899 Other long term (current) drug therapy: Secondary | ICD-10-CM | POA: Diagnosis not present

## 2023-01-29 LAB — CBC WITH DIFFERENTIAL/PLATELET
Abs Immature Granulocytes: 0.02 10*3/uL (ref 0.00–0.07)
Basophils Absolute: 0 10*3/uL (ref 0.0–0.1)
Basophils Relative: 0 %
Eosinophils Absolute: 0.1 10*3/uL (ref 0.0–0.5)
Eosinophils Relative: 1 %
HCT: 40.8 % (ref 36.0–46.0)
Hemoglobin: 13.8 g/dL (ref 12.0–15.0)
Immature Granulocytes: 0 %
Lymphocytes Relative: 56 %
Lymphs Abs: 4 10*3/uL (ref 0.7–4.0)
MCH: 30.2 pg (ref 26.0–34.0)
MCHC: 33.8 g/dL (ref 30.0–36.0)
MCV: 89.3 fL (ref 80.0–100.0)
Monocytes Absolute: 0.4 10*3/uL (ref 0.1–1.0)
Monocytes Relative: 6 %
Neutro Abs: 2.7 10*3/uL (ref 1.7–7.7)
Neutrophils Relative %: 37 %
Platelets: 227 10*3/uL (ref 150–400)
RBC: 4.57 MIL/uL (ref 3.87–5.11)
RDW: 16 % — ABNORMAL HIGH (ref 11.5–15.5)
WBC: 7.3 10*3/uL (ref 4.0–10.5)
nRBC: 0 % (ref 0.0–0.2)

## 2023-02-04 DIAGNOSIS — F429 Obsessive-compulsive disorder, unspecified: Secondary | ICD-10-CM | POA: Diagnosis not present

## 2023-02-04 DIAGNOSIS — F319 Bipolar disorder, unspecified: Secondary | ICD-10-CM | POA: Diagnosis not present

## 2023-02-25 ENCOUNTER — Encounter (INDEPENDENT_AMBULATORY_CARE_PROVIDER_SITE_OTHER): Payer: Self-pay | Admitting: Physician Assistant

## 2023-02-25 ENCOUNTER — Ambulatory Visit (INDEPENDENT_AMBULATORY_CARE_PROVIDER_SITE_OTHER): Payer: Medicare HMO | Admitting: Physician Assistant

## 2023-02-25 VITALS — BP 120/82 | HR 100 | Temp 98.0°F | Ht 63.0 in | Wt 260.0 lb

## 2023-02-25 DIAGNOSIS — E538 Deficiency of other specified B group vitamins: Secondary | ICD-10-CM | POA: Diagnosis not present

## 2023-02-25 DIAGNOSIS — E559 Vitamin D deficiency, unspecified: Secondary | ICD-10-CM | POA: Diagnosis not present

## 2023-02-25 DIAGNOSIS — Z6841 Body Mass Index (BMI) 40.0 and over, adult: Secondary | ICD-10-CM

## 2023-02-25 DIAGNOSIS — R7303 Prediabetes: Secondary | ICD-10-CM

## 2023-02-25 MED ORDER — CYANOCOBALAMIN 500 MCG PO TABS: 500.0000 ug | ORAL_TABLET | Freq: Every day | ORAL | 0 refills | Status: DC

## 2023-02-25 MED ORDER — VITAMIN D (ERGOCALCIFEROL) 1.25 MG (50000 UNIT) PO CAPS
50000.0000 [IU] | ORAL_CAPSULE | ORAL | 0 refills | Status: DC
Start: 2023-02-25 — End: 2023-05-20

## 2023-02-25 NOTE — Progress Notes (Signed)
SUBJECTIVE:  Chief Complaint: Obesity  Interim History: She is down 2 lbs since last visit.  Down 27 lbs overall TBW loss of 9.4%  Brenda Hernandez, a 47 year old individual with obesity and vitamin D deficiency, has been adhering to a treatment plan involving metformin 1000mg  twice daily and ergocalciferol 50,000 units every 14 days. She also takes vitamin B12 500 micrograms daily. She reports a recent weight loss, which she attributes to dietary changes, including portion control during meals and the introduction of fruit smoothies into her diet. She has also been mindful of her late-night eating habits and has shifted her meal times to earlier in the evening.  She reports a recent fall due to her foot falling asleep, resulting in knee and back pain. She has been using a cane for mobility since the incident. She has a history of arthritis in the knee, which was previously managed with a cortisone injection and Voltaren gel, both of which she reports were ineffective. She is considering seeking orthopedic consultation if the pain does not improve.  She also reports a history of constipation, which she believes may be related to her cereal intake. She has been alternating between [employer] and maple brown sugar oatmeal for breakfast. She has been mindful of her water intake to manage the fiber content in her diet. She has also been incorporating [place] [employer] into her meals, which she believes to be a healthier choice. She has been making a conscious effort to read food labels and make better dietary choices. Brenda Hernandez is here to discuss her progress with her obesity treatment plan. She is on the Category 2 Plan and states she is following her eating plan approximately 50 % of the time. She states she is not exercising 0 minutes 0 times per week due to a recent fall.   OBJECTIVE: Visit Diagnoses: Problem List Items Addressed This Visit     Pre-diabetes - Primary   Vitamin D  deficiency   Relevant Medications   Vitamin D, Ergocalciferol, (DRISDOL) 1.25 MG (50000 UNIT) CAPS capsule   Low serum vitamin B12   Relevant Medications   cyanocobalamin (VITAMIN B12) 500 MCG tablet   Morbid obesity (HCC)   BMI 45.0-49.9, adult (HCC)   Other Visit Diagnoses       B12 deficiency         Obesity 47 year old following up for obesity management. Significant lifestyle changes include portion control, incorporating smoothies with fruits and almond milk, and avoiding late-night eating. Lost 12 pounds since October and 27 pounds since starting the program. Currently on metformin 1000 mg BID, ergocalciferol 50,000 units biweekly, and vitamin B12 500 mcg daily. Discussed benefits of continued weight loss, including improved mobility and reduced comorbidities. Encouraged to switch from Frosted Flakes to oatmeal or old-fashioned oats with added fruits and cinnamon for better nutritional value. - Continue metformin, ergocalciferol, and vitamin B12 - Encourage portion control and healthy eating habits - Recommend switching to oatmeal or old-fashioned oats with added fruits and cinnamon - Follow-up on January 23 at 2 PM   B 12 deficiency: Low B 12 levels with last level 323 01/02/23. Advised to take B 12 500 mcg daily for support which may help to increase energy levels and metabolism.    Vitamin D Deficiency Vitamin D is at goal of 50.  Most recent vitamin D level was 70.31. She is on  prescription ergocalciferol 50,000 IU every 14 days. Lab Results  Component Value Date   VD25OH 70.31 01/02/2023  VD25OH 31.9 06/17/2022   VD25OH 52.44 12/28/2020    Plan: Continue and refill  prescription ergocalciferol 50,000 IU every 14 days Low vitamin D levels can be associated with adiposity and may result in leptin resistance and weight gain. Also associated with fatigue. Currently on vitamin D supplementation without any adverse effects.   Knee Pain Reports knee pain following a  fall 1-2 weeks ago. Has arthritis in the knee, previously tried cortisone injections without relief. Not used Voltaren gel since the fall. Discussed using Voltaren gel for symptomatic relief and advised monitoring symptoms. If no improvement in a week, consider orthopedic referral. - Use Voltaren gel on the knee up to three times daily - Monitor symptoms; if no improvement in a week, consider orthopedic referral  General Health Maintenance Maintaining vitamin D supplementation and adhering to vitamin B12 regimen. - Refill ergocalciferol 50,000 units biweekly - Continue vitamin B12 500 mcg daily  Follow-up - Follow-up on January 23 at 2 PM.  Vitals Temp: 98 F (36.7 C) BP: 120/82 Pulse Rate: 100 SpO2: 99 %   Anthropometric Measurements Height: 5\' 3"  (1.6 m) Weight: 260 lb (117.9 kg) BMI (Calculated): 46.07 Weight at Last Visit: 262 lb Weight Lost Since Last Visit: 2 lb Weight Gained Since Last Visit: 0 Starting Weight: 287 lb Total Weight Loss (lbs): 27 lb (12.2 kg) Peak Weight: 303 lb   Body Composition  Body Fat %: 56.7 % Fat Mass (lbs): 147.6 lbs Muscle Mass (lbs): 1068 lbs Visceral Fat Rating : 18   Other Clinical Data Fasting: yes Labs: no Today's Visit #: 45 Starting Date: 03/26/20     ASSESSMENT AND PLAN:  Diet: Brenda Hernandez is currently in the action stage of change. As such, her goal is to continue with weight loss efforts. She has agreed to Category 2 Plan.  Exercise: Brenda Hernandez has been instructed  resume exercise once able to   for weight loss and overall health benefits.   Behavior Modification:  We discussed the following Behavioral Modification Strategies today: increasing lean protein intake, decreasing simple carbohydrates, increasing vegetables, increase H2O intake, increase high fiber foods, keeping healthy foods in the home, better snacking choices, holiday eating strategies, and planning for success. We discussed various medication options to  help Brenda Hernandez with her weight loss efforts and we both agreed to continue metformin for prediabetes.  Return in about 6 weeks (around 04/08/2023).Marland Kitchen She was informed of the importance of frequent follow up visits to maximize her success with intensive lifestyle modifications for her multiple health conditions.  Attestation Statements:   Reviewed by clinician on day of visit: allergies, medications, problem list, medical history, surgical history, family history, social history, and previous encounter notes.   Time spent on visit including pre-visit chart review and post-visit care and charting was 32 minutes.    Emilo Gras, PA-C

## 2023-03-05 ENCOUNTER — Other Ambulatory Visit (HOSPITAL_COMMUNITY)
Admission: RE | Admit: 2023-03-05 | Discharge: 2023-03-05 | Disposition: A | Discharge status: Home / Self Care | Payer: Medicare HMO | Attending: Psychology | Admitting: Psychology

## 2023-03-05 DIAGNOSIS — E559 Vitamin D deficiency, unspecified: Secondary | ICD-10-CM | POA: Insufficient documentation

## 2023-03-05 DIAGNOSIS — Z79899 Other long term (current) drug therapy: Secondary | ICD-10-CM | POA: Insufficient documentation

## 2023-03-05 LAB — CBC WITH DIFFERENTIAL/PLATELET
Abs Immature Granulocytes: 0.03 10*3/uL (ref 0.00–0.07)
Basophils Absolute: 0 10*3/uL (ref 0.0–0.1)
Basophils Relative: 0 %
Eosinophils Absolute: 0 10*3/uL (ref 0.0–0.5)
Eosinophils Relative: 0 %
HCT: 39.6 % (ref 36.0–46.0)
Hemoglobin: 13.6 g/dL (ref 12.0–15.0)
Immature Granulocytes: 0 %
Lymphocytes Relative: 46 %
Lymphs Abs: 3.5 10*3/uL (ref 0.7–4.0)
MCH: 30.6 pg (ref 26.0–34.0)
MCHC: 34.3 g/dL (ref 30.0–36.0)
MCV: 89.2 fL (ref 80.0–100.0)
Monocytes Absolute: 0.3 10*3/uL (ref 0.1–1.0)
Monocytes Relative: 4 %
Neutro Abs: 3.7 10*3/uL (ref 1.7–7.7)
Neutrophils Relative %: 50 %
Platelets: 224 10*3/uL (ref 150–400)
RBC: 4.44 MIL/uL (ref 3.87–5.11)
RDW: 17.7 % — ABNORMAL HIGH (ref 11.5–15.5)
WBC: 7.6 10*3/uL (ref 4.0–10.5)
nRBC: 0 % (ref 0.0–0.2)

## 2023-03-05 LAB — COMPREHENSIVE METABOLIC PANEL
ALT: 11 U/L (ref 0–44)
AST: 14 U/L — ABNORMAL LOW (ref 15–41)
Albumin: 3 g/dL — ABNORMAL LOW (ref 3.5–5.0)
Alkaline Phosphatase: 60 U/L (ref 38–126)
Anion gap: 10 (ref 5–15)
BUN: 9 mg/dL (ref 6–20)
CO2: 24 mmol/L (ref 22–32)
Calcium: 8.7 mg/dL — ABNORMAL LOW (ref 8.9–10.3)
Chloride: 102 mmol/L (ref 98–111)
Creatinine, Ser: 0.92 mg/dL (ref 0.44–1.00)
GFR, Estimated: >60 mL/min (ref >60)
Glucose, Bld: 89 mg/dL (ref 70–99)
Potassium: 4.1 mmol/L (ref 3.5–5.1)
Sodium: 136 mmol/L (ref 135–145)
Total Bilirubin: 0.6 mg/dL (ref <1.2)
Total Protein: 7 g/dL (ref 6.5–8.1)

## 2023-03-05 LAB — TSH: TSH: 2.678 u[IU]/mL (ref 0.350–4.500)

## 2023-03-05 LAB — LIPID PANEL
Cholesterol: 175 mg/dL (ref 0–200)
HDL: 64 mg/dL (ref >40)
LDL Cholesterol: 91 mg/dL (ref 0–99)
Total CHOL/HDL Ratio: 2.7 {ratio}
Triglycerides: 98 mg/dL (ref <150)
VLDL: 20 mg/dL (ref 0–40)

## 2023-03-05 LAB — HEMOGLOBIN A1C
Hgb A1c MFr Bld: 5.5 % (ref 4.8–5.6)
Mean Plasma Glucose: 111.15 mg/dL

## 2023-03-05 LAB — VITAMIN B12: Vitamin B-12: 208 pg/mL (ref 180–914)

## 2023-03-05 LAB — VITAMIN D 25 HYDROXY (VIT D DEFICIENCY, FRACTURES): Vit D, 25-Hydroxy: 71.9 ng/mL (ref 30–100)

## 2023-03-06 LAB — INSULIN, RANDOM: Insulin: 20.7 u[IU]/mL (ref 2.6–24.9)

## 2023-03-24 ENCOUNTER — Ambulatory Visit: Payer: Self-pay | Admitting: Internal Medicine

## 2023-03-24 NOTE — Telephone Encounter (Signed)
 Copied from CRM [id number]. Topic: Clinical - Pink Word Triage >> Mar 24, 2023  1:03 PM Pinkey ORN wrote: Reason for Triage: Involuntary Eye Movement , Shaking Hands  Chief Complaint: upward eye gaze & hand shake Symptoms: Bilateral upward eye gauze and bilateral hand shaking Frequency: several weeks or more Pertinent Negatives: Patient denies eye drainage, blurred vision  Disposition: [] ED /[] Urgent Care (no appt availability in office) / [x] Appointment(In office/virtual)/ []  [facility]/ [] Home Care/ [] Refused Recommended Disposition /[] [facility]/ []  Follow-up with PCP Additional Notes: pt states Mom noticed pt eyes sometimes tend to look upward as if pt looking into sky in a daze: pt is unaware when it happens> Mom noticed at times pt hands begin to shake uncontrollable and pt unaware  Reason for Disposition  [1] Loss of speech or garbled speech AND [2] is a chronic symptom (recurrent or ongoing AND present > 4 weeks)    Please note: pt states Mom noticed pt eyes sometimes tend to look upward as if pt looking into sky in a daze: pt is unaware when it happens> Mom noticed at times pt hands begin to shake uncontrollable and pt unaware  Answer Assessment - Initial Assessment Questions 1. SYMPTOM: What is the main symptom you are concerned about? (e.g., weakness, numbness)     Bilateral upward eye gauze and bilateral hand shaking 2. ONSET: When did this start? (minutes, hours, days; while sleeping)     Unknown - possibly gradual over several weeks or more 3. LAST NORMAL: When was the last time you (the patient) were normal (no symptoms)?     Unknown because pt is unaware when it happens: mom noticed and made pt aware 4. PATTERN Does this come and go, or has it been constant since it started?  Is it present now?     Comes and goes - hand shaking sometime consistent with increase in anxiety 5. CARDIAC SYMPTOMS: Have you had any of the following symptoms: chest  pain, difficulty breathing, palpitations?     no 6. NEUROLOGIC SYMPTOMS: Have you had any of the following symptoms: headache, dizziness, vision loss, double vision, changes in speech, unsteady on your feet?     no 7. OTHER SYMPTOMS: Do you have any other symptoms?     N/a 8. PREGNANCY: Is there any chance you are pregnant? When was your last menstrual period?     N/a  Protocols used: Neurologic Deficit-A-AH

## 2023-03-29 ENCOUNTER — Ambulatory Visit (INDEPENDENT_AMBULATORY_CARE_PROVIDER_SITE_OTHER): Payer: Medicare HMO | Admitting: Family Medicine

## 2023-03-29 ENCOUNTER — Encounter: Payer: Self-pay | Admitting: Family Medicine

## 2023-03-29 VITALS — BP 110/80 | HR 96 | Temp 97.9°F | Ht 63.0 in | Wt 260.2 lb

## 2023-03-29 DIAGNOSIS — H519 Unspecified disorder of binocular movement: Secondary | ICD-10-CM

## 2023-03-29 DIAGNOSIS — F258 Other schizoaffective disorders: Secondary | ICD-10-CM

## 2023-03-29 DIAGNOSIS — E559 Vitamin D deficiency, unspecified: Secondary | ICD-10-CM | POA: Diagnosis not present

## 2023-03-29 DIAGNOSIS — R251 Tremor, unspecified: Secondary | ICD-10-CM | POA: Diagnosis not present

## 2023-03-29 DIAGNOSIS — Z6841 Body Mass Index (BMI) 40.0 and over, adult: Secondary | ICD-10-CM | POA: Diagnosis not present

## 2023-03-29 DIAGNOSIS — E538 Deficiency of other specified B group vitamins: Secondary | ICD-10-CM

## 2023-03-29 DIAGNOSIS — T50905A Adverse effect of unspecified drugs, medicaments and biological substances, initial encounter: Secondary | ICD-10-CM | POA: Diagnosis not present

## 2023-03-29 NOTE — Progress Notes (Signed)
 Acute Office Visit  Subjective:     Patient ID: Brenda Hernandez, female    DOB: 1975/09/17, 48 y.o.   MRN: [id number]  Chief Complaint  Patient presents with   Eye Problem    Bilateral upward eye gaze for the past month (patient's mom notes seeing this occur at least 3 times). Notes no loss of consciousness but hands shake when it happens.    HPI Patient is in today for evaluation of unusual eye movements. She is accompanied by her mother who is acting as historian.  Mom reports that the patient has been having moments where her eyes lock on an area other than what the patient was immediately focused on.  Like when speaking to her mother, patient is looking above her and not moving and seems to be staring at something else. Patient denies hearing voices, seeing any hallucinations. Mom states that the patient can still speak with her and is responsive during these episodes.  Mom is noticed 3 episodes over the last month.  Reports history of tardive dyskinesia in the past with other psychiatric medications. Has never done genetic testing for psychiatric meds. Has labs scheduled for Friday at [facility] through psychiatry. Takes some high risk medications including Anafranil, clozapine , divalproex .  These are managed by psychiatry.  She does not see them for another month.  She has not let them know that symptoms are occurring. Denies loss of consciousness, numbness, tingling, vision changes,   ROS Per HPI      Objective:    BP 110/80   Pulse 96   Temp 97.9 F (36.6 C)   Ht 5' 3 (1.6 m)   Wt 260 lb 3.2 oz (118 kg)   SpO2 98%   BMI 46.09 kg/m    Physical Exam Vitals and nursing note reviewed.  Constitutional:      General: She is not in acute distress.    Appearance: Normal appearance. She is obese.  HENT:     Head: Normocephalic and atraumatic.     Right Ear: Tympanic membrane and ear canal normal.     Left Ear: Tympanic membrane and ear canal normal.     Nose:  Nose normal.     Mouth/Throat:     Mouth: Mucous membranes are moist.     Pharynx: Oropharynx is clear.  Eyes:     Extraocular Movements: Extraocular movements intact.     Conjunctiva/sclera: Conjunctivae normal.     Pupils: Pupils are equal, round, and reactive to light.  Cardiovascular:     Rate and Rhythm: Normal rate and regular rhythm.  Pulmonary:     Effort: Pulmonary effort is normal. No respiratory distress.     Breath sounds: Normal breath sounds. No wheezing, rhonchi or rales.  Musculoskeletal:        General: Normal range of motion.     Cervical back: Normal range of motion.  Lymphadenopathy:     Cervical: No cervical adenopathy.  Neurological:     General: No focal deficit present.     Mental Status: She is alert and oriented to person, place, and time. Mental status is at baseline.     Cranial Nerves: Cranial nerves 2-12 are intact.     Sensory: Sensation is intact.     Motor: Tremor (bilateral upper extremities) present.     Coordination: Coordination is intact.     Gait: Gait is intact.     Comments: Unable to complete cardinal gaze, pt looking straight at me throughout that  test. Able to follow directions for the rest of the exam.  Psychiatric:        Mood and Affect: Mood normal.        Thought Content: Thought content normal.    No results found for any visits on 03/29/23.      Assessment & Plan:  1. Tremor (Primary)  -Discussed that this could be from medication side effect  2. Abnormal eye movements  -Concerned that this could be medication side effect  3. Adverse effect of drug, initial encounter   4. Other schizoaffective disorders (HCC)  -Continue current medication regimen for now, please follow-up with psychiatry as soon as you can  5. Vitamin D  deficiency  -Follow-up with labs this weekend  6. Low serum vitamin B12  -Follow-up with labs this weekend  7. Morbid obesity with BMI of 45.0-49.9, adult (HCC)  -Follow-up with [facility]  -She is on multiple high risk medications -Low suspicion for seizure activity since she is still responsive during episodes, no postictal states afterwards -Discussed that these are likely medication side effects -Has never had genetic testing for psychiatric meds, discussed Gene site testing -Discussed for patient to go online to find out cost for this test, ask psych if they do this test.  If not, may come back and we can do it -Discussed when to seek more acute care in the ER for further evaluation   No orders of the defined types were placed in this encounter.   Return if symptoms worsen or fail to improve.  Corean Ku, FNP

## 2023-03-29 NOTE — Patient Instructions (Addendum)
 [email or web address] to put insurance info in to see if genetic testing is covered for psychiatric medications.  Your exam is reassuring today.  It's possible this is likely related to medications and side effects.  I would have you follow up with psych to inform them of symptoms.

## 2023-03-30 DIAGNOSIS — F319 Bipolar disorder, unspecified: Secondary | ICD-10-CM | POA: Diagnosis not present

## 2023-04-02 ENCOUNTER — Other Ambulatory Visit (HOSPITAL_COMMUNITY)
Admission: RE | Admit: 2023-04-02 | Discharge: 2023-04-02 | Disposition: A | Discharge status: Home / Self Care | Payer: Medicare HMO | Attending: Psychology | Admitting: Psychology

## 2023-04-02 DIAGNOSIS — Z79899 Other long term (current) drug therapy: Secondary | ICD-10-CM | POA: Diagnosis not present

## 2023-04-02 LAB — CBC WITH DIFFERENTIAL/PLATELET
Abs Immature Granulocytes: 0.02 10*3/uL (ref 0.00–0.07)
Basophils Absolute: 0 10*3/uL (ref 0.0–0.1)
Basophils Relative: 0 %
Eosinophils Absolute: 0.1 10*3/uL (ref 0.0–0.5)
Eosinophils Relative: 1 %
HCT: 38.9 % (ref 36.0–46.0)
Hemoglobin: 13.1 g/dL (ref 12.0–15.0)
Immature Granulocytes: 0 %
Lymphocytes Relative: 56 %
Lymphs Abs: 3.9 10*3/uL (ref 0.7–4.0)
MCH: 30.8 pg (ref 26.0–34.0)
MCHC: 33.7 g/dL (ref 30.0–36.0)
MCV: 91.5 fL (ref 80.0–100.0)
Monocytes Absolute: 0.3 10*3/uL (ref 0.1–1.0)
Monocytes Relative: 5 %
Neutro Abs: 2.6 10*3/uL (ref 1.7–7.7)
Neutrophils Relative %: 38 %
Platelets: 216 10*3/uL (ref 150–400)
RBC: 4.25 MIL/uL (ref 3.87–5.11)
RDW: 17.7 % — ABNORMAL HIGH (ref 11.5–15.5)
WBC: 6.9 10*3/uL (ref 4.0–10.5)
nRBC: 0 % (ref 0.0–0.2)

## 2023-04-07 NOTE — Progress Notes (Unsigned)
SUBJECTIVE: Discussed the use of AI scribe software for clinical note transcription with the patient, who gave verbal consent to proceed.  Chief Complaint: Obesity  Interim History: She is down 6 lbs since her last visit.  Down 33 lbs overall.  TBW loss of 11.5%  Brenda Hernandez, a 48 year old individual with a history of prediabetes, vitamin D deficiency, chronic bilateral knee pain, and bipolar affective disorder, presents for a follow-up visit regarding her obesity treatment plan. She reports a recent increase in knee pain, which has led to discussions about potential knee replacement surgery with Dr. Patsy Lager, an orthopedic surgeon.  The patient is actively working on weight loss to meet the BMI requirement for knee surgery, with a goal weight of ~225 lbs.  She has lost 6 lbs since the last visit, bringing her current weight to 254 lbs. This weight loss was achieved by focusing on nutrition, specifically by reducing sugar intake and incorporating healthier food options. She has been avoiding foods high in sugar and carbohydrates, such as Uncrustables and [place] toast, and has been incorporating more vegetables, particularly spinach, into her diet.  Despite these dietary changes, the patient acknowledges some challenges, including a preference for certain high-sugar foods and a dislike for light mayonnaise. She expresses interest in finding healthier versions of her favorite foods, such as chicken salad and oatmeal, and is open to exploring new recipes and nutrition websites. We discussed using [employer] as a good resource for healthier recipes.   The patient's weight loss efforts are motivated by her desire to undergo knee replacement surgery, which is currently hindered by her BMI. She is committed to continuing her weight loss journey and is encouraged by her recent progress.  [name] is here to discuss her progress with her obesity treatment plan. She is on the Category 2  Plan and states she is following her eating plan approximately 50 % of the time. She states she is not exercising 0 minutes 0 times per week.   OBJECTIVE: Visit Diagnoses: Problem List Items Addressed This Visit     Pre-diabetes - Primary   Vitamin D deficiency   Low serum vitamin B12   Bilateral chronic knee pain   Morbid obesity (HCC)   BMI 45.0-49.9, adult (HCC)  Obesity Brenda Hernandez, 47, has lost 6 pounds since her last visit, now weighing 254 pounds. She has lost 33 lbs overall.  Her target weight for knee surgery is ~225 pounds. She is improving her nutrition by reducing sugar intake and increasing vegetables and protein, and we discussed using resources like [employer]. - Continue dietary modifications focusing on reducing sugar intake and increasing protein. - Encourage use of healthier recipes from resources like [employer]. - Follow-up appointment on May 20, 2023, at 2 PM.  Chronic Bilateral Knee Pain Significant knee pain, anticipates total knee replacement in the future.  Weight loss to 225 pounds , BMI < 40 is required for surgery eligibility. - Continue weight loss efforts to reach 225 pounds for surgery eligibility. Follow up with Dr. Ranell Patrick as instructed.   Prediabetes Lab Results  Component Value Date   HGBA1C 5.5 03/05/2023   HGBA1C 5.4 01/02/2023   HGBA1C 5.6 06/17/2022   Lab Results  Component Value Date   LDLCALC 91 03/05/2023   CREATININE 0.92 03/05/2023    On metformin 1000 mg twice daily. Continued weight loss and dietary modifications are crucial for management. - Continue metformin 1000 mg twice daily with meals. Continue working on nutrition plan to decrease simple carbohydrates,  increase lean proteins and exercise to promote weight loss, improve glycemic control and prevent progression to Type 2 diabetes.   Vitamin D Deficiency Lab Results  Component Value Date   VD25OH 71.90 03/05/2023   VD25OH 70.31 01/02/2023   VD25OH 31.9  06/17/2022     On ergocalciferol 50,000 units every 14 days. No N/V or muscle weakness with Ergocalciferol. Low vitamin D levels can be associated with adiposity and may result in leptin resistance and weight gain. Also associated with fatigue. Currently on vitamin D supplementation without any adverse effects.   Continue ergocalciferol 50,000 units every 14 days.  Vitamin B12 Deficiency On vitamin B12, 500 mcg daily. - Continue vitamin B12, 500 mcg daily.  Bipolar Affective Disorder Currently seeing a therapist. No changes in management discussed.  Follow-up - Follow-up appointment on May 20, 2023, at 2 PM.  Vitals Temp: 98.6 F (37 C) BP: 95/67 Pulse Rate: (!) 106 SpO2: 96 %   Anthropometric Measurements Height: 5\' 3"  (1.6 m) Weight: 254 lb (115.2 kg) BMI (Calculated): 45.01 Weight at Last Visit: 260 lb Weight Lost Since Last Visit: 6 lb Weight Gained Since Last Visit: 0 Starting Weight: 287 lb Total Weight Loss (lbs): 33 lb (15 kg) Peak Weight: 303 lb   Body Composition  Body Fat %: 54.4 % Fat Mass (lbs): 138.4 lbs Muscle Mass (lbs): 110.2 lbs Visceral Fat Rating : 17   Other Clinical Data Fasting: no Labs: no Today's Visit #: 44 Starting Date: 03/26/20     ASSESSMENT AND PLAN:  Diet: Takiyah is currently in the action stage of change. As such, her goal is to continue with weight loss efforts. She has agreed to Category 2 Plan.  Exercise: Jobina has been instructed to try a geriatric exercise plan and that some exercise is better than none for weight loss and overall health benefits.   Behavior Modification:  We discussed the following Behavioral Modification Strategies today: increasing lean protein intake, decreasing simple carbohydrates, increasing vegetables, increase H2O intake, increase high fiber foods, meal planning and cooking strategies, keeping healthy foods in the home, better snacking choices, emotional eating strategies , avoiding  temptations, and planning for success. We discussed various medication options to help Hend with her weight loss efforts and we both agreed to continue her current regimen to work on nutritional and behavioral strategies to promote weight loss.  .  Return in about 4 weeks (around 05/06/2023).Marland Kitchen She was informed of the importance of frequent follow up visits to maximize her success with intensive lifestyle modifications for her multiple health conditions.  Attestation Statements:   Reviewed by clinician on day of visit: allergies, medications, problem list, medical history, surgical history, family history, social history, and previous encounter notes.   Time spent on visit including pre-visit chart review and post-visit care and charting was 39 minutes.    Rigley Niess, PA-C

## 2023-04-08 ENCOUNTER — Encounter (INDEPENDENT_AMBULATORY_CARE_PROVIDER_SITE_OTHER): Payer: Self-pay | Admitting: Physician Assistant

## 2023-04-08 ENCOUNTER — Ambulatory Visit (INDEPENDENT_AMBULATORY_CARE_PROVIDER_SITE_OTHER): Payer: Medicare HMO | Admitting: Physician Assistant

## 2023-04-08 VITALS — BP 95/67 | HR 106 | Temp 98.6°F | Ht 63.0 in | Wt 254.0 lb

## 2023-04-08 DIAGNOSIS — M25561 Pain in right knee: Secondary | ICD-10-CM

## 2023-04-08 DIAGNOSIS — E538 Deficiency of other specified B group vitamins: Secondary | ICD-10-CM

## 2023-04-08 DIAGNOSIS — E559 Vitamin D deficiency, unspecified: Secondary | ICD-10-CM

## 2023-04-08 DIAGNOSIS — R7303 Prediabetes: Secondary | ICD-10-CM | POA: Diagnosis not present

## 2023-04-08 DIAGNOSIS — F3160 Bipolar disorder, current episode mixed, unspecified: Secondary | ICD-10-CM

## 2023-04-08 DIAGNOSIS — M25562 Pain in left knee: Secondary | ICD-10-CM | POA: Diagnosis not present

## 2023-04-08 DIAGNOSIS — Z6841 Body Mass Index (BMI) 40.0 and over, adult: Secondary | ICD-10-CM

## 2023-04-08 DIAGNOSIS — G8929 Other chronic pain: Secondary | ICD-10-CM

## 2023-04-13 DIAGNOSIS — F3189 Other bipolar disorder: Secondary | ICD-10-CM | POA: Diagnosis not present

## 2023-04-13 DIAGNOSIS — F319 Bipolar disorder, unspecified: Secondary | ICD-10-CM | POA: Diagnosis not present

## 2023-04-22 ENCOUNTER — Other Ambulatory Visit (INDEPENDENT_AMBULATORY_CARE_PROVIDER_SITE_OTHER): Payer: Self-pay | Admitting: Physician Assistant

## 2023-04-22 DIAGNOSIS — E559 Vitamin D deficiency, unspecified: Secondary | ICD-10-CM

## 2023-04-27 DIAGNOSIS — F319 Bipolar disorder, unspecified: Secondary | ICD-10-CM | POA: Diagnosis not present

## 2023-04-27 DIAGNOSIS — F429 Obsessive-compulsive disorder, unspecified: Secondary | ICD-10-CM | POA: Diagnosis not present

## 2023-04-30 ENCOUNTER — Other Ambulatory Visit (HOSPITAL_COMMUNITY)
Admission: AD | Admit: 2023-04-30 | Discharge: 2023-04-30 | Disposition: A | Discharge status: Home / Self Care | Payer: Medicare HMO | Attending: Psychology | Admitting: Psychology

## 2023-04-30 DIAGNOSIS — E559 Vitamin D deficiency, unspecified: Secondary | ICD-10-CM | POA: Diagnosis not present

## 2023-04-30 DIAGNOSIS — E538 Deficiency of other specified B group vitamins: Secondary | ICD-10-CM | POA: Insufficient documentation

## 2023-04-30 DIAGNOSIS — R5383 Other fatigue: Secondary | ICD-10-CM | POA: Insufficient documentation

## 2023-04-30 DIAGNOSIS — R7303 Prediabetes: Secondary | ICD-10-CM | POA: Diagnosis not present

## 2023-04-30 LAB — CBC WITH DIFFERENTIAL/PLATELET
Abs Immature Granulocytes: 0.04 10*3/uL (ref 0.00–0.07)
Basophils Absolute: 0 10*3/uL (ref 0.0–0.1)
Basophils Relative: 0 %
Eosinophils Absolute: 0 10*3/uL (ref 0.0–0.5)
Eosinophils Relative: 0 %
HCT: 38.3 % (ref 36.0–46.0)
Hemoglobin: 13.3 g/dL (ref 12.0–15.0)
Immature Granulocytes: 1 %
Lymphocytes Relative: 56 %
Lymphs Abs: 4.2 10*3/uL — ABNORMAL HIGH (ref 0.7–4.0)
MCH: 31.6 pg (ref 26.0–34.0)
MCHC: 34.7 g/dL (ref 30.0–36.0)
MCV: 91 fL (ref 80.0–100.0)
Monocytes Absolute: 0.4 10*3/uL (ref 0.1–1.0)
Monocytes Relative: 5 %
Neutro Abs: 2.8 10*3/uL (ref 1.7–7.7)
Neutrophils Relative %: 38 %
Platelets: 220 10*3/uL (ref 150–400)
RBC: 4.21 MIL/uL (ref 3.87–5.11)
RDW: 16.7 % — ABNORMAL HIGH (ref 11.5–15.5)
WBC: 7.4 10*3/uL (ref 4.0–10.5)
nRBC: 0 % (ref 0.0–0.2)

## 2023-04-30 LAB — COMPREHENSIVE METABOLIC PANEL
ALT: 10 U/L (ref 0–44)
AST: 15 U/L (ref 15–41)
Albumin: 3 g/dL — ABNORMAL LOW (ref 3.5–5.0)
Alkaline Phosphatase: 56 U/L (ref 38–126)
Anion gap: 13 (ref 5–15)
BUN: 12 mg/dL (ref 6–20)
CO2: 21 mmol/L — ABNORMAL LOW (ref 22–32)
Calcium: 8.4 mg/dL — ABNORMAL LOW (ref 8.9–10.3)
Chloride: 104 mmol/L (ref 98–111)
Creatinine, Ser: 0.94 mg/dL (ref 0.44–1.00)
GFR, Estimated: >60 mL/min (ref >60)
Glucose, Bld: 93 mg/dL (ref 70–99)
Potassium: 3.9 mmol/L (ref 3.5–5.1)
Sodium: 138 mmol/L (ref 135–145)
Total Bilirubin: 0.5 mg/dL (ref 0.0–1.2)
Total Protein: 6.5 g/dL (ref 6.5–8.1)

## 2023-04-30 LAB — LIPID PANEL
Cholesterol: 185 mg/dL (ref 0–200)
HDL: 57 mg/dL (ref >40)
LDL Cholesterol: 103 mg/dL — ABNORMAL HIGH (ref 0–99)
Total CHOL/HDL Ratio: 3.2 {ratio}
Triglycerides: 125 mg/dL (ref <150)
VLDL: 25 mg/dL (ref 0–40)

## 2023-04-30 LAB — VITAMIN B12: Vitamin B-12: 978 pg/mL — ABNORMAL HIGH (ref 180–914)

## 2023-04-30 LAB — HEMOGLOBIN A1C
Hgb A1c MFr Bld: 5 % (ref 4.8–5.6)
Mean Plasma Glucose: 96.8 mg/dL

## 2023-04-30 LAB — TSH: TSH: 3.92 u[IU]/mL (ref 0.350–4.500)

## 2023-04-30 LAB — VITAMIN D 25 HYDROXY (VIT D DEFICIENCY, FRACTURES): Vit D, 25-Hydroxy: 80.08 ng/mL (ref 30–100)

## 2023-05-04 DIAGNOSIS — F319 Bipolar disorder, unspecified: Secondary | ICD-10-CM | POA: Diagnosis not present

## 2023-05-04 DIAGNOSIS — F429 Obsessive-compulsive disorder, unspecified: Secondary | ICD-10-CM | POA: Diagnosis not present

## 2023-05-20 ENCOUNTER — Encounter (INDEPENDENT_AMBULATORY_CARE_PROVIDER_SITE_OTHER): Payer: Self-pay | Admitting: Physician Assistant

## 2023-05-20 ENCOUNTER — Ambulatory Visit (INDEPENDENT_AMBULATORY_CARE_PROVIDER_SITE_OTHER): Payer: Medicare HMO | Admitting: Physician Assistant

## 2023-05-20 VITALS — BP 109/76 | HR 108 | Temp 97.5°F | Ht 63.0 in | Wt 250.0 lb

## 2023-05-20 DIAGNOSIS — G8929 Other chronic pain: Secondary | ICD-10-CM

## 2023-05-20 DIAGNOSIS — E559 Vitamin D deficiency, unspecified: Secondary | ICD-10-CM

## 2023-05-20 DIAGNOSIS — M25562 Pain in left knee: Secondary | ICD-10-CM

## 2023-05-20 DIAGNOSIS — R7303 Prediabetes: Secondary | ICD-10-CM

## 2023-05-20 DIAGNOSIS — Z6841 Body Mass Index (BMI) 40.0 and over, adult: Secondary | ICD-10-CM

## 2023-05-20 DIAGNOSIS — M25561 Pain in right knee: Secondary | ICD-10-CM

## 2023-05-20 NOTE — Progress Notes (Signed)
 SUBJECTIVE: Discussed the use of AI scribe software for clinical note transcription with the patient, who gave verbal consent to proceed.  Chief Complaint: Obesity  Interim History: She is down 4 lbs since last visit. Reports she was even lower back in February, but regained a few pounds.   Brenda Hernandez is here to discuss her progress with her obesity treatment plan. She is on the Category 2 Plan and keeping a food journal and adhering to recommended goals of 1200-1300 calories and 85 grams of protein and states she is following her eating plan approximately 50 % of the time. She states she is exercising walking around her house  7 times per week.  Brenda Hernandez is a 48 year old female with obesity who presents for a follow-up on her obesity treatment plan.  Her weight is 250 pounds, stable since May 04, 2023, but reflects a loss since January. She attributes some weight loss to previous efforts but acknowledges a need to resume food journaling.  Her dietary goals include consuming 1,200 to 1,300 calories and 85 grams of protein daily. She has been trying to incorporate more vegetables and is exploring high-protein options like edamame and lentils. She mentions consuming oatmeal and cottage cheese as part of her diet.  She has a history of prediabetes, vitamin D deficiency, low serum B12, and chronic bilateral knee pain. She is currently on several psychotropic medications for schizoaffective/bipolar affective disorder.  She reports a recent fall, which has exacerbated her knee pain beyond her usual baseline. She is unsure if she informed her regular doctor about the fall but notes that her condition is slowly improving. No significant bruising is present, but the pain is slightly more than usual.We discussed contacting her PCP or orthopedist to have her knee evaluated.   OBJECTIVE: Visit Diagnoses: Problem List Items Addressed This Visit     Pre-diabetes - Primary   Vitamin D deficiency    Bilateral chronic knee pain   Morbid obesity (HCC)   Other Visit Diagnoses       BMI 40.0-44.9, adult (HCC) Current BMI 44.3         Obesity Brenda Hernandez, a 48 year old female, has lost four pounds since January but then plateaued over the past month. She was advised to consume 1200-1300 calories and 85 grams of protein daily. She needs to resume food journaling and incorporate more vegetables and high-protein foods into her diet. Discussed benefits of high-protein vegetables like edamame and lentils, and using Fairlife milk for additional protein. Emphasized the importance of consistent dietary habits and food journaling for weight management. - Resume food journaling - Consume 1200-1300 calories and 85 grams of protein daily - Incorporate high-protein vegetables such as edamame and lentils - Consider adding Fairlife milk for additional protein - Follow-up on April 3rd at 2 PM  Prediabetes Weight management and dietary modifications are crucial to prevent progression to diabetes. Emphasized maintaining current dietary plan and weight management strategies. A1c at goal. Insulin - not at goal.  On Metformin 1000 mg BID without GI or other side effect.  Lab Results  Component Value Date   HGBA1C 5.0 04/30/2023   HGBA1C 5.5 03/05/2023   HGBA1C 5.4 01/02/2023   Lab Results  Component Value Date   LDLCALC 103 (H) 04/30/2023   CREATININE 0.94 04/30/2023   INSULIN  Date Value Ref Range Status  06/17/2022 9.9 2.6 - 24.9 uIU/mL Final  Continue metformin 1000 mg BID prescribed by other provider.  Co2 slightly low on high dose  metformin.  Will plan to recheck BMET in 2 months.  Continue working on nutrition plan to decrease simple carbohydrates, increase lean proteins and exercise to promote weight loss, improve glycemic control and prevent progression to Type 2 diabetes.    Chronic Bilateral Knee Pain Chronic bilateral knee pain exacerbated by a recent fall. Advised to inform her  regular doctor for further evaluation, which may include an x-ray or specialist referral if pain persists. - Inform regular doctor about recent fall - Consider x-ray or specialist referral if pain persists  Vitamin D Deficiency Vitamin D is at goal of 50.  Most recent vitamin D level was 80. She is on  prescription ergocalciferol 50,000 IU every 14 days- course completed. No N/V or muscle weakness with Ergo.  Lab Results  Component Value Date   VD25OH 80.08 04/30/2023   VD25OH 71.90 03/05/2023   VD25OH 70.31 01/02/2023    Plan: Discontinue  prescription ergocalciferol 50,000 IU every 14 days as level continues to rise slightly.  Will plan to recheck vitamin D over the next 2 months Low vitamin D levels can be associated with adiposity and may result in leptin resistance and weight gain. Also associated with fatigue.  Currently on vitamin D supplementation without any adverse effects such as nausea, vomiting or muscle weakness.    Follow-up - Follow-up on April 3rd at 2 PM. Plan follow up labs in about 2 months.   Vitals Temp: (!) 97.5 F (36.4 C) BP: 109/76 Pulse Rate: (!) 108 SpO2: 97 %   Anthropometric Measurements Height: 5\' 3"  (1.6 m) Weight: 250 lb (113.4 kg) BMI (Calculated): 44.3 Weight at Last Visit: 254 lb Weight Lost Since Last Visit: 4 lb Weight Gained Since Last Visit: 0 Starting Weight: 287 lb Total Weight Loss (lbs): 37 lb (16.8 kg) Peak Weight: 303 lb   Body Composition  Body Fat %: 55 % Fat Mass (lbs): 137.6 lbs Muscle Mass (lbs): 106.8 lbs Visceral Fat Rating : 17   Other Clinical Data Fasting: yes Labs: no Today's Visit #: 45 Starting Date: 03/26/20     ASSESSMENT AND PLAN:  Diet: Brenda Hernandez is currently in the action stage of change. As such, her goal is to continue with weight loss efforts and has agreed to the Category 2 Plan and keeping a food journal and adhering to recommended goals of 1200-1300 calories and 85 grams of protein.    Exercise:  All adults should avoid inactivity. Some activity is better than none, and adults who participate in any amount of physical activity, gain some health benefits.  Behavior Modification:  We discussed the following Behavioral Modification Strategies today: increasing lean protein intake, decreasing simple carbohydrates, increasing vegetables, increase H2O intake, increase high fiber foods, no skipping meals, meal planning and cooking strategies, better snacking choices, emotional eating strategies , avoiding temptations, planning for success, and keep a strict food journal. We discussed various medication options to help Brenda Hernandez with her weight loss efforts and we both agreed to continue to work on nutritional and behavioral strategies to promote weight loss.  And continue current medications. .  Return in about 4 weeks (around 06/17/2023).Marland Kitchen She was informed of the importance of frequent follow up visits to maximize her success with intensive lifestyle modifications for her multiple health conditions.  Attestation Statements:   Reviewed by clinician on day of visit: allergies, medications, problem list, medical history, surgical history, family history, social history, and previous encounter notes.   Time spent on visit including pre-visit chart review and  post-visit care and charting was 46 minutes  Brenda Brake,PA-C

## 2023-05-25 DIAGNOSIS — F319 Bipolar disorder, unspecified: Secondary | ICD-10-CM | POA: Diagnosis not present

## 2023-05-25 DIAGNOSIS — F3189 Other bipolar disorder: Secondary | ICD-10-CM | POA: Diagnosis not present

## 2023-05-27 ENCOUNTER — Other Ambulatory Visit (HOSPITAL_COMMUNITY)
Admission: AD | Admit: 2023-05-27 | Discharge: 2023-05-27 | Disposition: A | Discharge status: Home / Self Care | Attending: Psychology | Admitting: Psychology

## 2023-05-27 DIAGNOSIS — E538 Deficiency of other specified B group vitamins: Secondary | ICD-10-CM | POA: Diagnosis not present

## 2023-05-27 LAB — CBC WITH DIFFERENTIAL/PLATELET
Abs Immature Granulocytes: 0.03 10*3/uL (ref 0.00–0.07)
Basophils Absolute: 0 10*3/uL (ref 0.0–0.1)
Basophils Relative: 0 %
Eosinophils Absolute: 0 10*3/uL (ref 0.0–0.5)
Eosinophils Relative: 0 %
HCT: 37.1 % (ref 36.0–46.0)
Hemoglobin: 12.8 g/dL (ref 12.0–15.0)
Immature Granulocytes: 0 %
Lymphocytes Relative: 64 %
Lymphs Abs: 4.7 10*3/uL — ABNORMAL HIGH (ref 0.7–4.0)
MCH: 32.2 pg (ref 26.0–34.0)
MCHC: 34.5 g/dL (ref 30.0–36.0)
MCV: 93.5 fL (ref 80.0–100.0)
Monocytes Absolute: 0.3 10*3/uL (ref 0.1–1.0)
Monocytes Relative: 4 %
Neutro Abs: 2.4 10*3/uL (ref 1.7–7.7)
Neutrophils Relative %: 32 %
Platelets: 191 10*3/uL (ref 150–400)
RBC: 3.97 MIL/uL (ref 3.87–5.11)
RDW: 16.1 % — ABNORMAL HIGH (ref 11.5–15.5)
WBC: 7.5 10*3/uL (ref 4.0–10.5)
nRBC: 0 % (ref 0.0–0.2)

## 2023-06-08 DIAGNOSIS — F3189 Other bipolar disorder: Secondary | ICD-10-CM | POA: Diagnosis not present

## 2023-06-08 DIAGNOSIS — F319 Bipolar disorder, unspecified: Secondary | ICD-10-CM | POA: Diagnosis not present

## 2023-06-15 ENCOUNTER — Encounter: Payer: Self-pay | Admitting: Internal Medicine

## 2023-06-15 ENCOUNTER — Ambulatory Visit (INDEPENDENT_AMBULATORY_CARE_PROVIDER_SITE_OTHER): Payer: Medicare HMO | Admitting: Internal Medicine

## 2023-06-15 VITALS — BP 122/88 | HR 115 | Temp 98.6°F | Ht 63.0 in | Wt 253.2 lb

## 2023-06-15 DIAGNOSIS — E538 Deficiency of other specified B group vitamins: Secondary | ICD-10-CM

## 2023-06-15 DIAGNOSIS — F258 Other schizoaffective disorders: Secondary | ICD-10-CM

## 2023-06-15 DIAGNOSIS — R270 Ataxia, unspecified: Secondary | ICD-10-CM | POA: Insufficient documentation

## 2023-06-15 DIAGNOSIS — R32 Unspecified urinary incontinence: Secondary | ICD-10-CM | POA: Diagnosis not present

## 2023-06-15 DIAGNOSIS — R7303 Prediabetes: Secondary | ICD-10-CM | POA: Diagnosis not present

## 2023-06-15 NOTE — Assessment & Plan Note (Signed)
 Not new Check UA

## 2023-06-15 NOTE — Assessment & Plan Note (Signed)
 Likely due to Clozaril, Anafranil and Depakote ER side effects. Probable early  tardive dyskinesia sx's Mild hand tremor Ataxic, dystonic gait Spastic back Neurology ref - Dr Arbutus Leas

## 2023-06-15 NOTE — Progress Notes (Signed)
 Subjective:  Patient ID: Brenda Hernandez, female    DOB: 30-Apr-1975  Age: 48 y.o. MRN: [id number]  CC: Follow-up and Fall (Becoming more frequent, last fall about 3 days ago. Hurt bilateral knees, and back)   HPI Brenda Hernandez presents for unsteady gait - falls and near-falls lately, stumbling x several months. No new meds... F/u on OA, hyperglycemia, obesity  Outpatient Medications Prior to Visit  Medication Sig Dispense Refill   aspirin EC 81 MG tablet Take 81 mg by mouth 2 (two) times daily. Swallow whole.     clomiPRAMINE (ANAFRANIL) 75 MG capsule Take 75 mg by mouth 2 (two) times daily.     cloZAPine (CLOZARIL) 100 MG tablet Take 5 tablets (500 mg total) by mouth daily. 150 tablet 2   cyanocobalamin (VITAMIN B12) 500 MCG tablet Take 1 tablet (500 mcg total) by mouth daily. 90 tablet 0   divalproex (DEPAKOTE ER) 500 MG 24 hr tablet Take 1 tablet (500 mg total) by mouth daily. (Patient taking differently: Take 2,000 mg by mouth daily. Take 4 po in the evening) 30 tablet 5   fluticasone (FLONASE) 50 MCG/ACT nasal spray Place 1 spray into both nostrils daily. 16 g 6   levonorgestrel-ethinyl estradiol (ALESSE) 0.1-20 MG-MCG tablet Sronyx 0.1 mg-20 mcg tablet  TAKE 1 TABLET BY MOUTH EVERY DAY     metFORMIN (GLUCOPHAGE) 1000 MG tablet Take 1,000 mg by mouth 2 (two) times daily with a meal.     oxyCODONE (ROXICODONE) 5 MG immediate release tablet Take 1 tablet (5 mg total) by mouth every 6 (six) hours as needed for severe pain. 10 tablet 0   No facility-administered medications prior to visit.    ROS: Review of Systems  Constitutional:  Positive for fatigue. Negative for activity change, appetite change, chills and unexpected weight change.  HENT:  Negative for congestion, mouth sores and sinus pressure.   Eyes:  Negative for visual disturbance.  Respiratory:  Negative for cough and chest tightness.   Gastrointestinal:  Negative for abdominal pain and nausea.  Genitourinary:  Negative  for difficulty urinating, frequency and vaginal pain.  Musculoskeletal:  Positive for arthralgias and gait problem. Negative for back pain.  Skin:  Negative for pallor and rash.  Neurological:  Positive for weakness. Negative for dizziness, tremors, seizures, syncope, numbness and headaches.  Hematological:  Does not bruise/bleed easily.  Psychiatric/Behavioral:  Negative for confusion, decreased concentration, dysphoric mood, sleep disturbance and suicidal ideas. The patient is not nervous/anxious.     Objective:  BP 122/88 (BP Location: Left Arm, Patient Position: Sitting)   Pulse (!) 115   Temp 98.6 F (37 C) (Temporal)   Ht 5\' 3"  (1.6 m)   Wt 253 lb 3.2 oz (114.9 kg)   SpO2 95%   BMI 44.85 kg/m   BP Readings from Last 3 Encounters:  06/15/23 122/88  05/20/23 109/76  04/08/23 95/67    Wt Readings from Last 3 Encounters:  06/15/23 253 lb 3.2 oz (114.9 kg)  05/20/23 250 lb (113.4 kg)  04/08/23 254 lb (115.2 kg)    Physical Exam Constitutional:      General: She is not in acute distress.    Appearance: She is well-developed. She is obese. She is not ill-appearing.  HENT:     Head: Normocephalic.     Right Ear: External ear normal.     Left Ear: External ear normal.     Nose: Nose normal.  Eyes:     General:  Right eye: No discharge.        Left eye: No discharge.     Conjunctiva/sclera: Conjunctivae normal.     Pupils: Pupils are equal, round, and reactive to light.  Neck:     Thyroid: No thyromegaly.     Vascular: No JVD.     Trachea: No tracheal deviation.  Cardiovascular:     Rate and Rhythm: Normal rate and regular rhythm.     Heart sounds: Normal heart sounds.  Pulmonary:     Effort: No respiratory distress.     Breath sounds: No stridor. No wheezing.  Abdominal:     General: Bowel sounds are normal. There is no distension.     Palpations: Abdomen is soft. There is no mass.     Tenderness: There is no abdominal tenderness. There is no guarding or  rebound.  Musculoskeletal:        General: No tenderness.     Cervical back: Normal range of motion and neck supple. No rigidity.     Right lower leg: No edema.     Left lower leg: No edema.  Lymphadenopathy:     Cervical: No cervical adenopathy.  Skin:    Findings: No erythema or rash.  Neurological:     Mental Status: She is oriented to person, place, and time.     Cranial Nerves: No cranial nerve deficit.     Motor: No abnormal muscle tone.     Coordination: Coordination abnormal.     Gait: Gait abnormal.     Deep Tendon Reflexes: Reflexes normal.  Psychiatric:        Behavior: Behavior normal.        Thought Content: Thought content normal.        Judgment: Judgment normal.   Leaning over the L side Mild hand tremor Ataxic, dystonic gait Spastic back Using a cane    A total time of 45 minutes was spent preparing to see the patient, reviewing tests, x-rays, operative reports and other medical records.  Also, obtaining history and performing comprehensive physical exam.  Additionally, counseling the patient regarding the above listed issues - ataxia, falls.   Finally, documenting clinical information in the health records, coordination of care, educating the patient. It is a complex case.   Lab Results  Component Value Date   WBC 7.5 05/27/2023   HGB 12.8 05/27/2023   HCT 37.1 05/27/2023   PLT 191 05/27/2023   GLUCOSE 93 04/30/2023   CHOL 185 04/30/2023   TRIG 125 04/30/2023   HDL 57 04/30/2023   LDLCALC 103 (H) 04/30/2023   ALT 10 04/30/2023   AST 15 04/30/2023   NA 138 04/30/2023   K 3.9 04/30/2023   CL 104 04/30/2023   CREATININE 0.94 04/30/2023   BUN 12 04/30/2023   CO2 21 (L) 04/30/2023   TSH 3.920 04/30/2023   HGBA1C 5.0 04/30/2023    No results found.  Assessment & Plan:   Problem List Items Addressed This Visit     Pre-diabetes - Primary   Check A1c Labs are pending  w/Healthy wt management Max wt was 303 lbs. Pt's goal is 225 lbs       Schizoaffective disorder (HCC)   Cont w/Clozaril, Anafranil and Depakote ER       Low serum vitamin B12   On B complex      Morbid obesity (HCC)   Max wt was 303 lbs. Pt's goal is 225 lbs      Incontinence in female  Not new Check UA      Ataxia   Likely due to Clozaril, Anafranil and Depakote ER side effects. Probable early  tardive dyskinesia sx's Mild hand tremor Ataxic, dystonic gait Spastic back Neurology ref - Dr Tat      Relevant Orders   Ambulatory referral to Neurology      No orders of the defined types were placed in this encounter.     Follow-up: No follow-ups on file.  Sonda Primes, MD

## 2023-06-15 NOTE — Assessment & Plan Note (Signed)
On B complex 

## 2023-06-15 NOTE — Assessment & Plan Note (Signed)
Max wt was 303 lbs. Pt's goal is 225 lbs

## 2023-06-15 NOTE — Assessment & Plan Note (Addendum)
 Check A1c Labs are pending  w/Healthy wt management Max wt was 303 lbs. Pt's goal is 225 lbs

## 2023-06-15 NOTE — Assessment & Plan Note (Signed)
 Cont w/Clozaril, Anafranil and Depakote ER

## 2023-06-16 ENCOUNTER — Encounter: Payer: Self-pay | Admitting: Neurology

## 2023-06-17 ENCOUNTER — Encounter (INDEPENDENT_AMBULATORY_CARE_PROVIDER_SITE_OTHER): Payer: Self-pay | Admitting: Physician Assistant

## 2023-06-17 ENCOUNTER — Ambulatory Visit (INDEPENDENT_AMBULATORY_CARE_PROVIDER_SITE_OTHER): Admitting: Physician Assistant

## 2023-06-17 VITALS — BP 135/83 | HR 105 | Temp 97.9°F | Ht 63.0 in | Wt 247.0 lb

## 2023-06-17 DIAGNOSIS — R296 Repeated falls: Secondary | ICD-10-CM | POA: Diagnosis not present

## 2023-06-17 DIAGNOSIS — M25562 Pain in left knee: Secondary | ICD-10-CM

## 2023-06-17 DIAGNOSIS — M25561 Pain in right knee: Secondary | ICD-10-CM | POA: Diagnosis not present

## 2023-06-17 DIAGNOSIS — R7303 Prediabetes: Secondary | ICD-10-CM | POA: Diagnosis not present

## 2023-06-17 DIAGNOSIS — Z6841 Body Mass Index (BMI) 40.0 and over, adult: Secondary | ICD-10-CM | POA: Diagnosis not present

## 2023-06-17 DIAGNOSIS — F3189 Other bipolar disorder: Secondary | ICD-10-CM

## 2023-06-17 DIAGNOSIS — G8929 Other chronic pain: Secondary | ICD-10-CM

## 2023-06-17 NOTE — Progress Notes (Signed)
 SUBJECTIVE: Discussed the use of AI scribe software for clinical note transcription with the patient, who gave verbal consent to proceed.  Chief Complaint: Obesity  Interim History: She is down 3 lbs since her last visit. Down 40 lbs overall TBW loss of 13.93%  Brenda Hernandez is here to discuss her progress with her obesity treatment plan. She is on the Category 2 Plan and states she is following her eating plan approximately 50 % of the time. She states she is not exercising chair yoga 3-4 minutes 1-2 times per week.  Brenda Hernandez is a 48 year old female who presents for follow-up of her obesity treatment plan. She was referred by Dr. Cheron Schaumann to Dr. Arbutus Leas for a neurological evaluation due to recent falls and ataxia.  She has lost three pounds since her last visit and a total of forty pounds overall. She feels discouraged at times but remains motivated to continue her weight loss journey. She faces dietary challenges, such as getting tired of eating the same healthy foods and being tempted by less healthy options like stewed apples, grits with butter, and pancakes. Her current breakfast routine includes oatmeal and occasionally Frosted Flakes, while lunch and dinner often consist of a ham and sweet pea casserole or salad. Her mother helps moderate her portion sizes.  She has experienced increasing difficulty with falls and ataxia, which began after a severe fall where she hit her head. Since then, she has been using a cane for stability, except at her mother's house.  She has a history of prediabetes, vitamin D deficiency, and chronic knee pain. Her knee pain has worsened but is not described as severe. She tries to limit her activities to manage the pain and has not seen her orthopedic doctor, Dr. Ranell Patrick, recently. She previously received a cortisol injection in her knee, which did not provide relief. She is attempting chair yoga to improve her physical activity.  She has significant bipolar  affective disorder and is on multiple psychotropic medications. There is concern that her medications may be contributing to her falls and ataxia. No recent issues with her medications have been reported. OBJECTIVE: Visit Diagnoses: Problem List Items Addressed This Visit     Pre-diabetes - Primary   Bilateral chronic knee pain   Morbid obesity (HCC)   Other Visit Diagnoses       Multiple falls         BMI 40.0-44.9, adult (HCC) Current BMI 43.8          Obesity Brenda Hernandez is being followed for obesity management. She has lost 3 pounds since her last visit and 40 pounds overall. She is working towards weight loss goals by monitoring her diet, though she struggles with dietary monotony and occasional high-calorie indulgence. She is encouraged to focus on protein intake and portion control, especially during events like Easter. - Continue current dietary plan with focus on protein intake and portion control - Encourage use of protein-rich foods as alternatives to high-calorie options - Monitor weight and dietary habits   Falls and Ataxia Brenda Hernandez was seen by her PCP- Dr Posey Rea for  increasing falls and ataxia, likely related to psychotropic medications. She reports a significant fall with head injury and subsequent tremors while walking. She uses a cane for mobility, except at her mother's house. Neurological evaluation is pending. - Attend neurological evaluation with Dr. Arbutus Leas on July 06, 2023 - Consult with an ophthalmologist for a vision exam - Use a cane for mobility as needed  Bipolar Affective  Disorder Brenda Hernandez has significant bipolar affective disorder managed with multiple psychotropic medications. There is concern about medication-related side effects contributing to falls and ataxia. The neurological evaluation may provide insights into the need for medication adjustments. - Evaluate potential medication side effects during neurological assessment  Chronic Knee Pain Brenda Hernandez  reports worsening chronic knee pain, not severe. Previous corticosteroid injections were ineffective. She is advised to continue chair yoga and consider physical therapy to improve strength and balance, potentially alleviating knee pain. - Continue chair yoga exercises to improve strength and balance - Consider physical therapy after neurological evaluation if indicated.   Follow-up Brenda Hernandez has multiple upcoming appointments, including a neurological evaluation and regular follow-ups. She is advised to see the provider on Jul 27, 2023, for a follow-up visit. - Schedule follow-up appointment on Jul 27, 2023, at 2:00 PM  Vitals Temp: 97.9 F (36.6 C) BP: 135/83 Pulse Rate: (!) 105 SpO2: 100 %   Anthropometric Measurements Height: 5\' 3"  (1.6 m) Weight: 247 lb (112 kg) BMI (Calculated): 43.76 Weight at Last Visit: 250 lb Weight Lost Since Last Visit: 3 lb Weight Gained Since Last Visit: 0 Starting Weight: 287 lb Total Weight Loss (lbs): 40 lb (18.1 kg) Peak Weight: 303 lb   Body Composition  Body Fat %: 54.1 % Fat Mass (lbs): 133.8 lbs Muscle Mass (lbs): 107.6 lbs Visceral Fat Rating : 17   Other Clinical Data Fasting: No Labs: No Today's Visit #: 28 Starting Date: 03/26/20     ASSESSMENT AND PLAN:  Diet: Brenda Hernandez is currently in the action stage of change. As such, her goal is to continue with weight loss efforts. She has agreed to Category 2 Plan.  Exercise: Brenda Hernandez has been instructed to try a geriatric exercise plan and that some exercise is better than none for weight loss and overall health benefits.   Behavior Modification:  We discussed the following Behavioral Modification Strategies today: increasing lean protein intake, decreasing simple carbohydrates, increasing vegetables, increase H2O intake, increase high fiber foods, meal planning and cooking strategies, avoiding temptations, and planning for success. We discussed various medication options to help  Brenda Hernandez with her weight loss efforts and we both agreed to continue to work on nutritional and behavioral strategies to promote weight loss.  .  Return in about 6 weeks (around 07/29/2023).Marland Kitchen She was informed of the importance of frequent follow up visits to maximize her success with intensive lifestyle modifications for her multiple health conditions.  Attestation Statements:   Reviewed by clinician on day of visit: allergies, medications, problem list, medical history, surgical history, family history, social history, and previous encounter notes.   Time spent on visit including pre-visit chart review and post-visit care and charting was 30 minutes.    Irie Fiorello, PA-C

## 2023-06-23 NOTE — Progress Notes (Unsigned)
 Assessment/Plan:   1.  Tremor  - Patient did not have a lot of tremor on examination today.  My suspicion is that tremor is likely a consequence of Depakote.  Explained to her that this does not mean that she is toxic on Depakote, but it is a common consequence of being on the medication.  I did not recommend that it be discontinued.  2.  Gait instability  - This does not appear neurologic in nature and appears to be orthopedic, from knee and hip pain.  She is already seeing orthopedics and is hoping to get surgery, but was told that she needed to lose weight and she is working on that.  3.  Staring spells  - We will do EEG.  If negative, we will do ambulatory EEG.  If that is negative, no further workup will be needed if we are able to capture a spell.  May be epileptogenic, but suspicion for that is low given that she can talk and follow commands during the spell.  Could be behavioral/related to psychiatric illness as well, but above should be ruled out first.   Subjective:   Brenda Hernandez was seen today in the movement disorders clinic for neurologic consultation at the request of Plotnikov, Georgina Quint, MD.  Pt with mother who supplements hx.   The consultation is for the evaluation of gait instability, falls, tremor.  Outside records that were made available to me were reviewed.   Pt does have hx of tardive dyskinesia due to meds taken in past for bipolar d/o.  This was mouth movements and was dx about 10 years ago and she had that still about 4-5 years ago.  She had associated blepharospam.  She was on vitamin B6 to treat it.    Mom reports episodes of staring.  Her eyes will move to the ceiling but she can still talk and interact.  Its usually in the evening per mom but she also states that she works so she might not notice it in the day.  It lasts seconds.  She has 2 episodes per evening.     Specific Symptoms:  Tremor: Yes.  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  father with tremor but they think from DM; maternal uncle with Parkinsons Disease  Voice: no change Sleep: sleeps well  Vivid Dreams:  No.  Acting out dreams:  No. Wet Pillows: Yes.   Postural symptoms:  Yes.    Falls?  Yes.   But more near falls Bradykinesia symptoms: some trouble getting OOC due to L knee issues; drags feet; slower with ambulation Loss of smell:  No. Loss of taste:  No. Urinary Incontinence:  No. Difficulty Swallowing:  No. (She used to have that problem but it got better) Handwriting, micrographia: No. Trouble with ADL's:  No.  Trouble buttoning clothing: No. Depression:  No. Hallucinations:  No.  visual distortions: No. N/V:  No. Lightheaded:  No.  Syncope: No. Diplopia:  No. Prior exposure to reglan/antipsychotics: zyprexa; abilify; lithium; haldol  Neuroimaging of the brain has not previously been performed since CT brain in 2000.    ALLERGIES:   Allergies  Allergen  Reactions   Citric Acid    Diphenhydramine Other (See Comments)    CURRENT MEDICATIONS:  Current Meds  Medication Sig   aspirin EC 81 MG tablet Take 81 mg by mouth 2 (two) times daily. Swallow whole.   clomiPRAMINE (ANAFRANIL) 75 MG capsule Take 75 mg by mouth 2 (two) times daily.   cloZAPine (CLOZARIL) 100 MG tablet Take 5 tablets (500 mg total) by mouth daily.   cyanocobalamin (VITAMIN B12) 500 MCG tablet Take 1 tablet (500 mcg total) by mouth daily.   divalproex (DEPAKOTE ER) 500 MG 24 hr tablet Take 1 tablet (500 mg total) by mouth daily. (Patient taking differently: Take 2,000 mg by mouth daily. Take 4 po in the evening)   fluticasone (FLONASE) 50 MCG/ACT nasal spray Place 1 spray into both nostrils daily.   levonorgestrel-ethinyl  estradiol (ALESSE) 0.1-20 MG-MCG tablet Sronyx 0.1 mg-20 mcg tablet  TAKE 1 TABLET BY MOUTH EVERY DAY   metFORMIN (GLUCOPHAGE) 1000 MG tablet Take 1,000 mg by mouth 2 (two) times daily with a meal.     Objective:   VITALS:   Vitals:   06/24/23 1315  BP: 124/74  Pulse: 85  Weight: 249 lb 12.8 oz (113.3 kg)  Height: 5\' 4"  (1.626 m)    GEN:  The patient appears younger than stated age and is in NAD. HEENT:  Normocephalic, atraumatic.  The mucous membranes are moist. The superficial temporal arteries are without ropiness or tenderness. CV:  RRR Lungs:  CTAB Neck/HEME:  There are no carotid bruits bilaterally.  Neurological examination:  Orientation: The patient is alert and oriented x3.  Cranial nerves: There is good facial symmetry. Extraocular muscles are intact. The visual fields are full to confrontational testing. The speech is fluent and clear. Soft palate rises symmetrically and there is no tongue deviation. Hearing is intact to conversational tone. Sensation: Sensation is intact to light and pinprick throughout (facial, trunk, extremities). Vibration is intact at the bilateral big toe. There is no extinction with double simultaneous stimulation. There is no sensory dermatomal level identified. Motor: Strength is 5/5 in the bilateral upper and lower extremities.  She does complain about some pain with manual motor testing, but ultimately he is able to do it with encouragement.  Shoulder shrug is equal and symmetric.  There is no pronator drift. Deep tendon reflexes: Deep tendon reflexes are 2/4 at the bilateral biceps, triceps, brachioradialis, patella and achilles. Plantar responses are downgoing bilaterally.  Movement examination: Tone: There is nl tone in the bilateral upper extremities.  The tone in the lower extremities is nl.  Abnormal movements: There is no rest tremor.  No postural and intention tremor.  No significant trouble with Archimedes spirals. Coordination:  There  is no decremation with RAM's, with any form of RAMS, including alternating supination and pronation of the forearm, hand opening and closing, finger taps, heel taps and toe taps.  Gait and Station: The patient pushes off to arise.  She uses her cane to ambulate.  She is antalgic. I have reviewed and interpreted the following labs independently   Chemistry      Component Value Date/Time   NA 138 06/24/2023 1220   NA 139 06/17/2022 1436   K 4.0 06/24/2023 1220   CL 105 06/24/2023 1220   CO2 20 (L) 06/24/2023 1220   BUN 10 06/24/2023 1220   BUN 9 06/17/2022 1436   CREATININE 0.94 06/24/2023 1220      Component Value Date/Time   CALCIUM 8.6 (L) 06/24/2023  1220   ALKPHOS 53 06/24/2023 1220   AST 15 06/24/2023 1220   ALT 9 06/24/2023 1220   BILITOT 0.6 06/24/2023 1220   BILITOT 0.2 06/17/2022 1436      Lab Results  Component Value Date   TSH 3.920 04/30/2023   Lab Results  Component Value Date   WBC 5.4 06/24/2023   HGB 13.9 06/24/2023   HCT 40.6 06/24/2023   MCV 93.8 06/24/2023   PLT 200 06/24/2023     Total time spent on today's visit was 45 minutes, including both face-to-face time and nonface-to-face time.  Time included that spent on review of records (prior notes available to me/labs/imaging if pertinent), discussing treatment and goals, answering patient's questions and coordinating care.  Cc:  Plotnikov, Georgina Quint, MD

## 2023-06-24 ENCOUNTER — Encounter: Payer: Self-pay | Admitting: Neurology

## 2023-06-24 ENCOUNTER — Other Ambulatory Visit (HOSPITAL_COMMUNITY)
Admission: AD | Admit: 2023-06-24 | Discharge: 2023-06-24 | Disposition: A | Discharge status: Home / Self Care | Attending: Psychology | Admitting: Psychology

## 2023-06-24 ENCOUNTER — Ambulatory Visit (INDEPENDENT_AMBULATORY_CARE_PROVIDER_SITE_OTHER): Admitting: Neurology

## 2023-06-24 VITALS — BP 124/74 | HR 85 | Ht 64.0 in | Wt 249.8 lb

## 2023-06-24 DIAGNOSIS — R2681 Unsteadiness on feet: Secondary | ICD-10-CM

## 2023-06-24 DIAGNOSIS — G251 Drug-induced tremor: Secondary | ICD-10-CM | POA: Diagnosis not present

## 2023-06-24 DIAGNOSIS — Z79899 Other long term (current) drug therapy: Secondary | ICD-10-CM | POA: Insufficient documentation

## 2023-06-24 DIAGNOSIS — E559 Vitamin D deficiency, unspecified: Secondary | ICD-10-CM | POA: Insufficient documentation

## 2023-06-24 DIAGNOSIS — R4182 Altered mental status, unspecified: Secondary | ICD-10-CM | POA: Diagnosis not present

## 2023-06-24 LAB — VITAMIN D 25 HYDROXY (VIT D DEFICIENCY, FRACTURES): Vit D, 25-Hydroxy: 57.26 ng/mL (ref 30–100)

## 2023-06-24 LAB — CBC WITH DIFFERENTIAL/PLATELET
Abs Immature Granulocytes: 0.01 10*3/uL (ref 0.00–0.07)
Basophils Absolute: 0 10*3/uL (ref 0.0–0.1)
Basophils Relative: 0 %
Eosinophils Absolute: 0 10*3/uL (ref 0.0–0.5)
Eosinophils Relative: 0 %
HCT: 40.6 % (ref 36.0–46.0)
Hemoglobin: 13.9 g/dL (ref 12.0–15.0)
Immature Granulocytes: 0 %
Lymphocytes Relative: 55 %
Lymphs Abs: 3 10*3/uL (ref 0.7–4.0)
MCH: 32.1 pg (ref 26.0–34.0)
MCHC: 34.2 g/dL (ref 30.0–36.0)
MCV: 93.8 fL (ref 80.0–100.0)
Monocytes Absolute: 0.3 10*3/uL (ref 0.1–1.0)
Monocytes Relative: 5 %
Neutro Abs: 2.1 10*3/uL (ref 1.7–7.7)
Neutrophils Relative %: 40 %
Platelets: 200 10*3/uL (ref 150–400)
RBC: 4.33 MIL/uL (ref 3.87–5.11)
RDW: 15.9 % — ABNORMAL HIGH (ref 11.5–15.5)
WBC: 5.4 10*3/uL (ref 4.0–10.5)
nRBC: 0 % (ref 0.0–0.2)

## 2023-06-24 LAB — COMPREHENSIVE METABOLIC PANEL WITH GFR
ALT: 9 U/L (ref 0–44)
AST: 15 U/L (ref 15–41)
Albumin: 3 g/dL — ABNORMAL LOW (ref 3.5–5.0)
Alkaline Phosphatase: 53 U/L (ref 38–126)
Anion gap: 13 (ref 5–15)
BUN: 10 mg/dL (ref 6–20)
CO2: 20 mmol/L — ABNORMAL LOW (ref 22–32)
Calcium: 8.6 mg/dL — ABNORMAL LOW (ref 8.9–10.3)
Chloride: 105 mmol/L (ref 98–111)
Creatinine, Ser: 0.94 mg/dL (ref 0.44–1.00)
GFR, Estimated: >60 mL/min (ref >60)
Glucose, Bld: 98 mg/dL (ref 70–99)
Potassium: 4 mmol/L (ref 3.5–5.1)
Sodium: 138 mmol/L (ref 135–145)
Total Bilirubin: 0.6 mg/dL (ref 0.0–1.2)
Total Protein: 6.6 g/dL (ref 6.5–8.1)

## 2023-06-24 LAB — VITAMIN B12: Vitamin B-12: 1135 pg/mL — ABNORMAL HIGH (ref 180–914)

## 2023-06-24 LAB — HEMOGLOBIN A1C
Hgb A1c MFr Bld: 5.1 % (ref 4.8–5.6)
Mean Plasma Glucose: 99.67 mg/dL

## 2023-06-24 LAB — TSH: TSH: 2.467 u[IU]/mL (ref 0.350–4.500)

## 2023-06-24 NOTE — Patient Instructions (Signed)
 We will need to schedule the EEG.  If that is negative, we will schedule an ambulatory EEG that you wear home for 48 hours.  The physicians and staff at [facility] are committed to providing excellent care. You may receive a survey requesting feedback about your experience at our office. We strive to receive "very good" responses to the survey questions. If you feel that your experience would prevent you from giving the office a "very good " response, please contact our office to try to remedy the situation. We may be reached at [phone]. Thank you for taking the time out of your busy day to complete the survey.

## 2023-06-28 ENCOUNTER — Ambulatory Visit (INDEPENDENT_AMBULATORY_CARE_PROVIDER_SITE_OTHER): Admitting: Neurology

## 2023-06-28 DIAGNOSIS — R404 Transient alteration of awareness: Secondary | ICD-10-CM

## 2023-06-28 NOTE — Progress Notes (Unsigned)
 EEG complete and ready for review.

## 2023-06-30 NOTE — Procedures (Signed)
 TECHNICAL SUMMARY:  A multichannel referential and bipolar montage EEG using the standard international 10-20 system was performed on the patient described as awake.  The dominant background activity consists of 7 to 8 hertz activity seen most prominantly over the posterior head region.  The backgound activity is reactive to eye opening and closing procedures.  Low voltage fast (beta) activity is distributed symmetrically and maximally over the anterior head regions.  ACTIVATION:  Stepwise photic stimulation at 4-20 flashes per second was performed and did not elicit any abnormal waveforms.  Hyperventilation was performed for 3 minutes with good patient effort and produced no changes in the background activity.  EPILEPTIFORM ACTIVITY:  There were no spikes, sharp waves or paroxysmal activity.  SLEEP: No sleep is noted.  CARDIAC:  The EKG lead revealed a tachycardic rhythm  IMPRESSION:  This EEG demonstrated no focal, hemispheric, or lateralizing features.  No epileptiform activity was recorded.  There was slowing of electrocerebral activity, overall quite mild.  This can be seen in a wide variety of encephalopathic state including those of a toxic, metabolic, degenerative nature or due to medication effect.  Correlate clinically.

## 2023-07-01 ENCOUNTER — Ambulatory Visit: Admitting: Neurology

## 2023-07-01 DIAGNOSIS — F319 Bipolar disorder, unspecified: Secondary | ICD-10-CM | POA: Diagnosis not present

## 2023-07-01 DIAGNOSIS — F3189 Other bipolar disorder: Secondary | ICD-10-CM | POA: Diagnosis not present

## 2023-07-06 ENCOUNTER — Ambulatory Visit: Admitting: Neurology

## 2023-07-13 DIAGNOSIS — F319 Bipolar disorder, unspecified: Secondary | ICD-10-CM | POA: Diagnosis not present

## 2023-07-13 DIAGNOSIS — F3189 Other bipolar disorder: Secondary | ICD-10-CM | POA: Diagnosis not present

## 2023-07-19 ENCOUNTER — Other Ambulatory Visit (INDEPENDENT_AMBULATORY_CARE_PROVIDER_SITE_OTHER): Payer: Self-pay | Admitting: Physician Assistant

## 2023-07-19 DIAGNOSIS — E559 Vitamin D deficiency, unspecified: Secondary | ICD-10-CM

## 2023-07-26 NOTE — Progress Notes (Unsigned)
 SUBJECTIVE: Discussed the use of AI scribe software for clinical note transcription with the patient, who gave verbal consent to proceed.  Chief Complaint: Obesity  Interim History: She is down 4 lbs since last visit.  Down 44 lbs overall TBW loss of 15.33%  Brenda Hernandez is here to discuss her progress with her obesity treatment plan. She is on the Category 2 Plan and states she is following her eating plan approximately 50 % of the time. She states she not exercising.  Brenda Hernandez is a 48 year old female who presents for follow-up of her obesity treatment plan.  She has successfully lost 44 pounds, reducing her weight from 287 pounds to 243 pounds, with a target weight of 225 pounds to qualify for knee surgery. She is more mindful of her eating habits, reducing sweets, and controlling portion sizes, even when indulging in treats like ice cream. Her BMI is now 43.  She has a history of prediabetes, with her most recent A1c at 5.1, improved from previous levels of 5.6.   She experiences chronic bilateral knee pain, which has worsened with recent falls. She has anxiety about navigating steps due to fear of falling. Her knee pain is a significant barrier to mobility and contributes to her falls.  She has a history of vitamin D  and B12 deficiencies. Her vitamin D  level is currently stable at 57. She was previously on a daily vitamin B12 supplement, which has now been reduced to twice a week due to elevated levels.  She reports experiencing tremors, which were attributed to her psychotropic medication, Depakote . She also experiences staring spells, but these are not associated with seizures as she remains responsive during them.  She has a history of low calcium levels, despite consuming dairy products like cheese and milk. OBJECTIVE: Visit Diagnoses: Problem List Items Addressed This Visit     Pre-diabetes - Primary   Vitamin D  deficiency   Low serum vitamin B12   Bilateral chronic knee  pain   Morbid obesity (HCC)   Other Visit Diagnoses       Occasional tremors         BMI 40.0-44.9, adult (HCC)Current BMI 43.1         Bilateral chronic knee pain/OA Chronic bilateral knee pain exacerbated by recent falls, impacting mobility and causing anxiety about stairs. Weight loss is expected to alleviate stress on the knees. She is motivated to reach target weight for potential knee surgery. - Continue weight loss efforts to reduce stress on knees. - Consider referral for orthopedic evaluation if pain persists after reaching target weight.  Obesity Obesity with a significant weight loss of 44 pounds, now weighing 243 pounds. BMI is 43. Target weight is 225 pounds to qualify for knee surgery. She is making mindful dietary choices, reducing sweets, and controlling portion sizes. Encouraged by family support and personal motivation. Achieved a 15.33% reduction in body weight without medications like Ozempic or Mounjaro. - Continue current weight loss plan with mindful eating and portion control. - Encourage continued family support and motivation. - Reassess weight and BMI at next visit.  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 Neurology evaluation suggests low probability of seizure activity. EEG planned to rule out seizures due to staring spells, but neurologist considers it unlikely. - Proceed with EEG to rule out seizure activity. - Continue current psychotropic medication regimen as it is effective for mental health.  Prediabetes Prediabetes with significant improvement. Current A1c is 5.1, reduced from 5.6.  Weight loss and dietary changes have improved glycemic control, reducing the risk of type 2 diabetes. On metformin 1000 mg BID. No GI or other SE with metformin.  - Continue current dietary and lifestyle modifications to maintain glycemic control.  Vitamin B12 deficiency Vitamin B12 levels previously low, now elevated. Current supplementation  is excessive. Plan to reduce supplementation to avoid unnecessary intake and cost. - Decrease vitamin B12 supplementation to twice a week (Monday and Friday).  Vitamin D  deficiency Vitamin D  levels are adequate at 57. On Ergocalciferol  50,000 units once weekly. No N/V or muscle weakness with Ergocalciferol  Low vitamin D  levels can be associated with adiposity and may result in leptin resistance and weight gain. Also associated with fatigue.  Currently on vitamin D  supplementation without any adverse effects such as nausea, vomiting or muscle weakness.  Continue ergocalciferol  once weekly  Vitals Temp: 98.1 F (36.7 C) BP: 130/89 Pulse Rate: (!) 101 SpO2: 97 %   Anthropometric Measurements Height: 5\' 3"  (1.6 m) Weight: 243 lb (110.2 kg) BMI (Calculated): 43.06 Weight at Last Visit: 247 lb Weight Lost Since Last Visit: 4 lb Weight Gained Since Last Visit: 0 Starting Weight: 287 lb Total Weight Loss (lbs): 44 lb (20 kg) Peak Weight: 303 lb   Body Composition  Body Fat %: 54.2 % Fat Mass (lbs): 131.8 lbs Muscle Mass (lbs): 105.8 lbs Visceral Fat Rating : 16   Other Clinical Data Fasting: no Labs: no Today's Visit #: 13 Starting Date: 03/26/20     ASSESSMENT AND PLAN:  Diet: Brenda Hernandez is currently in the action stage of change. As such, her goal is to continue with weight loss efforts. She has agreed to Category 2 Plan.  Exercise: Brenda Hernandez has been instructed to continue exercising as is for weight loss and overall health benefits.   Behavior Modification:  We discussed the following Behavioral Modification Strategies today: increasing lean protein intake, decreasing simple carbohydrates, increasing vegetables, increase H2O intake, increase high fiber foods, meal planning and cooking strategies, avoiding temptations, and planning for success. We discussed various medication options to help Brenda Hernandez with her weight loss efforts and we both agreed to continue current  treatment plan, continue to work on nutritional and behavioral strategies to promote weight loss.  .  Return in about 6 weeks (around 09/07/2023).Brenda Hernandez She was informed of the importance of frequent follow up visits to maximize her success with intensive lifestyle modifications for her multiple health conditions.  Attestation Statements:   Reviewed by clinician on day of visit: allergies, medications, problem list, medical history, surgical history, family history, social history, and previous encounter notes.   Time spent on visit including pre-visit chart review and post-visit care and charting was 29 minutes.    Deoni Cosey, PA-C

## 2023-07-27 ENCOUNTER — Ambulatory Visit (INDEPENDENT_AMBULATORY_CARE_PROVIDER_SITE_OTHER): Admitting: Physician Assistant

## 2023-07-27 ENCOUNTER — Encounter (INDEPENDENT_AMBULATORY_CARE_PROVIDER_SITE_OTHER): Payer: Self-pay | Admitting: Physician Assistant

## 2023-07-27 VITALS — BP 130/89 | HR 101 | Temp 98.1°F | Ht 63.0 in | Wt 243.0 lb

## 2023-07-27 DIAGNOSIS — M25562 Pain in left knee: Secondary | ICD-10-CM | POA: Diagnosis not present

## 2023-07-27 DIAGNOSIS — Z6841 Body Mass Index (BMI) 40.0 and over, adult: Secondary | ICD-10-CM | POA: Diagnosis not present

## 2023-07-27 DIAGNOSIS — R7303 Prediabetes: Secondary | ICD-10-CM

## 2023-07-27 DIAGNOSIS — R251 Tremor, unspecified: Secondary | ICD-10-CM | POA: Diagnosis not present

## 2023-07-27 DIAGNOSIS — E559 Vitamin D deficiency, unspecified: Secondary | ICD-10-CM | POA: Diagnosis not present

## 2023-07-27 DIAGNOSIS — R632 Polyphagia: Secondary | ICD-10-CM

## 2023-07-27 DIAGNOSIS — E538 Deficiency of other specified B group vitamins: Secondary | ICD-10-CM | POA: Diagnosis not present

## 2023-07-27 DIAGNOSIS — G8929 Other chronic pain: Secondary | ICD-10-CM

## 2023-07-27 DIAGNOSIS — M25561 Pain in right knee: Secondary | ICD-10-CM

## 2023-07-30 ENCOUNTER — Other Ambulatory Visit (HOSPITAL_COMMUNITY)
Admission: AD | Admit: 2023-07-30 | Discharge: 2023-07-30 | Disposition: A | Discharge status: Home / Self Care | Attending: Psychology | Admitting: Psychology

## 2023-07-30 DIAGNOSIS — Z79899 Other long term (current) drug therapy: Secondary | ICD-10-CM | POA: Diagnosis not present

## 2023-07-30 LAB — CBC WITH DIFFERENTIAL/PLATELET
Abs Immature Granulocytes: 0.03 10*3/uL (ref 0.00–0.07)
Basophils Absolute: 0 10*3/uL (ref 0.0–0.1)
Basophils Relative: 0 %
Eosinophils Absolute: 0.1 10*3/uL (ref 0.0–0.5)
Eosinophils Relative: 1 %
HCT: 39.2 % (ref 36.0–46.0)
Hemoglobin: 14 g/dL (ref 12.0–15.0)
Immature Granulocytes: 0 %
Lymphocytes Relative: 62 %
Lymphs Abs: 4.1 10*3/uL — ABNORMAL HIGH (ref 0.7–4.0)
MCH: 33.1 pg (ref 26.0–34.0)
MCHC: 35.7 g/dL (ref 30.0–36.0)
MCV: 92.7 fL (ref 80.0–100.0)
Monocytes Absolute: 0.4 10*3/uL (ref 0.1–1.0)
Monocytes Relative: 6 %
Neutro Abs: 2.1 10*3/uL (ref 1.7–7.7)
Neutrophils Relative %: 31 %
Platelets: 260 10*3/uL (ref 150–400)
RBC: 4.23 MIL/uL (ref 3.87–5.11)
RDW: 15.4 % (ref 11.5–15.5)
WBC: 6.7 10*3/uL (ref 4.0–10.5)
nRBC: 0 % (ref 0.0–0.2)

## 2023-08-05 DIAGNOSIS — F3189 Other bipolar disorder: Secondary | ICD-10-CM | POA: Diagnosis not present

## 2023-08-17 ENCOUNTER — Telehealth: Payer: Self-pay | Admitting: Internal Medicine

## 2023-08-17 ENCOUNTER — Ambulatory Visit (INDEPENDENT_AMBULATORY_CARE_PROVIDER_SITE_OTHER): Admitting: Internal Medicine

## 2023-08-17 ENCOUNTER — Encounter: Payer: Self-pay | Admitting: Internal Medicine

## 2023-08-17 VITALS — BP 110/70 | HR 97 | Temp 97.7°F | Ht 63.0 in | Wt 235.0 lb

## 2023-08-17 DIAGNOSIS — F258 Other schizoaffective disorders: Secondary | ICD-10-CM

## 2023-08-17 DIAGNOSIS — F3160 Bipolar disorder, current episode mixed, unspecified: Secondary | ICD-10-CM | POA: Diagnosis not present

## 2023-08-17 DIAGNOSIS — R251 Tremor, unspecified: Secondary | ICD-10-CM | POA: Diagnosis not present

## 2023-08-17 DIAGNOSIS — W19XXXA Unspecified fall, initial encounter: Secondary | ICD-10-CM | POA: Insufficient documentation

## 2023-08-17 DIAGNOSIS — W19XXXS Unspecified fall, sequela: Secondary | ICD-10-CM | POA: Diagnosis not present

## 2023-08-17 MED ORDER — DIVALPROEX SODIUM ER 500 MG PO TB24: 2000.0000 mg | ORAL_TABLET | Freq: Every day | ORAL | Status: AC

## 2023-08-17 NOTE — Assessment & Plan Note (Signed)
 On Depakote  XR 500 mg - pt states she is taking 4 a day F/u w/Britney Brenita Callow, NP

## 2023-08-17 NOTE — Assessment & Plan Note (Signed)
 Tremors, ataxia, fall On Depakote  XR 500 mg - pt states she is taking 4 a day. Pt is reluctant to reduce the dose F/u w/Britney Brenita Callow, NP

## 2023-08-17 NOTE — Assessment & Plan Note (Signed)
 We re-adjusted the cane hight - it was too tall for [employer]

## 2023-08-17 NOTE — Telephone Encounter (Signed)
 Copied from CRM [id number]. Topic: Clinical - Medication Question >> Aug 17, 2023  3:14 PM Allyne Areola wrote: Reason for CRM: Patient was seen today and asked to give a call back regarding some information that was needed on divalproex  (DEPAKOTE  ER) 500 MG 24 hr tablet prescription. She wants to let Dr.Plotnikov know that she takes 4 500 MG tablets a day.

## 2023-08-17 NOTE — Progress Notes (Signed)
 Subjective:  Patient ID: Brenda Hernandez, female    DOB: 08-18-1975  Age: 48 y.o. MRN: [id number]  CC: Medical Management of Chronic Issues (Discuss B12 levels and frequency... Pt is also having tremors and shaking.)   HPI Danyelle Woodle presents for Knee OA, low B12, poor balance On Depakote  XR 500 mg - pt states she is taking 4 a day Here w/mom  Outpatient Medications Prior to Visit  Medication Sig Dispense Refill   aspirin EC 81 MG tablet Take 81 mg by mouth 2 (two) times daily. Swallow whole.     clomiPRAMINE (ANAFRANIL) 75 MG capsule Take 75 mg by mouth 2 (two) times daily.     cloZAPine  (CLOZARIL ) 100 MG tablet Take 5 tablets (500 mg total) by mouth daily. 150 tablet 2   cyanocobalamin  (VITAMIN B12) 500 MCG tablet Take 1 tablet (500 mcg total) by mouth daily. 90 tablet 0   ergocalciferol  (VITAMIN D2) 1.25 MG (50000 UT) capsule Take 50,000 Units by mouth once a week.     fluticasone  (FLONASE ) 50 MCG/ACT nasal spray Place 1 spray into both nostrils daily. 16 g 6   levonorgestrel -ethinyl estradiol (ALESSE) 0.1-20 MG-MCG tablet Sronyx 0.1 mg-20 mcg tablet  TAKE 1 TABLET BY MOUTH EVERY DAY     metFORMIN (GLUCOPHAGE) 1000 MG tablet Take 1,000 mg by mouth 2 (two) times daily with a meal.     divalproex  (DEPAKOTE  ER) 500 MG 24 hr tablet Take 1 tablet (500 mg total) by mouth daily. (Patient taking differently: Take 2,000 mg by mouth daily. Take 4 po in the evening) 30 tablet 5   No facility-administered medications prior to visit.    ROS: Review of Systems  Constitutional:  Negative for activity change, appetite change, chills, fatigue and unexpected weight change.  HENT:  Negative for congestion, mouth sores and sinus pressure.   Eyes:  Negative for visual disturbance.  Respiratory:  Negative for cough and chest tightness.   Gastrointestinal:  Negative for abdominal pain and nausea.  Genitourinary:  Negative for difficulty urinating, frequency and vaginal pain.  Musculoskeletal:   Positive for gait problem. Negative for arthralgias and back pain.  Skin:  Negative for pallor and rash.  Neurological:  Positive for tremors. Negative for dizziness, weakness, numbness and headaches.  Psychiatric/Behavioral:  Negative for confusion, sleep disturbance and suicidal ideas. The patient is not nervous/anxious.     Objective:  BP 110/70   Pulse 97   Temp 97.7 F (36.5 C) (Oral)   Ht 5\' 3"  (1.6 m)   Wt 235 lb (106.6 kg)   LMP 07/20/2023   SpO2 98%   BMI 41.63 kg/m   BP Readings from Last 3 Encounters:  08/17/23 110/70  07/27/23 130/89  06/24/23 124/74    Wt Readings from Last 3 Encounters:  08/17/23 235 lb (106.6 kg)  07/27/23 243 lb (110.2 kg)  06/24/23 249 lb 12.8 oz (113.3 kg)    Physical Exam Constitutional:      General: She is not in acute distress.    Appearance: She is well-developed. She is obese.  HENT:     Head: Normocephalic.     Right Ear: External ear normal.     Left Ear: External ear normal.     Nose: Nose normal.  Eyes:     General:        Right eye: No discharge.        Left eye: No discharge.     Conjunctiva/sclera: Conjunctivae normal.     Pupils: Pupils  are equal, round, and reactive to light.  Neck:     Thyroid : No thyromegaly.     Vascular: No JVD.     Trachea: No tracheal deviation.  Cardiovascular:     Rate and Rhythm: Normal rate and regular rhythm.     Heart sounds: Normal heart sounds.  Pulmonary:     Effort: No respiratory distress.     Breath sounds: No stridor. No wheezing or rales.  Abdominal:     General: Bowel sounds are normal. There is no distension.     Palpations: Abdomen is soft. There is no mass.     Tenderness: There is no abdominal tenderness. There is no guarding or rebound.  Musculoskeletal:        General: Tenderness present.     Cervical back: Normal range of motion and neck supple. No rigidity.     Right lower leg: No edema.     Left lower leg: No edema.  Lymphadenopathy:     Cervical: No  cervical adenopathy.  Skin:    Findings: No erythema or rash.  Neurological:     Mental Status: She is oriented to person, place, and time.     Cranial Nerves: No cranial nerve deficit.     Motor: No abnormal muscle tone.     Coordination: Coordination normal.     Deep Tendon Reflexes: Reflexes normal.  Psychiatric:        Behavior: Behavior normal.        Thought Content: Thought content normal.        Judgment: Judgment normal.   We re-adjusted the cane hight - it was too tall for [employer]  Lab Results  Component Value Date   WBC 6.7 07/30/2023   HGB 14.0 07/30/2023   HCT 39.2 07/30/2023   PLT 260 07/30/2023   GLUCOSE 98 06/24/2023   CHOL 185 04/30/2023   TRIG 125 04/30/2023   HDL 57 04/30/2023   LDLCALC 103 (H) 04/30/2023   ALT 9 06/24/2023   AST 15 06/24/2023   NA 138 06/24/2023   K 4.0 06/24/2023   CL 105 06/24/2023   CREATININE 0.94 06/24/2023   BUN 10 06/24/2023   CO2 20 (L) 06/24/2023   TSH 2.467 06/24/2023   HGBA1C 5.1 06/24/2023    No results found.  Assessment & Plan:   Problem List Items Addressed This Visit     BIPOLAR AFFECTIVE DISORDER   On Depakote  XR 500 mg - pt states she is taking 4 a day F/u w/Britney Brenita Callow, NP       Schizoaffective disorder (HCC) - Primary   On Depakote  XR 500 mg - pt states she is taking 4 a day F/u w/Britney Brenita Callow, NP       Tremor   Tremors, ataxia, fall On Depakote  XR 500 mg - pt states she is taking 4 a day. Pt is reluctant to reduce the dose F/u w/Britney Brenita Callow, NP       Fall   We re-adjusted the cane hight - it was too tall for [employer]         Meds ordered this encounter  Medications   divalproex  (DEPAKOTE  ER) 500 MG 24 hr tablet    Sig: Take 4 tablets (2,000 mg total) by mouth daily. Take 4 po in the evening      Follow-up: Return in about 3 months (around 11/17/2023) for a follow-up visit.  Anitra Barn, MD

## 2023-08-24 NOTE — Telephone Encounter (Signed)
 Noted! Thank you

## 2023-08-26 DIAGNOSIS — F3189 Other bipolar disorder: Secondary | ICD-10-CM | POA: Diagnosis not present

## 2023-09-03 ENCOUNTER — Other Ambulatory Visit (HOSPITAL_COMMUNITY)
Admission: AD | Admit: 2023-09-03 | Discharge: 2023-09-03 | Disposition: A | Discharge status: Home / Self Care | Attending: Psychology | Admitting: Psychology

## 2023-09-03 DIAGNOSIS — F319 Bipolar disorder, unspecified: Secondary | ICD-10-CM | POA: Insufficient documentation

## 2023-09-03 DIAGNOSIS — Z79899 Other long term (current) drug therapy: Secondary | ICD-10-CM | POA: Insufficient documentation

## 2023-09-03 LAB — CBC WITH DIFFERENTIAL/PLATELET
Abs Immature Granulocytes: 0.01 10*3/uL (ref 0.00–0.07)
Basophils Absolute: 0 10*3/uL (ref 0.0–0.1)
Basophils Relative: 0 %
Eosinophils Absolute: 0 10*3/uL (ref 0.0–0.5)
Eosinophils Relative: 0 %
HCT: 41.3 % (ref 36.0–46.0)
Hemoglobin: 13.7 g/dL (ref 12.0–15.0)
Immature Granulocytes: 0 %
Lymphocytes Relative: 52 %
Lymphs Abs: 3 10*3/uL (ref 0.7–4.0)
MCH: 31.9 pg (ref 26.0–34.0)
MCHC: 33.2 g/dL (ref 30.0–36.0)
MCV: 96.3 fL (ref 80.0–100.0)
Monocytes Absolute: 0.4 10*3/uL (ref 0.1–1.0)
Monocytes Relative: 8 %
Neutro Abs: 2.2 10*3/uL (ref 1.7–7.7)
Neutrophils Relative %: 40 %
Platelets: 237 10*3/uL (ref 150–400)
RBC: 4.29 MIL/uL (ref 3.87–5.11)
RDW: 15.4 % (ref 11.5–15.5)
WBC: 5.6 10*3/uL (ref 4.0–10.5)
nRBC: 0 % (ref 0.0–0.2)

## 2023-09-07 ENCOUNTER — Encounter (INDEPENDENT_AMBULATORY_CARE_PROVIDER_SITE_OTHER): Payer: Self-pay | Admitting: Physician Assistant

## 2023-09-07 ENCOUNTER — Ambulatory Visit (INDEPENDENT_AMBULATORY_CARE_PROVIDER_SITE_OTHER): Admitting: Physician Assistant

## 2023-09-07 VITALS — BP 99/69 | HR 102 | Temp 97.8°F | Ht 63.0 in | Wt 249.0 lb

## 2023-09-07 DIAGNOSIS — E559 Vitamin D deficiency, unspecified: Secondary | ICD-10-CM | POA: Diagnosis not present

## 2023-09-07 DIAGNOSIS — Z6841 Body Mass Index (BMI) 40.0 and over, adult: Secondary | ICD-10-CM | POA: Diagnosis not present

## 2023-09-07 DIAGNOSIS — E538 Deficiency of other specified B group vitamins: Secondary | ICD-10-CM | POA: Diagnosis not present

## 2023-09-07 DIAGNOSIS — R7303 Prediabetes: Secondary | ICD-10-CM | POA: Diagnosis not present

## 2023-09-07 NOTE — Progress Notes (Signed)
 SUBJECTIVE: Discussed the use of AI scribe software for clinical note transcription with the patient, who gave verbal consent to proceed.  Chief Complaint: Obesity  Interim History: She is up 6 lbs since her last visit.   Brenda Hernandez is here to discuss her progress with her obesity treatment plan. She is on the Category 2 Plan and states she is following her eating plan approximately 50 % of the time. She states she is exercising on stationary bike 20 minutes 1-2 times per week.  Brenda Hernandez is a 48 year old female who presents for follow-up of her obesity treatment plan.  She has experienced a recent weight gain of 6 lbs.  She reports the only recent medication change was for a new OCP- Vienva  Despite efforts to exercise more, her dietary habits, including high-sugar cereals and increased portion sizes, may be contributing to weight gain.  She typically consumes cereals like Frosted Flakes and Froot Loops for breakfast, sometimes opting for oatmeal with maple brown sugar. Lunch often includes cabbage with bacon or sausage, and dinners usually feature vegetables such as cabbage, corn, or broccoli. She consumes Outshine bars, usually limiting to one a day, but occasionally eats more when hungry.  She has a history of prediabetes, vitamin D  deficiency, vitamin B12 deficiency, and bilateral chronic knee pain. Her vitamin B12 supplementation has been reduced to twice a week due to elevated levels, and she takes vitamin D  every two weeks. She recently started a new birth control medication, Bienva, and notes that her appetite may have increased since starting this medication.  Her chronic knee pain persists, and she usually uses a cane for mobility. Biking does not exacerbate her knee pain, as it is non-weight bearing, allowing her to continue exercising despite the pain.  In terms of social history, she mentions the current hot weather and its impact on her daily activities. She also discusses  her dietary habits, including a preference for ranch dressing on salads and a tendency to use too much dressing, which she feels defeats the purpose of eating salads.  OBJECTIVE: Visit Diagnoses: Problem List Items Addressed This Visit     Pre-diabetes - Primary   Vitamin D  deficiency   Low serum vitamin B12   Morbid obesity (HCC)   Prediabetes Last A1c was 5.1- stable/at goal. Insulin  9.9- improved and nearing goal of <5  Medication(s): metformin 1000 mg BIDReports no GI or other side effects with metformin Polyphagia:Yes Lab Results  Component Value Date   HGBA1C 5.1 06/24/2023   HGBA1C 5.0 04/30/2023   HGBA1C 5.5 03/05/2023   HGBA1C 5.4 01/02/2023   HGBA1C 5.6 06/17/2022   Lab Results  Component Value Date   INSULIN  9.9 06/17/2022   INSULIN  14.1 06/18/2021   INSULIN  6.0 12/05/2020   INSULIN  10.8 07/18/2020   INSULIN  24.7 03/26/2020    Plan: Continue metformin 1000 mg BID per other prescriber We discussed/provided better breakfast options to avoid high sugar cereals .  Continue working on nutrition plan to decrease simple carbohydrates, increase lean proteins and exercise to promote weight loss, improve glycemic control and prevent progression to Type 2 diabetes.   Vitamin D  Deficiency Vitamin D  is at goal of 50.  Most recent vitamin D  level was 57.26. She is on  prescription ergocalciferol  50,000 IU every 14 days. No N/V or muscle weakness with Ergocalciferol   Other provider is prescribing Ergocalciferol  Lab Results  Component Value Date   VD25OH 57.26 06/24/2023   VD25OH 80.08 04/30/2023   VD25OH 71.90  03/05/2023    Plan: Continue  prescription ergocalciferol  50,000 IU every 14 days Low vitamin D  levels can be associated with adiposity and may result in leptin resistance and weight gain. Also associated with fatigue.  Currently on vitamin D  supplementation without any adverse effects such as nausea, vomiting or muscle weakness.    Low B 12 levels B 12 levels  have improved and she is taking B 12 twice weekly now.  Lab Results  Component Value Date   VITAMINB12 1,135 (H) 06/24/2023   Plan: Continue B 12 supplement twice weekly.  Monitor level periodically.   Vitals Temp: 97.8 F (36.6 C) BP: 99/69 Pulse Rate: (!) 102 SpO2: 94 %   Anthropometric Measurements Height: 5' 3 (1.6 m) Weight: 249 lb (112.9 kg) BMI (Calculated): 44.12 Weight at Last Visit: 243 lb Weight Lost Since Last Visit: 0 Weight Gained Since Last Visit: 6 lb Starting Weight: 287 lb Total Weight Loss (lbs): 38 lb (17.2 kg) Peak Weight: 303 lb   Body Composition  Body Fat %: 57.3 % Fat Mass (lbs): 143.2 lbs Muscle Mass (lbs): 101.2 lbs Visceral Fat Rating : 18   Other Clinical Data Fasting: yes Labs: no Today's Visit #: 48 Starting Date: 03/26/20     ASSESSMENT AND PLAN:  Diet: Brenda Hernandez is currently in the action stage of change. As such, her goal is to continue with weight loss efforts. She has agreed to Category 2 Plan.  Exercise: Brenda Hernandez has been instructed to continue exercising as is for weight loss and overall health benefits.   Behavior Modification:  We discussed the following Behavioral Modification Strategies today: increasing lean protein intake, decreasing simple carbohydrates, increasing vegetables, increase H2O intake, increase high fiber foods, no skipping meals, meal planning and cooking strategies, better snacking choices, avoiding temptations, and planning for success. We discussed various medication options to help Brenda Hernandez with her weight loss efforts and we both agreed to continue current treatment plan. Discussed using scale to weigh proteins and provided new scale and showed how to use.  Return in about 7 weeks (around 10/26/2023).Brenda Hernandez She was informed of the importance of frequent follow up visits to maximize her success with intensive lifestyle modifications for her multiple health conditions.  Attestation Statements:   Reviewed  by clinician on day of visit: allergies, medications, problem list, medical history, surgical history, family history, social history, and previous encounter notes.   Time spent on visit including pre-visit chart review and post-visit care and charting was 35 minutes.    Athel Merriweather, PA-C

## 2023-09-23 DIAGNOSIS — F319 Bipolar disorder, unspecified: Secondary | ICD-10-CM | POA: Diagnosis not present

## 2023-09-23 DIAGNOSIS — F3189 Other bipolar disorder: Secondary | ICD-10-CM | POA: Diagnosis not present

## 2023-10-01 ENCOUNTER — Other Ambulatory Visit (HOSPITAL_COMMUNITY)
Admission: AD | Admit: 2023-10-01 | Discharge: 2023-10-01 | Disposition: A | Discharge status: Home / Self Care | Attending: Psychology | Admitting: Psychology

## 2023-10-01 DIAGNOSIS — Z79899 Other long term (current) drug therapy: Secondary | ICD-10-CM | POA: Insufficient documentation

## 2023-10-01 LAB — CBC WITH DIFFERENTIAL/PLATELET
Abs Immature Granulocytes: 0.03 K/uL (ref 0.00–0.07)
Basophils Absolute: 0 K/uL (ref 0.0–0.1)
Basophils Relative: 0 %
Eosinophils Absolute: 0 K/uL (ref 0.0–0.5)
Eosinophils Relative: 0 %
HCT: 37.8 % (ref 36.0–46.0)
Hemoglobin: 13 g/dL (ref 12.0–15.0)
Immature Granulocytes: 0 %
Lymphocytes Relative: 56 %
Lymphs Abs: 4 K/uL (ref 0.7–4.0)
MCH: 32.6 pg (ref 26.0–34.0)
MCHC: 34.4 g/dL (ref 30.0–36.0)
MCV: 94.7 fL (ref 80.0–100.0)
Monocytes Absolute: 0.4 K/uL (ref 0.1–1.0)
Monocytes Relative: 6 %
Neutro Abs: 2.7 K/uL (ref 1.7–7.7)
Neutrophils Relative %: 38 %
Platelets: 224 K/uL (ref 150–400)
RBC: 3.99 MIL/uL (ref 3.87–5.11)
RDW: 15.1 % (ref 11.5–15.5)
WBC: 7.1 K/uL (ref 4.0–10.5)
nRBC: 0 % (ref 0.0–0.2)

## 2023-10-07 DIAGNOSIS — F3189 Other bipolar disorder: Secondary | ICD-10-CM | POA: Diagnosis not present

## 2023-10-21 DIAGNOSIS — F429 Obsessive-compulsive disorder, unspecified: Secondary | ICD-10-CM | POA: Diagnosis not present

## 2023-10-21 DIAGNOSIS — F319 Bipolar disorder, unspecified: Secondary | ICD-10-CM | POA: Diagnosis not present

## 2023-10-25 NOTE — Progress Notes (Signed)
 SUBJECTIVE: Discussed the use of AI scribe software for clinical note transcription with the patient, who gave verbal consent to proceed.  Chief Complaint: Obesity  Interim History: She is down 5 lbs since her last visit.  Down 43 lbs overall TBW loss of ~15%   Brenda Hernandez is here to discuss her progress with her obesity treatment plan. She is on the Category 2 Plan and states she is following her eating plan approximately 50 % of the time. She states she is exercising 25-30 minutes 2 times per week. Brenda Hernandez is a 48 year old female with obesity who presents for follow-up of her obesity treatment plan.  She has a history of prediabetes, vitamin D  deficiency, B12 deficiency, and bipolar affective disorder. She is currently on a category two nutrition plan, adhering to it approximately fifty percent of the time.  She is making efforts to consume more whole foods, ensuring adequate protein and water intake.   She has lost five pounds since her last visit, contributing to a total weight loss of forty-three pounds.  She attributes her success to eliminating midnight snacking and avoiding foods like Fruit Loops and Klondike bars.  Her meals typically include a balanced dinner with protein and two sides, often choosing chicken drumsticks, chicken wings, pork chops, or ribs. Despite cravings for sweets, she has managed to keep them out of the house. She uses Premier protein drinks as a meal supplement, providing thirty grams of protein per serving, and estimates her total daily protein intake to be between seventy to seventy-five grams, which is nearly to her goal of 85 grams daily.  She engages in physical activity by riding an exercise bike for twenty-five to thirty minutes two days per week. She sleeps at least seven hours nightly.  She experienced a fall in the garage one to two months ago, affecting her walking. She has not yet returned to see her knee doctor but plans to do so after losing  more weight. She reports some knee pain but is managing to get around okay, walking with her cane.  She has an upcoming appointment with her PCP Dr. Garald on her birthday, where she expects to have labs done.  Reports she is seeing her PCP 11/16/23 and would prefer to have labs done at his office.   OBJECTIVE: Visit Diagnoses: Problem List Items Addressed This Visit     Pre-diabetes   Vitamin D  deficiency   Low serum vitamin B12   Morbid obesity (HCC)   Other Visit Diagnoses       Polyphagia    -  Primary     BMI 40.0-44.9, adult (HCC) Current BMI 43.4         Obesity with polyphagia Obesity management is ongoing with a focus on dietary changes and exercise. She has lost 43 pounds in total, with a recent 5-pound loss since the last visit. She is following a category two nutrition plan approximately 50% of the time, focusing on whole foods and protein intake. She is exercising on an exercise bike for 25-30 minutes twice a week.  She has reduced late-night snacking and is avoiding high-calorie foods like Klondike bars and Fruit Loops. Polyphagia is controlled when eating on plan and reports no excessive cravings when eating on plan.  Her BMI is currently 8, and she has gained muscle mass while reducing adipose tissue. She is consuming Premier protein drinks to supplement her protein intake, which is helping her meet her nutritional needs. - Continue current nutrition  plan and exercise regimen - Encourage avoidance of late-night snacking and high-calorie foods - Consider trying [employer] Greek yogurt bars as a healthier snack option - Maintain protein intake with Premier protein drinks  Prediabetes Last A1c was 5.1- at goal/ Insulin 9.9- nearing goal of <5  Medication(s): metformin 1000 mg BID per other provider Reports no GI upset or other side effects Polyphagia:No Lab Results  Component Value Date   HGBA1C 5.1 06/24/2023   HGBA1C 5.0 04/30/2023   HGBA1C 5.5 03/05/2023   HGBA1C  5.4 01/02/2023   HGBA1C 5.6 06/17/2022   Lab Results  Component Value Date   INSULIN 9.9 06/17/2022   INSULIN 14.1 06/18/2021   INSULIN 6.0 12/05/2020   INSULIN 10.8 07/18/2020   INSULIN 24.7 03/26/2020    Plan: Continue metformin 1000 mg BID per other providers Continue working on nutrition plan to decrease simple carbohydrates, increase lean proteins and exercise to promote weight loss, improve glycemic control and prevent progression to Type 2 diabetes.   B 12 deficiency On B 12 500 mcg daily. No SE. Reports energy levels good overall Lab Results  Component Value Date   VITAMINB12 1,135 (H) 06/24/2023   Plan: Continue B 12 supplementation and recheck labs and adjust accordingly.  Vitamin D Deficiency Vitamin D is at goal of 50.  Most recent vitamin D level was 57.26. She is on  prescription ergocalciferol 50,000 IU weekly. Lab Results  Component Value Date   VD25OH 57.26 06/24/2023   VD25OH 80.08 04/30/2023   VD25OH 71.90 03/05/2023    Plan: Continue  prescription ergocalciferol 50,000 IU weekly Low vitamin D levels can be associated with adiposity and may result in leptin resistance and weight gain. Also associated with fatigue.  Currently on vitamin D supplementation without any adverse effects such as nausea, vomiting or muscle weakness.  Recheck vitamin D levels . Pt reports having labs done at PCP office .  Vitals Temp: 97.6 F (36.4 C) BP: 102/72 Pulse Rate: 94 SpO2: (!) 0 %   Anthropometric Measurements Height: 5' 3 (1.6 m) Weight: 244 lb (110.7 kg) BMI (Calculated): 43.23 Weight at Last Visit: 249 lb Weight Lost Since Last Visit: 5 lb Weight Gained Since Last Visit: 0 Starting Weight: 287 lb Total Weight Loss (lbs): 43 lb (19.5 kg) Peak Weight: 303 lb   Body Composition  Body Fat %: 55.4 % Fat Mass (lbs): 135.6 lbs Muscle Mass (lbs): 103.6 lbs Visceral Fat Rating : 17   Other Clinical Data Fasting: yes Labs: no Today's Visit #:  55 Starting Date: 03/26/20     ASSESSMENT AND PLAN:  Diet: Brenda Hernandez is currently in the action stage of change. As such, her goal is to continue with weight loss efforts. She has agreed to Category 2 Plan.  Exercise: Brenda Hernandez has been instructed to try a geriatric exercise plan and to continue exercising as is for weight loss and overall health benefits.   Behavior Modification:  We discussed the following Behavioral Modification Strategies today: increasing lean protein intake, decreasing simple carbohydrates, increasing vegetables, increase H2O intake, increase high fiber foods, better snacking choices, avoiding temptations, and planning for success. We discussed various medication options to help Jeniece with her weight loss efforts and we both agreed to continue current treatment plan.  Return in about 5 weeks (around 11/30/2023).[name] She was informed of the importance of frequent follow up visits to maximize her success with intensive lifestyle modifications for her multiple health conditions.  Attestation Statements:   Reviewed by clinician on day  of visit: allergies, medications, problem list, medical history, surgical history, family history, social history, and previous encounter notes.   Time spent on visit including pre-visit chart review and post-visit care and charting was 31 minutes.    Makye Radle, PA-C

## 2023-10-26 ENCOUNTER — Encounter (INDEPENDENT_AMBULATORY_CARE_PROVIDER_SITE_OTHER): Payer: Self-pay | Admitting: Physician Assistant

## 2023-10-26 ENCOUNTER — Ambulatory Visit (INDEPENDENT_AMBULATORY_CARE_PROVIDER_SITE_OTHER): Admitting: Physician Assistant

## 2023-10-26 VITALS — BP 102/72 | HR 94 | Temp 97.6°F | Ht 63.0 in | Wt 244.0 lb

## 2023-10-26 DIAGNOSIS — Z6841 Body Mass Index (BMI) 40.0 and over, adult: Secondary | ICD-10-CM

## 2023-10-26 DIAGNOSIS — E538 Deficiency of other specified B group vitamins: Secondary | ICD-10-CM | POA: Diagnosis not present

## 2023-10-26 DIAGNOSIS — E559 Vitamin D deficiency, unspecified: Secondary | ICD-10-CM

## 2023-10-26 DIAGNOSIS — G8929 Other chronic pain: Secondary | ICD-10-CM

## 2023-10-26 DIAGNOSIS — R7303 Prediabetes: Secondary | ICD-10-CM

## 2023-10-26 DIAGNOSIS — R632 Polyphagia: Secondary | ICD-10-CM | POA: Diagnosis not present

## 2023-10-29 ENCOUNTER — Other Ambulatory Visit (HOSPITAL_COMMUNITY)
Admission: AD | Admit: 2023-10-29 | Discharge: 2023-10-29 | Disposition: A | Discharge status: Home / Self Care | Attending: Psychology | Admitting: Psychology

## 2023-10-29 DIAGNOSIS — F319 Bipolar disorder, unspecified: Secondary | ICD-10-CM | POA: Diagnosis not present

## 2023-10-29 DIAGNOSIS — Z79899 Other long term (current) drug therapy: Secondary | ICD-10-CM | POA: Diagnosis not present

## 2023-10-29 LAB — CBC WITH DIFFERENTIAL/PLATELET
Abs Immature Granulocytes: 0.03 K/uL (ref 0.00–0.07)
Basophils Absolute: 0 K/uL (ref 0.0–0.1)
Basophils Relative: 0 %
Eosinophils Absolute: 0 K/uL (ref 0.0–0.5)
Eosinophils Relative: 0 %
HCT: 39.7 % (ref 36.0–46.0)
Hemoglobin: 13.5 g/dL (ref 12.0–15.0)
Immature Granulocytes: 0 %
Lymphocytes Relative: 52 %
Lymphs Abs: 3.7 K/uL (ref 0.7–4.0)
MCH: 31.8 pg (ref 26.0–34.0)
MCHC: 34 g/dL (ref 30.0–36.0)
MCV: 93.4 fL (ref 80.0–100.0)
Monocytes Absolute: 0.4 K/uL (ref 0.1–1.0)
Monocytes Relative: 6 %
Neutro Abs: 3 K/uL (ref 1.7–7.7)
Neutrophils Relative %: 42 %
Platelets: 232 K/uL (ref 150–400)
RBC: 4.25 MIL/uL (ref 3.87–5.11)
RDW: 14.8 % (ref 11.5–15.5)
WBC: 7.2 K/uL (ref 4.0–10.5)
nRBC: 0 % (ref 0.0–0.2)

## 2023-11-16 ENCOUNTER — Ambulatory Visit: Admitting: Internal Medicine

## 2023-11-16 ENCOUNTER — Encounter: Payer: Self-pay | Admitting: Internal Medicine

## 2023-11-16 VITALS — BP 120/80 | HR 98 | Temp 97.9°F | Ht 63.0 in | Wt 248.8 lb

## 2023-11-16 DIAGNOSIS — Z23 Encounter for immunization: Secondary | ICD-10-CM | POA: Diagnosis not present

## 2023-11-16 DIAGNOSIS — G8929 Other chronic pain: Secondary | ICD-10-CM | POA: Diagnosis not present

## 2023-11-16 DIAGNOSIS — I10 Essential (primary) hypertension: Secondary | ICD-10-CM

## 2023-11-16 DIAGNOSIS — R7303 Prediabetes: Secondary | ICD-10-CM | POA: Diagnosis not present

## 2023-11-16 DIAGNOSIS — M25562 Pain in left knee: Secondary | ICD-10-CM

## 2023-11-16 DIAGNOSIS — E538 Deficiency of other specified B group vitamins: Secondary | ICD-10-CM

## 2023-11-16 DIAGNOSIS — Z6841 Body Mass Index (BMI) 40.0 and over, adult: Secondary | ICD-10-CM

## 2023-11-16 NOTE — Progress Notes (Signed)
 Subjective:  Patient ID: Brenda Hernandez, female    DOB: 08/07/1975  Age: 48 y.o. MRN: [id number]  CC: Follow-up (20mo; no concerns)   HPI Brenda Hernandez presents for a well exam  Outpatient Medications Prior to Visit  Medication Sig Dispense Refill   aspirin EC 81 MG tablet Take 81 mg by mouth 2 (two) times daily. Swallow whole.     clomiPRAMINE (ANAFRANIL) 75 MG capsule Take 75 mg by mouth 2 (two) times daily.     cloZAPine  (CLOZARIL ) 100 MG tablet Take 5 tablets (500 mg total) by mouth daily. 150 tablet 2   cyanocobalamin  (VITAMIN B12) 500 MCG tablet Take 1 tablet (500 mcg total) by mouth daily. 90 tablet 0   divalproex  (DEPAKOTE  ER) 500 MG 24 hr tablet Take 4 tablets (2,000 mg total) by mouth daily. Take 4 po in the evening     ergocalciferol  (VITAMIN D2) 1.25 MG (50000 UT) capsule Take 50,000 Units by mouth once a week.     fluticasone  (FLONASE ) 50 MCG/ACT nasal spray Place 1 spray into both nostrils daily. 16 g 6   levonorgestrel -ethinyl estradiol (ALESSE) 0.1-20 MG-MCG tablet Sronyx 0.1 mg-20 mcg tablet  TAKE 1 TABLET BY MOUTH EVERY DAY     metFORMIN (GLUCOPHAGE) 1000 MG tablet Take 1,000 mg by mouth 2 (two) times daily with a meal.     No facility-administered medications prior to visit.    ROS: Review of Systems  Objective:  BP 120/80   Pulse 98   Temp 97.9 F (36.6 C)   Ht 5' 3 (1.6 m)   Wt 248 lb 12.8 oz (112.9 kg)   SpO2 99%   BMI 44.07 kg/m   BP Readings from Last 3 Encounters:  11/16/23 120/80  10/26/23 102/72  09/07/23 99/69    Wt Readings from Last 3 Encounters:  11/16/23 248 lb 12.8 oz (112.9 kg)  10/26/23 244 lb (110.7 kg)  09/07/23 249 lb (112.9 kg)    Physical Exam  Lab Results  Component Value Date   WBC 7.2 10/29/2023   HGB 13.5 10/29/2023   HCT 39.7 10/29/2023   PLT 232 10/29/2023   GLUCOSE 98 06/24/2023   CHOL 185 04/30/2023   TRIG 125 04/30/2023   HDL 57 04/30/2023   LDLCALC 103 (H) 04/30/2023   ALT 9 06/24/2023   AST 15  06/24/2023   NA 138 06/24/2023   K 4.0 06/24/2023   CL 105 06/24/2023   CREATININE 0.94 06/24/2023   BUN 10 06/24/2023   CO2 20 (L) 06/24/2023   TSH 2.467 06/24/2023   HGBA1C 5.1 06/24/2023    No results found.  Assessment & Plan:   Problem List Items Addressed This Visit     BMI 45.0-49.9, adult (HCC) - Primary   BMI of 40 or less is needed for TKR surgery      HTN (hypertension) (Chronic)   BP Readings from Last 3 Encounters:  11/16/23 120/80  10/26/23 102/72  09/07/23 99/69  On diet       Relevant Orders   Comprehensive metabolic panel with GFR   Knee pain, left   Brenda Hernandez needs to be <225 lbs, BMI <40 for knee surgery       Relevant Orders   Hemoglobin A1c   Comprehensive metabolic panel with GFR   Low serum vitamin B12   On B complex      Pre-diabetes   On Metformin      Relevant Orders   Hemoglobin A1c   Comprehensive metabolic  panel with GFR   Other Visit Diagnoses       Need for vaccination against Streptococcus pneumoniae       Relevant Orders   Pneumococcal conjugate vaccine 20-valent (PCV20)         No orders of the defined types were placed in this encounter.     Follow-up: Return in about 3 months (around 02/15/2024) for a follow-up visit.  Marolyn Noel, MD

## 2023-11-16 NOTE — Assessment & Plan Note (Signed)
 On Metformin

## 2023-11-16 NOTE — Assessment & Plan Note (Signed)
 BMI of 40 or less is needed for TKR surgery

## 2023-11-16 NOTE — Assessment & Plan Note (Signed)
 On B complex

## 2023-11-16 NOTE — Assessment & Plan Note (Signed)
 BP Readings from Last 3 Encounters:  11/16/23 120/80  10/26/23 102/72  09/07/23 99/69  On diet

## 2023-11-16 NOTE — Assessment & Plan Note (Signed)
 Brenda Hernandez needs to be <225 lbs, BMI <40 for knee surgery

## 2023-11-23 DIAGNOSIS — M1712 Unilateral primary osteoarthritis, left knee: Secondary | ICD-10-CM | POA: Diagnosis not present

## 2023-11-23 DIAGNOSIS — M25562 Pain in left knee: Secondary | ICD-10-CM | POA: Diagnosis not present

## 2023-11-29 NOTE — Progress Notes (Unsigned)
 SUBJECTIVE: Discussed the use of AI scribe software for clinical note transcription with the patient, who gave verbal consent to proceed.  Chief Complaint: Obesity  Interim History: She is up 3 lbs since her last visit. Down 40 lbs overall TBW loss of 13.9%  Jestine is here to discuss her progress with her obesity treatment plan. She is on the Category 2 Plan and states she is following her eating plan approximately 30-40 % of the time. She states she is not exercising due to increased knee pain.   Deya Rosete is a 48 year old female with obesity who presents for follow-up of her obesity treatment plan.  She is following a category two obesity treatment plan with a compliance rate of 30-40%. She has lost a total of 40 pounds, but her weight recently increased by 3 pounds, bringing her current weight to 247 pounds. Her BMI is 43.8.  She attributes the recent weight gain to dietary lapses, including consuming high-calorie foods such as bagels and cheddar snacks. She has been trying to maintain a diet high in protein, including drumsticks, pork chops, ham, and steak, but admits to occasional lapses. She has been using protein shakes and is exploring other low-calorie snack options.  She experiences increased left knee pain, which has impacted her ability to exercise. She has not been using her exercise bike or going to the gym due to the pain and colder weather.  Her past medical history includes polyphagia, prediabetes, and vitamin D  and B12 deficiencies. She is currently taking vitamin D  every two weeks and B12 once a week. She is also on medication for bipolar affective/schizoaffective disorder.  Her last A1c was 5.1. She reports good energy levels overall.  OBJECTIVE: Visit Diagnoses: Problem List Items Addressed This Visit     Pre-diabetes   Vitamin D  deficiency   Low serum vitamin B12   Knee pain, left - Primary   Morbid obesity (HCC)   Other Visit Diagnoses        Polyphagia         BMI 40.0-44.9, adult (HCC) Current BMI 43.9          Obesity with polyphagia Obesity with a BMI of 43.8. Weight has fluctuated, with a recent increase of 3 pounds, currently at 247 pounds. Total weight loss of 40 pounds achieved.  Compliance with the weight management plan is at 30-40%. Fair to moderate control of polyphagia currently.  On metformin to mitigate weight promoting effects of psychotropic medications.   Challenges include late-night snacking and high carbohydrate intake.  Exercise is limited due to left knee pain. The goal is to reduce BMI below 40 to qualify for knee surgery. - Continue category two weight management plan. - Limit daily caloric intake to 1200-1300 calories/ 85+ grams of protein. - Increase protein intake and reduce carbohydrate consumption. - Use My Fitness Pal or Lose It app for food journaling. - Encourage exercise as tolerated, focusing on non-impact activities like cycling.  Left knee pain OA Increased left knee pain limiting exercise. Pain may be exacerbated by weather changes. Weight loss is a prerequisite for surgical intervention. - Encourage weight loss to reduce knee pain  - Avoid exercises that exacerbate knee pain.  Prediabetes Current A1c at 5.1, which is below the prediabetic range. Previous highest A1c was 5.8. Continued monitoring is necessary. On metformin 1000 mg BID. NO GI or other SE.  Lab Results  Component Value Date   HGBA1C 5.1 06/24/2023   HGBA1C 5.0 04/30/2023  HGBA1C 5.5 03/05/2023   Lab Results  Component Value Date   LDLCALC 103 (H) 04/30/2023   CREATININE 0.94 06/24/2023   INSULIN   Date Value Ref Range Status  06/17/2022 9.9 2.6 - 24.9 uIU/mL Final  ]Continue working on nutrition plan to decrease simple carbohydrates, increase lean proteins and exercise to promote weight loss, improve glycemic control and prevent progression to Type 2 diabetes.  Continue metformin 1000 mg twice daily per  prescribing provider - Plan to check A1c levels and patient reports she was planning to obtain at [facility] clinic over the next couple of weeks.   Vitamin D  Deficiency Vitamin D  is at goal of 50.  Most recent vitamin D  level was 57.26. She is on  prescription ergocalciferol  50,000 IU every 14 days. No N/V or muscle weakness with ERgocalciferol .  Lab Results  Component Value Date   VD25OH 57.26 06/24/2023   VD25OH 80.08 04/30/2023   VD25OH 71.90 03/05/2023    Plan: Continue  prescription ergocalciferol  50,000 IU every 14 days Low vitamin D  levels can be associated with adiposity and may result in leptin resistance and weight gain. Also associated with fatigue.  Currently on vitamin D  supplementation without any adverse effects such as nausea, vomiting or muscle weakness.  Recheck vitamin D  levels with PCP planned  Vitamin B12 deficiency Vitamin B12 deficiency managed with B 12 500 mcg once weekly supplementation. Energy levels reported as good. Lab Results  Component Value Date   VITAMINB12 1,135 (H) 06/24/2023   Plan: - Continue vitamin B12 supplementation 500 mcg  once a week. Recheck levels with PCP planned.  Vitals Temp: 98.1 F (36.7 C) BP: (!) 121/90 Pulse Rate: 95 SpO2: 100 %   Anthropometric Measurements Height: 5' 3 (1.6 m) Weight: 247 lb (112 kg) BMI (Calculated): 43.76 Weight at Last Visit: 244 lb Weight Lost Since Last Visit: 0 Weight Gained Since Last Visit: 3 lb Starting Weight: 287 lb Total Weight Loss (lbs): 40 lb (18.1 kg) Peak Weight: 303 lb   Body Composition  Body Fat %: 56.3 % Fat Mass (lbs): 139.6 lbs Muscle Mass (lbs): 102.8 lbs Visceral Fat Rating : 18   Other Clinical Data Fasting: No Labs: No Today's Visit #: 50 Starting Date: 03/26/20     ASSESSMENT AND PLAN:  Diet: Avriel is currently in the action stage of change. As such, her goal is to continue with weight loss efforts. She has agreed to Category 2 Plan and keeping a  food journal and adhering to recommended goals of 1200-1300 calories and 85+ grams of  protein.  Exercise: Merrit has been instructed to try a geriatric exercise plan and that some exercise is better than none for weight loss and overall health benefits.   Behavior Modification:  We discussed the following Behavioral Modification Strategies today: increasing lean protein intake, decreasing simple carbohydrates, increasing vegetables, increase H2O intake, increase high fiber foods, meal planning and cooking strategies, better snacking choices, avoiding temptations, planning for success, and decrease junk food . We discussed various medication options to help Armonee with her weight loss efforts and we both agreed to continue current treatment plan.  Return in about 4 weeks (around 12/28/2023).[name] She was informed of the importance of frequent follow up visits to maximize her success with intensive lifestyle modifications for her multiple health conditions.  Attestation Statements:   Reviewed by clinician on day of visit: allergies, medications, problem list, medical history, surgical history, family history, social history, and previous encounter notes.   Time spent on  visit including pre-visit chart review and post-visit care and charting was 35 minutes.    Kahil Agner, PA-C

## 2023-11-30 ENCOUNTER — Encounter (INDEPENDENT_AMBULATORY_CARE_PROVIDER_SITE_OTHER): Payer: Self-pay | Admitting: Physician Assistant

## 2023-11-30 ENCOUNTER — Ambulatory Visit (INDEPENDENT_AMBULATORY_CARE_PROVIDER_SITE_OTHER): Admitting: Physician Assistant

## 2023-11-30 VITALS — BP 121/90 | HR 95 | Temp 98.1°F | Ht 63.0 in | Wt 247.0 lb

## 2023-11-30 DIAGNOSIS — M25562 Pain in left knee: Secondary | ICD-10-CM | POA: Diagnosis not present

## 2023-11-30 DIAGNOSIS — Z6841 Body Mass Index (BMI) 40.0 and over, adult: Secondary | ICD-10-CM

## 2023-11-30 DIAGNOSIS — E559 Vitamin D deficiency, unspecified: Secondary | ICD-10-CM

## 2023-11-30 DIAGNOSIS — E538 Deficiency of other specified B group vitamins: Secondary | ICD-10-CM

## 2023-11-30 DIAGNOSIS — R632 Polyphagia: Secondary | ICD-10-CM

## 2023-11-30 DIAGNOSIS — R7303 Prediabetes: Secondary | ICD-10-CM

## 2023-11-30 DIAGNOSIS — G8929 Other chronic pain: Secondary | ICD-10-CM | POA: Diagnosis not present

## 2023-12-03 ENCOUNTER — Ambulatory Visit: Payer: Self-pay | Admitting: Internal Medicine

## 2023-12-03 ENCOUNTER — Other Ambulatory Visit (INDEPENDENT_AMBULATORY_CARE_PROVIDER_SITE_OTHER)

## 2023-12-03 ENCOUNTER — Other Ambulatory Visit (HOSPITAL_COMMUNITY)
Admission: AD | Admit: 2023-12-03 | Discharge: 2023-12-03 | Disposition: A | Discharge status: Home / Self Care | Attending: Psychology | Admitting: Psychology

## 2023-12-03 DIAGNOSIS — I10 Essential (primary) hypertension: Secondary | ICD-10-CM | POA: Diagnosis not present

## 2023-12-03 DIAGNOSIS — Z01812 Encounter for preprocedural laboratory examination: Secondary | ICD-10-CM | POA: Diagnosis not present

## 2023-12-03 DIAGNOSIS — R7303 Prediabetes: Secondary | ICD-10-CM | POA: Diagnosis not present

## 2023-12-03 DIAGNOSIS — E538 Deficiency of other specified B group vitamins: Secondary | ICD-10-CM | POA: Diagnosis not present

## 2023-12-03 DIAGNOSIS — G8929 Other chronic pain: Secondary | ICD-10-CM

## 2023-12-03 DIAGNOSIS — M25562 Pain in left knee: Secondary | ICD-10-CM

## 2023-12-03 LAB — COMPREHENSIVE METABOLIC PANEL WITH GFR
ALT: 9 U/L (ref 0–35)
AST: 15 U/L (ref 0–37)
Albumin: 3.6 g/dL (ref 3.5–5.2)
Alkaline Phosphatase: 64 U/L (ref 39–117)
BUN: 17 mg/dL (ref 6–23)
CO2: 25 meq/L (ref 19–32)
Calcium: 8.9 mg/dL (ref 8.4–10.5)
Chloride: 104 meq/L (ref 96–112)
Creatinine, Ser: 0.72 mg/dL (ref 0.40–1.20)
GFR: 100.53 mL/min (ref >60.00)
Glucose, Bld: 86 mg/dL (ref 70–99)
Potassium: 4.2 meq/L (ref 3.5–5.1)
Sodium: 138 meq/L (ref 135–145)
Total Bilirubin: 0.3 mg/dL (ref 0.2–1.2)
Total Protein: 6.8 g/dL (ref 6.0–8.3)

## 2023-12-03 LAB — CBC WITH DIFFERENTIAL/PLATELET
Abs Immature Granulocytes: 0.04 K/uL (ref 0.00–0.07)
Basophils Absolute: 0 K/uL (ref 0.0–0.1)
Basophils Relative: 0 %
Eosinophils Absolute: 0 K/uL (ref 0.0–0.5)
Eosinophils Relative: 0 %
HCT: 38.5 % (ref 36.0–46.0)
Hemoglobin: 12.9 g/dL (ref 12.0–15.0)
Immature Granulocytes: 1 %
Lymphocytes Relative: 46 %
Lymphs Abs: 3.8 K/uL (ref 0.7–4.0)
MCH: 31.1 pg (ref 26.0–34.0)
MCHC: 33.5 g/dL (ref 30.0–36.0)
MCV: 92.8 fL (ref 80.0–100.0)
Monocytes Absolute: 0.6 K/uL (ref 0.1–1.0)
Monocytes Relative: 7 %
Neutro Abs: 3.8 K/uL (ref 1.7–7.7)
Neutrophils Relative %: 46 %
Platelets: 222 K/uL (ref 150–400)
RBC: 4.15 MIL/uL (ref 3.87–5.11)
RDW: 15.4 % (ref 11.5–15.5)
WBC: 8.3 K/uL (ref 4.0–10.5)
nRBC: 0 % (ref 0.0–0.2)

## 2023-12-03 LAB — HEMOGLOBIN A1C: Hgb A1c MFr Bld: 5.8 % (ref 4.6–6.5)

## 2023-12-09 DIAGNOSIS — F319 Bipolar disorder, unspecified: Secondary | ICD-10-CM | POA: Diagnosis not present

## 2023-12-09 DIAGNOSIS — F429 Obsessive-compulsive disorder, unspecified: Secondary | ICD-10-CM | POA: Diagnosis not present

## 2023-12-15 ENCOUNTER — Ambulatory Visit: Admitting: Internal Medicine

## 2023-12-15 DIAGNOSIS — F259 Schizoaffective disorder, unspecified: Secondary | ICD-10-CM | POA: Diagnosis not present

## 2023-12-15 DIAGNOSIS — Z9989 Dependence on other enabling machines and devices: Secondary | ICD-10-CM | POA: Diagnosis not present

## 2023-12-15 DIAGNOSIS — E119 Type 2 diabetes mellitus without complications: Secondary | ICD-10-CM | POA: Diagnosis not present

## 2023-12-15 DIAGNOSIS — M1990 Unspecified osteoarthritis, unspecified site: Secondary | ICD-10-CM | POA: Diagnosis not present

## 2023-12-15 DIAGNOSIS — R030 Elevated blood-pressure reading, without diagnosis of hypertension: Secondary | ICD-10-CM | POA: Diagnosis not present

## 2023-12-15 DIAGNOSIS — F429 Obsessive-compulsive disorder, unspecified: Secondary | ICD-10-CM | POA: Diagnosis not present

## 2023-12-15 DIAGNOSIS — R2681 Unsteadiness on feet: Secondary | ICD-10-CM | POA: Diagnosis not present

## 2023-12-15 DIAGNOSIS — Z7982 Long term (current) use of aspirin: Secondary | ICD-10-CM | POA: Diagnosis not present

## 2023-12-15 DIAGNOSIS — F319 Bipolar disorder, unspecified: Secondary | ICD-10-CM | POA: Diagnosis not present

## 2023-12-15 DIAGNOSIS — Z833 Family history of diabetes mellitus: Secondary | ICD-10-CM | POA: Diagnosis not present

## 2023-12-15 DIAGNOSIS — Z809 Family history of malignant neoplasm, unspecified: Secondary | ICD-10-CM | POA: Diagnosis not present

## 2023-12-17 ENCOUNTER — Other Ambulatory Visit: Payer: Self-pay | Admitting: Internal Medicine

## 2023-12-17 NOTE — Telephone Encounter (Signed)
 Copied from CRM [id number]. Topic: Clinical - Medication Refill >> Dec 17, 2023  2:59 PM [place] J wrote: Medication: metFORMIN (GLUCOPHAGE) 1000 MG tablet  Has the patient contacted their pharmacy? No (Agent: If no, request that the patient contact the pharmacy for the refill. If patient does not wish to contact the pharmacy document the reason why and proceed with request.) (Agent: If yes, when and what did the pharmacy advise?) Patient had a previous discuss with her provider about this medication.  This is the patient's preferred pharmacy:  [id number] - [place],  - [address] AT [facility] [address] [place] [address] Phone: [phone] Fax: [phone]  Is this the correct pharmacy for this prescription? Yes If no, delete pharmacy and type the correct one.   Has the prescription been filled recently? No  Is the patient out of the medication? No  Has the patient been seen for an appointment in the last year OR does the patient have an upcoming appointment? Yes  Can we respond through MyChart? Yes  Agent: Please be advised that Rx refills may take up to 3 business days. We ask that you follow-up with your pharmacy.

## 2023-12-21 MED ORDER — METFORMIN HCL 1000 MG PO TABS: 1000.0000 mg | ORAL_TABLET | Freq: Two times a day (BID) | ORAL | 1 refills | Status: AC

## 2023-12-23 DIAGNOSIS — F3189 Other bipolar disorder: Secondary | ICD-10-CM | POA: Diagnosis not present

## 2023-12-28 ENCOUNTER — Ambulatory Visit (INDEPENDENT_AMBULATORY_CARE_PROVIDER_SITE_OTHER): Admitting: Physician Assistant

## 2023-12-28 VITALS — BP 139/92 | HR 95 | Temp 97.9°F | Ht 63.0 in | Wt 248.0 lb

## 2023-12-28 DIAGNOSIS — M25561 Pain in right knee: Secondary | ICD-10-CM | POA: Diagnosis not present

## 2023-12-28 DIAGNOSIS — E559 Vitamin D deficiency, unspecified: Secondary | ICD-10-CM

## 2023-12-28 DIAGNOSIS — E538 Deficiency of other specified B group vitamins: Secondary | ICD-10-CM | POA: Diagnosis not present

## 2023-12-28 DIAGNOSIS — F259 Schizoaffective disorder, unspecified: Secondary | ICD-10-CM

## 2023-12-28 DIAGNOSIS — G8929 Other chronic pain: Secondary | ICD-10-CM | POA: Diagnosis not present

## 2023-12-28 DIAGNOSIS — M25562 Pain in left knee: Secondary | ICD-10-CM | POA: Diagnosis not present

## 2023-12-28 DIAGNOSIS — R632 Polyphagia: Secondary | ICD-10-CM | POA: Diagnosis not present

## 2023-12-28 DIAGNOSIS — Z6841 Body Mass Index (BMI) 40.0 and over, adult: Secondary | ICD-10-CM

## 2023-12-28 DIAGNOSIS — R7303 Prediabetes: Secondary | ICD-10-CM | POA: Diagnosis not present

## 2023-12-28 NOTE — Progress Notes (Signed)
 SUBJECTIVE: Discussed the use of AI scribe software for clinical note transcription with the patient, who gave verbal consent to proceed.  Chief Complaint: Obesity  Interim History: She is up 1 lb since her last visit.  Down 39 lbs overall TBW loss of 13.6 %  Brenda Hernandez is here to discuss her progress with her obesity treatment plan. She is on the Category 2 Plan and states she is following her eating plan approximately 50 % of the time. She states she is exercising Chair exercises 10 minutes 2 times per week.  Brenda Hernandez is a 48 year old female with obesity who presents for follow-up of her obesity treatment plan.  She is adhering to her category two obesity treatment plan approximately 50% of the time. Since her last visit, she has gained one pound, but overall, she has lost 39 pounds. She is attempting to track calories and macros, focusing on whole foods, but is not consuming the prescribed amount of protein or drinking enough water. She chronically skips meals, typically eating only two meals daily, and tries to meet her protein and calorie needs within these meals.We discussed the importance of regular meals and adequate protein intake for weight loss and overall health.  She is currently engaging in chair exercises for ten minutes, two days per week. She experiences persistent knee pain and has not been using any topical treatments for her knees recently.  Her current medications include ergocalciferol  50,000 units once every fourteen days, vitamin B12 500 micrograms once weekly, and metformin 1,000 mg twice daily for prediabetes. She experiences polyphagia, which is exacerbated by her medication regimen for schizoaffective disorder.  She has been consuming chocolate-covered pretzels regularly, which may be contributing to her recent weight gain. She lives with her mother and stepfather, who assist her with food, especially since her food assistance benefits have decreased. She is  exploring local food banks for additional resources. OBJECTIVE: Visit Diagnoses: Problem List Items Addressed This Visit     Pre-diabetes   Vitamin D  deficiency   Low serum vitamin B12   Bilateral chronic knee pain   Morbid obesity (HCC)   Other Visit Diagnoses       Polyphagia    -  Primary     BMI 40.0-44.9, adult (HCC) Current BMI 44.0         Obesity with polyphagia Morbid obesity with recent weight gain of one pound since the last visit, but an overall weight loss of 39 pounds. Following a category two plan approximately 50% of the time. Inadequate protein intake and insufficient water consumption. Skipping meals and consuming mostly two meals daily. Engaging in chair exercises for ten minutes two days per week. Tracking calories and macros, eating more whole foods. Gained almost a pound of muscle and lost 0.6 pounds of adipose tissue, indicating a change in body composition. Exploring different protein sources and snacks to improve diet. Financial constraints impact ability to purchase certain foods, but has support from family and access to food banks. - Encourage increasing protein intake and water consumption. - Recommend trying different protein sources such as breakfast sandwiches with lean ham or egg and cheese - Advise against consuming high-sugar snacks like chocolate-covered pretzels and recommend healthier alternatives as much as possible - Encourage increasing chair exercises to three to four times per week.  Prediabetes Prediabetes with an A1c of 5.8, slightly elevated from the previous level of 5.1. On metformin 1000 mg twice daily. Increase in A1c may be related to consumption of  high-sugar snacks like chocolate-covered pretzels. - Continue metformin 1000 mg twice daily. - Advise choosing healthier snacks with lower sugar content. - Monitor A1c levels regularly.  Right knee pain Chronic right knee pain, worsened by cooler weather. Performing chair exercises twice a  week for ten minutes. Knees have been hurting more recently. Tried Voltaren gel without relief. - Recommend increasing chair exercises to three to four times per week. - Suggest using Salonpas patches for knee pain relief.  Schizoaffective disorder Schizoaffective disorder managed with medication. Medication regimen contributes to increased polyphagia, complicating weight management efforts. - Continue current medication regimen for schizoaffective disorder.  Vitamin D  deficiency Vitamin D  deficiency managed with ergocalciferol  50,000 units every 14 days. Vitamin D  levels will be rechecked in a few months to ensure they remain within the target range. - Continue ergocalciferol  50,000 units every 14 days. - Recheck vitamin D  levels in a few months.  Deficiency of B group vitamins Deficiency of B group vitamins managed with vitamin B12 500 mcg tablets once weekly. Levels are being monitored, and the current regimen is deemed appropriate. - Continue vitamin B12 500 mcg tablets once weekly.  Vitals Temp: 97.9 F (36.6 C) BP: (!) 139/92 Pulse Rate: 95 SpO2: 99 %   Anthropometric Measurements Height: 5' 3 (1.6 m) Weight: 248 lb (112.5 kg) BMI (Calculated): 43.94 Weight at Last Visit: 247 lb Weight Lost Since Last Visit: 0 Weight Gained Since Last Visit: 1 lb Starting Weight: 287 lb Total Weight Loss (lbs): 39 lb (17.7 kg) Peak Weight: 303 lb   Body Composition  Body Fat %: 56 % Fat Mass (lbs): 139 lbs Muscle Mass (lbs): 103.6 lbs Visceral Fat Rating : 17   Other Clinical Data Fasting: No Labs: No Today's Visit #: 51 Starting Date: 03/26/20     ASSESSMENT AND PLAN:  Diet: Brenda Hernandez is currently in the action stage of change. As such, her goal is to continue with weight loss efforts. She has agreed to Category 2 Plan.  Exercise: Brenda Hernandez has been instructed to try a geriatric exercise plan and to continue exercising as is for weight loss and overall health  benefits.   Behavior Modification:  We discussed the following Behavioral Modification Strategies today: increasing lean protein intake, decreasing simple carbohydrates, increasing vegetables, increase H2O intake, increase high fiber foods, meal planning and cooking strategies, better snacking choices, avoiding temptations, and planning for success. We discussed various medication options to help Brenda Hernandez with her weight loss efforts and we both agreed to continue current treatment plan.  Return in about 4 weeks (around 01/25/2024).Brenda Hernandez She was informed of the importance of frequent follow up visits to maximize her success with intensive lifestyle modifications for her multiple health conditions.  Attestation Statements:   Reviewed by clinician on day of visit: allergies, medications, problem list, medical history, surgical history, family history, social history, and previous encounter notes.   Time spent on visit including pre-visit chart review and post-visit care and charting was 49 minutes.    Brenda Gatliff, PA-C

## 2023-12-31 ENCOUNTER — Ambulatory Visit

## 2023-12-31 ENCOUNTER — Other Ambulatory Visit (HOSPITAL_COMMUNITY)
Admission: AD | Admit: 2023-12-31 | Discharge: 2023-12-31 | Disposition: A | Discharge status: Home / Self Care | Attending: Psychology | Admitting: Psychology

## 2023-12-31 ENCOUNTER — Ambulatory Visit: Payer: Self-pay | Admitting: Internal Medicine

## 2023-12-31 VITALS — Ht 63.0 in | Wt 248.0 lb

## 2023-12-31 DIAGNOSIS — Z124 Encounter for screening for malignant neoplasm of cervix: Secondary | ICD-10-CM | POA: Diagnosis not present

## 2023-12-31 DIAGNOSIS — F319 Bipolar disorder, unspecified: Secondary | ICD-10-CM | POA: Insufficient documentation

## 2023-12-31 DIAGNOSIS — Z79899 Other long term (current) drug therapy: Secondary | ICD-10-CM | POA: Insufficient documentation

## 2023-12-31 DIAGNOSIS — Z0000 Encounter for general adult medical examination without abnormal findings: Secondary | ICD-10-CM

## 2023-12-31 DIAGNOSIS — Z1231 Encounter for screening mammogram for malignant neoplasm of breast: Secondary | ICD-10-CM | POA: Diagnosis not present

## 2023-12-31 LAB — CBC WITH DIFFERENTIAL/PLATELET
Abs Immature Granulocytes: 0.03 K/uL (ref 0.00–0.07)
Basophils Absolute: 0 K/uL (ref 0.0–0.1)
Basophils Relative: 0 %
Eosinophils Absolute: 0 K/uL (ref 0.0–0.5)
Eosinophils Relative: 0 %
HCT: 39.7 % (ref 36.0–46.0)
Hemoglobin: 13.4 g/dL (ref 12.0–15.0)
Immature Granulocytes: 0 %
Lymphocytes Relative: 53 %
Lymphs Abs: 4 K/uL (ref 0.7–4.0)
MCH: 31.2 pg (ref 26.0–34.0)
MCHC: 33.8 g/dL (ref 30.0–36.0)
MCV: 92.3 fL (ref 80.0–100.0)
Monocytes Absolute: 0.5 K/uL (ref 0.1–1.0)
Monocytes Relative: 7 %
Neutro Abs: 3.1 K/uL (ref 1.7–7.7)
Neutrophils Relative %: 40 %
Platelets: 228 K/uL (ref 150–400)
RBC: 4.3 MIL/uL (ref 3.87–5.11)
RDW: 16 % — ABNORMAL HIGH (ref 11.5–15.5)
WBC: 7.7 K/uL (ref 4.0–10.5)
nRBC: 0 % (ref 0.0–0.2)

## 2023-12-31 NOTE — Patient Instructions (Addendum)
 Ms. Brenda Hernandez,  Thank you for taking the time for your Medicare Wellness Visit. I appreciate your continued commitment to your health goals. Please review the care plan we discussed, and feel free to reach out if I can assist you further.  Medicare recommends these wellness visits once per year to help you and your care team stay ahead of potential health issues. These visits are designed to focus on prevention, allowing your provider to concentrate on managing your acute and chronic conditions during your regular appointments.  Please note that Annual Wellness Visits do not include a physical exam. Some assessments may be limited, especially if the visit was conducted virtually. If needed, we may recommend a separate in-person follow-up with your provider.  Ongoing Care Seeing your primary care provider every 3 to 6 months helps us  monitor your health and provide consistent, personalized care.   Referrals If a referral was made during today's visit and you haven't received any updates within two weeks, please contact the referred provider directly to check on the status.  Recommended Screenings:  Health Maintenance  Topic Date Due   Complete foot exam   Never done   HIV Screening  Never done   Yearly kidney health urinalysis for diabetes  Never done   Hepatitis C Screening  Never done   Hepatitis B Vaccine (1 of 3 - 19+ 3-dose series) Never done   COVID-19 Vaccine (3 - Pfizer risk series) 07/15/2019   Breast Cancer Screening  01/10/2020   HPV Vaccine (3 - Risk 3-dose series) 12/16/2020   Eye exam for diabetics  08/26/2022   Colon Cancer Screening  Never done   Medicare Annual Wellness Visit  12/30/2023   Pap with HPV screening  02/13/2024   Hemoglobin A1C  06/01/2024   Yearly kidney function blood test for diabetes  12/02/2024   DTaP/Tdap/Td vaccine (2 - Td or Tdap) 05/15/2027   Pneumococcal Vaccine  Completed   Flu Shot  Completed   Meningitis B Vaccine  Aged Out        12/31/2023    2:37 PM  Advanced Directives  Does Patient Have a Medical Advance Directive? No  Would patient like information on creating a medical advance directive? No - Patient declined   Advance Care Planning is important because it: Ensures you receive medical care that aligns with your values, goals, and preferences. Provides guidance to your family and loved ones, reducing the emotional burden of decision-making during critical moments.  Vision: Annual vision screenings are recommended for early detection of glaucoma, cataracts, and diabetic retinopathy. These exams can also reveal signs of chronic conditions such as diabetes and high blood pressure.  Dental: Annual dental screenings help detect early signs of oral cancer, gum disease, and other conditions linked to overall health, including heart disease and diabetes.

## 2023-12-31 NOTE — Progress Notes (Addendum)
 Subjective:  Please attest and cosign this visit due to patients primary care provider not being in the office at the time the visit was completed.  (Pt of Dr A. Plotnikov)   Brenda Hernandez is a 48 y.o. who presents for a Medicare Wellness preventive visit.  As a reminder, Annual Wellness Visits don't include a physical exam, and some assessments may be limited, especially if this visit is performed virtually. We may recommend an in-person follow-up visit with your provider if needed.  Visit Complete: Virtual I connected with  Brenda Hernandez on 12/31/23 by a audio enabled telemedicine application and verified that I am speaking with the correct person using two identifiers.  Patient Location: Home  Provider Location: Office/Clinic  I discussed the limitations of evaluation and management by telemedicine. The patient expressed understanding and agreed to proceed.  Vital Signs: Because this visit was a virtual/telehealth visit, some criteria may be missing or patient reported. Any vitals not documented were not able to be obtained and vitals that have been documented are patient reported.  VideoDeclined- This patient declined Librarian, academic. Therefore the visit was completed with audio only.  Persons Participating in Visit: Patient.  AWV Questionnaire: Yes: Patient Medicare AWV questionnaire was completed by the patient on 12/28/2023; I have confirmed that all information answered by patient is correct and no changes since this date.  Cardiac Risk Factors include: advanced age (>66men, >63 women);hypertension     Objective:    Today's Vitals   12/31/23 1437  Weight: 248 lb (112.5 kg)  Height: 5' 3 (1.6 m)   Body mass index is 43.93 kg/m.     12/31/2023    2:37 PM 06/24/2023    1:15 PM 12/30/2022    2:07 PM 10/09/2022    7:10 AM 09/28/2022   11:57 AM 10/31/2021    2:05 PM 10/09/2020    5:10 PM  Advanced Directives  Does Patient Have a Medical  Advance Directive? No No No No No No No  Would patient like information on creating a medical advance directive? No - Patient declined  No - Patient declined No - Patient declined No - Patient declined No - Patient declined Yes (MAU/Ambulatory/Procedural Areas - Information given)    Current Medications (verified) Outpatient Encounter Medications as of 12/31/2023  Medication Sig   aspirin EC 81 MG tablet Take 81 mg by mouth 2 (two) times daily. Swallow whole.   clomiPRAMINE (ANAFRANIL) 75 MG capsule Take 75 mg by mouth 2 (two) times daily.   cloZAPine  (CLOZARIL ) 100 MG tablet Take 5 tablets (500 mg total) by mouth daily.   cyanocobalamin  (VITAMIN B12) 500 MCG tablet Take 500 mcg by mouth once a week.   divalproex  (DEPAKOTE  ER) 500 MG 24 hr tablet Take 4 tablets (2,000 mg total) by mouth daily. Take 4 po in the evening   ergocalciferol  (VITAMIN D2) 1.25 MG (50000 UT) capsule Take 50,000 Units by mouth every 14 (fourteen) days.   fluticasone  (FLONASE ) 50 MCG/ACT nasal spray Place 1 spray into both nostrils daily.   levonorgestrel -ethinyl estradiol (ALESSE) 0.1-20 MG-MCG tablet Sronyx 0.1 mg-20 mcg tablet  TAKE 1 TABLET BY MOUTH EVERY DAY   metFORMIN (GLUCOPHAGE) 1000 MG tablet Take 1 tablet (1,000 mg total) by mouth 2 (two) times daily with a meal.   No facility-administered encounter medications on file as of 12/31/2023.    Allergies (verified) Citric acid, Citrus, and Diphenhydramine   History: Past Medical History:  Diagnosis Date   Abnormal pap  Bipolar 1 disorder (HCC)    GERD (gastroesophageal reflux disease)    Pre-diabetes    Schizophrenia (HCC)    Swallowing difficulty    Past Surgical History:  Procedure Laterality Date   BREAST BIOPSY Right 07/16/2022   US  RT BREAST BX W LOC DEV 1ST LESION IMG BX SPEC US  GUIDE 07/16/2022 [facility]   BREAST BIOPSY  10/08/2022   US  RT RADIOACTIVE SEED LOC 10/08/2022 [facility]   BREAST LUMPECTOMY WITH RADIOACTIVE SEED  LOCALIZATION Right 10/09/2022   Procedure: RIGHT BREAST LUMPECTOMY WITH RADIOACTIVE SEED LOCALIZATION;  Surgeon: Curvin Deward MOULD, MD;  Location: [facility];  Service: General;  Laterality: Right;   Family History  Problem Relation Age of Onset   Diabetes Father    High blood pressure Father    High Cholesterol Father    Diabetes Maternal Grandmother    Diabetes Maternal Grandfather    Diabetes Paternal Grandmother    Diabetes Paternal Grandfather    Breast cancer Maternal Aunt    Breast cancer Paternal Aunt    Parkinson's disease Paternal Uncle    Social History   Socioeconomic History   Marital status: Divorced    Spouse name: Not on file   Number of children: Not on file   Years of education: Not on file   Highest education level: Bachelor's degree (e.g., BA, AB, BS)  Occupational History   Occupation: disability  Tobacco Use   Smoking status: Never   Smokeless tobacco: Never  Vaping Use   Vaping status: Never Used  Substance and Sexual Activity   Alcohol use: No   Drug use: No   Sexual activity: Not Currently    Birth control/protection: Abstinence, Pill  Other Topics Concern   Not on file  Social History Narrative   Right handed       Disability    Social Drivers of Health   Financial Resource Strain: Low Risk  (12/31/2023)   Overall Financial Resource Strain (CARDIA)    Difficulty of Paying Living Expenses: Not very hard  Food Insecurity: No Food Insecurity (12/31/2023)   Hunger Vital Sign    Worried About Running Out of Food in the Last Year: Never true    Ran Out of Food in the Last Year: Never true  Transportation Needs: No Transportation Needs (12/31/2023)   PRAPARE - Administrator, Civil Service (Medical): No    Lack of Transportation (Non-Medical): No  Physical Activity: Insufficiently Active (12/31/2023)   Exercise Vital Sign    Days of Exercise per Week: 2 days    Minutes of Exercise per Session: 10 min  Stress: No  Stress Concern Present (12/31/2023)   [employer] of Occupational Health - Occupational Stress Questionnaire    Feeling of Stress: Not at all  Social Connections: Moderately Integrated (12/31/2023)   Social Connection and Isolation Panel    Frequency of Communication with Friends and Family: More than three times a week    Frequency of Social Gatherings with Friends and Family: More than three times a week    Attends Religious Services: More than 4 times per year    Active Member of [employer] or Organizations: Yes    Attends Engineer, structural: More than 4 times per year    Marital Status: Divorced    Tobacco Counseling Counseling given: Not Answered    Clinical Intake:  Pre-visit preparation completed: Yes  Pain : No/denies pain     BMI - recorded: 43.93 Nutritional Status:  BMI > 30  Obese Nutritional Risks: None Diabetes: No  Lab Results  Component Value Date   HGBA1C 5.8 12/03/2023   HGBA1C 5.1 06/24/2023   HGBA1C 5.0 04/30/2023     How often do you need to have someone help you when you read instructions, pamphlets, or other written materials from your doctor or pharmacy?: 1 - Never  Interpreter Needed?: No  Information entered by :: Verdie Saba, CMA   Activities of Daily Living     12/31/2023    2:41 PM 12/28/2023    1:42 AM  In your present state of health, do you have any difficulty performing the following activities:  Hearing? 0 0  Vision? 0 0  Difficulty concentrating or making decisions? 0 0  Walking or climbing stairs? 1 1  Comment uses a cane   Dressing or bathing? 0 0  Doing errands, shopping? 0 0  Preparing Food and eating ? N N  Using the Toilet? N N  In the past six months, have you accidently leaked urine? N N  Do you have problems with loss of bowel control? N N  Managing your Medications? N N  Managing your Finances? N N  Housekeeping or managing your Housekeeping? N N    Patient Care Team: Plotnikov, Karlynn GAILS, MD  as PCP - General (Internal Medicine) Kay Kemps, MD as Consulting Physician (Orthopedic Surgery) Verdon Louann BIRCH, MD Becki Krabbe, FNP (Nurse Practitioner) Claudene Muskrat, OD as Consulting Physician (Optometry) Ob/Gyn, [place] as Consulting Physician (Obstetrics and Gynecology) Merilee Eland, Counselor as Counselor (Psychiatry) Curvin Deward MOULD, MD as Consulting Physician (General Surgery) Merilee Laymon MATSU, NP as Nurse Practitioner (Psychology)  I have updated your Care Teams any recent Medical Services you may have received from other providers in the past year.     Assessment:   This is a routine wellness examination for Brenda Hernandez.  Hearing/Vision screen Hearing Screening - Comments:: Denies hearing difficulties   Vision Screening - Comments:: Denies vision concerns - Wears rx glasses - up to date with routine eye exams   Goals Addressed               This Visit's Progress     Patient Stated (pt-stated)        Patient stated she plans to continue monitoring her health       Depression Screen     12/31/2023    2:42 PM 08/17/2023    2:06 PM 12/30/2022    2:16 PM 12/15/2022    2:02 PM 02/12/2022    1:18 PM 10/31/2021    2:19 PM 03/26/2020    8:02 AM  PHQ 2/9 Scores  PHQ - 2 Score 0 0 0 0 0 0 4  PHQ- 9 Score 0  0    10    Fall Risk     12/31/2023    2:42 PM 12/28/2023    1:42 AM 08/17/2023    2:06 PM 06/24/2023    1:09 PM 12/30/2022    2:08 PM  Fall Risk   Falls in the past year? 1  0 1 1  Number falls in past yr: 1 1 0 1 0  Comment 2      Injury with Fall? 0  0 0 0  Risk for fall due to : Impaired balance/gait;History of fall(s)  No Fall Risks    Follow up Falls evaluation completed;Falls prevention discussed  Falls evaluation completed Falls evaluation completed Falls evaluation completed;Education provided    MEDICARE  RISK AT HOME:  Medicare Risk at Home Any stairs in or around the home?: Yes If so, are there any without  handrails?: No Home free of loose throw rugs in walkways, pet beds, electrical cords, etc?: No Adequate lighting in your home to reduce risk of falls?: Yes Life alert?: No Use of a cane, walker or w/c?:  (cane) Grab bars in the bathroom?: Yes Shower chair or bench in shower?: No Elevated toilet seat or a handicapped toilet?: No  TIMED UP AND GO:  Was the test performed?  No  Cognitive Function: 6CIT completed    12/30/2022    2:15 PM  MMSE - Mini Mental State Exam  Not completed: Unable to complete        12/31/2023    2:44 PM 12/30/2022    2:10 PM 10/31/2021    2:22 PM 10/12/2018    2:29 PM  6CIT Screen  What Year? 0 points 0 points 0 points 0 points  What month? 0 points 0 points 0 points 0 points  What time? 0 points 0 points 0 points 0 points  Count back from 20 0 points 0 points 0 points 0 points  Months in reverse 0 points 0 points 0 points 0 points  Repeat phrase 0 points 0 points 0 points 0 points  Total Score 0 points 0 points 0 points 0 points    Immunizations Immunization History  Administered Date(s) Administered   HPV 9-valent 03/11/2020, 05/16/2020, 05/29/2020, 08/16/2020   Influenza Inj Mdck Quad Pf 01/02/2017   Influenza, Seasonal, Injecte, Preservative Fre 11/16/2023   Influenza,inj,Quad PF,6+ Mos 02/13/2018, 12/24/2018, 02/19/2020, 12/24/2020, 01/29/2022   PFIZER(Purple Top)SARS-COV-2 Vaccination 05/27/2019, 06/17/2019   PNEUMOCOCCAL CONJUGATE-20 11/16/2023   PPD Test 05/20/2017   Tdap 05/14/2017    Screening Tests Health Maintenance  Topic Date Due   FOOT EXAM  Never done   HIV Screening  Never done   Diabetic kidney evaluation - Urine ACR  Never done   Hepatitis C Screening  Never done   Hepatitis B Vaccines 19-59 Average Risk (1 of 3 - 19+ 3-dose series) Never done   COVID-19 Vaccine (3 - Pfizer risk series) 07/15/2019   Mammogram  01/10/2020   HPV VACCINES (3 - Risk 3-dose series) 12/16/2020   OPHTHALMOLOGY EXAM  08/26/2022    Colonoscopy  Never done   Cervical Cancer Screening (HPV/Pap Cotest)  02/13/2024   HEMOGLOBIN A1C  06/01/2024   Diabetic kidney evaluation - eGFR measurement  12/02/2024   Medicare Annual Wellness (AWV)  12/30/2024   DTaP/Tdap/Td (2 - Td or Tdap) 05/15/2027   Pneumococcal Vaccine  Completed   Influenza Vaccine  Completed   Meningococcal B Vaccine  Aged Out    Health Maintenance Items Addressed:  Mammogram ordered; Referral to [facility] for pt to have a pap smear  Additional Screening:  Vision Screening: Recommended annual ophthalmology exams for early detection of glaucoma and other disorders of the eye. Is the patient up to date with their annual eye exam?  Yes   Dental Screening: Recommended annual dental exams for proper oral hygiene  Community Resource Referral / Chronic Care Management: CRR required this visit?  No   CCM required this visit?  No   Plan:    I have personally reviewed and noted the following in the patient's chart:   Medical and social history Use of alcohol, tobacco or illicit drugs  Current medications and supplements including opioid prescriptions. Patient is not currently taking opioid prescriptions. Functional ability  and status Nutritional status Physical activity Advanced directives List of other physicians Hospitalizations, surgeries, and ER visits in previous 12 months Vitals Screenings to include cognitive, depression, and falls Referrals and appointments  In addition, I have reviewed and discussed with patient certain preventive protocols, quality metrics, and best practice recommendations. A written personalized care plan for preventive services as well as general preventive health recommendations were provided to patient.   Verdie CHRISTELLA Saba, CMA   12/31/2023   After Visit Summary: (MyChart) Due to this being a telephonic visit, the after visit summary with patients personalized plan was offered to patient via MyChart    Notes: Nothing significant to report at this time.

## 2024-01-06 DIAGNOSIS — F3189 Other bipolar disorder: Secondary | ICD-10-CM | POA: Diagnosis not present

## 2024-01-20 DIAGNOSIS — F3189 Other bipolar disorder: Secondary | ICD-10-CM | POA: Diagnosis not present

## 2024-01-25 ENCOUNTER — Ambulatory Visit (INDEPENDENT_AMBULATORY_CARE_PROVIDER_SITE_OTHER): Payer: Self-pay | Admitting: Physician Assistant

## 2024-01-25 ENCOUNTER — Encounter (INDEPENDENT_AMBULATORY_CARE_PROVIDER_SITE_OTHER): Payer: Self-pay | Admitting: Physician Assistant

## 2024-01-25 VITALS — BP 120/87 | HR 97 | Temp 98.1°F | Ht 63.0 in | Wt 248.0 lb

## 2024-01-25 DIAGNOSIS — R7303 Prediabetes: Secondary | ICD-10-CM

## 2024-01-25 DIAGNOSIS — Z6841 Body Mass Index (BMI) 40.0 and over, adult: Secondary | ICD-10-CM

## 2024-01-25 DIAGNOSIS — M25561 Pain in right knee: Secondary | ICD-10-CM | POA: Diagnosis not present

## 2024-01-25 DIAGNOSIS — R632 Polyphagia: Secondary | ICD-10-CM | POA: Diagnosis not present

## 2024-01-25 DIAGNOSIS — G8929 Other chronic pain: Secondary | ICD-10-CM

## 2024-01-25 DIAGNOSIS — M25562 Pain in left knee: Secondary | ICD-10-CM

## 2024-01-25 DIAGNOSIS — E559 Vitamin D deficiency, unspecified: Secondary | ICD-10-CM

## 2024-01-25 DIAGNOSIS — E538 Deficiency of other specified B group vitamins: Secondary | ICD-10-CM | POA: Diagnosis not present

## 2024-01-25 NOTE — Progress Notes (Unsigned)
 SUBJECTIVE: Discussed the use of AI scribe software for clinical note transcription with the patient, who gave verbal consent to proceed.  Chief Complaint: Obesity  Interim History: She has maintained her weight since her last visit. Down 39 lbs overall TBW loss of 13.6 %  Brenda Hernandez is here to discuss her progress with her obesity treatment plan. She is on the Category 2 Plan and states she is following her eating plan approximately 25 % of the time. She states she is exercising Going to [facility] swimming 25-30 minutes 2 times per week.  The patient is a 48 year old with obesity, prediabetes, and vitamin D  deficiency who presents for follow-up of her obesity treatment plan.  She follows her category two obesity treatment plan approximately 25% of the time. She has been eating more whole foods but is not getting adequate protein and is not drinking enough water. She skips meals and has maintained her weight since the last visit with a total weight loss of 39 pounds, which is a 13.6% total body weight loss.  She is currently on metformin 1000 mg twice a day with meals for prediabetes. Her last hemoglobin A1c was 5.8% in September, which was higher than previous levels. She has cut out cereal from her diet and is focusing on eating omelets with eggs, cheese, and mushrooms. She consumes a Chartered Certified Accountant high protein shake at night and but has been snacking on milk chocolate covered pretzels and Better Cheddar sausages. We discussed working on finding some healthier snack options and provided hand out.   She has been swimming at the [facility] for 25 to 35 minutes two times per week and plans to increase this to three times a week. Great job increasing your activity level!!  She is taking vitamin D  50,000 IU every 14 days. She has been trying to avoid fast food and processed deli meats and is exploring healthier snack options within her budget constraints. OBJECTIVE: Visit Diagnoses: Problem List Items  Addressed This Visit     Pre-diabetes   Low serum vitamin B12   Bilateral chronic knee pain   Morbid obesity (HCC)   Other Visit Diagnoses       Polyphagia    -  Primary     Obesity with chronic bilateral knee pain Obesity with a total weight loss of 39 pounds (13.6% of body weight) since last visit. Chronic bilateral knee pain persists. Exercise regimen includes swimming 25-30 minutes twice a week, with plans to increase to three times a week. Muscle mass has increased, and adipose mass has decreased. Surgery is planned for December, contingent on weight loss. - Continue swimming at least three times a week. - Incorporate knee-strengthening exercises at the gym. - Focus on healthier snack options to aid weight loss. - Aim for further weight loss to meet surgical requirements.  Prediabetes and polyphagia Prediabetes with a hemoglobin A1c of 5.8% as of September 19th, slightly above the goal of under 5.6%. Polyphagia persists, with a preference for high-calorie snacks. Dietary changes include reducing cereal intake and increasing protein intake with omelets. Midnight snacks include milk chocolate-covered pretzels and Better Cheddar's, which are high in calories and saturated fat. - Continue metformin 1000 mg twice daily with meals. - Focus on reducing high-calorie snacks and increasing healthier options. - Consider alternatives to Better Cheddar's and chocolate-covered pretzels, such as [place] frozen Greek yogurt bars or popcorn. - Aim for a hemoglobin A1c under 5.6%.  Vitamin D  deficiency Managed with vitamin D  supplementation. -  Continue vitamin D  50,000 IU every 14 days.  Low serum B12 Continues B 12 500 mcg daily.   Bilateral knee pain/OA She has follow up with orthopedics pending.  Continues to have significant pain issues and limits her mobility.  She is swimming and encouraged to continue as this has improved her body composition with decreased adipose % and increase in  muscle mass.   Vitals Temp: 98.1 F (36.7 C) BP: 120/87 Pulse Rate: 97 SpO2: 97 %   Anthropometric Measurements Height: 5' 3 (1.6 m) Weight: 248 lb (112.5 kg) BMI (Calculated): 43.94 Weight at Last Visit: 248 lb Weight Lost Since Last Visit: 0 Weight Gained Since Last Visit: 0 Starting Weight: 287 lb Total Weight Loss (lbs): 39 lb (17.7 kg) Peak Weight: 303 lb   Body Composition  Body Fat %: 52.5 % Fat Mass (lbs): 130.6 lbs Muscle Mass (lbs): 112.2 lbs Visceral Fat Rating : 16   Other Clinical Data Fasting: No Labs: No Today's Visit #: 43 Starting Date: 03/26/20     ASSESSMENT AND PLAN:  Diet: Brenda Hernandez is currently in the action stage of change. As such, her goal is to continue with weight loss efforts. She has agreed to Category 2 Plan.  Exercise: Brenda Hernandez has been instructed to work up to a goal of 150 minutes of combined cardio and strengthening exercise per week for weight loss and overall health benefits.   Behavior Modification:  We discussed the following Behavioral Modification Strategies today: increasing lean protein intake, decreasing simple carbohydrates, increasing vegetables, increase H2O intake, increase high fiber foods, no skipping meals, meal planning and cooking strategies, better snacking choices, holiday eating strategies, avoiding temptations, and planning for success. We discussed various medication options to help Brenda Hernandez with her weight loss efforts and we both agreed to continue current treatment plan.  Return in about 4 weeks (around 02/22/2024).Brenda Hernandez She was informed of the importance of frequent follow up visits to maximize her success with intensive lifestyle modifications for her multiple health conditions.  Attestation Statements:   Reviewed by clinician on day of visit: allergies, medications, problem list, medical history, surgical history, family history, social history, and previous encounter notes.   Time spent on visit including  pre-visit chart review and post-visit care and charting was 31 minutes.    Brenda Fotopoulos, PA-C

## 2024-01-27 DIAGNOSIS — N6012 Diffuse cystic mastopathy of left breast: Secondary | ICD-10-CM | POA: Diagnosis not present

## 2024-01-27 DIAGNOSIS — R928 Other abnormal and inconclusive findings on diagnostic imaging of breast: Secondary | ICD-10-CM | POA: Diagnosis not present

## 2024-01-27 DIAGNOSIS — Z124 Encounter for screening for malignant neoplasm of cervix: Secondary | ICD-10-CM | POA: Diagnosis not present

## 2024-02-03 DIAGNOSIS — F3181 Bipolar II disorder: Secondary | ICD-10-CM | POA: Diagnosis not present

## 2024-02-04 ENCOUNTER — Other Ambulatory Visit (HOSPITAL_COMMUNITY)
Admission: AD | Admit: 2024-02-04 | Discharge: 2024-02-04 | Disposition: A | Discharge status: Home / Self Care | Attending: Psychology | Admitting: Psychology

## 2024-02-04 DIAGNOSIS — Z79899 Other long term (current) drug therapy: Secondary | ICD-10-CM | POA: Insufficient documentation

## 2024-02-04 DIAGNOSIS — F319 Bipolar disorder, unspecified: Secondary | ICD-10-CM | POA: Insufficient documentation

## 2024-02-04 LAB — CBC WITH DIFFERENTIAL/PLATELET
Abs Immature Granulocytes: 0.02 K/uL (ref 0.00–0.07)
Basophils Absolute: 0 K/uL (ref 0.0–0.1)
Basophils Relative: 0 %
Eosinophils Absolute: 0 K/uL (ref 0.0–0.5)
Eosinophils Relative: 0 %
HCT: 36.9 % (ref 36.0–46.0)
Hemoglobin: 12.7 g/dL (ref 12.0–15.0)
Immature Granulocytes: 0 %
Lymphocytes Relative: 49 %
Lymphs Abs: 3.7 K/uL (ref 0.7–4.0)
MCH: 31.7 pg (ref 26.0–34.0)
MCHC: 34.4 g/dL (ref 30.0–36.0)
MCV: 92 fL (ref 80.0–100.0)
Monocytes Absolute: 0.5 K/uL (ref 0.1–1.0)
Monocytes Relative: 7 %
Neutro Abs: 3.4 K/uL (ref 1.7–7.7)
Neutrophils Relative %: 44 %
Platelets: 241 K/uL (ref 150–400)
RBC: 4.01 MIL/uL (ref 3.87–5.11)
RDW: 16 % — ABNORMAL HIGH (ref 11.5–15.5)
WBC: 7.7 K/uL (ref 4.0–10.5)
nRBC: 0 % (ref 0.0–0.2)

## 2024-02-14 ENCOUNTER — Encounter (INDEPENDENT_AMBULATORY_CARE_PROVIDER_SITE_OTHER): Payer: Self-pay | Admitting: Physician Assistant

## 2024-02-14 ENCOUNTER — Ambulatory Visit (INDEPENDENT_AMBULATORY_CARE_PROVIDER_SITE_OTHER): Payer: Self-pay | Admitting: Physician Assistant

## 2024-02-14 VITALS — BP 122/90 | HR 97 | Temp 98.0°F | Ht 63.0 in | Wt 249.0 lb

## 2024-02-14 DIAGNOSIS — R632 Polyphagia: Secondary | ICD-10-CM | POA: Diagnosis not present

## 2024-02-14 DIAGNOSIS — E559 Vitamin D deficiency, unspecified: Secondary | ICD-10-CM

## 2024-02-14 DIAGNOSIS — Z6841 Body Mass Index (BMI) 40.0 and over, adult: Secondary | ICD-10-CM | POA: Diagnosis not present

## 2024-02-14 DIAGNOSIS — G8929 Other chronic pain: Secondary | ICD-10-CM

## 2024-02-14 DIAGNOSIS — M25561 Pain in right knee: Secondary | ICD-10-CM

## 2024-02-14 DIAGNOSIS — M25562 Pain in left knee: Secondary | ICD-10-CM

## 2024-02-14 DIAGNOSIS — R7303 Prediabetes: Secondary | ICD-10-CM

## 2024-02-14 NOTE — Progress Notes (Signed)
 SUBJECTIVE: Discussed the use of AI scribe software for clinical note transcription with the patient, who gave verbal consent to proceed.  Chief Complaint: Obesity  Interim History: She is up 1 lb since her last visit.  Down 38 lbs overall TBW loss of 13.2%  Brenda Hernandez is here to discuss her progress with her obesity treatment plan. She is on the Category 2 Plan and states she is following her eating plan approximately 45-50 % of the time. She states she is exercising swimming 60 minutes 3 times per week.  Brenda Hernandez is a 48 year old female with obesity who presents for follow-up of her obesity treatment plan.  She has a history of obesity, polyphagia, prediabetes, vitamin D  and B12 deficiency, and chronic bilateral knee pain. Over the past four weeks, she has adhered to a category two weight loss plan with approximately 45 to 50% consistency. Despite a weight gain of one pound during this period, she has achieved a total weight loss of thirty-eight pounds. She is consuming more whole foods, meeting her protein requirements, although she is not consistently drinking enough water. She sleeps seven to nine hours per night.  She has been swimming for sixty minutes three days per week for about a month or two, which she finds beneficial and enjoyable. She swims alone and has made new friends at the pool. She notes increased hunger after swimming and typically eats an omelet with applesauce afterward. She reports that swimming is not hard on her knees and allows her to maintain physical activity.  During Thanksgiving, she indulged in desserts but limited herself to one serving. She plans to manage her diet similarly during Christmas, as she will be staying home with family visiting. She is concerned about potential dietary challenges during the holiday season, particularly with snacks. She is exploring healthier snack options like chickpea puffs and low-calorie snacks and we discussed 100 calorie  and 200 calorie snack options to keep her on track.  She has been consuming meals such as chili with lean ground beef and beans, and a meal of carrots, green beans, cornbread, rice, and chicken. She is mindful of portion sizes, especially with rice. She is working on losing further weight during the holiday season and we discussed strategies for this .  OBJECTIVE: Visit Diagnoses: Problem List Items Addressed This Visit     Pre-diabetes   Vitamin D  deficiency   Low serum vitamin B12   Bilateral chronic knee pain   Morbid obesity (HCC)   Other Visit Diagnoses       Polyphagia    -  Primary     BMI 40.0-44.9, adult (HCC) Current BMI 44.1          Obesity Obesity with a total weight loss of 38 pounds, representing a 13.2% reduction from initial weight. Recent weight gain of 1 pound over the holidays.  Following a category two plan with 45-50% adherence.  Engaging in swimming for 60 minutes, three times a week, which is beneficial for weight management and muscle building. Consuming more whole foods and adequate protein, but not always drinking enough water. Adequate sleep reported. - Continue swimming 60 minutes, three times a week. - Maintain current dietary habits focusing on whole foods and adequate protein intake. - Ensure adequate hydration. - Monitor weight and dietary intake, especially during holidays. -Strategies for continued weight loss during the holidays were provided.   Prediabetes Managed metformin  and with lifestyle modifications including diet and exercise. Current dietary habits include whole  foods and adequate protein intake, which are beneficial for blood sugar control. A1c and insulin  levels not optimized.  Lab Results  Component Value Date   HGBA1C 5.8 12/03/2023   HGBA1C 5.1 06/24/2023   HGBA1C 5.0 04/30/2023   Lab Results  Component Value Date   LDLCALC 103 (H) 04/30/2023   CREATININE 0.72 12/03/2023   INSULIN   Date Value Ref Range Status   06/17/2022 9.9 2.6 - 24.9 uIU/mL Final  ]Continue working on nutrition plan to decrease simple carbohydrates, increase lean proteins and exercise to promote weight loss, improve glycemic control and prevent progression to Type 2 diabetes.  Continue metformin  1000 mg BID per another provider for glycemic control and to counter act some of psychotropics.  - Continue current dietary habits focusing on whole foods and adequate protein intake. - Maintain regular exercise routine.  Polyphagia Managed with dietary modifications and regular exercise. Consuming adequate protein and whole foods, which help in managing hunger and appetite. - Continue dietary modifications focusing on protein and whole foods. - Maintain regular exercise routine. - Strategies for holidays provided.   Vitamin D  Deficiency Vitamin D  is at goal of 50.  Most recent vitamin D  level was 57. She is on  prescription ergocalciferol  50,000 IU every 14 days. Lab Results  Component Value Date   VD25OH 57.26 06/24/2023   VD25OH 80.08 04/30/2023   VD25OH 71.90 03/05/2023    Plan: Continue  prescription ergocalciferol  50,000 IU every 14 days Low vitamin D  levels can be associated with adiposity and may result in leptin resistance and weight gain. Also associated with fatigue.  Currently on vitamin D  supplementation without any adverse effects such as nausea, vomiting or muscle weakness.    Chronic bilateral knee pain Managed with swimming, which is a low-impact exercise beneficial for knee health. Swimming has been ongoing for 1-2 months and is well-tolerated. - Continue swimming as a low-impact exercise to manage knee pain. Vitals Temp: 98 F (36.7 C) BP: (!) 122/90 Pulse Rate: 97 SpO2: 99 %   Anthropometric Measurements Height: 5' 3 (1.6 m) Weight: 249 lb (112.9 kg) BMI (Calculated): 44.12 Weight at Last Visit: 248 lb Weight Lost Since Last Visit: 0 Weight Gained Since Last Visit: 1 lb Starting Weight: 287  lb Total Weight Loss (lbs): 38 lb (17.2 kg) Peak Weight: 303 lb   Body Composition  Body Fat %: 52.8 % Fat Mass (lbs): 131.4 lbs Muscle Mass (lbs): 111.6 lbs Visceral Fat Rating : 17   Other Clinical Data Fasting: No Labs: No Today's Visit #: 57 Starting Date: 03/26/20     ASSESSMENT AND PLAN:  Diet: Brenda Hernandez is currently in the action stage of change. As such, her goal is to continue with weight loss efforts. She has agreed to Category 2 Plan.  Exercise: Brenda Hernandez has been instructed to work up to a goal of 150 minutes of combined cardio and strengthening exercise per week and to continue exercising as is for weight loss and overall health benefits.   Behavior Modification:  We discussed the following Behavioral Modification Strategies today: increasing lean protein intake, decreasing simple carbohydrates, increasing vegetables, increase H2O intake, increase high fiber foods, meal planning and cooking strategies, holiday eating strategies, avoiding temptations, and planning for success. We discussed various medication options to help Brenda Hernandez with her weight loss efforts and we both agreed to continue metformin  per PCP for primary indication of prediabetes.  Return in about 5 weeks (around 03/20/2024).Brenda Hernandez She was informed of the importance of frequent follow up visits  to maximize her success with intensive lifestyle modifications for her multiple health conditions.  Attestation Statements:   Reviewed by clinician on day of visit: allergies, medications, problem list, medical history, surgical history, family history, social history, and previous encounter notes.   Time spent on visit including pre-visit chart review and post-visit care and charting was 38 minutes.    Brenda Ende, PA-C

## 2024-02-15 ENCOUNTER — Ambulatory Visit: Admitting: Internal Medicine

## 2024-02-15 ENCOUNTER — Encounter: Payer: Self-pay | Admitting: Internal Medicine

## 2024-02-15 VITALS — BP 118/76 | HR 93 | Temp 97.7°F | Ht 63.0 in | Wt 253.0 lb

## 2024-02-15 DIAGNOSIS — G8929 Other chronic pain: Secondary | ICD-10-CM

## 2024-02-15 DIAGNOSIS — E538 Deficiency of other specified B group vitamins: Secondary | ICD-10-CM

## 2024-02-15 DIAGNOSIS — M25561 Pain in right knee: Secondary | ICD-10-CM | POA: Diagnosis not present

## 2024-02-15 DIAGNOSIS — Z6841 Body Mass Index (BMI) 40.0 and over, adult: Secondary | ICD-10-CM

## 2024-02-15 DIAGNOSIS — E559 Vitamin D deficiency, unspecified: Secondary | ICD-10-CM

## 2024-02-15 DIAGNOSIS — M25562 Pain in left knee: Secondary | ICD-10-CM | POA: Diagnosis not present

## 2024-02-15 NOTE — Patient Instructions (Signed)
 Blue-Emu cream -use 2-3 times a day Aspercream  Aspercream- Lidocaine 

## 2024-02-15 NOTE — Assessment & Plan Note (Signed)
 On B complex

## 2024-02-15 NOTE — Progress Notes (Signed)
 Subjective:  Patient ID: Brenda Hernandez, female    DOB: [birth year]  Age: 48 y.o. MRN: [id number]  CC: Medical Management of Chronic Issues (3 Month follow up)   HPI Kamie Doescher presents for   Outpatient Medications Prior to Visit  Medication Sig Dispense Refill   aspirin EC 81 MG tablet Take 81 mg by mouth 2 (two) times daily. Swallow whole.     clomiPRAMINE (ANAFRANIL) 75 MG capsule Take 75 mg by mouth 2 (two) times daily.     cloZAPine  (CLOZARIL ) 100 MG tablet Take 5 tablets (500 mg total) by mouth daily. 150 tablet 2   cyanocobalamin  (VITAMIN B12) 500 MCG tablet Take 500 mcg by mouth once a week.     divalproex  (DEPAKOTE  ER) 500 MG 24 hr tablet Take 4 tablets (2,000 mg total) by mouth daily. Take 4 po in the evening     ergocalciferol  (VITAMIN D2) 1.25 MG (50000 UT) capsule Take 50,000 Units by mouth every 14 (fourteen) days.     fluticasone  (FLONASE ) 50 MCG/ACT nasal spray Place 1 spray into both nostrils daily. 16 g 6   levonorgestrel -ethinyl estradiol (VIENVA) 0.1-20 MG-MCG tablet Take 1 tablet by mouth daily.     metFORMIN  (GLUCOPHAGE ) 1000 MG tablet Take 1 tablet (1,000 mg total) by mouth 2 (two) times daily with a meal. 180 tablet 1   No facility-administered medications prior to visit.    ROS: Review of Systems  Constitutional:  Negative for activity change, appetite change, chills, fatigue and unexpected weight change.  HENT:  Negative for congestion, mouth sores and sinus pressure.   Eyes:  Negative for visual disturbance.  Respiratory:  Negative for cough and chest tightness.   Gastrointestinal:  Negative for abdominal pain and nausea.  Genitourinary:  Negative for difficulty urinating, frequency and vaginal pain.  Musculoskeletal:  Positive for arthralgias and gait problem. Negative for back pain.  Skin:  Negative for pallor and rash.  Neurological:  Negative for dizziness, tremors, weakness, numbness and headaches.  Psychiatric/Behavioral:  Negative for confusion  and sleep disturbance.     Objective:  BP 118/76   Pulse 93   Temp 97.7 F (36.5 C)   Ht 5' 3 (1.6 m)   Wt 253 lb (114.8 kg)   SpO2 98%   BMI 44.82 kg/m   BP Readings from Last 3 Encounters:  02/15/24 118/76  02/14/24 (!) 122/90  01/25/24 120/87    Wt Readings from Last 3 Encounters:  02/15/24 253 lb (114.8 kg)  02/14/24 249 lb (112.9 kg)  01/25/24 248 lb (112.5 kg)    Physical Exam Constitutional:      General: She is not in acute distress.    Appearance: She is well-developed. She is obese.  HENT:     Head: Normocephalic.     Right Ear: External ear normal.     Left Ear: External ear normal.     Nose: Nose normal.  Eyes:     General:        Right eye: No discharge.        Left eye: No discharge.     Conjunctiva/sclera: Conjunctivae normal.     Pupils: Pupils are equal, round, and reactive to light.  Neck:     Thyroid : No thyromegaly.     Vascular: No JVD.     Trachea: No tracheal deviation.  Cardiovascular:     Rate and Rhythm: Normal rate and regular rhythm.     Heart sounds: Normal heart sounds.  Pulmonary:  Effort: No respiratory distress.     Breath sounds: No stridor. No wheezing.  Abdominal:     General: Bowel sounds are normal. There is no distension.     Palpations: Abdomen is soft. There is no mass.     Tenderness: There is no abdominal tenderness. There is no guarding or rebound.  Musculoskeletal:        General: No tenderness.     Cervical back: Normal range of motion and neck supple. No rigidity.  Lymphadenopathy:     Cervical: No cervical adenopathy.  Skin:    Findings: No erythema or rash.  Neurological:     Mental Status: She is oriented to person, place, and time.     Cranial Nerves: No cranial nerve deficit.     Motor: No abnormal muscle tone.     Coordination: Coordination normal.     Gait: Gait abnormal.     Deep Tendon Reflexes: Reflexes normal.  Psychiatric:        Behavior: Behavior normal.        Thought Content:  Thought content normal.        Judgment: Judgment normal.   Using a cane  Lab Results  Component Value Date   WBC 7.7 02/04/2024   HGB 12.7 02/04/2024   HCT 36.9 02/04/2024   PLT 241 02/04/2024   GLUCOSE 86 12/03/2023   CHOL 185 04/30/2023   TRIG 125 04/30/2023   HDL 57 04/30/2023   LDLCALC 103 (H) 04/30/2023   ALT 9 12/03/2023   AST 15 12/03/2023   NA 138 12/03/2023   K 4.2 12/03/2023   CL 104 12/03/2023   CREATININE 0.72 12/03/2023   BUN 17 12/03/2023   CO2 25 12/03/2023   TSH 2.467 06/24/2023   HGBA1C 5.8 12/03/2023    No results found.  Assessment & Plan:   Problem List Items Addressed This Visit     Bilateral chronic knee pain - Primary   Goal wt 225 lbs to qualify for surgery L knee pain/OA      BMI 45.0-49.9, adult (HCC)   Goal wt 225 lbs to qualify for surgery L knee pain/OA      Low serum vitamin B12   On B complex      Vitamin D  deficiency   On Vit D         No orders of the defined types were placed in this encounter.     Follow-up: Return in about 3 months (around 05/15/2024) for a follow-up visit.  Marolyn Noel, MD

## 2024-02-15 NOTE — Assessment & Plan Note (Signed)
 On Vit D

## 2024-02-15 NOTE — Assessment & Plan Note (Signed)
Goal wt 225 lbs to qualify for surgery L knee pain/OA

## 2024-02-16 DIAGNOSIS — Z0100 Encounter for examination of eyes and vision without abnormal findings: Secondary | ICD-10-CM | POA: Diagnosis not present

## 2024-02-17 DIAGNOSIS — F3181 Bipolar II disorder: Secondary | ICD-10-CM | POA: Diagnosis not present

## 2024-02-18 ENCOUNTER — Telehealth: Payer: Self-pay

## 2024-02-18 NOTE — Telephone Encounter (Signed)
 Copied from CRM [id number]. Topic: Clinical - Request for Lab/Test Order >> Feb 18, 2024  3:10 PM Lauren C wrote: Reason for CRM: Patient is requesting colon cancer screening, does not know if she is old enough to do the one that is through the mail or if she qualifies for colonoscopy. Requesting call back at [phone].

## 2024-02-22 ENCOUNTER — Telehealth: Payer: Self-pay

## 2024-02-22 NOTE — Telephone Encounter (Signed)
 Yes, the most recent recommendation is to start colon cancer screening at the age of 79 and up.  We can do either a Cologuard home test or a colonoscopy.  Which one which you prefer?  Thanks

## 2024-02-22 NOTE — Telephone Encounter (Signed)
 I was able to speak with the pt about her concerns in the difference with the testing. Pt has agreed to the at home test. Pcp has been notified of the reference for the order.

## 2024-02-22 NOTE — Telephone Encounter (Signed)
 I was able to speak with the Brenda Hernandez about which test she is in preference of and she has stated she would like to do the at home test Cologuard.

## 2024-02-22 NOTE — Telephone Encounter (Signed)
 Copied from CRM #[id number]. Topic: Clinical - Request for Lab/Test Order >> Feb 22, 2024  1:35 PM Mesmerise C wrote: Reason for CRM:  Patient inquiring if decides to go with colonoscopy what all would it entail would like to discuss with a nurse and if referral would be sent over or would be done at office

## 2024-02-25 NOTE — Addendum Note (Signed)
 Addended by: Barbie Croston V on: 02/25/2024 08:53 AM   Modules accepted: Orders

## 2024-02-25 NOTE — Telephone Encounter (Signed)
 OK. Ordered Thx

## 2024-03-03 ENCOUNTER — Other Ambulatory Visit (HOSPITAL_COMMUNITY)
Admission: AD | Admit: 2024-03-03 | Discharge: 2024-03-03 | Disposition: A | Discharge status: Home / Self Care | Attending: Psychology | Admitting: Psychology

## 2024-03-03 DIAGNOSIS — Z79899 Other long term (current) drug therapy: Secondary | ICD-10-CM | POA: Diagnosis not present

## 2024-03-03 DIAGNOSIS — F319 Bipolar disorder, unspecified: Secondary | ICD-10-CM | POA: Diagnosis present

## 2024-03-03 LAB — CBC WITH DIFFERENTIAL/PLATELET
Abs Immature Granulocytes: 0.02 K/uL (ref 0.00–0.07)
Basophils Absolute: 0 K/uL (ref 0.0–0.1)
Basophils Relative: 0 %
Eosinophils Absolute: 0 K/uL (ref 0.0–0.5)
Eosinophils Relative: 0 %
HCT: 38.1 % (ref 36.0–46.0)
Hemoglobin: 13.3 g/dL (ref 12.0–15.0)
Immature Granulocytes: 0 %
Lymphocytes Relative: 49 %
Lymphs Abs: 4.1 K/uL — ABNORMAL HIGH (ref 0.7–4.0)
MCH: 32 pg (ref 26.0–34.0)
MCHC: 34.9 g/dL (ref 30.0–36.0)
MCV: 91.6 fL (ref 80.0–100.0)
Monocytes Absolute: 0.4 K/uL (ref 0.1–1.0)
Monocytes Relative: 5 %
Neutro Abs: 3.9 K/uL (ref 1.7–7.7)
Neutrophils Relative %: 46 %
Platelets: 240 K/uL (ref 150–400)
RBC: 4.16 MIL/uL (ref 3.87–5.11)
RDW: 15.9 % — ABNORMAL HIGH (ref 11.5–15.5)
WBC: 8.4 K/uL (ref 4.0–10.5)
nRBC: 0 % (ref 0.0–0.2)

## 2024-03-06 ENCOUNTER — Telehealth: Payer: Self-pay | Admitting: Internal Medicine

## 2024-03-06 NOTE — Telephone Encounter (Signed)
 Copied from CRM #[id number]. Topic: Referral - Request for Referral >> Mar 03, 2024  5:00 PM Ashley R wrote: Did the patient discuss referral with their provider in the last year? Yes (If No - schedule appointment) (If Yes - send message)  Appointment offered? No, discussed the options previously, does not want to do at home test.   Type of order/referral and detailed reason for visit: Colonoscopy  Preference of office, provider, location: [facility]  If referral order, have you been seen by this specialty before? No (If Yes, this issue or another issue? When? Where?  Can we respond through MyChart? No

## 2024-03-10 NOTE — Telephone Encounter (Signed)
Colonoscopy has been ordered.

## 2024-03-16 LAB — COLOGUARD: COLOGUARD: NEGATIVE

## 2024-03-21 ENCOUNTER — Encounter (INDEPENDENT_AMBULATORY_CARE_PROVIDER_SITE_OTHER): Payer: Self-pay | Admitting: Family Medicine

## 2024-03-21 ENCOUNTER — Ambulatory Visit (INDEPENDENT_AMBULATORY_CARE_PROVIDER_SITE_OTHER): Admitting: Family Medicine

## 2024-03-21 VITALS — BP 119/82 | HR 100 | Temp 97.8°F | Ht 63.0 in | Wt 253.0 lb

## 2024-03-21 DIAGNOSIS — R7303 Prediabetes: Secondary | ICD-10-CM

## 2024-03-21 DIAGNOSIS — E669 Obesity, unspecified: Secondary | ICD-10-CM | POA: Diagnosis not present

## 2024-03-21 DIAGNOSIS — Z6841 Body Mass Index (BMI) 40.0 and over, adult: Secondary | ICD-10-CM | POA: Diagnosis not present

## 2024-03-21 NOTE — Progress Notes (Signed)
 "  SUBJECTIVE:  Chief Complaint: Obesity  Interim History: Patient mentions her holidays were busy and she had quite a bit of food over the holidays.  She is mentions she uses different types of food from each group.   Finds staying on plan to be difficult because of cravings for sweets.  Patient has been eating omelets and normally only eats one meal a day.  She did eat more indulgently over the holidays.  She normally will have snacks throughout the day before and after her meal.  Omelet will be 3 eggs, mushrooms, cheese (1 slice), 1 slice bread.   Feels satisfied from that meal.  Her snack is premier protein.  She was previously doing [employer] chocolate covered pretzels but no longer has those at home. She previously food logged but voices that she has not done as well as she previously did food logging.   Brenda Hernandez is here to discuss her progress with her obesity treatment plan. She is on the Category 2 Plan and states she is following her eating plan approximately 30-45 % of the time. She states she is exercising 60-75 minutes 3 times per week.   OBJECTIVE: Visit Diagnoses: Problem List Items Addressed This Visit   None   Vitals Temp: 97.8 F (36.6 C) BP: 119/82 Pulse Rate: 100 SpO2: 98 %   Anthropometric Measurements Height: 5' 3 (1.6 m) Weight: 253 lb (114.8 kg) BMI (Calculated): 44.83 Weight at Last Visit: 249 lb Weight Lost Since Last Visit: 0 Weight Gained Since Last Visit: 4 Starting Weight: 287 lb Total Weight Loss (lbs): 34 lb (15.4 kg) Peak Weight: 303 lb   Body Composition  Body Fat %: 56.3 % Fat Mass (lbs): 142.6 lbs Muscle Mass (lbs): 105.2 lbs Visceral Fat Rating : 18   Other Clinical Data Today's Visit #: 55 Starting Date: 03/26/20 Comments: Cat 2     ASSESSMENT AND PLAN: Assessment & Plan Pre-diabetes Tolerating current dose of metformin  of 1000mg  twice a day.  No refill needed today. Follow up at next appointment as to how patient has further  tolerated metformin  usage and dose. BMI 40.0-44.9, adult (HCC) Current BMI 44.1  Obesity, Beginning BMI 50.84    Diet: Oak[place] is currently in the action stage of change. As such, her goal is to continue with weight loss efforts and has agreed to keeping a food journal and adhering to recommended goals of 1100-1200 calories and 85 or more grams of protein or Category 2 meal plan.  Patient is needing to increase the frequency of her food intake because she is significantly below her caloric goal and protein goal eating one meal daily and having her snacks.   Exercise:  For substantial health benefits, adults should do at least 150 minutes (2 hours and 30 minutes) a week of moderate-intensity, or 75 minutes (1 hour and 15 minutes) a week of vigorous-intensity aerobic physical activity, or an equivalent combination of moderate- and vigorous-intensity aerobic activity. Aerobic activity should be performed in episodes of at least 10 minutes, and preferably, it should be spread throughout the week.  Behavior Modification:  We discussed the following Behavioral Modification Strategies today: increasing lean protein intake, decreasing simple carbohydrates, increasing vegetables, meal planning and cooking strategies, and planning for success.   Follow up in 4-5 weeks with Shawn.   She was informed of the importance of frequent follow up visits to maximize her success with intensive lifestyle modifications for her multiple health conditions.  Attestation Statements:   Reviewed by clinician  on day of visit: allergies, medications, problem list, medical history, surgical history, family history, social history, and previous encounter notes.     Adelita Cho, MD "

## 2024-03-24 NOTE — Assessment & Plan Note (Signed)
 Tolerating current dose of metformin  of 1000mg  twice a day.  No refill needed today. Follow up at next appointment as to how patient has further tolerated metformin  usage and dose.

## 2024-03-30 ENCOUNTER — Other Ambulatory Visit (HOSPITAL_COMMUNITY)
Admission: AD | Admit: 2024-03-30 | Discharge: 2024-03-30 | Disposition: A | Discharge status: Home / Self Care | Attending: Psychology | Admitting: Psychology

## 2024-03-30 DIAGNOSIS — Z79899 Other long term (current) drug therapy: Secondary | ICD-10-CM | POA: Insufficient documentation

## 2024-03-30 LAB — CBC WITH DIFFERENTIAL/PLATELET
Abs Immature Granulocytes: 0.02 K/uL (ref 0.00–0.07)
Basophils Absolute: 0 K/uL (ref 0.0–0.1)
Basophils Relative: 0 %
Eosinophils Absolute: 0 K/uL (ref 0.0–0.5)
Eosinophils Relative: 0 %
HCT: 39.1 % (ref 36.0–46.0)
Hemoglobin: 13.7 g/dL (ref 12.0–15.0)
Immature Granulocytes: 0 %
Lymphocytes Relative: 50 %
Lymphs Abs: 3.3 K/uL (ref 0.7–4.0)
MCH: 31.9 pg (ref 26.0–34.0)
MCHC: 35 g/dL (ref 30.0–36.0)
MCV: 90.9 fL (ref 80.0–100.0)
Monocytes Absolute: 0.4 K/uL (ref 0.1–1.0)
Monocytes Relative: 6 %
Neutro Abs: 3 K/uL (ref 1.7–7.7)
Neutrophils Relative %: 44 %
Platelets: 238 K/uL (ref 150–400)
RBC: 4.3 MIL/uL (ref 3.87–5.11)
RDW: 15.7 % — ABNORMAL HIGH (ref 11.5–15.5)
WBC: 6.7 K/uL (ref 4.0–10.5)
nRBC: 0 % (ref 0.0–0.2)

## 2024-04-25 ENCOUNTER — Ambulatory Visit (INDEPENDENT_AMBULATORY_CARE_PROVIDER_SITE_OTHER): Admitting: Physician Assistant

## 2024-05-23 ENCOUNTER — Ambulatory Visit (INDEPENDENT_AMBULATORY_CARE_PROVIDER_SITE_OTHER): Admitting: Physician Assistant

## 2024-06-20 ENCOUNTER — Ambulatory Visit: Admitting: Internal Medicine

## 2025-01-02 ENCOUNTER — Encounter: Admitting: Internal Medicine

## 2025-01-02 ENCOUNTER — Ambulatory Visit
